# Patient Record
Sex: Female | Born: 1955 | Race: White | Hispanic: No | Marital: Single | Smoking: Former smoker
Health system: Southern US, Community
[De-identification: ages and names within clinical notes are randomized; demographics above are authoritative.]

## PROBLEM LIST (undated history)

## (undated) DIAGNOSIS — D649 Anemia, unspecified: Secondary | ICD-10-CM

## (undated) DIAGNOSIS — J449 Chronic obstructive pulmonary disease, unspecified: Secondary | ICD-10-CM

## (undated) DIAGNOSIS — F333 Major depressive disorder, recurrent, severe with psychotic symptoms: Secondary | ICD-10-CM

## (undated) DIAGNOSIS — M150 Primary generalized (osteo)arthritis: Secondary | ICD-10-CM

## (undated) DIAGNOSIS — Z9981 Dependence on supplemental oxygen: Secondary | ICD-10-CM

## (undated) DIAGNOSIS — E782 Mixed hyperlipidemia: Secondary | ICD-10-CM

## (undated) DIAGNOSIS — E039 Hypothyroidism, unspecified: Secondary | ICD-10-CM

## (undated) DIAGNOSIS — J4489 Other specified chronic obstructive pulmonary disease: Secondary | ICD-10-CM

## (undated) DIAGNOSIS — I10 Essential (primary) hypertension: Secondary | ICD-10-CM

## (undated) DIAGNOSIS — E119 Type 2 diabetes mellitus without complications: Secondary | ICD-10-CM

## (undated) DIAGNOSIS — I509 Heart failure, unspecified: Secondary | ICD-10-CM

## (undated) DIAGNOSIS — I872 Venous insufficiency (chronic) (peripheral): Secondary | ICD-10-CM

## (undated) DIAGNOSIS — G4733 Obstructive sleep apnea (adult) (pediatric): Secondary | ICD-10-CM

## (undated) DIAGNOSIS — I259 Chronic ischemic heart disease, unspecified: Secondary | ICD-10-CM

## (undated) DIAGNOSIS — M109 Gout, unspecified: Secondary | ICD-10-CM

## (undated) HISTORY — DX: Mixed hyperlipidemia: E78.2

## (undated) HISTORY — DX: Gout, unspecified: M10.9

## (undated) HISTORY — DX: Primary generalized (osteo)arthritis: M15.0

## (undated) HISTORY — DX: Chronic obstructive pulmonary disease, unspecified: J44.9

## (undated) HISTORY — DX: Anemia, unspecified: D64.9

## (undated) HISTORY — DX: Hypothyroidism, unspecified: E03.9

## (undated) HISTORY — DX: Dependence on supplemental oxygen: Z99.81

## (undated) HISTORY — DX: Chronic ischemic heart disease, unspecified: I25.9

## (undated) HISTORY — DX: Venous insufficiency (chronic) (peripheral): I87.2

## (undated) HISTORY — DX: Other specified chronic obstructive pulmonary disease: J44.89

## (undated) HISTORY — DX: Major depressive disorder, recurrent, severe with psychotic symptoms: F33.3

## (undated) HISTORY — DX: Obstructive sleep apnea (adult) (pediatric): G47.33

## (undated) HISTORY — PX: TONSILLECTOMY: SUR1361

---

## 2004-10-16 ENCOUNTER — Ambulatory Visit: Payer: Self-pay | Admitting: Oncology

## 2004-12-18 ENCOUNTER — Ambulatory Visit: Payer: Self-pay | Admitting: Internal Medicine

## 2004-12-21 ENCOUNTER — Ambulatory Visit: Payer: Self-pay | Admitting: Internal Medicine

## 2005-01-03 ENCOUNTER — Ambulatory Visit: Payer: Self-pay | Admitting: Oncology

## 2005-03-06 ENCOUNTER — Ambulatory Visit: Payer: Self-pay | Admitting: Oncology

## 2005-04-10 ENCOUNTER — Ambulatory Visit: Payer: Self-pay

## 2005-04-17 ENCOUNTER — Ambulatory Visit: Payer: Self-pay | Admitting: Oncology

## 2005-06-07 ENCOUNTER — Inpatient Hospital Stay: Payer: Self-pay | Admitting: Internal Medicine

## 2005-06-19 ENCOUNTER — Ambulatory Visit: Payer: Self-pay | Admitting: Oncology

## 2005-07-11 LAB — CBC WITH DIFFERENTIAL (CANCER CENTER ONLY)
BASO%: 0.6 % (ref 0.0–2.0)
EOS%: 2.1 % (ref 0.0–7.0)
LYMPH#: 0.8 10*3/uL — ABNORMAL LOW (ref 0.9–3.3)
MCH: 28.3 pg (ref 26.0–34.0)
MCHC: 33 g/dL (ref 32.0–36.0)
MONO%: 5.8 % (ref 0.0–13.0)
NEUT#: 4.3 10*3/uL (ref 1.5–6.5)
RDW: 16.1 % — ABNORMAL HIGH (ref 10.5–14.6)

## 2005-08-16 ENCOUNTER — Ambulatory Visit: Payer: Self-pay | Admitting: Oncology

## 2005-08-30 LAB — CBC WITH DIFFERENTIAL (CANCER CENTER ONLY)
Eosinophils Absolute: 0.2 10*3/uL (ref 0.0–0.5)
HCT: 31.3 % — ABNORMAL LOW (ref 34.8–46.6)
LYMPH%: 13.6 % — ABNORMAL LOW (ref 14.0–48.0)
MCV: 88 fL (ref 81–101)
MONO#: 0.2 10*3/uL (ref 0.1–0.9)
Platelets: 145 10*3/uL (ref 145–400)
RBC: 3.56 10*6/uL — ABNORMAL LOW (ref 3.70–5.32)
WBC: 6.5 10*3/uL (ref 3.9–10.0)

## 2005-09-20 LAB — CBC WITH DIFFERENTIAL (CANCER CENTER ONLY)
BASO#: 0 10*3/uL (ref 0.0–0.2)
BASO%: 0.3 % (ref 0.0–2.0)
Eosinophils Absolute: 0.1 10*3/uL (ref 0.0–0.5)
HCT: 28 % — ABNORMAL LOW (ref 34.8–46.6)
LYMPH%: 14 % (ref 14.0–48.0)
MCV: 85 fL (ref 81–101)
MONO#: 0.2 10*3/uL (ref 0.1–0.9)
NEUT%: 79.5 % (ref 39.6–80.0)
RDW: 17.5 % — ABNORMAL HIGH (ref 10.5–14.6)
WBC: 5 10*3/uL (ref 3.9–10.0)

## 2005-10-10 ENCOUNTER — Ambulatory Visit: Payer: Self-pay | Admitting: Oncology

## 2005-10-11 LAB — CBC WITH DIFFERENTIAL (CANCER CENTER ONLY)
BASO#: 0 10*3/uL (ref 0.0–0.2)
BASO%: 0.4 % (ref 0.0–2.0)
EOS%: 3.2 % (ref 0.0–7.0)
HGB: 9.9 g/dL — ABNORMAL LOW (ref 11.6–15.9)
MCH: 27.1 pg (ref 26.0–34.0)
MCHC: 32.1 g/dL (ref 32.0–36.0)
MONO%: 5.3 % (ref 0.0–13.0)
NEUT#: 4 10*3/uL (ref 1.5–6.5)
NEUT%: 76 % (ref 39.6–80.0)
RDW: 17.2 % — ABNORMAL HIGH (ref 10.5–14.6)

## 2005-11-01 LAB — CBC WITH DIFFERENTIAL (CANCER CENTER ONLY)
EOS%: 2.2 % (ref 0.0–7.0)
MCH: 27.1 pg (ref 26.0–34.0)
MCHC: 31.8 g/dL — ABNORMAL LOW (ref 32.0–36.0)
MONO%: 3.9 % (ref 0.0–13.0)
NEUT#: 4.5 10*3/uL (ref 1.5–6.5)
Platelets: 160 10*3/uL (ref 145–400)
RBC: 3.62 10*6/uL — ABNORMAL LOW (ref 3.70–5.32)

## 2005-11-21 LAB — LACTATE DEHYDROGENASE: LDH: 169 U/L (ref 94–250)

## 2005-11-21 LAB — CBC WITH DIFFERENTIAL (CANCER CENTER ONLY)
Eosinophils Absolute: 0.1 10*3/uL (ref 0.0–0.5)
LYMPH#: 0.6 10*3/uL — ABNORMAL LOW (ref 0.9–3.3)
MONO#: 0.2 10*3/uL (ref 0.1–0.9)
MONO%: 3.3 % (ref 0.0–13.0)
NEUT#: 3.9 10*3/uL (ref 1.5–6.5)
Platelets: 142 10*3/uL — ABNORMAL LOW (ref 145–400)
RBC: 3.55 10*6/uL — ABNORMAL LOW (ref 3.70–5.32)
WBC: 4.8 10*3/uL (ref 3.9–10.0)

## 2005-11-21 LAB — BASIC METABOLIC PANEL
BUN: 15 mg/dL (ref 6–23)
Calcium: 8.6 mg/dL (ref 8.4–10.5)
Creatinine, Ser: 0.66 mg/dL (ref 0.40–1.20)
Potassium: 4 mEq/L (ref 3.5–5.3)

## 2005-11-21 LAB — IRON AND TIBC
%SAT: 11 % — ABNORMAL LOW (ref 20–55)
Iron: 43 ug/dL (ref 42–145)

## 2005-12-10 ENCOUNTER — Ambulatory Visit: Payer: Self-pay | Admitting: Oncology

## 2005-12-17 LAB — CBC WITH DIFFERENTIAL (CANCER CENTER ONLY)
BASO#: 0 10*3/uL (ref 0.0–0.2)
EOS%: 1 % (ref 0.0–7.0)
HCT: 37.2 % (ref 34.8–46.6)
HGB: 12 g/dL (ref 11.6–15.9)
LYMPH#: 1 10*3/uL (ref 0.9–3.3)
LYMPH%: 10.1 % — ABNORMAL LOW (ref 14.0–48.0)
MCH: 27.4 pg (ref 26.0–34.0)
MCHC: 32.3 g/dL (ref 32.0–36.0)
MCV: 85 fL (ref 81–101)
NEUT%: 83.9 % — ABNORMAL HIGH (ref 39.6–80.0)

## 2006-01-03 LAB — CBC WITH DIFFERENTIAL (CANCER CENTER ONLY)
BASO#: 0 10*3/uL (ref 0.0–0.2)
EOS%: 2.5 % (ref 0.0–7.0)
MCH: 28.1 pg (ref 26.0–34.0)
MCHC: 33.3 g/dL (ref 32.0–36.0)
MONO%: 4.9 % (ref 0.0–13.0)
NEUT#: 3.5 10*3/uL (ref 1.5–6.5)
Platelets: 142 10*3/uL — ABNORMAL LOW (ref 145–400)
RDW: 16.9 % — ABNORMAL HIGH (ref 10.5–14.6)

## 2006-01-24 LAB — CBC WITH DIFFERENTIAL (CANCER CENTER ONLY)
BASO#: 0.1 10*3/uL (ref 0.0–0.2)
BASO%: 0.7 % (ref 0.0–2.0)
Eosinophils Absolute: 0.1 10*3/uL (ref 0.0–0.5)
HCT: 37.5 % (ref 34.8–46.6)
HGB: 12.4 g/dL (ref 11.6–15.9)
LYMPH#: 1.4 10*3/uL (ref 0.9–3.3)
LYMPH%: 14.6 % (ref 14.0–48.0)
MCV: 86 fL (ref 81–101)
MONO#: 0.6 10*3/uL (ref 0.1–0.9)
NEUT%: 77 % (ref 39.6–80.0)
WBC: 9.3 10*3/uL (ref 3.9–10.0)

## 2006-02-13 ENCOUNTER — Ambulatory Visit: Payer: Self-pay | Admitting: Oncology

## 2006-02-14 LAB — CBC WITH DIFFERENTIAL (CANCER CENTER ONLY)
BASO#: 0 10*3/uL (ref 0.0–0.2)
EOS%: 1.8 % (ref 0.0–7.0)
HCT: 32.9 % — ABNORMAL LOW (ref 34.8–46.6)
HGB: 11.1 g/dL — ABNORMAL LOW (ref 11.6–15.9)
LYMPH%: 18.4 % (ref 14.0–48.0)
MCH: 29.5 pg (ref 26.0–34.0)
MCHC: 33.8 g/dL (ref 32.0–36.0)
MONO%: 4.1 % (ref 0.0–13.0)
NEUT%: 75.3 % (ref 39.6–80.0)

## 2006-02-15 LAB — IRON AND TIBC
TIBC: 370 ug/dL (ref 250–470)
UIBC: 293 ug/dL

## 2006-03-28 LAB — CBC WITH DIFFERENTIAL (CANCER CENTER ONLY)
BASO%: 0.6 % (ref 0.0–2.0)
EOS%: 1.9 % (ref 0.0–7.0)
HCT: 32.8 % — ABNORMAL LOW (ref 34.8–46.6)
LYMPH%: 14.6 % (ref 14.0–48.0)
MCH: 31.1 pg (ref 26.0–34.0)
MCHC: 34 g/dL (ref 32.0–36.0)
MCV: 91 fL (ref 81–101)
MONO%: 6.5 % (ref 0.0–13.0)
NEUT%: 76.4 % (ref 39.6–80.0)
RDW: 15.8 % — ABNORMAL HIGH (ref 10.5–14.6)

## 2006-04-17 ENCOUNTER — Ambulatory Visit: Payer: Self-pay | Admitting: Oncology

## 2006-04-18 LAB — CBC WITH DIFFERENTIAL (CANCER CENTER ONLY)
BASO#: 0.1 10*3/uL (ref 0.0–0.2)
EOS%: 2.6 % (ref 0.0–7.0)
HGB: 11 g/dL — ABNORMAL LOW (ref 11.6–15.9)
LYMPH#: 1 10*3/uL (ref 0.9–3.3)
MCHC: 33.2 g/dL (ref 32.0–36.0)
MONO%: 5.4 % (ref 0.0–13.0)
NEUT#: 4.3 10*3/uL (ref 1.5–6.5)
Platelets: 202 10*3/uL (ref 145–400)
RBC: 3.57 10*6/uL — ABNORMAL LOW (ref 3.70–5.32)

## 2006-05-30 LAB — CBC WITH DIFFERENTIAL (CANCER CENTER ONLY)
BASO#: 0.1 10*3/uL (ref 0.0–0.2)
Eosinophils Absolute: 0.2 10*3/uL (ref 0.0–0.5)
HGB: 11.4 g/dL — ABNORMAL LOW (ref 11.6–15.9)
LYMPH#: 1.7 10*3/uL (ref 0.9–3.3)
MCH: 30.8 pg (ref 26.0–34.0)
MONO#: 0.4 10*3/uL (ref 0.1–0.9)
MONO%: 4.6 % (ref 0.0–13.0)
NEUT#: 6.6 10*3/uL — ABNORMAL HIGH (ref 1.5–6.5)
Platelets: 205 10*3/uL (ref 145–400)
RBC: 3.69 10*6/uL — ABNORMAL LOW (ref 3.70–5.32)
WBC: 9 10*3/uL (ref 3.9–10.0)

## 2006-06-18 ENCOUNTER — Ambulatory Visit: Payer: Self-pay | Admitting: Oncology

## 2006-07-17 LAB — CBC WITH DIFFERENTIAL (CANCER CENTER ONLY)
BASO#: 0.1 10*3/uL (ref 0.0–0.2)
EOS%: 2.7 % (ref 0.0–7.0)
MCH: 29.7 pg (ref 26.0–34.0)
MCHC: 34.2 g/dL (ref 32.0–36.0)
MONO%: 5.4 % (ref 0.0–13.0)
NEUT#: 6 10*3/uL (ref 1.5–6.5)
Platelets: 208 10*3/uL (ref 145–400)
RDW: 15.1 % — ABNORMAL HIGH (ref 10.5–14.6)

## 2006-08-06 ENCOUNTER — Ambulatory Visit: Payer: Self-pay | Admitting: Oncology

## 2006-08-07 LAB — CBC WITH DIFFERENTIAL (CANCER CENTER ONLY)
EOS%: 2.2 % (ref 0.0–7.0)
Eosinophils Absolute: 0.2 10*3/uL (ref 0.0–0.5)
MCH: 29.3 pg (ref 26.0–34.0)
MONO%: 4.5 % (ref 0.0–13.0)
NEUT#: 5.1 10*3/uL (ref 1.5–6.5)
Platelets: 180 10*3/uL (ref 145–400)
RBC: 3.36 10*6/uL — ABNORMAL LOW (ref 3.70–5.32)

## 2006-08-27 ENCOUNTER — Emergency Department: Payer: Self-pay | Admitting: Internal Medicine

## 2006-09-01 LAB — CBC WITH DIFFERENTIAL (CANCER CENTER ONLY)
Eosinophils Absolute: 0.2 10*3/uL (ref 0.0–0.5)
LYMPH#: 1.3 10*3/uL (ref 0.9–3.3)
MCV: 88 fL (ref 81–101)
MONO#: 0.4 10*3/uL (ref 0.1–0.9)
NEUT#: 5.1 10*3/uL (ref 1.5–6.5)
Platelets: 212 10*3/uL (ref 145–400)
RBC: 3.5 10*6/uL — ABNORMAL LOW (ref 3.70–5.32)
WBC: 7 10*3/uL (ref 3.9–10.0)

## 2006-10-08 ENCOUNTER — Encounter: Payer: Self-pay | Admitting: Internal Medicine

## 2006-10-08 ENCOUNTER — Ambulatory Visit: Payer: Self-pay | Admitting: Oncology

## 2006-10-09 LAB — CBC WITH DIFFERENTIAL (CANCER CENTER ONLY)
BASO#: 0.1 10*3/uL (ref 0.0–0.2)
HCT: 32.9 % — ABNORMAL LOW (ref 34.8–46.6)
HGB: 11 g/dL — ABNORMAL LOW (ref 11.6–15.9)
LYMPH#: 1.1 10*3/uL (ref 0.9–3.3)
MCHC: 33.3 g/dL (ref 32.0–36.0)
MCV: 88 fL (ref 81–101)
MONO#: 0.3 10*3/uL (ref 0.1–0.9)
NEUT%: 76.8 % (ref 39.6–80.0)
WBC: 6.6 10*3/uL (ref 3.9–10.0)

## 2006-10-27 ENCOUNTER — Encounter: Payer: Self-pay | Admitting: Internal Medicine

## 2006-10-30 LAB — CBC WITH DIFFERENTIAL (CANCER CENTER ONLY)
BASO#: 0 10*3/uL (ref 0.0–0.2)
EOS%: 2.3 % (ref 0.0–7.0)
HCT: 32.2 % — ABNORMAL LOW (ref 34.8–46.6)
HGB: 10.8 g/dL — ABNORMAL LOW (ref 11.6–15.9)
LYMPH%: 19.2 % (ref 14.0–48.0)
MCH: 29.4 pg (ref 26.0–34.0)
MCHC: 33.5 g/dL (ref 32.0–36.0)
MONO%: 4.7 % (ref 0.0–13.0)
NEUT%: 73.4 % (ref 39.6–80.0)

## 2006-12-10 ENCOUNTER — Ambulatory Visit: Payer: Self-pay | Admitting: Oncology

## 2007-01-01 LAB — CBC WITH DIFFERENTIAL (CANCER CENTER ONLY)
BASO#: 0.1 10*3/uL (ref 0.0–0.2)
BASO%: 0.8 % (ref 0.0–2.0)
EOS%: 2.3 % (ref 0.0–7.0)
HCT: 26.4 % — ABNORMAL LOW (ref 34.8–46.6)
LYMPH#: 1 10*3/uL (ref 0.9–3.3)
LYMPH%: 17.1 % (ref 14.0–48.0)
MCH: 29.3 pg (ref 26.0–34.0)
MCHC: 33.6 g/dL (ref 32.0–36.0)
MONO%: 3.9 % (ref 0.0–13.0)
NEUT%: 75.9 % (ref 39.6–80.0)
RDW: 15 % — ABNORMAL HIGH (ref 10.5–14.6)

## 2007-01-21 LAB — CBC WITH DIFFERENTIAL (CANCER CENTER ONLY)
BASO%: 0.6 % (ref 0.0–2.0)
EOS%: 2.7 % (ref 0.0–7.0)
HGB: 10 g/dL — ABNORMAL LOW (ref 11.6–15.9)
LYMPH#: 1 10*3/uL (ref 0.9–3.3)
MCH: 28.8 pg (ref 26.0–34.0)
MCHC: 32.7 g/dL (ref 32.0–36.0)
MONO%: 5.6 % (ref 0.0–13.0)
NEUT#: 4.9 10*3/uL (ref 1.5–6.5)
Platelets: 192 10*3/uL (ref 145–400)
RDW: 16 % — ABNORMAL HIGH (ref 10.5–14.6)

## 2007-02-04 ENCOUNTER — Ambulatory Visit: Payer: Self-pay | Admitting: Oncology

## 2007-03-02 LAB — CBC WITH DIFFERENTIAL (CANCER CENTER ONLY)
Eosinophils Absolute: 0.1 10*3/uL (ref 0.0–0.5)
HCT: 32.5 % — ABNORMAL LOW (ref 34.8–46.6)
HGB: 10.9 g/dL — ABNORMAL LOW (ref 11.6–15.9)
LYMPH%: 15.5 % (ref 14.0–48.0)
MCV: 86 fL (ref 81–101)
MONO#: 0.4 10*3/uL (ref 0.1–0.9)
NEUT%: 76.5 % (ref 39.6–80.0)
RBC: 3.76 10*6/uL (ref 3.70–5.32)
WBC: 6.6 10*3/uL (ref 3.9–10.0)

## 2007-03-02 LAB — IRON AND TIBC
%SAT: 21 % (ref 20–55)
TIBC: 315 ug/dL (ref 250–470)

## 2007-03-02 LAB — FERRITIN: Ferritin: 83 ng/mL (ref 10–291)

## 2007-03-18 ENCOUNTER — Ambulatory Visit: Payer: Self-pay | Admitting: Oncology

## 2007-03-23 LAB — CBC WITH DIFFERENTIAL (CANCER CENTER ONLY)
BASO#: 0 10*3/uL (ref 0.0–0.2)
BASO%: 0.5 % (ref 0.0–2.0)
EOS%: 2.2 % (ref 0.0–7.0)
HCT: 31.3 % — ABNORMAL LOW (ref 34.8–46.6)
HGB: 10.4 g/dL — ABNORMAL LOW (ref 11.6–15.9)
LYMPH%: 14.8 % (ref 14.0–48.0)
MCHC: 33 g/dL (ref 32.0–36.0)
MCV: 87 fL (ref 81–101)
MONO#: 0.4 10*3/uL (ref 0.1–0.9)
NEUT%: 77.1 % (ref 39.6–80.0)
RDW: 15.9 % — ABNORMAL HIGH (ref 10.5–14.6)

## 2007-04-20 ENCOUNTER — Ambulatory Visit: Payer: Self-pay | Admitting: Family Medicine

## 2007-04-30 ENCOUNTER — Ambulatory Visit: Payer: Self-pay | Admitting: Oncology

## 2007-05-04 LAB — CBC WITH DIFFERENTIAL (CANCER CENTER ONLY)
BASO%: 1 % (ref 0.0–2.0)
Eosinophils Absolute: 0.2 10*3/uL (ref 0.0–0.5)
HCT: 31.3 % — ABNORMAL LOW (ref 34.8–46.6)
HGB: 10.5 g/dL — ABNORMAL LOW (ref 11.6–15.9)
LYMPH#: 1.1 10*3/uL (ref 0.9–3.3)
MCV: 86 fL (ref 81–101)
MONO#: 0.4 10*3/uL (ref 0.1–0.9)
NEUT%: 77.2 % (ref 39.6–80.0)
RBC: 3.65 10*6/uL — ABNORMAL LOW (ref 3.70–5.32)
RDW: 15.4 % — ABNORMAL HIGH (ref 10.5–14.6)
WBC: 7.8 10*3/uL (ref 3.9–10.0)

## 2007-06-11 ENCOUNTER — Ambulatory Visit: Payer: Self-pay | Admitting: Oncology

## 2007-06-15 LAB — CBC WITH DIFFERENTIAL (CANCER CENTER ONLY)
BASO#: 0 10*3/uL (ref 0.0–0.2)
EOS%: 2 % (ref 0.0–7.0)
HCT: 34.9 % (ref 34.8–46.6)
HGB: 11.5 g/dL — ABNORMAL LOW (ref 11.6–15.9)
LYMPH#: 1.1 10*3/uL (ref 0.9–3.3)
LYMPH%: 15.2 % (ref 14.0–48.0)
MCHC: 32.9 g/dL (ref 32.0–36.0)
MCV: 85 fL (ref 81–101)
MONO#: 0.3 10*3/uL (ref 0.1–0.9)
NEUT%: 78.2 % (ref 39.6–80.0)

## 2007-07-24 ENCOUNTER — Ambulatory Visit: Payer: Self-pay | Admitting: Oncology

## 2007-07-27 LAB — CBC WITH DIFFERENTIAL (CANCER CENTER ONLY)
BASO#: 0 10*3/uL (ref 0.0–0.2)
EOS%: 2.4 % (ref 0.0–7.0)
Eosinophils Absolute: 0.2 10*3/uL (ref 0.0–0.5)
HCT: 32.2 % — ABNORMAL LOW (ref 34.8–46.6)
HGB: 11 g/dL — ABNORMAL LOW (ref 11.6–15.9)
MCH: 29.1 pg (ref 26.0–34.0)
MCHC: 34 g/dL (ref 32.0–36.0)
MCV: 86 fL (ref 81–101)
MONO%: 5.3 % (ref 0.0–13.0)
NEUT%: 77.5 % (ref 39.6–80.0)
RBC: 3.76 10*6/uL (ref 3.70–5.32)

## 2007-08-17 LAB — CBC WITH DIFFERENTIAL (CANCER CENTER ONLY)
BASO#: 0 10*3/uL (ref 0.0–0.2)
Eosinophils Absolute: 0.1 10*3/uL (ref 0.0–0.5)
HCT: 33 % — ABNORMAL LOW (ref 34.8–46.6)
HGB: 11.4 g/dL — ABNORMAL LOW (ref 11.6–15.9)
LYMPH#: 1.1 10*3/uL (ref 0.9–3.3)
MCHC: 34.6 g/dL (ref 32.0–36.0)
MONO#: 0.3 10*3/uL (ref 0.1–0.9)
NEUT#: 4.7 10*3/uL (ref 1.5–6.5)
NEUT%: 76.2 % (ref 39.6–80.0)
RBC: 3.82 10*6/uL (ref 3.70–5.32)
WBC: 6.1 10*3/uL (ref 3.9–10.0)

## 2007-08-17 LAB — IRON AND TIBC: Iron: 67 ug/dL (ref 42–145)

## 2007-08-17 LAB — FERRITIN: Ferritin: 78 ng/mL (ref 10–291)

## 2007-09-03 ENCOUNTER — Ambulatory Visit: Payer: Self-pay | Admitting: Oncology

## 2007-09-07 LAB — CBC WITH DIFFERENTIAL (CANCER CENTER ONLY)
BASO%: 0.4 % (ref 0.0–2.0)
EOS%: 2.3 % (ref 0.0–7.0)
Eosinophils Absolute: 0.1 10*3/uL (ref 0.0–0.5)
LYMPH%: 16.7 % (ref 14.0–48.0)
MCH: 28.8 pg (ref 26.0–34.0)
MCV: 87 fL (ref 81–101)
MONO%: 4.7 % (ref 0.0–13.0)
NEUT#: 4.5 10*3/uL (ref 1.5–6.5)
Platelets: 157 10*3/uL (ref 145–400)
RBC: 3.76 10*6/uL (ref 3.70–5.32)
RDW: 16.2 % — ABNORMAL HIGH (ref 10.5–14.6)
WBC: 6 10*3/uL (ref 3.9–10.0)

## 2007-09-28 LAB — CBC WITH DIFFERENTIAL (CANCER CENTER ONLY)
BASO%: 0.4 % (ref 0.0–2.0)
EOS%: 1.8 % (ref 0.0–7.0)
LYMPH#: 1.1 10*3/uL (ref 0.9–3.3)
LYMPH%: 13.5 % — ABNORMAL LOW (ref 14.0–48.0)
MONO#: 0.4 10*3/uL (ref 0.1–0.9)
Platelets: 204 10*3/uL (ref 145–400)
RDW: 15.1 % — ABNORMAL HIGH (ref 10.5–14.6)
WBC: 8 10*3/uL (ref 3.9–10.0)

## 2007-10-16 ENCOUNTER — Ambulatory Visit: Payer: Self-pay | Admitting: Oncology

## 2007-10-19 LAB — CBC WITH DIFFERENTIAL (CANCER CENTER ONLY)
BASO#: 0 10*3/uL (ref 0.0–0.2)
BASO%: 0.4 % (ref 0.0–2.0)
EOS%: 2.1 % (ref 0.0–7.0)
Eosinophils Absolute: 0.2 10*3/uL (ref 0.0–0.5)
HCT: 33.2 % — ABNORMAL LOW (ref 34.8–46.6)
HGB: 11 g/dL — ABNORMAL LOW (ref 11.6–15.9)
LYMPH#: 1.1 10*3/uL (ref 0.9–3.3)
LYMPH%: 14.7 % (ref 14.0–48.0)
MCH: 28.8 pg (ref 26.0–34.0)
MCHC: 33.2 g/dL (ref 32.0–36.0)
MCV: 87 fL (ref 81–101)
MONO#: 0.3 10*3/uL (ref 0.1–0.9)
MONO%: 4 % (ref 0.0–13.0)
NEUT#: 5.7 10*3/uL (ref 1.5–6.5)
NEUT%: 78.8 % (ref 39.6–80.0)
Platelets: 179 10*3/uL (ref 145–400)
RBC: 3.83 10*6/uL (ref 3.70–5.32)
RDW: 15.7 % — ABNORMAL HIGH (ref 10.5–14.6)
WBC: 7.2 10*3/uL (ref 3.9–10.0)

## 2007-11-09 LAB — CBC WITH DIFFERENTIAL (CANCER CENTER ONLY)
BASO%: 0.5 % (ref 0.0–2.0)
HCT: 35.1 % (ref 34.8–46.6)
LYMPH#: 1 10*3/uL (ref 0.9–3.3)
MONO#: 0.3 10*3/uL (ref 0.1–0.9)
NEUT#: 4.2 10*3/uL (ref 1.5–6.5)
NEUT%: 73.9 % (ref 39.6–80.0)
RDW: 15.7 % — ABNORMAL HIGH (ref 10.5–14.6)
WBC: 5.6 10*3/uL (ref 3.9–10.0)

## 2007-11-30 ENCOUNTER — Ambulatory Visit: Payer: Self-pay | Admitting: Oncology

## 2007-12-01 LAB — CBC WITH DIFFERENTIAL (CANCER CENTER ONLY)
BASO#: 0 10*3/uL (ref 0.0–0.2)
EOS%: 2.6 % (ref 0.0–7.0)
HGB: 12.3 g/dL (ref 11.6–15.9)
MCH: 29.7 pg (ref 26.0–34.0)
MCHC: 34.2 g/dL (ref 32.0–36.0)
MONO%: 4.3 % (ref 0.0–13.0)
NEUT#: 4.1 10*3/uL (ref 1.5–6.5)
Platelets: 171 10*3/uL (ref 145–400)
RDW: 15.8 % — ABNORMAL HIGH (ref 10.5–14.6)

## 2007-12-21 LAB — CBC WITH DIFFERENTIAL (CANCER CENTER ONLY)
BASO#: 0.1 10*3/uL (ref 0.0–0.2)
EOS%: 2 % (ref 0.0–7.0)
Eosinophils Absolute: 0.1 10*3/uL (ref 0.0–0.5)
LYMPH%: 18.5 % (ref 14.0–48.0)
MCH: 29.6 pg (ref 26.0–34.0)
MCHC: 34.1 g/dL (ref 32.0–36.0)
MCV: 87 fL (ref 81–101)
MONO%: 4.3 % (ref 0.0–13.0)
NEUT#: 5.1 10*3/uL (ref 1.5–6.5)
Platelets: 165 10*3/uL (ref 145–400)

## 2008-01-11 LAB — CBC WITH DIFFERENTIAL (CANCER CENTER ONLY)
BASO#: 0 10*3/uL (ref 0.0–0.2)
EOS%: 1.9 % (ref 0.0–7.0)
HCT: 31.7 % — ABNORMAL LOW (ref 34.8–46.6)
HGB: 10.9 g/dL — ABNORMAL LOW (ref 11.6–15.9)
LYMPH%: 14.7 % (ref 14.0–48.0)
MCH: 30.1 pg (ref 26.0–34.0)
MCHC: 34.4 g/dL (ref 32.0–36.0)
MCV: 88 fL (ref 81–101)
MONO%: 4.6 % (ref 0.0–13.0)
NEUT%: 78.3 % (ref 39.6–80.0)

## 2008-01-28 ENCOUNTER — Ambulatory Visit: Payer: Self-pay | Admitting: Oncology

## 2008-02-22 LAB — CBC WITH DIFFERENTIAL (CANCER CENTER ONLY)
BASO#: 0.1 10*3/uL (ref 0.0–0.2)
HCT: 32 % — ABNORMAL LOW (ref 34.8–46.6)
HGB: 10.8 g/dL — ABNORMAL LOW (ref 11.6–15.9)
LYMPH#: 1.2 10*3/uL (ref 0.9–3.3)
LYMPH%: 15.9 % (ref 14.0–48.0)
MCHC: 33.9 g/dL (ref 32.0–36.0)
MCV: 90 fL (ref 81–101)
MONO#: 0.3 10*3/uL (ref 0.1–0.9)
NEUT%: 75.8 % (ref 39.6–80.0)
WBC: 7.6 10*3/uL (ref 3.9–10.0)

## 2008-03-10 ENCOUNTER — Ambulatory Visit: Payer: Self-pay | Admitting: Oncology

## 2008-04-04 LAB — CBC WITH DIFFERENTIAL (CANCER CENTER ONLY)
BASO%: 0.5 % (ref 0.0–2.0)
Eosinophils Absolute: 0.2 10*3/uL (ref 0.0–0.5)
LYMPH#: 1.2 10*3/uL (ref 0.9–3.3)
LYMPH%: 18.2 % (ref 14.0–48.0)
MCV: 90 fL (ref 81–101)
MONO#: 0.3 10*3/uL (ref 0.1–0.9)
NEUT#: 5 10*3/uL (ref 1.5–6.5)
Platelets: 220 10*3/uL (ref 145–400)
RBC: 3.76 10*6/uL (ref 3.70–5.32)
RDW: 14.4 % (ref 10.5–14.6)
WBC: 6.8 10*3/uL (ref 3.9–10.0)

## 2008-04-25 ENCOUNTER — Ambulatory Visit: Payer: Self-pay | Admitting: Oncology

## 2008-04-25 LAB — CBC WITH DIFFERENTIAL (CANCER CENTER ONLY)
BASO%: 0.4 % (ref 0.0–2.0)
EOS%: 3.2 % (ref 0.0–7.0)
Eosinophils Absolute: 0.2 10*3/uL (ref 0.0–0.5)
LYMPH%: 16.7 % (ref 14.0–48.0)
MCH: 29.5 pg (ref 26.0–34.0)
MCHC: 33.1 g/dL (ref 32.0–36.0)
MCV: 89 fL (ref 81–101)
MONO%: 5 % (ref 0.0–13.0)
NEUT#: 5.1 10*3/uL (ref 1.5–6.5)
Platelets: 196 10*3/uL (ref 145–400)
RBC: 3.83 10*6/uL (ref 3.70–5.32)
RDW: 14.3 % (ref 10.5–14.6)

## 2008-05-19 LAB — CBC WITH DIFFERENTIAL (CANCER CENTER ONLY)
BASO%: 0.7 % (ref 0.0–2.0)
Eosinophils Absolute: 0.2 10*3/uL (ref 0.0–0.5)
HCT: 34 % — ABNORMAL LOW (ref 34.8–46.6)
LYMPH#: 1 10*3/uL (ref 0.9–3.3)
MCV: 87 fL (ref 81–101)
MONO#: 0.3 10*3/uL (ref 0.1–0.9)
NEUT%: 79.7 % (ref 39.6–80.0)
RBC: 3.89 10*6/uL (ref 3.70–5.32)
RDW: 14.4 % (ref 10.5–14.6)
WBC: 7.8 10*3/uL (ref 3.9–10.0)

## 2008-06-06 LAB — CBC WITH DIFFERENTIAL (CANCER CENTER ONLY)
BASO#: 0 10e3/uL (ref 0.0–0.2)
BASO%: 0.5 % (ref 0.0–2.0)
EOS%: 2.4 % (ref 0.0–7.0)
Eosinophils Absolute: 0.2 10e3/uL (ref 0.0–0.5)
HCT: 32.9 % — ABNORMAL LOW (ref 34.8–46.6)
HGB: 11 g/dL — ABNORMAL LOW (ref 11.6–15.9)
LYMPH#: 0.9 10e3/uL (ref 0.9–3.3)
LYMPH%: 14.1 % (ref 14.0–48.0)
MCH: 28.9 pg (ref 26.0–34.0)
MCHC: 33.4 g/dL (ref 32.0–36.0)
MCV: 87 fL (ref 81–101)
MONO#: 0.3 10e3/uL (ref 0.1–0.9)
MONO%: 4.3 % (ref 0.0–13.0)
NEUT#: 5 10e3/uL (ref 1.5–6.5)
NEUT%: 78.7 % (ref 39.6–80.0)
Platelets: 146 10e3/uL (ref 145–400)
RBC: 3.8 10e6/uL (ref 3.70–5.32)
RDW: 14.6 % (ref 10.5–14.6)
WBC: 6.4 10e3/uL (ref 3.9–10.0)

## 2008-06-23 ENCOUNTER — Ambulatory Visit: Payer: Self-pay | Admitting: Oncology

## 2008-07-18 LAB — CBC WITH DIFFERENTIAL (CANCER CENTER ONLY)
BASO%: 0.5 % (ref 0.0–2.0)
LYMPH#: 0.9 10*3/uL (ref 0.9–3.3)
LYMPH%: 14.3 % (ref 14.0–48.0)
MCV: 85 fL (ref 81–101)
MONO#: 0.3 10*3/uL (ref 0.1–0.9)
NEUT#: 4.8 10*3/uL (ref 1.5–6.5)
Platelets: 173 10*3/uL (ref 145–400)
RDW: 16.9 % — ABNORMAL HIGH (ref 10.5–14.6)
WBC: 6.1 10*3/uL (ref 3.9–10.0)

## 2008-08-05 ENCOUNTER — Ambulatory Visit: Payer: Self-pay | Admitting: Oncology

## 2008-08-08 LAB — CBC WITH DIFFERENTIAL (CANCER CENTER ONLY)
BASO%: 0.3 % (ref 0.0–2.0)
EOS%: 1.8 % (ref 0.0–7.0)
LYMPH%: 15.3 % (ref 14.0–48.0)
MCH: 30.1 pg (ref 26.0–34.0)
MCHC: 34.6 g/dL (ref 32.0–36.0)
MCV: 87 fL (ref 81–101)
MONO%: 4.9 % (ref 0.0–13.0)
Platelets: 197 10*3/uL (ref 145–400)
RDW: 16.4 % — ABNORMAL HIGH (ref 10.5–14.6)

## 2008-08-08 LAB — IRON AND TIBC
Iron: 48 ug/dL (ref 42–145)
UIBC: 233 ug/dL

## 2008-08-30 ENCOUNTER — Ambulatory Visit: Payer: Self-pay | Admitting: Pain Medicine

## 2008-09-15 ENCOUNTER — Ambulatory Visit: Payer: Self-pay | Admitting: Oncology

## 2008-10-27 ENCOUNTER — Ambulatory Visit: Payer: Self-pay | Admitting: Oncology

## 2008-12-08 ENCOUNTER — Ambulatory Visit: Payer: Self-pay | Admitting: Oncology

## 2008-12-12 LAB — CBC WITH DIFFERENTIAL (CANCER CENTER ONLY)
BASO#: 0 10*3/uL (ref 0.0–0.2)
EOS%: 2.5 % (ref 0.0–7.0)
Eosinophils Absolute: 0.2 10*3/uL (ref 0.0–0.5)
HGB: 10.1 g/dL — ABNORMAL LOW (ref 11.6–15.9)
LYMPH%: 18.5 % (ref 14.0–48.0)
MCH: 31.3 pg (ref 26.0–34.0)
MCHC: 33.4 g/dL (ref 32.0–36.0)
MCV: 94 fL (ref 81–101)
MONO%: 6.6 % (ref 0.0–13.0)
RBC: 3.22 10*6/uL — ABNORMAL LOW (ref 3.70–5.32)

## 2009-01-02 LAB — CBC WITH DIFFERENTIAL (CANCER CENTER ONLY)
BASO#: 0 10*3/uL (ref 0.0–0.2)
Eosinophils Absolute: 0.2 10*3/uL (ref 0.0–0.5)
HCT: 30.9 % — ABNORMAL LOW (ref 34.8–46.6)
HGB: 10.3 g/dL — ABNORMAL LOW (ref 11.6–15.9)
LYMPH#: 1.4 10*3/uL (ref 0.9–3.3)
MCH: 30.8 pg (ref 26.0–34.0)
MCHC: 33.3 g/dL (ref 32.0–36.0)
MONO%: 4.5 % (ref 0.0–13.0)
NEUT#: 6 10*3/uL (ref 1.5–6.5)
NEUT%: 75.2 % (ref 39.6–80.0)
RBC: 3.35 10*6/uL — ABNORMAL LOW (ref 3.70–5.32)

## 2009-01-16 ENCOUNTER — Ambulatory Visit: Payer: Self-pay | Admitting: Oncology

## 2009-02-09 ENCOUNTER — Ambulatory Visit: Payer: Self-pay | Admitting: Oncology

## 2009-03-29 ENCOUNTER — Ambulatory Visit: Payer: Self-pay | Admitting: Oncology

## 2009-04-18 LAB — CBC WITH DIFFERENTIAL (CANCER CENTER ONLY)
BASO#: 0 10*3/uL (ref 0.0–0.2)
BASO%: 0.4 % (ref 0.0–2.0)
Eosinophils Absolute: 0.2 10*3/uL (ref 0.0–0.5)
HCT: 28 % — ABNORMAL LOW (ref 34.8–46.6)
HGB: 9.3 g/dL — ABNORMAL LOW (ref 11.6–15.9)
LYMPH#: 1 10*3/uL (ref 0.9–3.3)
LYMPH%: 13.3 % — ABNORMAL LOW (ref 14.0–48.0)
MCV: 90 fL (ref 81–101)
MONO#: 0.3 10*3/uL (ref 0.1–0.9)
NEUT%: 80.5 % — ABNORMAL HIGH (ref 39.6–80.0)
RBC: 3.11 10*6/uL — ABNORMAL LOW (ref 3.70–5.32)
RDW: 16.7 % — ABNORMAL HIGH (ref 10.5–14.6)
WBC: 7.7 10*3/uL (ref 3.9–10.0)

## 2009-04-18 LAB — IRON AND TIBC
%SAT: 15 % — ABNORMAL LOW (ref 20–55)
Iron: 43 ug/dL (ref 42–145)
UIBC: 253 ug/dL

## 2009-04-18 LAB — CMP (CANCER CENTER ONLY)
ALT(SGPT): 35 U/L (ref 10–47)
CO2: 31 mEq/L (ref 18–33)
Calcium: 8.9 mg/dL (ref 8.0–10.3)
Chloride: 97 mEq/L — ABNORMAL LOW (ref 98–108)
Creat: 0.7 mg/dl (ref 0.6–1.2)
Glucose, Bld: 302 mg/dL — ABNORMAL HIGH (ref 73–118)
Total Protein: 7.6 g/dL (ref 6.4–8.1)

## 2009-05-10 ENCOUNTER — Ambulatory Visit: Payer: Self-pay | Admitting: Oncology

## 2009-05-12 LAB — CBC WITH DIFFERENTIAL (CANCER CENTER ONLY)
BASO%: 0.4 % (ref 0.0–2.0)
Eosinophils Absolute: 0.1 10*3/uL (ref 0.0–0.5)
LYMPH#: 0.8 10*3/uL — ABNORMAL LOW (ref 0.9–3.3)
MCV: 88 fL (ref 81–101)
MONO#: 0.3 10*3/uL (ref 0.1–0.9)
NEUT#: 5.4 10*3/uL (ref 1.5–6.5)
Platelets: 175 10*3/uL (ref 145–400)
RBC: 3.13 10*6/uL — ABNORMAL LOW (ref 3.70–5.32)
RDW: 16.6 % — ABNORMAL HIGH (ref 10.5–14.6)
WBC: 6.6 10*3/uL (ref 3.9–10.0)

## 2009-06-02 LAB — CBC WITH DIFFERENTIAL (CANCER CENTER ONLY)
BASO%: 0.5 % (ref 0.0–2.0)
Eosinophils Absolute: 0.2 10*3/uL (ref 0.0–0.5)
LYMPH%: 10.4 % — ABNORMAL LOW (ref 14.0–48.0)
MCH: 29 pg (ref 26.0–34.0)
MCV: 87 fL (ref 81–101)
MONO%: 3.4 % (ref 0.0–13.0)
NEUT#: 6.8 10*3/uL — ABNORMAL HIGH (ref 1.5–6.5)
Platelets: 175 10*3/uL (ref 145–400)
RBC: 3.33 10*6/uL — ABNORMAL LOW (ref 3.70–5.32)
RDW: 15.9 % — ABNORMAL HIGH (ref 10.5–14.6)
WBC: 8.2 10*3/uL (ref 3.9–10.0)

## 2009-06-15 ENCOUNTER — Ambulatory Visit: Payer: Self-pay | Admitting: Oncology

## 2009-07-10 ENCOUNTER — Ambulatory Visit: Payer: Self-pay | Admitting: Oncology

## 2009-08-04 LAB — CBC WITH DIFFERENTIAL (CANCER CENTER ONLY)
BASO#: 0 10*3/uL (ref 0.0–0.2)
BASO%: 0.4 % (ref 0.0–2.0)
Eosinophils Absolute: 0.1 10*3/uL (ref 0.0–0.5)
MCH: 29.3 pg (ref 26.0–34.0)
MCHC: 33.8 g/dL (ref 32.0–36.0)
MCV: 86 fL (ref 81–101)
NEUT%: 86.9 % — ABNORMAL HIGH (ref 39.6–80.0)
Platelets: 220 10*3/uL (ref 145–400)
RDW: 16.1 % — ABNORMAL HIGH (ref 10.5–14.6)

## 2009-08-04 LAB — IRON AND TIBC
Iron: 79 ug/dL (ref 42–145)
TIBC: 301 ug/dL (ref 250–470)
UIBC: 222 ug/dL

## 2009-09-13 ENCOUNTER — Ambulatory Visit: Payer: Self-pay | Admitting: Oncology

## 2009-09-22 LAB — CBC WITH DIFFERENTIAL (CANCER CENTER ONLY)
BASO#: 0 10*3/uL (ref 0.0–0.2)
Eosinophils Absolute: 0.1 10*3/uL (ref 0.0–0.5)
HGB: 9.7 g/dL — ABNORMAL LOW (ref 11.6–15.9)
LYMPH%: 14 % (ref 14.0–48.0)
MCH: 29.6 pg (ref 26.0–34.0)
MCV: 86 fL (ref 81–101)
MONO%: 5.4 % (ref 0.0–13.0)
NEUT%: 78.5 % (ref 39.6–80.0)
RBC: 3.28 10*6/uL — ABNORMAL LOW (ref 3.70–5.32)

## 2009-10-20 ENCOUNTER — Ambulatory Visit: Payer: Self-pay | Admitting: Oncology

## 2009-12-22 ENCOUNTER — Ambulatory Visit: Payer: Self-pay | Admitting: Oncology

## 2010-01-16 ENCOUNTER — Other Ambulatory Visit: Payer: Self-pay | Admitting: Internal Medicine

## 2010-01-17 ENCOUNTER — Ambulatory Visit: Payer: Self-pay | Admitting: Internal Medicine

## 2010-01-29 ENCOUNTER — Ambulatory Visit: Payer: Self-pay | Admitting: Internal Medicine

## 2010-01-31 ENCOUNTER — Ambulatory Visit: Payer: Self-pay | Admitting: Internal Medicine

## 2010-02-14 ENCOUNTER — Ambulatory Visit: Payer: Self-pay | Admitting: Internal Medicine

## 2010-02-25 ENCOUNTER — Ambulatory Visit: Payer: Self-pay | Admitting: Internal Medicine

## 2010-04-04 ENCOUNTER — Ambulatory Visit: Payer: Self-pay | Admitting: Internal Medicine

## 2010-04-26 ENCOUNTER — Encounter: Payer: Self-pay | Admitting: Cardiothoracic Surgery

## 2010-04-26 ENCOUNTER — Ambulatory Visit: Payer: Self-pay | Admitting: Internal Medicine

## 2010-05-27 ENCOUNTER — Encounter: Payer: Self-pay | Admitting: Cardiothoracic Surgery

## 2010-05-30 ENCOUNTER — Ambulatory Visit: Payer: Self-pay | Admitting: Internal Medicine

## 2010-06-05 ENCOUNTER — Encounter: Payer: Self-pay | Admitting: Cardiothoracic Surgery

## 2010-06-26 ENCOUNTER — Ambulatory Visit: Payer: Self-pay | Admitting: Internal Medicine

## 2010-06-26 ENCOUNTER — Encounter: Payer: Self-pay | Admitting: Cardiothoracic Surgery

## 2010-07-03 ENCOUNTER — Encounter: Payer: Self-pay | Admitting: Cardiothoracic Surgery

## 2010-07-12 ENCOUNTER — Emergency Department: Payer: Self-pay | Admitting: Emergency Medicine

## 2010-09-19 ENCOUNTER — Encounter: Payer: Self-pay | Admitting: Nurse Practitioner

## 2010-09-19 ENCOUNTER — Encounter: Payer: Self-pay | Admitting: Cardiothoracic Surgery

## 2011-03-04 ENCOUNTER — Encounter: Payer: Self-pay | Admitting: Nurse Practitioner

## 2011-03-04 ENCOUNTER — Encounter: Payer: Self-pay | Admitting: Cardiothoracic Surgery

## 2011-03-29 ENCOUNTER — Encounter: Payer: Self-pay | Admitting: Cardiothoracic Surgery

## 2011-03-29 ENCOUNTER — Encounter: Payer: Self-pay | Admitting: Nurse Practitioner

## 2011-04-26 ENCOUNTER — Encounter: Payer: Self-pay | Admitting: Cardiothoracic Surgery

## 2011-04-26 ENCOUNTER — Encounter: Payer: Self-pay | Admitting: Nurse Practitioner

## 2011-05-27 ENCOUNTER — Encounter: Payer: Self-pay | Admitting: Nurse Practitioner

## 2011-05-27 ENCOUNTER — Encounter: Payer: Self-pay | Admitting: Cardiothoracic Surgery

## 2011-06-26 ENCOUNTER — Encounter: Payer: Self-pay | Admitting: Nurse Practitioner

## 2011-06-26 ENCOUNTER — Encounter: Payer: Self-pay | Admitting: Cardiothoracic Surgery

## 2011-07-27 ENCOUNTER — Encounter: Payer: Self-pay | Admitting: Cardiothoracic Surgery

## 2011-07-27 ENCOUNTER — Encounter: Payer: Self-pay | Admitting: Nurse Practitioner

## 2011-08-26 ENCOUNTER — Encounter: Payer: Self-pay | Admitting: Cardiothoracic Surgery

## 2011-08-26 ENCOUNTER — Encounter: Payer: Self-pay | Admitting: Nurse Practitioner

## 2011-09-26 ENCOUNTER — Encounter: Payer: Self-pay | Admitting: Nurse Practitioner

## 2011-09-26 ENCOUNTER — Encounter: Payer: Self-pay | Admitting: Cardiothoracic Surgery

## 2011-10-27 ENCOUNTER — Encounter: Payer: Self-pay | Admitting: Nurse Practitioner

## 2011-10-27 ENCOUNTER — Encounter: Payer: Self-pay | Admitting: Cardiothoracic Surgery

## 2011-11-26 ENCOUNTER — Encounter: Payer: Self-pay | Admitting: Nurse Practitioner

## 2011-11-26 ENCOUNTER — Encounter: Payer: Self-pay | Admitting: Cardiothoracic Surgery

## 2011-12-17 ENCOUNTER — Ambulatory Visit: Payer: Self-pay | Admitting: Nurse Practitioner

## 2011-12-21 LAB — WOUND CULTURE

## 2011-12-27 ENCOUNTER — Encounter: Payer: Self-pay | Admitting: Nurse Practitioner

## 2011-12-27 ENCOUNTER — Encounter: Payer: Self-pay | Admitting: Cardiothoracic Surgery

## 2012-01-26 ENCOUNTER — Encounter: Payer: Self-pay | Admitting: Nurse Practitioner

## 2012-01-26 ENCOUNTER — Encounter: Payer: Self-pay | Admitting: Cardiothoracic Surgery

## 2012-02-03 ENCOUNTER — Ambulatory Visit: Payer: Self-pay | Admitting: Vascular Surgery

## 2012-02-03 LAB — BASIC METABOLIC PANEL
Anion Gap: 7 (ref 7–16)
BUN: 32 mg/dL — ABNORMAL HIGH (ref 7–18)
Creatinine: 1.12 mg/dL (ref 0.60–1.30)
EGFR (African American): >60
Glucose: 369 mg/dL — ABNORMAL HIGH (ref 65–99)
Sodium: 137 mmol/L (ref 136–145)

## 2012-02-26 ENCOUNTER — Encounter: Payer: Self-pay | Admitting: Cardiothoracic Surgery

## 2012-02-26 ENCOUNTER — Encounter: Payer: Self-pay | Admitting: Nurse Practitioner

## 2012-03-28 ENCOUNTER — Encounter: Payer: Self-pay | Admitting: Cardiothoracic Surgery

## 2012-03-28 ENCOUNTER — Encounter: Payer: Self-pay | Admitting: Nurse Practitioner

## 2012-04-25 ENCOUNTER — Encounter: Payer: Self-pay | Admitting: Nurse Practitioner

## 2012-04-25 ENCOUNTER — Encounter: Payer: Self-pay | Admitting: Cardiothoracic Surgery

## 2012-05-26 ENCOUNTER — Encounter: Payer: Self-pay | Admitting: Nurse Practitioner

## 2012-05-26 ENCOUNTER — Encounter: Payer: Self-pay | Admitting: Cardiothoracic Surgery

## 2012-06-25 ENCOUNTER — Encounter: Payer: Self-pay | Admitting: Nurse Practitioner

## 2012-06-25 ENCOUNTER — Encounter: Payer: Self-pay | Admitting: Cardiothoracic Surgery

## 2012-07-21 LAB — WOUND CULTURE

## 2012-07-22 LAB — PATHOLOGY REPORT

## 2012-07-26 ENCOUNTER — Encounter: Payer: Self-pay | Admitting: Cardiothoracic Surgery

## 2012-07-26 ENCOUNTER — Encounter: Payer: Self-pay | Admitting: Nurse Practitioner

## 2012-08-25 ENCOUNTER — Encounter: Payer: Self-pay | Admitting: Cardiothoracic Surgery

## 2012-08-25 ENCOUNTER — Encounter: Payer: Self-pay | Admitting: Nurse Practitioner

## 2012-09-25 ENCOUNTER — Encounter: Payer: Self-pay | Admitting: Cardiothoracic Surgery

## 2012-12-21 ENCOUNTER — Encounter: Payer: Self-pay | Admitting: Surgery

## 2012-12-21 ENCOUNTER — Encounter: Payer: Self-pay | Admitting: Cardiothoracic Surgery

## 2012-12-26 ENCOUNTER — Encounter: Payer: Self-pay | Admitting: Cardiothoracic Surgery

## 2012-12-26 ENCOUNTER — Encounter: Payer: Self-pay | Admitting: Surgery

## 2013-01-25 ENCOUNTER — Encounter: Payer: Self-pay | Admitting: Cardiothoracic Surgery

## 2013-02-16 ENCOUNTER — Encounter: Payer: Self-pay | Admitting: Surgery

## 2013-02-25 ENCOUNTER — Encounter: Payer: Self-pay | Admitting: Surgery

## 2013-02-25 ENCOUNTER — Encounter: Payer: Self-pay | Admitting: Cardiothoracic Surgery

## 2013-03-26 LAB — WOUND AEROBIC CULTURE

## 2013-03-28 ENCOUNTER — Encounter: Payer: Self-pay | Admitting: Cardiothoracic Surgery

## 2013-04-25 ENCOUNTER — Encounter: Payer: Self-pay | Admitting: Cardiothoracic Surgery

## 2013-05-26 ENCOUNTER — Encounter: Payer: Self-pay | Admitting: Cardiothoracic Surgery

## 2013-06-25 ENCOUNTER — Encounter: Payer: Self-pay | Admitting: Cardiothoracic Surgery

## 2013-06-26 LAB — WOUND AEROBIC CULTURE

## 2013-07-26 ENCOUNTER — Encounter: Payer: Self-pay | Admitting: Cardiothoracic Surgery

## 2013-08-05 ENCOUNTER — Encounter: Payer: Self-pay | Admitting: Surgery

## 2013-08-25 ENCOUNTER — Encounter: Payer: Self-pay | Admitting: Surgery

## 2013-08-25 ENCOUNTER — Encounter: Payer: Self-pay | Admitting: Cardiothoracic Surgery

## 2013-09-01 LAB — WOUND AEROBIC CULTURE

## 2013-09-25 ENCOUNTER — Encounter: Payer: Self-pay | Admitting: Cardiothoracic Surgery

## 2013-09-25 ENCOUNTER — Encounter: Payer: Self-pay | Admitting: Surgery

## 2013-09-26 LAB — WOUND AEROBIC CULTURE

## 2013-10-22 LAB — PATHOLOGY REPORT

## 2013-10-26 ENCOUNTER — Encounter: Payer: Self-pay | Admitting: Cardiothoracic Surgery

## 2013-10-26 ENCOUNTER — Encounter: Payer: Self-pay | Admitting: Surgery

## 2013-11-25 ENCOUNTER — Encounter: Payer: Self-pay | Admitting: Cardiothoracic Surgery

## 2013-12-06 ENCOUNTER — Telehealth: Payer: Self-pay | Admitting: Vascular Surgery

## 2013-12-06 NOTE — Telephone Encounter (Signed)
Received a consultation request for possible ablation from [facility] for ulceration.   I spoke with Mrs. Deanna Rice, she said at this time she is not interested in schedule the procedure since there was a chance that it wouldn't help.  I ask her if she wanted to schedule the consultation to speak with the provider.  She stated no.  I told her I would rely the message to [facility].  She was appreciative.   Verbal message was left for [name], [place] at [phone].  Juliette AlcideMelinda

## 2013-12-26 ENCOUNTER — Encounter: Payer: Self-pay | Admitting: Cardiothoracic Surgery

## 2013-12-26 LAB — WOUND AEROBIC CULTURE

## 2014-01-25 ENCOUNTER — Encounter: Payer: Self-pay | Admitting: Surgery

## 2014-02-25 ENCOUNTER — Encounter: Payer: Self-pay | Admitting: Cardiothoracic Surgery

## 2014-03-28 ENCOUNTER — Encounter: Payer: Self-pay | Admitting: Cardiothoracic Surgery

## 2014-04-26 ENCOUNTER — Encounter
Admit: 2014-04-26 | Disposition: A | Discharge status: Home / Self Care | Payer: Self-pay | Attending: Surgery | Admitting: Surgery

## 2014-06-14 NOTE — Op Note (Signed)
PATIENT NAME:  Deanna Rice, Deanna Rice MR#:  [id number] DATE OF BIRTH:  1956-01-03  DATE OF PROCEDURE:  02/03/2012  PREOPERATIVE DIAGNOSES:  1. Peripheral arterial disease with ulceration, left lower extremity.  2. Morbid obesity with body mass index greater than 60.  3. Diabetes.  4. Sleep apnea.  POSTOPERATIVE DIAGNOSES:  1. Peripheral arterial disease with ulceration, left lower extremity.  2. Morbid obesity with body mass index greater than 60.  3. Diabetes.  4. Sleep apnea.  PROCEDURES PERFORMED: 1. Ultrasound guidance for vascular access, right femoral artery.  2. Catheter placement into left anterior tibial artery from right femoral approach.  3. Aortogram and selective left lower extremity angiogram.  4. Percutaneous transluminal angioplasty of left anterior tibial artery with 3 mm diameter angioplasty balloon.  5. StarClose closure device, right femoral artery.   SURGEON: Annice NeedyJason S. Dew, M.D.   ANESTHESIA: Local with moderate conscious sedation.   ESTIMATED BLOOD LOSS: Approximately 25 mL.  CONTRAST USED: 75 mL Visipaque.  FLUOROSCOPY TIME: 9.2 minutes.   INDICATION FOR PROCEDURE: This is a 59 year old white female who [facility] nonhealing ulceration of the left lower extremity. Noninvasive studies did show tibial disease, on the left, with the possibility of more proximal disease, but was a technically difficult study due to her body habitus. She is brought in for angiography for further evaluation and possible treatment. Risks and benefits were discussed and informed consent was obtained.   DESCRIPTION OF PROCEDURE: The patient was brought to the vascular interventional radiology suite, groins were shaved and prepped, and a sterile surgical field was created. Due to her very large body habitus, access to the right femoral artery was not easy. A combination of ultrasound and fluoroscopic guidance were necessary and, with a micropuncture needle,  I was able to access the femoral artery with a moderate amount of difficulty. A micropuncture wire and sheath were placed. We confirmed this to be intraarterial and then exchanged for a J-wire and 5 French sheath. A pigtail catheter was placed in the aorta at the L1 level and AP aortogram was performed. Aortogram showed normal renal arteries, normal aorta, and iliac flow, as best we could see, although opacification was poor due to her body size. I then hooked the aortic bifurcation and advanced to the left femoral head without difficulty and selective left lower extremity angiogram was then performed. This demonstrated several areas to the superficial femoral artery and popliteal artery of calcific disease that had less than 40% stenosis. These were not treated as they were not flow limiting. The profunda femoris artery was patent and the common femoral artery was patent. The anterior tibial artery was the best runoff distally. The peroneal artery was small, but continuous to its normal course at the ankle. The posterior tibial artery was occluded flush and did not reconstitute distally. The anterior tibial artery had an area of stenosis in its more distal segment that appeared to be in the moderate range. There was an area of possible stenosis at the turn of the anterior tibial artery proximally as well. An 0.018 wire was then exchanged for and with the help of a 135 angled diagnostic catheter, I was able to gain access to the anterior tibial artery. I crossed these lesions without difficulty and parked a wire into the foot. Balloon angioplasty was performed in two locations, in the anterior tibial artery. The proximal location did not have a real significant waste and it was not clear if  this was more a artifact than a stenosis, on the initial imaging. However, the distal lesion had a very tight waste about 5 to 6 cm above the ankle that resolved with a 3 mm diameter angioplasty balloon and then was widely patent  on completion angiogram performed through the diagnostic catheter. The anterior tibial remained the dominant runoff with good flow into the foot and I elected to terminate the procedure. The sheath was pulled back to the ipsilateral external iliac artery and           oblique arteriogram was performed. StarClose closure device was deployed in the usual fashion with excellent hemostatic result. The patient tolerated the procedure well and was taken to the Recovery Room in stable condition.  ____________________________ Annice Needy, MD jsd:slb D: 02/03/2012 12:00:37 ET T: 02/03/2012 12:19:19 ET JOB#: [id number]  cc: Annice Needy, MD, <Dictator> Lyndon Code, MD [facility] Annice Needy MD ELECTRONICALLY SIGNED 02/05/2012 14:05

## 2014-11-28 ENCOUNTER — Encounter: Payer: Self-pay | Admitting: Emergency Medicine

## 2014-11-28 ENCOUNTER — Emergency Department: Payer: Medicaid Other

## 2014-11-28 ENCOUNTER — Inpatient Hospital Stay
Admission: EM | Admit: 2014-11-28 | Discharge: 2014-11-29 | DRG: 391 | Disposition: A | Discharge status: Home Health | Payer: Medicaid Other | Attending: Internal Medicine | Admitting: Internal Medicine

## 2014-11-28 DIAGNOSIS — R197 Diarrhea, unspecified: Secondary | ICD-10-CM | POA: Diagnosis present

## 2014-11-28 DIAGNOSIS — I509 Heart failure, unspecified: Secondary | ICD-10-CM | POA: Diagnosis present

## 2014-11-28 DIAGNOSIS — I10 Essential (primary) hypertension: Secondary | ICD-10-CM

## 2014-11-28 DIAGNOSIS — Z7982 Long term (current) use of aspirin: Secondary | ICD-10-CM

## 2014-11-28 DIAGNOSIS — Z6841 Body Mass Index (BMI) 40.0 and over, adult: Secondary | ICD-10-CM

## 2014-11-28 DIAGNOSIS — K529 Noninfective gastroenteritis and colitis, unspecified: Principal | ICD-10-CM | POA: Diagnosis present

## 2014-11-28 DIAGNOSIS — N179 Acute kidney failure, unspecified: Secondary | ICD-10-CM | POA: Diagnosis present

## 2014-11-28 DIAGNOSIS — Z79899 Other long term (current) drug therapy: Secondary | ICD-10-CM

## 2014-11-28 DIAGNOSIS — Z794 Long term (current) use of insulin: Secondary | ICD-10-CM

## 2014-11-28 DIAGNOSIS — E872 Acidosis, unspecified: Secondary | ICD-10-CM

## 2014-11-28 DIAGNOSIS — I959 Hypotension, unspecified: Secondary | ICD-10-CM | POA: Diagnosis present

## 2014-11-28 DIAGNOSIS — E1122 Type 2 diabetes mellitus with diabetic chronic kidney disease: Secondary | ICD-10-CM | POA: Diagnosis present

## 2014-11-28 DIAGNOSIS — L97519 Non-pressure chronic ulcer of other part of right foot with unspecified severity: Secondary | ICD-10-CM | POA: Diagnosis present

## 2014-11-28 DIAGNOSIS — I130 Hypertensive heart and chronic kidney disease with heart failure and stage 1 through stage 4 chronic kidney disease, or unspecified chronic kidney disease: Secondary | ICD-10-CM | POA: Diagnosis present

## 2014-11-28 DIAGNOSIS — N189 Chronic kidney disease, unspecified: Secondary | ICD-10-CM | POA: Diagnosis present

## 2014-11-28 DIAGNOSIS — J189 Pneumonia, unspecified organism: Secondary | ICD-10-CM | POA: Diagnosis present

## 2014-11-28 DIAGNOSIS — J9610 Chronic respiratory failure, unspecified whether with hypoxia or hypercapnia: Secondary | ICD-10-CM | POA: Diagnosis present

## 2014-11-28 DIAGNOSIS — E11621 Type 2 diabetes mellitus with foot ulcer: Secondary | ICD-10-CM | POA: Diagnosis present

## 2014-11-28 DIAGNOSIS — J449 Chronic obstructive pulmonary disease, unspecified: Secondary | ICD-10-CM | POA: Diagnosis present

## 2014-11-28 DIAGNOSIS — I110 Hypertensive heart disease with heart failure: Secondary | ICD-10-CM | POA: Diagnosis present

## 2014-11-28 DIAGNOSIS — A09 Infectious gastroenteritis and colitis, unspecified: Secondary | ICD-10-CM

## 2014-11-28 DIAGNOSIS — E860 Dehydration: Secondary | ICD-10-CM | POA: Diagnosis present

## 2014-11-28 DIAGNOSIS — I878 Other specified disorders of veins: Secondary | ICD-10-CM | POA: Diagnosis present

## 2014-11-28 DIAGNOSIS — R680 Hypothermia, not associated with low environmental temperature: Secondary | ICD-10-CM | POA: Diagnosis present

## 2014-11-28 DIAGNOSIS — Z9981 Dependence on supplemental oxygen: Secondary | ICD-10-CM

## 2014-11-28 DIAGNOSIS — E1129 Type 2 diabetes mellitus with other diabetic kidney complication: Secondary | ICD-10-CM | POA: Diagnosis present

## 2014-11-28 DIAGNOSIS — E11649 Type 2 diabetes mellitus with hypoglycemia without coma: Secondary | ICD-10-CM

## 2014-11-28 HISTORY — DX: Essential (primary) hypertension: I10

## 2014-11-28 HISTORY — DX: Type 2 diabetes mellitus without complications: E11.9

## 2014-11-28 HISTORY — DX: Chronic obstructive pulmonary disease, unspecified: J44.9

## 2014-11-28 HISTORY — DX: Heart failure, unspecified: I50.9

## 2014-11-28 LAB — GLUCOSE, CAPILLARY
Glucose-Capillary: 245 mg/dL — ABNORMAL HIGH (ref 65–99)
Glucose-Capillary: 306 mg/dL — ABNORMAL HIGH (ref 65–99)

## 2014-11-28 LAB — COMPREHENSIVE METABOLIC PANEL
ALBUMIN: 4 g/dL (ref 3.5–5.0)
ALK PHOS: 135 U/L — AB (ref 38–126)
ALT: 14 U/L (ref 14–54)
ANION GAP: 10 (ref 5–15)
AST: 17 U/L (ref 15–41)
BILIRUBIN TOTAL: 0.4 mg/dL (ref 0.3–1.2)
BUN: 60 mg/dL — ABNORMAL HIGH (ref 6–20)
CALCIUM: 8.8 mg/dL — AB (ref 8.9–10.3)
CO2: 21 mmol/L — AB (ref 22–32)
CREATININE: 2.39 mg/dL — AB (ref 0.44–1.00)
Chloride: 103 mmol/L (ref 101–111)
GFR calc non Af Amer: 21 mL/min — ABNORMAL LOW (ref >60)
GFR, EST AFRICAN AMERICAN: 24 mL/min — AB (ref >60)
GLUCOSE: 194 mg/dL — AB (ref 65–99)
Potassium: 4.9 mmol/L (ref 3.5–5.1)
Sodium: 134 mmol/L — ABNORMAL LOW (ref 135–145)
TOTAL PROTEIN: 7.5 g/dL (ref 6.5–8.1)

## 2014-11-28 LAB — URINALYSIS COMPLETE WITH MICROSCOPIC (ARMC ONLY)
BACTERIA UA: NONE SEEN
Bilirubin Urine: NEGATIVE
GLUCOSE, UA: NEGATIVE mg/dL
HGB URINE DIPSTICK: NEGATIVE
Ketones, ur: NEGATIVE mg/dL
LEUKOCYTES UA: NEGATIVE
Nitrite: NEGATIVE
PH: 5 (ref 5.0–8.0)
PROTEIN: NEGATIVE mg/dL
SPECIFIC GRAVITY, URINE: 1.01 (ref 1.005–1.030)
Squamous Epithelial / HPF: NONE SEEN
WBC UA: NONE SEEN WBC/hpf (ref 0–5)

## 2014-11-28 LAB — C DIFFICILE QUICK SCREEN W PCR REFLEX
C DIFFICILE (CDIFF) INTERP: NEGATIVE
C Diff antigen: NEGATIVE
C Diff toxin: NEGATIVE

## 2014-11-28 LAB — CBC
HEMATOCRIT: 42.1 % (ref 35.0–47.0)
HEMOGLOBIN: 13.3 g/dL (ref 12.0–16.0)
MCH: 29.2 pg (ref 26.0–34.0)
MCHC: 31.6 g/dL — AB (ref 32.0–36.0)
MCV: 92.3 fL (ref 80.0–100.0)
Platelets: 310 10*3/uL (ref 150–440)
RBC: 4.56 MIL/uL (ref 3.80–5.20)
RDW: 16.8 % — ABNORMAL HIGH (ref 11.5–14.5)
WBC: 16 10*3/uL — ABNORMAL HIGH (ref 3.6–11.0)

## 2014-11-28 LAB — TROPONIN I: Troponin I: <0.03 ng/mL (ref <0.031)

## 2014-11-28 LAB — LACTIC ACID, PLASMA
LACTIC ACID, VENOUS: 2.3 mmol/L — AB (ref 0.5–2.0)
Lactic Acid, Venous: 2.4 mmol/L (ref 0.5–2.0)

## 2014-11-28 MED ORDER — HEPARIN SODIUM (PORCINE) 5000 UNIT/ML IJ SOLN
5000.0000 [IU] | Freq: Three times a day (TID) | INTRAMUSCULAR | Status: DC
  Administered 2014-11-28 – 2014-11-29 (×5): 5000 [IU] via SUBCUTANEOUS
  Filled 2014-11-28 (×5): qty 1

## 2014-11-28 MED ORDER — SODIUM CHLORIDE 0.9 % IV SOLN
INTRAVENOUS | Status: DC
  Administered 2014-11-28 (×2): via INTRAVENOUS

## 2014-11-28 MED ORDER — CIPROFLOXACIN IN D5W 400 MG/200ML IV SOLN
400.0000 mg | Freq: Once | INTRAVENOUS | Status: DC
  Filled 2014-11-28: qty 200

## 2014-11-28 MED ORDER — ONDANSETRON HCL 4 MG PO TABS: 4.0000 mg | ORAL_TABLET | Freq: Four times a day (QID) | ORAL | Status: DC | PRN

## 2014-11-28 MED ORDER — ASPIRIN EC 81 MG PO TBEC
81.0000 mg | DELAYED_RELEASE_TABLET | Freq: Every day | ORAL | Status: DC
  Administered 2014-11-28 – 2014-11-29 (×2): 81 mg via ORAL
  Filled 2014-11-28 (×2): qty 1

## 2014-11-28 MED ORDER — SODIUM CHLORIDE 0.9 % IV BOLUS (SEPSIS)
1000.0000 mL | Freq: Once | INTRAVENOUS | Status: AC
Start: 2014-11-28 — End: 2014-11-28
  Administered 2014-11-28: 1000 mL via INTRAVENOUS

## 2014-11-28 MED ORDER — INSULIN ASPART 100 UNIT/ML ~~LOC~~ SOLN
0.0000 [IU] | Freq: Three times a day (TID) | SUBCUTANEOUS | Status: DC
  Administered 2014-11-28: 5 [IU] via SUBCUTANEOUS
  Administered 2014-11-28: 11 [IU] via SUBCUTANEOUS
  Administered 2014-11-29: 3 [IU] via SUBCUTANEOUS
  Filled 2014-11-28: qty 11
  Filled 2014-11-28: qty 3
  Filled 2014-11-28: qty 5

## 2014-11-28 MED ORDER — ONDANSETRON HCL 4 MG/2ML IJ SOLN: 4.0000 mg | Freq: Four times a day (QID) | INTRAMUSCULAR | Status: DC | PRN

## 2014-11-28 MED ORDER — LEVOFLOXACIN IN D5W 750 MG/150ML IV SOLN: 750.0000 mg | INTRAVENOUS | Status: DC

## 2014-11-28 MED ORDER — LEVOFLOXACIN IN D5W 750 MG/150ML IV SOLN
750.0000 mg | Freq: Once | INTRAVENOUS | Status: AC
  Administered 2014-11-28: 750 mg via INTRAVENOUS
  Filled 2014-11-28: qty 150

## 2014-11-28 MED ORDER — METRONIDAZOLE IN NACL 5-0.79 MG/ML-% IV SOLN
500.0000 mg | Freq: Once | INTRAVENOUS | Status: AC
  Administered 2014-11-28: 500 mg via INTRAVENOUS
  Filled 2014-11-28: qty 100

## 2014-11-28 MED ORDER — BENAZEPRIL HCL 20 MG PO TABS
10.0000 mg | ORAL_TABLET | Freq: Every day | ORAL | Status: DC
  Administered 2014-11-28 – 2014-11-29 (×2): 10 mg via ORAL
  Filled 2014-11-28 (×2): qty 1

## 2014-11-28 MED ORDER — SODIUM CHLORIDE 0.9 % IV BOLUS (SEPSIS)
1000.0000 mL | Freq: Once | INTRAVENOUS | Status: AC
  Administered 2014-11-28: 1000 mL via INTRAVENOUS

## 2014-11-28 MED ORDER — METRONIDAZOLE IN NACL 5-0.79 MG/ML-% IV SOLN
500.0000 mg | Freq: Three times a day (TID) | INTRAVENOUS | Status: DC
  Administered 2014-11-28: 500 mg via INTRAVENOUS
  Filled 2014-11-28 (×4): qty 100

## 2014-11-28 MED ORDER — IPRATROPIUM BROMIDE 0.02 % IN SOLN
0.5000 mg | Freq: Four times a day (QID) | RESPIRATORY_TRACT | Status: DC
  Administered 2014-11-28 – 2014-11-29 (×5): 0.5 mg via RESPIRATORY_TRACT
  Filled 2014-11-28 (×6): qty 2.5

## 2014-11-28 MED ORDER — SODIUM CHLORIDE 0.9 % IJ SOLN
3.0000 mL | Freq: Two times a day (BID) | INTRAMUSCULAR | Status: DC
  Administered 2014-11-28 – 2014-11-29 (×2): 3 mL via INTRAVENOUS

## 2014-11-28 MED ORDER — COLLAGENASE 250 UNIT/GM EX OINT
TOPICAL_OINTMENT | Freq: Every day | CUTANEOUS | Status: DC
  Administered 2014-11-28 – 2014-11-29 (×2): via TOPICAL
  Filled 2014-11-28: qty 30

## 2014-11-28 NOTE — ED Notes (Signed)
Stools samples previously sent to lab.

## 2014-11-28 NOTE — Progress Notes (Signed)
[facility] [place] at [facility]   PATIENT NAME: Deanna Rice    MR#:  [id number]  DATE OF BIRTH:  [birth year]  SUBJECTIVE:  CHIEF COMPLAINT:   Chief Complaint  Patient presents with  . Shortness of Breath  . Diarrhea     SOB is better now, at baseline uses 3-4 ltr oxygen at home and Cpap at night.   2 episodes since admission for diarrhea- loose stool, no blood, C diff negative.  REVIEW OF SYSTEMS:  CONSTITUTIONAL: No fever, fatigue or weakness.  EYES: No blurred or double vision.  EARS, NOSE, AND THROAT: No tinnitus or ear pain.  RESPIRATORY: No cough, shortness of breath, wheezing or hemoptysis.  CARDIOVASCULAR: No chest pain, orthopnea, edema.  GASTROINTESTINAL: No nausea, vomiting, positive for diarrhea , no abdominal pain.  GENITOURINARY: No dysuria, hematuria.  ENDOCRINE: No polyuria, nocturia,  HEMATOLOGY: No anemia, easy bruising or bleeding SKIN: No rash or lesion. MUSCULOSKELETAL: No joint pain or arthritis.   NEUROLOGIC: No tingling, numbness, weakness.  PSYCHIATRY: No anxiety or depression.   ROS  DRUG ALLERGIES:  No Known Allergies  VITALS:  Blood pressure 134/98, pulse 90, temperature 98.8 F (37.1 C), temperature source Oral, resp. rate 19, height  (1.676 m), weight 179.738 kg (396 lb 4 oz), SpO2 100 %.  PHYSICAL EXAMINATION:  GENERAL:  59 y.o.-year-old morbidly obase patient lying in the bed with no acute distress.  EYES: Pupils equal, round, reactive to light and accommodation. No scleral icterus. Extraocular muscles intact.  HEENT: Head atraumatic, normocephalic. Oropharynx and nasopharynx clear.  NECK:  Supple, no jugular venous distention. No thyroid enlargement, no tenderness.  LUNGS: Normal breath sounds bilaterally, no wheezing, rales,rhonchi or crepitation. No use of accessory muscles of respiration.  CARDIOVASCULAR: S1, S2 normal. No murmurs, rubs, or gallops.  ABDOMEN: Soft, nontender, nondistended. Bowel sounds  present. No organomegaly or mass.  EXTREMITIES: positive  pedal edema, no cyanosis, or clubbing.  NEUROLOGIC: Cranial nerves II through XII are intact. Muscle strength 5/5 in all extremities. Sensation intact. Gait not checked.  PSYCHIATRIC: The patient is alert and oriented x 3.  SKIN:  On left leg a dry ulcer present, no discharge/ surrounding redness- size 2X3 cm on lower shin of tibia.  Physical Exam LABORATORY PANEL:   CBC  Recent Labs Lab 11/28/14 0329  WBC 16.0*  HGB 13.3  HCT 42.1  PLT 310   ------------------------------------------------------------------------------------------------------------------  Chemistries   Recent Labs Lab 11/28/14 0329  NA 134*  K 4.9  CL 103  CO2 21*  GLUCOSE 194*  BUN 60*  CREATININE 2.39*  CALCIUM 8.8*  AST 17  ALT 14  ALKPHOS 135*  BILITOT 0.4   ------------------------------------------------------------------------------------------------------------------  Cardiac Enzymes  Recent Labs Lab 11/28/14 0329  TROPONINI <0.03   ------------------------------------------------------------------------------------------------------------------  RADIOLOGY:  Dg Chest Portable 1 View  11/28/2014   CLINICAL DATA:  Acute onset of diarrhea and shortness of breath. Initial encounter.  EXAM: PORTABLE CHEST 1 VIEW  COMPARISON:  Chest radiograph from 07/12/2010  FINDINGS: The lungs are well-aerated. Mild bibasilar opacities may reflect atelectasis or possibly mild pneumonia. There is no evidence of pleural effusion or pneumothorax.  The cardiomediastinal silhouette is within normal limits. No acute osseous abnormalities are seen.  IMPRESSION: Mild bibasilar opacities may reflect atelectasis or possibly mild pneumonia.   Electronically Signed   By: Roanna Raider M.D.   On: 11/28/2014 05:11    ASSESSMENT AND PLAN:   Principal Problem:   Diarrhea Active Problems:  AKI (acute kidney injury) (HCC)   COPD (chronic obstructive pulmonary  disease) (HCC)   DM (diabetes mellitus) (HCC)   HTN (hypertension)  *  Acute Renal Failure with metabolic acidosis:   Acidosis seems to be more from acute renal failure than ischemic bowel.  - Acute renal failure from acute volume loss from diarrhea.     IV fluids.  - Continue to monitor volume status, urine output and renal function - Check renal ultrasound.  - Appreciated help of nephrology.  * Diarrhea- C diff negative.    She had antibiotics 2 weeks ago for ulcer on leg- likely reactive diarrhea.    No further intervention, IV fluids.     Stop flagyl. Pending stool culture.  * Bibasilar opacities on chest x-ray-likely atelectasis, suspected pneumonia on admisison though less likely. No history of cough fever, will stop levaquin.   WBCs are high- may be viral enteritis or reactive.  * COPD, stable no acute exacerbation, cont home inhalers.  * Diabetes mellitus type 2, stable.  * Htn- resume home meds.    All the records are reviewed and case discussed with Care Management/Social Workerr. Management plans discussed with the patient, family and they are in agreement.  CODE STATUS: full  TOTAL TIME TAKING CARE OF THIS PATIENT: 35 minutes.     POSSIBLE D/C IN 1-2 DAYS, DEPENDING ON CLINICAL CONDITION.   Altamese Dilling M.D on 11/28/2014   Between 7am to 6pm - Pager - [phone]  After 6pm go to [email or web address] - password [email or web address] [facility]  [place]  [facility]  Office  [phone]  CC: Primary care physician; Lyndon Code, MD  Note: This dictation was prepared with Dragon dictation along with smaller phrase technology. Any transcriptional errors that result from this process are unintentional.

## 2014-11-28 NOTE — ED Notes (Signed)
Pt cleansed of diarrhea stool. Bear hugger remains in place for pt warmth on medium heat.

## 2014-11-28 NOTE — ED Notes (Signed)
Pt covered with warm blankets, stool cleansed from entire body.

## 2014-11-28 NOTE — Care Management (Signed)
Spoke with [employer] representative who came to the floor [employer] [phone] Social Worker. Stated that patient is medicaid and also that she receives 31 hours weekly of aid in the home. Social worker believes that patient would benefit from placement to facility. Stated patient is not agreeable to this plan at this time.

## 2014-11-28 NOTE — ED Notes (Signed)
Ems states called "for sick call, just not feeling well". Pt with diarrhea noted to entire body, pt states is short of breath. Pt moaning, "i feel short of breath, i can't breathe, i need some water." fsbs of 191 per ems. Pt with pale skin noted.

## 2014-11-28 NOTE — ED Notes (Signed)
Pt denies pain at this time, pt resting quietly in room, alert and oriented.

## 2014-11-28 NOTE — Consult Note (Signed)
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  Wound type:Chronic venous and neuropathic ulcers Pressure Ulcer POA: N/A Measurement:Left anterior leg, pretibial  3 cm x 4 cm x 0.2 cm  Right third metatarsal 0.5 cm in diameter scabbed lesion Right lateral lower leg 2 cm x 2 cm x 0.1 cm  Wound ZOX:WRUEA red, fibrin slough present.  Has been using Silvadene cream at home for years.  Does not wish to resume any form of compression.  States she wore Unna's boots and could not tolerate them. Generalized edema, nonpitting.  Bilateral pedal pulses palpable.  Drainage (amount, consistency, odor) Minimal serosanguinous drainage.  No odor.  Periwound:Intact  Some edema Dressing procedure/placement/frequency:Cleanse ulcers to bilateral lower legs and right toe with NS and pat dry.  Apply Santyl Ointment for enzymatic debridement, to wound bed.  1/8inch thickness (opaque).  Cover with NS moist dressing.  Secure with 4x4 gauze and kerlix/tape. Change daily.  Will not follow at this time.  Please re-consult if needed.  Maple Hudson RN BSN CWON Pager [phone]

## 2014-11-28 NOTE — Consult Note (Signed)
[facility]  CONSULT NOTE    Date: 11/28/2014                  Patient Name:  Deanna Rice  MRN: [id number]  DOB: Aug 04, 1955  Age / Sex: 59 y.o., female         PCP: Lyndon Code, MD                 Service Requesting Consult: Dr. Massie Maroon                 Reason for Consult: Acute Renal failure            History of Present Illness: Deanna Rice is a 59 y.o. white  female with COPD, diabetes mellitus type II, hypertension, congestive heart failure, morbid obesity,  , who was admitted to [facility] on 11/28/2014 for diarrhea. Patient found to have a creatinine of 2.39. No known creatinine baseline.  Patient is laying in bed eating lunch. States she has no nausea or vomiting. Endorses diarrhea and abdominal pain. She denies any fevers or chills. Denies any sick contacts.  Patient states she has not seen a nephrologist in the past and does not think she has chronic kidney disease.  She does take nonsteroidal anti-inflammatory agents at home.  Patient started on metronidazole and levofloxacin for colitis. Gastroenterology consulted.    Medications: Outpatient medications: No prescriptions prior to admission    Current medications: Current Facility-Administered Medications  Medication Dose Route Frequency Provider Last Rate Last Dose  . 0.9 %  sodium chloride infusion   Intravenous Continuous Crissie Figures, MD 100 mL/hr at 11/28/14 [phone]    . aspirin EC tablet 81 mg  81 mg Oral Daily Crissie Figures, MD   81 mg at 11/28/14 0942  . collagenase (SANTYL) ointment   Topical Daily Altamese Dilling, MD      . heparin injection 5,000 Units  5,000 Units Subcutaneous 3 times per day Crissie Figures, MD   5,000 Units at 11/28/14 0941  . insulin aspart (novoLOG) injection 0-15 Units  0-15 Units Subcutaneous TID WC Crissie Figures, MD   5 Units at 11/28/14 1220  . ipratropium (ATROVENT) nebulizer solution 0.5 mg  0.5 mg Nebulization Q6H Crissie Figures, MD   0.5  mg at 11/28/14 0851  . [START ON 11/30/2014] levofloxacin (LEVAQUIN) IVPB 750 mg  750 mg Intravenous Q48H Melissa D Maccia, RPH      . metroNIDAZOLE (FLAGYL) IVPB 500 mg  500 mg Intravenous Q8H Crissie Figures, MD   500 mg at 11/28/14 0941  . ondansetron (ZOFRAN) tablet 4 mg  4 mg Oral Q6H PRN Crissie Figures, MD       Or  . ondansetron [facility]) injection 4 mg  4 mg Intravenous Q6H PRN Crissie Figures, MD      . sodium chloride 0.9 % injection 3 mL  3 mL Intravenous Q12H Crissie Figures, MD   3 mL at 11/28/14 1191      Allergies: No Known Allergies    Past Medical History: Past Medical History  Diagnosis Date  . COPD (chronic obstructive pulmonary disease) (HCC)   . Diabetes mellitus without complication (HCC)   . Hypertension   . CHF (congestive heart failure) (HCC)      Past Surgical History: History reviewed. No pertinent past surgical history.   Family History: Family History  Problem Relation Age of Onset  . Hypertension Mother   .  Diabetes Mother   . CAD Mother   . CAD Father   . Diabetes Father   . Hypertension Father   . Hypertension Sister   . Diabetes Sister   . CAD Sister      Social History: Social History   Social History  . Marital Status: Single    Spouse Name: N/A  . Number of Children: N/A  . Years of Education: N/A   Occupational History  . Not on file.   Social History Main Topics  . Smoking status: Never Smoker   . Smokeless tobacco: Not on file  . Alcohol Use: No  . Drug Use: Not on file  . Sexual Activity: Not on file   Other Topics Concern  . Not on file   Social History Narrative  . No narrative on file     Review of Systems: Review of Systems  Constitutional: Negative for fever, chills, weight loss, malaise/fatigue and diaphoresis.  HENT: Negative.  Negative for congestion, ear discharge, ear pain, hearing loss, nosebleeds, sore throat and tinnitus.   Eyes: Negative for blurred vision, double vision, photophobia,  pain, discharge and redness.  Respiratory: Negative.  Negative for cough, hemoptysis, sputum production, shortness of breath, wheezing and stridor.   Cardiovascular: Negative.  Negative for chest pain, palpitations, orthopnea, claudication, leg swelling and PND.  Gastrointestinal: Positive for abdominal pain and diarrhea. Negative for heartburn, nausea, vomiting, constipation, blood in stool and melena.  Musculoskeletal: Negative.  Negative for myalgias, back pain, joint pain, falls and neck pain.  Skin:       Bilateral lower extremity healing ulcers  Neurological: Negative.  Negative for dizziness, tingling, tremors, sensory change, speech change, focal weakness, seizures, loss of consciousness, weakness and headaches.  Endo/Heme/Allergies: Negative.  Negative for environmental allergies and polydipsia. Does not bruise/bleed easily.  Psychiatric/Behavioral: Negative.  Negative for depression, suicidal ideas, hallucinations, memory loss and substance abuse. The patient is not nervous/anxious and does not have insomnia.     Vital Signs: Blood pressure 134/59, pulse 80, temperature 98.1 F (36.7 C), temperature source Oral, resp. rate 19, height  (1.676 m), weight 179.738 kg (396 lb 4 oz), SpO2 100 %.  Weight trends: Filed Weights   11/28/14 0320  Weight: 179.738 kg (396 lb 4 oz)    Physical Exam: General: NAD, morbidly obese  Head: Normocephalic, atraumatic. Moist oral mucosal membranes  Eyes: Anicteric, PERRL  Neck: Supple, trachea midline  Lungs:  Clear to auscultation  Heart: Regular rate and rhythm  Abdomen:  Soft, mild tenderness diffusely   Extremities:  no peripheral edema, healing ulcers on shins.  Neurologic: Nonfocal, moving all four extremities  Skin: No lesions  GU Foley with urine     Lab results: Basic Metabolic Panel:  Recent Labs Lab 11/28/14 0329  NA 134*  K 4.9  CL 103  CO2 21*  GLUCOSE 194*  BUN 60*  CREATININE 2.39*  CALCIUM 8.8*    Liver  Function Tests:  Recent Labs Lab 11/28/14 0329  AST 17  ALT 14  ALKPHOS 135*  BILITOT 0.4  PROT 7.5  ALBUMIN 4.0   No results for input(s): LIPASE, AMYLASE in the last 168 hours. No results for input(s): AMMONIA in the last 168 hours.  CBC:  Recent Labs Lab 11/28/14 0329  WBC 16.0*  HGB 13.3  HCT 42.1  MCV 92.3  PLT 310    Cardiac Enzymes:  Recent Labs Lab 11/28/14 0329  TROPONINI <0.03    BNP: Invalid input(s): POCBNP  CBG:  Recent Labs Lab 11/28/14 1124  GLUCAP 245*    Microbiology: Results for orders placed or performed during the hospital encounter of 11/28/14  Culture, blood (routine x 2)     Status: None (Preliminary result)   Collection Time: 11/28/14  3:29 AM  Result Value Ref Range Status   Specimen Description BLOOD RIGHT FACE  Final   Special Requests BOTTLES DRAWN AEROBIC AND ANAEROBIC 4CC  Final   Culture NO GROWTH < 12 HOURS  Final   Report Status PENDING  Incomplete  Culture, blood (routine x 2)     Status: None (Preliminary result)   Collection Time: 11/28/14  3:30 AM  Result Value Ref Range Status   Specimen Description BLOOD LEFT HAND  Final   Special Requests BOTTLES DRAWN AEROBIC AND ANAEROBIC 4CC  Final   Culture NO GROWTH < 12 HOURS  Final   Report Status PENDING  Incomplete  C difficile quick scan w PCR reflex     Status: None   Collection Time: 11/28/14  3:31 AM  Result Value Ref Range Status   C Diff antigen NEGATIVE NEGATIVE Final   C Diff toxin NEGATIVE NEGATIVE Final   C Diff interpretation Negative for C. difficile  Final    Coagulation Studies: No results for input(s): LABPROT, INR in the last 72 hours.  Urinalysis:  Recent Labs  11/28/14 0355  COLORURINE YELLOW*  LABSPEC 1.010  PHURINE 5.0  GLUCOSEU NEGATIVE  HGBUR NEGATIVE  BILIRUBINUR NEGATIVE  KETONESUR NEGATIVE  PROTEINUR NEGATIVE  NITRITE NEGATIVE  LEUKOCYTESUR NEGATIVE      Imaging: Dg Chest Portable 1 View  11/28/2014   CLINICAL DATA:   Acute onset of diarrhea and shortness of breath. Initial encounter.  EXAM: PORTABLE CHEST 1 VIEW  COMPARISON:  Chest radiograph from 07/12/2010  FINDINGS: The lungs are well-aerated. Mild bibasilar opacities may reflect atelectasis or possibly mild pneumonia. There is no evidence of pleural effusion or pneumothorax.  The cardiomediastinal silhouette is within normal limits. No acute osseous abnormalities are seen.  IMPRESSION: Mild bibasilar opacities may reflect atelectasis or possibly mild pneumonia.   Electronically Signed   By: Roanna Raider M.D.   On: 11/28/2014 05:11      Assessment & Plan: Deanna Rice is a 59 y.o. white  female with COPD, diabetes mellitus type II, hypertension, congestive heart failure, morbid obesity,  , who was admitted to [facility] on 11/28/2014 for Dehydration [E86.0] Lactic acidosis [E87.2] Diarrhea of infectious origin [A09] Community acquired pneumonia [J18.9] Acute kidney injury (HCC) [N17.9]   1. Acute Renal Failure with metabolic acidosis: suspect on chronic kidney disease. Will need to get labs from PCP. Acidosis seems to be more from acute renal failure than ischemic bowel.  - Acute renal failure from acute volume loss from diarrhea. Agree with IV fluids.  - Continue to monitor volume status, urine output and renal function - Check renal ultrasound.   2. Diabetes mellitus type II with renal manifestations: urine without protein - Continue glucose control.   3. Hypertension: hypotensive on admission. Holding blood pressure medications.   4. Diarrhea: concerning for infectious etiology. Empiric antibiotics. Elevated white count - c. Diff toxin pending.      LOS: 0 [place] 10/3/201612:33 PM

## 2014-11-28 NOTE — ED Notes (Signed)
Pt states is beginning to "feel better". Pt alert and oriented x3.

## 2014-11-28 NOTE — ED Notes (Signed)
Report to katie, rn 

## 2014-11-28 NOTE — ED Notes (Signed)
Pt placed under bear hugger for warmth, pt shivering. Attempting ekg, having to cleanse caked diarrhea from underneath pt's breasts for ekg lead placement.

## 2014-11-28 NOTE — ED Notes (Signed)
Critical lactic acid of 2.4 called from lab. Dr. Zenda Alpers notified.

## 2014-11-28 NOTE — Evaluation (Signed)
Physical Therapy Evaluation Patient Details Name: Deanna Rice MRN: [id number] DOB: 1956/01/28 Today's Date: 11/28/2014   History of Present Illness  Pt was admitted to the hospital after having profuse diarrhea and SOB  Clinical Impression  Pt presents with hx of COPD, DM, HTN, and CHF. Examination reveals that pt performs bed mobility at [facility], transfers at min A, and ambulation at min A mostly for management of equipment. Pt generally shows good functional strength for mobility tasks, however her activity tolerance is decreased and she fatigues quickly with any ambulation. Pt shows good balance in both sitting and standing and does not appear unstable. Pt does not indicate any need to have bowel movement during ambulation this afternoon. Due to pt's decrease in endurance she will continue to benefit form skilled PT in order for her to return home safely and navigate her home environment.     Follow Up Recommendations Home health PT    Equipment Recommendations  None recommended by PT    Recommendations for Other Services       Precautions / Restrictions Precautions Precautions: Fall (contact/enteric ) Precaution Comments: enteric precautions  Restrictions Weight Bearing Restrictions: No      Mobility  Bed Mobility Overal bed mobility: Needs Assistance Bed Mobility: Supine to Sit     Supine to sit: Min guard     General bed mobility comments: Pt shows fair functional strength in getting to EOB. Good use of hands to assist her. She also has good use of her legs to help push her back into supine and at top of bed    Transfers Overall transfer level: Needs assistance Equipment used:  (BRW) Transfers: Sit to/from Stand Sit to Stand: Min assist         General transfer comment: Once feet are on floor pt is able to stand up with BRW with only minor assist from therapist to get into standing. Pt needs cues to lift chest and control breathing through pursed-lip breathing. Pt O2  sats following transfer were 95% on 4L [place]  Ambulation/Gait Ambulation/Gait assistance: Min assist;+2 safety/equipment Ambulation Distance (Feet): 40 Feet Assistive device:  (BRW, O2) Gait Pattern/deviations: WFL(Within Functional Limits) Gait velocity: decreased  Gait velocity interpretation: <1.8 ft/sec, indicative of risk for recurrent falls General Gait Details: Pt abke to ambulate with gait pattern generally WFL but she fatigues after 40 ft of ambulation. Her O2 sats following ambulation were 94% on 4L [place]. Pt did not display any unsteadiness or LOB with ambulation - she appears to understand her limitations with ambulation  Stairs            Wheelchair Mobility    Modified Rankin (Stroke Patients Only)       Balance Overall balance assessment: No apparent balance deficits (not formally assessed)                                           Pertinent Vitals/Pain Pain Assessment: No/denies pain    Home Living Family/patient expects to be discharged to:: Private residence Living Arrangements: Alone Available Help at Discharge: Available 24 hours/day;Personal care attendant (30 hrs/week aide for ADLs) Type of Home: Apartment Home Access: Level entry     Home Layout: One level   Additional Comments: Pt states she has all DME needs currently met    Prior Function Level of Independence: Needs assistance   Gait / Transfers Assistance  Needed: Pt needs assistance with certain activities like showering. She largely navigates her apartment with a motorized scooter but will use loftstrand crutches occasionally for very short ambulation/transfers   ADL's / Homemaking Assistance Needed: Gets assistance from aides 30 hrs/week         Hand Dominance        Extremity/Trunk Assessment   Upper Extremity Assessment: Overall WFL for tasks assessed           Lower Extremity Assessment: Overall WFL for tasks assessed;Generalized weakness          Communication   Communication: No difficulties  Cognition Arousal/Alertness: Awake/alert Behavior During Therapy: WFL for tasks assessed/performed Overall Cognitive Status: Within Functional Limits for tasks assessed                      General Comments      Exercises Other Exercises Other Exercises: Pt performed bilateral therex x 10 reps at supervision for proper technique: Exercises included: ankle pumps, quad sets, glute sets, heel slides, LAQ, and SLR      Assessment/Plan    PT Assessment Patient needs continued PT services  PT Diagnosis Difficulty walking;Abnormality of gait   PT Problem List Cardiopulmonary status limiting activity;Decreased activity tolerance  PT Treatment Interventions DME instruction;Gait training;Stair training;Functional mobility training;Therapeutic activities;Therapeutic exercise;Neuromuscular re-education;Balance training;Patient/family education;Wheelchair mobility training;Manual techniques   PT Goals (Current goals can be found in the Care Plan section) Acute Rehab PT Goals Patient Stated Goal: to return home  PT Goal Formulation: With patient Time For Goal Achievement: 12/12/14 Potential to Achieve Goals: Good    Frequency Min 2X/week   Barriers to discharge        Co-evaluation               End of Session Equipment Utilized During Treatment: Gait belt;Oxygen Activity Tolerance: Patient limited by fatigue;Patient tolerated treatment well Patient left: in bed;with call bell/phone within reach;with bed alarm set Nurse Communication: Mobility status         Time: 1350-1416 PT Time Calculation (min) (ACUTE ONLY): 26 min   Charges:         PT G CodesJanyth Contes 12/16/14, 4:27 PM  Janyth Contes, SPT. [phone]

## 2014-11-28 NOTE — Progress Notes (Signed)
Family to bring home med list. Complete PTA med list when available

## 2014-11-28 NOTE — ED Provider Notes (Signed)
[facility] Emergency Department Provider Note  ____________________________________________  Time seen: Approximately 314 AM  I have reviewed the triage vital signs and the nursing notes.   HISTORY  Chief Complaint Shortness of Breath and Diarrhea    HPI Deanna Rice is a 59 y.o. female who comes into the ED today not feeling well. Per EMS the patient called out for problems with her diabetes. The patient's FSBS though per EMS was 192. EMS reports that the noticed the patient covered in feces when they arrived. The patient reports that the has been having diarrhea today and has been unable to control it. The patient reports that she can no longer control her bowels. The patient did have some SOB and chest pain but does not have any of those symptoms currently. The patient reports that she simply did not feel well. EMS reports that she was able to stand on her own. The patient denies any dizziness with her symptoms just that she felt bad for the last couple of days. She denies any headache or blurred vision. The patient has not been on any antibiotics recently.    Past Medical History  Diagnosis Date  . COPD (chronic obstructive pulmonary disease) (HCC)   . Diabetes mellitus without complication (HCC)   . Hypertension   . CHF (congestive heart failure) (HCC)     There are no active problems to display for this patient.   Past surgical history. Knee surgery  No current outpatient prescriptions on file.  Allergies Review of patient's allergies indicates no known allergies.  History reviewed. No pertinent family history.  Social History Social History  Substance Use Topics  . Smoking status: Never Smoker   . Smokeless tobacco: None  . Alcohol Use: No    Review of Systems Constitutional: No fever/chills Eyes: No visual changes. ENT: No sore throat. Cardiovascular: chest pain. Respiratory:  shortness of breath. Gastrointestinal: Diarrhea with  no abdominal pain.  No nausea, no vomiting.  No constipation. Genitourinary: Negative for dysuria. Musculoskeletal: Negative for back pain. Skin: Negative for rash. Neurological: Negative for headaches, focal weakness or numbness.  10-point ROS otherwise negative.  ____________________________________________   PHYSICAL EXAM:  VITAL SIGNS: ED Triage Vitals  Enc Vitals Group     BP 11/28/14 0324 87/68 mmHg     Pulse Rate 11/28/14 0320 71     Resp 11/28/14 0320 14     Temp 11/28/14 0356 96.4 F (35.8 C)     Temp Source 11/28/14 0356 Rectal     SpO2 11/28/14 0320 95 %     Weight 11/28/14 0320 396 lb 4 oz (179.738 kg)     Height 11/28/14 0320  (1.676 m)     Head Cir --      Peak Flow --      Pain Score --      Pain Loc --      Pain Edu? --      Excl. in GC? --     Constitutional: Alert and oriented. Well appearing and in no acute distress. Eyes: Conjunctivae are normal. PERRL. EOMI. Head: Atraumatic. Nose: No congestion/rhinnorhea. Mouth/Throat: Mucous membranes are moist.  Oropharynx non-erythematous. Cardiovascular: Normal rate, regular rhythm. Grossly normal heart sounds.  Good peripheral circulation. Respiratory: Normal respiratory effort.  No retractions. Lungs CTAB. Gastrointestinal: Soft and nontender, obese. No distention. Quiet bowel sounds Musculoskeletal: No lower extremity tenderness nor edema.   Neurologic:  Normal speech and language. Skin:  Skin is warm, dry, ulcer to  left shin and posterior leg.  Psychiatric: Mood and affect are normal.  ____________________________________________   LABS (all labs ordered are listed, but only abnormal results are displayed)  Labs Reviewed  CBC - Abnormal; Notable for the following:    WBC 16.0 (*)    MCHC 31.6 (*)    RDW 16.8 (*)    All other components within normal limits  COMPREHENSIVE METABOLIC PANEL - Abnormal; Notable for the following:    Sodium 134 (*)    CO2 21 (*)    Glucose, Bld 194 (*)    BUN  60 (*)    Creatinine, Ser 2.39 (*)    Calcium 8.8 (*)    Alkaline Phosphatase 135 (*)    GFR calc non Af Amer 21 (*)    GFR calc Af Amer 24 (*)    All other components within normal limits  LACTIC ACID, PLASMA - Abnormal; Notable for the following:    Lactic Acid, Venous 2.4 (*)    All other components within normal limits  URINALYSIS COMPLETEWITH MICROSCOPIC ([facility] ONLY) - Abnormal; Notable for the following:    Color, Urine YELLOW (*)    APPearance CLEAR (*)    All other components within normal limits  C DIFFICILE QUICK SCREEN W PCR REFLEX  CULTURE, BLOOD (ROUTINE X 2)  CULTURE, BLOOD (ROUTINE X 2)  STOOL CULTURE  TROPONIN I  LACTIC ACID, PLASMA  URINALYSIS COMPLETEWITH MICROSCOPIC ([facility] ONLY)  GI PATHOGEN PANEL BY PCR, STOOL   ____________________________________________  EKG  ED ECG REPORT I, Rebecka Apley, the attending physician, personally viewed and interpreted this ECG.   Date: 11/28/2014  EKG Time: 437  Rate: 77  Rhythm: normal sinus rhythm  Axis: normal  Intervals:none  ST&T Change: none  ____________________________________________  RADIOLOGY  CXR: mild basilar opacities may reflect atelectasis, or possibly mild pneumonia ____________________________________________   PROCEDURES  Procedure(s) performed: None  Critical Care performed: Yes, see critical care note(s)  CRITICAL CARE Performed by: Lucrezia Europe P   Total critical care time: 30 min  Critical care time was exclusive of separately billable procedures and treating other patients.  Critical care was necessary to treat or prevent imminent or life-threatening deterioration.  Critical care was time spent personally by me on the following activities: development of treatment plan with patient and/or surrogate as well as nursing, discussions with consultants, evaluation of patient's response to treatment, examination of patient, obtaining history from patient or surrogate,  ordering and performing treatments and interventions, ordering and review of laboratory studies, ordering and review of radiographic studies, pulse oximetry and re-evaluation of patient's condition.  ____________________________________________   INITIAL IMPRESSION / ASSESSMENT AND PLAN / ED COURSE  Pertinent labs & imaging results that were available during my care of the patient were reviewed by me and considered in my medical decision making (see chart for details).  This is a 59 year old female who comes in today with diarrhea and not feeling well. When the patient initially arrived her blood pressure was in the 80s systolic. The patient was also covered in feces. The patient's nurse and multiple other nurses helped clean her up and we did start a line with some fluids. We also placed a Foley catheter given the amount of fecal matter in the patient's genital area. The patient was found to level to count was 16 and her blood pressure did improve with the initial liter of normal saline. Since the patient does not have any abdominal pain and did not do  a CT scan of her abdomen but I will give the patient some level floxacillin given her chest x-ray showing pneumonia as well as her diarrhea and some metronidazole. The patient had a C. difficile ordered but was negative. We'll also do some stool cultures by PCR as well as evaluate for other bacterial causes of diarrhea. The patient has some acute kidney injury so she'll be admitted to the hospitalist service for continued hydration and treatment of her diarrhea. ____________________________________________   FINAL CLINICAL IMPRESSION(S) / ED DIAGNOSES  Final diagnoses:  Diarrhea of infectious origin  Community acquired pneumonia  Acute kidney injury (HCC)  Dehydration  Lactic acidosis      Rebecka Apley, MD 11/28/14 [id number]

## 2014-11-28 NOTE — ED Notes (Signed)
Pt continues to deny pain, skin color remains pale but is improved. Dr. Zenda Alpers in to examine pt for 2nd time.

## 2014-11-28 NOTE — Consult Note (Signed)
ANTIBIOTIC CONSULT NOTE - INITIAL  Pharmacy Consult for levofloxacin Indication: diarrhea/possible CAP  No Known Allergies  Patient Measurements: Height:  (167.6 cm) Weight: (!) 396 lb 4 oz (179.738 kg) IBW/kg (Calculated) : 59.3 Adjusted Body Weight:   Vital Signs: Temp: 98.1 F (36.7 C) (10/03 0811) Temp Source: Oral (10/03 0811) BP: 134/59 mmHg (10/03 0811) Pulse Rate: 80 (10/03 0811) Intake/Output from previous day:   Intake/Output from this shift:    Labs:  Recent Labs  11/28/14 0329  WBC 16.0*  HGB 13.3  PLT 310  CREATININE 2.39*   Estimated Creatinine Clearance: 42.5 mL/min (by C-G formula based on Cr of 2.39). No results for input(s): VANCOTROUGH, VANCOPEAK, VANCORANDOM, GENTTROUGH, GENTPEAK, GENTRANDOM, TOBRATROUGH, TOBRAPEAK, TOBRARND, AMIKACINPEAK, AMIKACINTROU, AMIKACIN in the last 72 hours.   Microbiology: Recent Results (from the past 720 hour(s))  C difficile quick scan w PCR reflex     Status: None   Collection Time: 11/28/14  3:31 AM  Result Value Ref Range Status   C Diff antigen NEGATIVE NEGATIVE Final   C Diff toxin NEGATIVE NEGATIVE Final   C Diff interpretation Negative for C. difficile  Final    Medical History: Past Medical History  Diagnosis Date  . COPD (chronic obstructive pulmonary disease) (HCC)   . Diabetes mellitus without complication (HCC)   . Hypertension   . CHF (congestive heart failure) (HCC)     Medications:  Scheduled:  . aspirin EC  81 mg Oral Daily  . heparin  5,000 Units Subcutaneous 3 times per day  . insulin aspart  0-15 Units Subcutaneous TID WC  . ipratropium  0.5 mg Nebulization Q6H  . levofloxacin (LEVAQUIN) IV  750 mg Intravenous Once  . metronidazole  500 mg Intravenous Q8H  . sodium chloride  1,000 mL Intravenous Once  . sodium chloride  3 mL Intravenous Q12H   Assessment: Pt is a 59 year old female admitted for diarrhea. Pt is cdiff neg, chest x ray shows possible pneumonia. Pt has received 1  dose of levaquin  IV. Pharmacy consulted to dose levofloxacin.   Goal of Therapy:  resolution of infection  Plan:  due to pt renal function, will transition patient to levofloxacin  q 48 hours. pharmacy to continue to monitor  Lauria Depoy D Dalten Ambrosino 11/28/2014,8:24 AM

## 2014-11-28 NOTE — ED Notes (Signed)
Pt reports improved warmth. Pt no longer shivering. Pt continues to states "i feel better". Skin color improving.

## 2014-11-28 NOTE — H&P (Signed)
[facility] [place] at [facility]   PATIENT NAME: Deanna Rice    MR#:  [id number]  DATE OF BIRTH:  May 25, 1955  DATE OF ADMISSION:  11/28/2014  PRIMARY CARE PHYSICIAN: Lyndon Code, MD   REQUESTING/REFERRING PHYSICIAN: Dr. Zenda Alpers  CHIEF COMPLAINT:   Chief Complaint  Patient presents with  . Shortness of Breath  . Diarrhea    HISTORY OF PRESENT ILLNESS:  Deanna Rice  is a 59 y.o. female with a known history of COPD, hypertension, diabetes mellitus type 2, congestive heart failure presents to the emergency room with the complaints of acute onset of profuse diarrhea which started last night. Denies any nausea, vomiting, abdominal pain, fever, cough, chest pain, dizziness, dysuria. No blood in stools. She also had mild shortness of breath on arrival which she thinks that is because of her position in bed(lying down). She mentioned that she was doing-absolutely fine before the onset of diarrhea last night. History of antibiotic use about 2 weeks ago for chronic leg ulcers. She was hypotensive and also hypothermic on arrival, received IV normal saline bolus and was placed on heating blanket. Workup revealed elevated lipase of 16.0, BUN and creatinine 60/2.39, alkaline phosphatase 135, troponin less than 0.03, elevated lactic acid of 2.4. Chest x-ray bibasilar opacities may reflect atelectasis versus mild pneumonia. C. difficile toxin and PCR negative. EKG normal sinus rhythm with ventricular rate of 77 bpm, no acute ischemic changes. Patient was placed on contact isolation with enteric precautions and after obtaining blood and stool cultures was started on IV antibiotics-levofloxacin and Flagyl. Hospitalist service was consulted for further management. At the current time patient is comfortably resting in the bed and states she is feeling slightly better.  PAST MEDICAL HISTORY:   Past Medical History  Diagnosis Date  . COPD (chronic obstructive pulmonary disease) (HCC)    . Diabetes mellitus without complication (HCC)   . Hypertension   . CHF (congestive heart failure) (HCC)     PAST SURGICAL HISTORY:  History reviewed. No pertinent past surgical history.  SOCIAL HISTORY:   Social History  Substance Use Topics  . Smoking status: Never Smoker   . Smokeless tobacco: Not on file  . Alcohol Use: No    FAMILY HISTORY:   Family History  Problem Relation Age of Onset  . Hypertension Mother   . Diabetes Mother   . CAD Mother   . CAD Father   . Diabetes Father   . Hypertension Father   . Hypertension Sister   . Diabetes Sister   . CAD Sister     DRUG ALLERGIES:  No Known Allergies  REVIEW OF SYSTEMS:   Review of Systems  Constitutional: Positive for malaise/fatigue. Negative for fever and chills.  HENT: Negative for ear pain, hearing loss, nosebleeds, sore throat and tinnitus.   Eyes: Negative for blurred vision, double vision, pain, discharge and redness.  Respiratory: Positive for shortness of breath. Negative for cough, hemoptysis, sputum production and wheezing.   Cardiovascular: Negative for chest pain, palpitations, orthopnea and leg swelling.  Gastrointestinal: Positive for diarrhea. Negative for nausea, vomiting, abdominal pain, constipation, blood in stool and melena.  Genitourinary: Negative for dysuria, urgency, frequency and hematuria.  Musculoskeletal: Negative for back pain, joint pain and neck pain.  Skin: Negative for itching and rash.  Neurological: Negative for dizziness, tingling, sensory change, focal weakness and seizures.  Endo/Heme/Allergies: Does not bruise/bleed easily.  Psychiatric/Behavioral: Negative for depression. The patient is not nervous/anxious.     MEDICATIONS  AT HOME:   Prior to Admission medications   Not on File      VITAL SIGNS:  Blood pressure 104/53, pulse 77, temperature 97.6 F (36.4 C), temperature source Rectal, resp. rate 17, height  (1.676 m), weight 179.738 kg (396 lb 4 oz),  SpO2 100 %.  PHYSICAL EXAMINATION:  Physical Exam  Constitutional: She is oriented to person, place, and time. She appears well-developed and well-nourished. No distress.  HENT:  Head: Normocephalic and atraumatic.  Right Ear: External ear normal.  Left Ear: External ear normal.  Nose: Nose normal.  Mouth/Throat: Oropharynx is clear and moist. No oropharyngeal exudate.  Eyes: EOM are normal. Pupils are equal, round, and reactive to light. No scleral icterus.  Neck: Normal range of motion. Neck supple. No JVD present. No thyromegaly present.  Cardiovascular: Normal rate, regular rhythm, normal heart sounds and intact distal pulses.  Exam reveals no friction rub.   No murmur heard. Respiratory: Effort normal and breath sounds normal. No respiratory distress. She has no wheezes. She has no rales. She exhibits no tenderness.  GI: Soft. Bowel sounds are normal. She exhibits no distension and no mass. There is no tenderness. There is no rebound and no guarding.  Musculoskeletal: Normal range of motion. She exhibits no edema.  Lymphadenopathy:    She has no cervical adenopathy.  Neurological: She is alert and oriented to person, place, and time. She has normal reflexes. She displays normal reflexes. No cranial nerve deficit. She exhibits normal muscle tone.  Skin: Skin is warm. No rash noted. No erythema.  Superficial ulcers over both lower extremities +  Psychiatric: She has a normal mood and affect. Her behavior is normal. Thought content normal.   LABORATORY PANEL:   CBC  Recent Labs Lab 11/28/14 0329  WBC 16.0*  HGB 13.3  HCT 42.1  PLT 310   ------------------------------------------------------------------------------------------------------------------  Chemistries   Recent Labs Lab 11/28/14 0329  NA 134*  K 4.9  CL 103  CO2 21*  GLUCOSE 194*  BUN 60*  CREATININE 2.39*  CALCIUM 8.8*  AST 17  ALT 14  ALKPHOS 135*  BILITOT 0.4    ------------------------------------------------------------------------------------------------------------------  Cardiac Enzymes  Recent Labs Lab 11/28/14 0329  TROPONINI <0.03   ------------------------------------------------------------------------------------------------------------------  RADIOLOGY:  Dg Chest Portable 1 View  11/28/2014   CLINICAL DATA:  Acute onset of diarrhea and shortness of breath. Initial encounter.  EXAM: PORTABLE CHEST 1 VIEW  COMPARISON:  Chest radiograph from 07/12/2010  FINDINGS: The lungs are well-aerated. Mild bibasilar opacities may reflect atelectasis or possibly mild pneumonia. There is no evidence of pleural effusion or pneumothorax.  The cardiomediastinal silhouette is within normal limits. No acute osseous abnormalities are seen.  IMPRESSION: Mild bibasilar opacities may reflect atelectasis or possibly mild pneumonia.   Electronically Signed   By: Roanna Raider M.D.   On: 11/28/2014 05:11    EKG:   Orders placed or performed during the hospital encounter of 11/28/14  . ED EKG  . ED EKG  . EKG 12-Lead  . EKG 12-Lead  Normal sinus rhythm with ventricular rate of 77 bpm, no acute ischemic changes.  IMPRESSION AND PLAN:   1. Acute onset of profuse diarrhea. No abdominal pain. No blood in stools. Elevated WBC count. Likely infectious etiology. 2. Acute kidney injury possibly secondary to dehydration from diarrhea. 3. Bibasilar opacities on chest x-ray-likely atelectasis, possible pneumonia though less likely. No history of cough fever. 4. COPD, stable no acute exacerbation. 5. Diabetes mellitus type 2, stable.  6. Hypertension. BP soft.  Plan: Admit, contact isolation with enteric precautions, IV fluids, continue IV antiemetics IV levofloxacin and Flagyl, follow-up CBC, BMP, blood in stool cultures. Nephrology consultation for advice regarding acute kidney injury. Obtain list of home medications and start accordingly. Sliding scale  insulin, follow blood sugars closely.  DVT prophylaxis: Subcutaneous heparin    All the records are reviewed and case discussed with ED provider. Management plans discussed with the patient and  in agreement.  CODE STATUS: Full code  TOTAL TIME TAKING CARE OF THIS PATIENT: 50 minutes.    Jonnie Kind N M.D on 11/28/2014 at 7:03 AM  Between 7am to 6pm - Pager - [phone]  After 6pm go to [email or web address] - password [email or web address] [facility]  [facility]  Office  [phone]  CC: Primary care physician; Lyndon Code, MD

## 2014-11-29 ENCOUNTER — Inpatient Hospital Stay: Payer: Medicaid Other

## 2014-11-29 LAB — GLUCOSE, CAPILLARY
Glucose-Capillary: 145 mg/dL — ABNORMAL HIGH (ref 65–99)
Glucose-Capillary: 109 mg/dL — ABNORMAL HIGH (ref 65–99)
Glucose-Capillary: 156 mg/dL — ABNORMAL HIGH (ref 65–99)

## 2014-11-29 LAB — CBC
HCT: 27.5 % — ABNORMAL LOW (ref 35.0–47.0)
HEMATOCRIT: 26.1 % — AB (ref 35.0–47.0)
HEMOGLOBIN: 8.6 g/dL — AB (ref 12.0–16.0)
HEMOGLOBIN: 9 g/dL — AB (ref 12.0–16.0)
MCH: 29.7 pg (ref 26.0–34.0)
MCH: 29.8 pg (ref 26.0–34.0)
MCHC: 33 g/dL (ref 32.0–36.0)
MCHC: 32.7 g/dL (ref 32.0–36.0)
MCV: 90.1 fL (ref 80.0–100.0)
MCV: 91.1 fL (ref 80.0–100.0)
Platelets: 138 10*3/uL — ABNORMAL LOW (ref 150–440)
Platelets: 140 10*3/uL — ABNORMAL LOW (ref 150–440)
RBC: 2.9 MIL/uL — ABNORMAL LOW (ref 3.80–5.20)
RBC: 3.02 MIL/uL — AB (ref 3.80–5.20)
RDW: 16.6 % — ABNORMAL HIGH (ref 11.5–14.5)
RDW: 16.8 % — ABNORMAL HIGH (ref 11.5–14.5)
WBC: 8.6 10*3/uL (ref 3.6–11.0)
WBC: 6.4 10*3/uL (ref 3.6–11.0)

## 2014-11-29 LAB — BASIC METABOLIC PANEL
ANION GAP: 8 (ref 5–15)
BUN: 60 mg/dL — ABNORMAL HIGH (ref 6–20)
CO2: 24 mmol/L (ref 22–32)
Calcium: 8.6 mg/dL — ABNORMAL LOW (ref 8.9–10.3)
Chloride: 110 mmol/L (ref 101–111)
Creatinine, Ser: 1.75 mg/dL — ABNORMAL HIGH (ref 0.44–1.00)
GFR calc Af Amer: 35 mL/min — ABNORMAL LOW (ref >60)
GFR, EST NON AFRICAN AMERICAN: 30 mL/min — AB (ref >60)
GLUCOSE: 94 mg/dL (ref 65–99)
POTASSIUM: 4.3 mmol/L (ref 3.5–5.1)
Sodium: 142 mmol/L (ref 135–145)

## 2014-11-29 NOTE — Care Management (Signed)
Patient up for discharge this afternoon. Contacted Robert [facility]ens CAP at [employer] [phone].  Patient will need to be transported by [employer] bus. Patient does not have her bariatric Wheel chair or home O2 portable tank.  EMS transport would be possible at patient expense, patient low income and this would present financial hardship. CAP social worker Molly Maduro stated that he was arranging [facility] and they had agreed to pick patient up. Robert agreed to go to patient home and pick up  Her portable O2 tank if [facility] could loan a bariatric WC.  Social worker York Spaniel helping arrange transportation contacted Nursing supervisor Annice Pih who agreed to lone wheelchair to DSS upon guarantee of Robert Linens that it would be returned promptly after patient transported. Feliberto Gottron contacted at [facility] to notify of patient impending discharge.

## 2014-11-29 NOTE — Care Management (Signed)
Spoke with Judeth Cornfield at PT who evaluated patient today. Recommends bariatric walker.  Contacted MD for DME order. Resume home health with [facility]. RN Aid.

## 2014-11-29 NOTE — Discharge Summary (Signed)
[facility] - [place] at [facility]   PATIENT NAME: Deanna Rice    MR#:  [id number]  DATE OF BIRTH:  1955/08/18  DATE OF ADMISSION:  11/28/2014 ADMITTING PHYSICIAN: Altamese Dilling, MD  DATE OF DISCHARGE: 11/29/2014  PRIMARY CARE PHYSICIAN: Lyndon Code, MD    ADMISSION DIAGNOSIS:  Dehydration [E86.0] Lactic acidosis [E87.2] Diarrhea of infectious origin [A09] Community acquired pneumonia [J18.9] Acute kidney injury (HCC) [N17.9]  DISCHARGE DIAGNOSIS:  Principal Problem:   Diarrhea Active Problems:   AKI (acute kidney injury) (HCC)   COPD (chronic obstructive pulmonary disease) (HCC)   DM (diabetes mellitus) (HCC)   HTN (hypertension)   SECONDARY DIAGNOSIS:   Past Medical History  Diagnosis Date  . COPD (chronic obstructive pulmonary disease) (HCC)   . Diabetes mellitus without complication (HCC)   . Hypertension   . CHF (congestive heart failure) [facility])     HOSPITAL COURSE:   * Acute Renal Failure with metabolic acidosis:  Acidosis seems to be more from acute renal failure than ischemic bowel.  - Acute renal failure from acute volume loss from diarrhea.   IV fluids.  - Continue to monitor volume status, urine output and renal function - Check renal ultrasound.  - Appreciated help of nephrology.  * Diarrhea- C diff negative.  She had antibiotics 2 weeks ago for ulcer on leg- likely reactive diarrhea.  No further intervention, IV fluids.  Stop flagyl. Pending stool culture.  * Bibasilar opacities on chest x-ray-likely atelectasis, suspected pneumonia on admisison though less likely. No history of cough fever, will stop levaquin.  WBCs are high- may be viral enteritis or reactive.  * COPD, with Chronic oxygen use-stable no acute exacerbation, cont home inhalers.     Respi rate was high momentorily initially- may be explained by anxiety.  * Diabetes mellitus type 2, stable.  * Htn- resume home  meds.  DISCHARGE CONDITIONS:   Stable.  CONSULTS OBTAINED:  Treatment Team:  Lamont Dowdy, MD  DRUG ALLERGIES:  No Known Allergies  DISCHARGE MEDICATIONS:   Current Discharge Medication List    CONTINUE these medications which have NOT CHANGED   Details  allopurinol (ZYLOPRIM) 100 MG tablet Take 100 mg by mouth daily.    amitriptyline (ELAVIL) 50 MG tablet Take 50 mg by mouth at bedtime.    benazepril (LOTENSIN) 10 MG tablet Take 10 mg by mouth daily.    diazepam (VALIUM) 5 MG tablet Take 5 mg by mouth 2 (two) times daily as needed for anxiety.    Fluticasone-Salmeterol (ADVAIR) 250-50 MCG/DOSE AEPB Inhale 1 puff into the lungs 2 (two) times daily.    glyBURIDE-metformin (GLUCOVANCE) 5-500 MG tablet Take 2 tablets by mouth 2 (two) times daily with a meal.    HYDROcodone-acetaminophen (NORCO/VICODIN) 5-325 MG tablet Take 1 tablet by mouth 2 (two) times daily as needed for moderate pain.    indomethacin (INDOCIN) 50 MG capsule Take 50 mg by mouth 2 (two) times daily as needed.    insulin NPH-regular Human (NOVOLIN 70/30) (70-30) 100 UNIT/ML injection Inject 45 Units into the skin 2 (two) times daily with a meal.    Ipratropium-Albuterol (COMBIVENT RESPIMAT) 20-100 MCG/ACT AERS respimat Inhale 1 puff into the lungs every 6 (six) hours.    isosorbide mononitrate (IMDUR) 60 MG 24 hr tablet Take 60 mg by mouth 2 (two) times daily.    Liraglutide (VICTOZA [place]) Inject 1.8 mg into the skin every morning.    metoprolol succinate (TOPROL-XL) 50 MG 24 hr tablet  Take 50 mg by mouth daily. Take with or immediately following a meal.    nystatin (MYCOSTATIN/NYSTOP) 100000 UNIT/GM POWD Apply topically 6 (six) times daily.    omeprazole (PRILOSEC) 20 MG capsule Take 20 mg by mouth 2 (two) times daily before a meal.    potassium chloride (K-DUR,KLOR-CON) 10 MEQ tablet Take 10 mEq by mouth 2 (two) times a week.    simvastatin (ZOCOR) 80 MG tablet Take 80 mg by mouth daily at 6 PM.     traMADol (ULTRAM) 50 MG tablet Take 50 mg by mouth 3 (three) times daily as needed.      STOP taking these medications     furosemide (LASIX) 40 MG tablet          DISCHARGE INSTRUCTIONS:    Follow with PMD in 1 week to check renal function.  If you experience worsening of your admission symptoms, develop shortness of breath, life threatening emergency, suicidal or homicidal thoughts you must seek medical attention immediately by calling 911 or calling your MD immediately  if symptoms less severe.  You Must read complete instructions/literature along with all the possible adverse reactions/side effects for all the Medicines you take and that have been prescribed to you. Take any new Medicines after you have completely understood and accept all the possible adverse reactions/side effects.   Please note  You were cared for by a hospitalist during your hospital stay. If you have any questions about your discharge medications or the care you received while you were in the hospital after you are discharged, you can call the unit and asked to speak with the hospitalist on call if the hospitalist that took care of you is not available. Once you are discharged, your primary care physician will handle any further medical issues. Please note that NO REFILLS for any discharge medications will be authorized once you are discharged, as it is imperative that you return to your primary care physician (or establish a relationship with a primary care physician if you do not have one) for your aftercare needs so that they can reassess your need for medications and monitor your lab values.    Today   CHIEF COMPLAINT:   Chief Complaint  Patient presents with  . Shortness of Breath  . Diarrhea    HISTORY OF PRESENT ILLNESS:  Deanna Rice  is a 59 y.o. female with a known history of COPD, hypertension, diabetes mellitus type 2, congestive heart failure presents to the emergency room with the  complaints of acute onset of profuse diarrhea which started last night. Denies any nausea, vomiting, abdominal pain, fever, cough, chest pain, dizziness, dysuria. No blood in stools. She also had mild shortness of breath on arrival which she thinks that is because of her position in bed(lying down). She mentioned that she was doing-absolutely fine before the onset of diarrhea last night. History of antibiotic use about 2 weeks ago for chronic leg ulcers. She was hypotensive and also hypothermic on arrival, received IV normal saline bolus and was placed on heating blanket. Workup revealed elevated lipase of 16.0, BUN and creatinine 60/2.39, alkaline phosphatase 135, troponin less than 0.03, elevated lactic acid of 2.4. Chest x-ray bibasilar opacities may reflect atelectasis versus mild pneumonia. C. difficile toxin and PCR negative. EKG normal sinus rhythm with ventricular rate of 77 bpm, no acute ischemic changes. Patient was placed on contact isolation with enteric precautions and after obtaining blood and stool cultures was started on IV antibiotics-levofloxacin and Flagyl. Hospitalist service  was consulted for further management. At the current time patient is comfortably resting in the bed and states she is feeling slightly better.   VITAL SIGNS:  Blood pressure 148/59, pulse 91, temperature 98 F (36.7 C), temperature source Oral, resp. rate 17, height  (1.676 m), weight 171.913 kg (379 lb), SpO2 100 %.  I/O:   Intake/Output Summary (Last 24 hours) at 11/29/14 1425 Last data filed at 11/29/14 1200  Gross per 24 hour  Intake    799 ml  Output   7050 ml  Net  -6251 ml    PHYSICAL EXAMINATION:   GENERAL: 59 y.o.-year-old morbidly obase patient lying in the bed with no acute distress.  EYES: Pupils equal, round, reactive to light and accommodation. No scleral icterus. Extraocular muscles intact.  HEENT: Head atraumatic, normocephalic. Oropharynx and nasopharynx clear.  NECK: Supple,  no jugular venous distention. No thyroid enlargement, no tenderness.  LUNGS: Normal breath sounds bilaterally, no wheezing, rales,rhonchi or crepitation. No use of accessory muscles of respiration.  CARDIOVASCULAR: S1, S2 normal. No murmurs, rubs, or gallops.  ABDOMEN: Soft, nontender, nondistended. Bowel sounds present. No organomegaly or mass.  EXTREMITIES: positive pedal edema, no cyanosis, or clubbing.  NEUROLOGIC: Cranial nerves II through XII are intact. Muscle strength 5/5 in all extremities. Sensation intact. Gait not checked.  PSYCHIATRIC: The patient is alert and oriented x 3.  SKIN: On left leg a dry ulcer present, no discharge/ surrounding redness- size 2X3 cm on lower shin of tibia.  DATA REVIEW:   CBC  Recent Labs Lab 11/29/14 0430  WBC 8.6  HGB 8.6*  HCT 26.1*  PLT 138*    Chemistries   Recent Labs Lab 11/28/14 0329 11/29/14 0430  NA 134* 142  K 4.9 4.3  CL 103 110  CO2 21* 24  GLUCOSE 194* 94  BUN 60* 60*  CREATININE 2.39* 1.75*  CALCIUM 8.8* 8.6*  AST 17  --   ALT 14  --   ALKPHOS 135*  --   BILITOT 0.4  --     Cardiac Enzymes  Recent Labs Lab 11/28/14 0329  TROPONINI <0.03    Microbiology Results  Results for orders placed or performed during the hospital encounter of 11/28/14  Culture, blood (routine x 2)     Status: None (Preliminary result)   Collection Time: 11/28/14  3:29 AM  Result Value Ref Range Status   Specimen Description BLOOD RIGHT FACE  Final   Special Requests BOTTLES DRAWN AEROBIC AND ANAEROBIC 4CC  Final   Culture NO GROWTH < 12 HOURS  Final   Report Status PENDING  Incomplete  Culture, blood (routine x 2)     Status: None (Preliminary result)   Collection Time: 11/28/14  3:30 AM  Result Value Ref Range Status   Specimen Description BLOOD LEFT HAND  Final   Special Requests BOTTLES DRAWN AEROBIC AND ANAEROBIC 4CC  Final   Culture NO GROWTH < 12 HOURS  Final   Report Status PENDING  Incomplete  C difficile  quick scan w PCR reflex     Status: None   Collection Time: 11/28/14  3:31 AM  Result Value Ref Range Status   C Diff antigen NEGATIVE NEGATIVE Final   C Diff toxin NEGATIVE NEGATIVE Final   C Diff interpretation Negative for C. difficile  Final  Stool culture     Status: None (Preliminary result)   Collection Time: 11/28/14  3:31 AM  Result Value Ref Range Status   Specimen Description STOOL  Final   Special Requests Normal  Final   Culture   Final    No Pathogenic E. coli detected HOLDING FOR POSSIBLE PATHOGEN NO CAMPYLOBACTER DETECTED    Report Status PENDING  Incomplete    RADIOLOGY:  US Renal  11/29/2014   CLINICAL DATA:  Acute renal failure  EXAM: RENAL / URINARY TRACT ULTRASOUND COMPLETE  COMPARISON:  None.  FINDINGS: Right Kidney:  Length: 12.6 cm. Echogenicity within normal limits. No mass or hydronephrosis visualized.  Left Kidney:  Length: 12.9 cm. Echogenicity within normal limits. No mass or hydronephrosis visualized.  Bladder:  Decompressed by Foley catheter  IMPRESSION: No acute abnormality noted.   Electronically Signed   By: Alcide Clever M.D.   On: 11/29/2014 10:06   Dg Chest Portable 1 View  11/28/2014   CLINICAL DATA:  Acute onset of diarrhea and shortness of breath. Initial encounter.  EXAM: PORTABLE CHEST 1 VIEW  COMPARISON:  Chest radiograph from 07/12/2010  FINDINGS: The lungs are well-aerated. Mild bibasilar opacities may reflect atelectasis or possibly mild pneumonia. There is no evidence of pleural effusion or pneumothorax.  The cardiomediastinal silhouette is within normal limits. No acute osseous abnormalities are seen.  IMPRESSION: Mild bibasilar opacities may reflect atelectasis or possibly mild pneumonia.   Electronically Signed   By: Roanna Raider M.D.   On: 11/28/2014 05:11     Management plans discussed with the patient, family and they are in agreement.  CODE STATUS:     Code Status Orders        Start     Ordered   11/28/14 0822  Full code    Continuous     11/28/14 0821    Advance Directive Documentation        Most Recent Value   Type of Advance Directive  -- [facility] DNR form present]   Pre-existing out of facility DNR order (yellow form or pink MOST form)     "MOST" Form in Place?        TOTAL TIME TAKING CARE OF THIS PATIENT: 35 minutes.    Altamese Dilling M.D on 11/29/2014 at 2:25 PM  Between 7am to 6pm - Pager - [phone]  After 6pm go to [email or web address] - password [email or web address] [facility]  [place] [facility]  Office  [phone]  CC: Primary care physician; Lyndon Code, MD   Note: This dictation was prepared with Dragon dictation along with smaller phrase technology. Any transcriptional errors that result from this process are unintentional.

## 2014-11-29 NOTE — Care Management (Signed)
Spoke with patient for discharge planning. Patient is from home and has power chair and motorized WC for mobility. She receives services from [facility] and has home O2. Patient stated that she does her own [facility] care [facility]. Patient recalled speaking with her CAP rep yesterday Robert Linens who was here for a visit.  Anticipate discharge to home with home health services.

## 2014-11-29 NOTE — Progress Notes (Signed)
[facility]  ROUNDING NOTE   Subjective:   Eating lunch.  Creatinine 1.75 (2.39)  UOP 2850  Objective:  Vital signs in last 24 hours:  Temp:  [98 F (36.7 C)-98.8 F (37.1 C)] 98 F (36.7 C) (10/04 1157) Pulse Rate:  [79-91] 91 (10/04 1157) Resp:  [17-19] 17 (10/04 1157) BP: (124-148)/(59-98) 148/59 mmHg (10/04 1157) SpO2:  [98 %-100 %] 100 % (10/04 1157) Weight:  [171.913 kg (379 lb)] 171.913 kg (379 lb) (10/04 0500)  Weight change: -7.825 kg (-17 lb 4 oz) Filed Weights   11/28/14 0320 11/29/14 0500  Weight: 179.738 kg (396 lb 4 oz) 171.913 kg (379 lb)    Intake/Output: I/O last 3 completed shifts: In: 1208 [P.O.:240; I.V.:918; IV Piggyback:50] Out: 5270 [Urine:5270]   Intake/Output this shift:  Total I/O In: 340 [P.O.:340] Out: 2850 [Urine:2850]  Physical Exam: General: NAD  Head: Normocephalic, atraumatic. Moist oral mucosal membranes  Eyes: Anicteric, PERRL  Neck: Supple, trachea midline  Lungs:  Clear to auscultation  Heart: Regular rate and rhythm  Abdomen:  Soft, nontender,   Extremities: no peripheral edema.  Neurologic: Nonfocal, moving all four extremities  Skin: No lesions  GU foley    Basic Metabolic Panel:  Recent Labs Lab 11/28/14 0329 11/29/14 0430  NA 134* 142  K 4.9 4.3  CL 103 110  CO2 21* 24  GLUCOSE 194* 94  BUN 60* 60*  CREATININE 2.39* 1.75*  CALCIUM 8.8* 8.6*    Liver Function Tests:  Recent Labs Lab 11/28/14 0329  AST 17  ALT 14  ALKPHOS 135*  BILITOT 0.4  PROT 7.5  ALBUMIN 4.0   No results for input(s): LIPASE, AMYLASE in the last 168 hours. No results for input(s): AMMONIA in the last 168 hours.  CBC:  Recent Labs Lab 11/28/14 0329 11/29/14 0430  WBC 16.0* 8.6  HGB 13.3 8.6*  HCT 42.1 26.1*  MCV 92.3 90.1  PLT 310 138*    Cardiac Enzymes:  Recent Labs Lab 11/28/14 0329  TROPONINI <0.03    BNP: Invalid input(s): POCBNP  CBG:  Recent Labs Lab 11/28/14 1124  11/28/14 1603 11/28/14 2146 11/29/14 0726 11/29/14 1119  GLUCAP 245* 306* 145* 109* 156*    Microbiology: Results for orders placed or performed during the hospital encounter of 11/28/14  Culture, blood (routine x 2)     Status: None (Preliminary result)   Collection Time: 11/28/14  3:29 AM  Result Value Ref Range Status   Specimen Description BLOOD RIGHT FACE  Final   Special Requests BOTTLES DRAWN AEROBIC AND ANAEROBIC 4CC  Final   Culture NO GROWTH < 12 HOURS  Final   Report Status PENDING  Incomplete  Culture, blood (routine x 2)     Status: None (Preliminary result)   Collection Time: 11/28/14  3:30 AM  Result Value Ref Range Status   Specimen Description BLOOD LEFT HAND  Final   Special Requests BOTTLES DRAWN AEROBIC AND ANAEROBIC 4CC  Final   Culture NO GROWTH < 12 HOURS  Final   Report Status PENDING  Incomplete  C difficile quick scan w PCR reflex     Status: None   Collection Time: 11/28/14  3:31 AM  Result Value Ref Range Status   C Diff antigen NEGATIVE NEGATIVE Final   C Diff toxin NEGATIVE NEGATIVE Final   C Diff interpretation Negative for C. difficile  Final  Stool culture     Status: None (Preliminary result)   Collection Time: 11/28/14  3:31  AM  Result Value Ref Range Status   Specimen Description STOOL  Final   Special Requests Normal  Final   Culture   Final    No Pathogenic E. coli detected HOLDING FOR POSSIBLE PATHOGEN NO CAMPYLOBACTER DETECTED    Report Status PENDING  Incomplete    Coagulation Studies: No results for input(s): LABPROT, INR in the last 72 hours.  Urinalysis:  Recent Labs  11/28/14 0355  COLORURINE YELLOW*  LABSPEC 1.010  PHURINE 5.0  GLUCOSEU NEGATIVE  HGBUR NEGATIVE  BILIRUBINUR NEGATIVE  KETONESUR NEGATIVE  PROTEINUR NEGATIVE  NITRITE NEGATIVE  LEUKOCYTESUR NEGATIVE      Imaging: US Renal  11/29/2014   CLINICAL DATA:  Acute renal failure  EXAM: RENAL / URINARY TRACT ULTRASOUND COMPLETE  COMPARISON:  None.   FINDINGS: Right Kidney:  Length: 12.6 cm. Echogenicity within normal limits. No mass or hydronephrosis visualized.  Left Kidney:  Length: 12.9 cm. Echogenicity within normal limits. No mass or hydronephrosis visualized.  Bladder:  Decompressed by Foley catheter  IMPRESSION: No acute abnormality noted.   Electronically Signed   By: Alcide Clever M.D.   On: 11/29/2014 10:06   Dg Chest Portable 1 View  11/28/2014   CLINICAL DATA:  Acute onset of diarrhea and shortness of breath. Initial encounter.  EXAM: PORTABLE CHEST 1 VIEW  COMPARISON:  Chest radiograph from 07/12/2010  FINDINGS: The lungs are well-aerated. Mild bibasilar opacities may reflect atelectasis or possibly mild pneumonia. There is no evidence of pleural effusion or pneumothorax.  The cardiomediastinal silhouette is within normal limits. No acute osseous abnormalities are seen.  IMPRESSION: Mild bibasilar opacities may reflect atelectasis or possibly mild pneumonia.   Electronically Signed   By: Roanna Raider M.D.   On: 11/28/2014 05:11     Medications:     . aspirin EC  81 mg Oral Daily  . benazepril  10 mg Oral Daily  . collagenase   Topical Daily  . heparin  5,000 Units Subcutaneous 3 times per day  . insulin aspart  0-15 Units Subcutaneous TID WC  . ipratropium  0.5 mg Nebulization Q6H  . sodium chloride  3 mL Intravenous Q12H   ondansetron **OR** ondansetron (ZOFRAN) IV  Assessment/ Plan:  Deanna Rice is a 59 y.o. white female with COPD, diabetes mellitus type II, hypertension, congestive heart failure, morbid obesity, , who was admitted to [facility] on 11/28/2014 for Dehydration [E86.0] Lactic acidosis [E87.2] Diarrhea of infectious origin [A09] Community acquired pneumonia [J18.9] Acute kidney injury (HCC) [N17.9]   1. Acute Renal Failure with metabolic acidosis: suspected on chronic kidney disease. Will need to get labs from PCP. Acidosis seems to be more from acute renal failure than ischemic bowel.  - Acute renal  failure from acute volume loss from diarrhea. Agree with IV fluids. Creatinine improving.  - Continue to monitor volume status, urine output and renal function - Ultrasound reviewed.   2. Diabetes mellitus type II with renal manifestations: urine without protein - Continue glucose control.   3. Hypertension: hypotensive on admission. Back on benazepril.   4. Diarrhea: resolving, supportive care.    LOS: 1 Deanna Rice 10/4/201612:18 PM

## 2014-11-29 NOTE — Progress Notes (Signed)
Physical Therapy Treatment Patient Details Name: Deanna Rice MRN: [id number] DOB: 1955-11-15 Today's Date: 11/29/2014    History of Present Illness Pt was admitted to the hospital after having profuse diarrhea and SOB    PT Comments    Pt is improving this date and is making progress towards goals. Pt with increased ambulation this date using BRW and 4L of O2. Limited there-ex performed secondary to pain complaints on L knee. Pt also ambulated to bathroom, only requiring supervision for hygiene and transfers. Pt is nearing baseline functioning.   Follow Up Recommendations  Home health PT     Equipment Recommendations   (needs BRW)    Recommendations for Other Services       Precautions / Restrictions Precautions Precautions: Fall Precaution Comments: none Restrictions Weight Bearing Restrictions: No    Mobility  Bed Mobility Overal bed mobility: Modified Independent Bed Mobility: Supine to Sit           General bed mobility comments: supine<>sit with safe technique and MI. Once seated at EOB, pt demonstrates good balance.  Transfers Overall transfer level: Needs assistance Equipment used:  (BRW) Transfers: Sit to/from Stand Sit to Stand: Min guard         General transfer comment: sit<>Stand with BRW and cga. Safe technique performed. No LOB once standing.  Ambulation/Gait Ambulation/Gait assistance: Supervision   Assistive device:  (BRW) Gait Pattern/deviations: Step-to pattern Gait velocity: decreased  Gait velocity interpretation: Below normal speed for age/gender General Gait Details: Pt ambulated 2 x 40' and additional 10' using BRW in room. Pt performed all mobility while on 4L of O2 with no SOB symptoms noted. Pt demonstrates step-to gait pattern. Pt with no SOB symptoms during ambulation. 1 seated rest break noted between ambulation bouts   Stairs            Wheelchair Mobility    Modified Rankin (Stroke Patients Only)       Balance                                     Cognition Arousal/Alertness: Awake/alert Behavior During Therapy: WFL for tasks assessed/performed Overall Cognitive Status: Within Functional Limits for tasks assessed                      Exercises Other Exercises Other Exercises: Pt performed seated/standing ther-ex x 10 reps including standing mini squats and attempted hip flexion and heel raises, however complained of increased L side knee pain. Seated ther-ex performed including scap squeezes, and resisted elbow flexion/extension. Pt requires cga for all ther-ex as well as cues for correct technique.    General Comments        Pertinent Vitals/Pain Pain Assessment: Faces Pain Score: 5  Pain Location: L knee/shoulder Pain Descriptors / Indicators: Aching;Cramping Pain Intervention(s): Limited activity within patient's tolerance    Home Living                      Prior Function            PT Goals (current goals can now be found in the care plan section) Acute Rehab PT Goals Patient Stated Goal: to return home  PT Goal Formulation: With patient Time For Goal Achievement: 12/12/14 Potential to Achieve Goals: Good Progress towards PT goals: Progressing toward goals    Frequency  Min 2X/week    PT Plan Current plan  remains appropriate    Co-evaluation             End of Session Equipment Utilized During Treatment: Gait belt;Oxygen Activity Tolerance: Patient tolerated treatment well Patient left: in bed;with bed alarm set     Time: 1308-6578 PT Time Calculation (min) (ACUTE ONLY): 26 min  Charges:  $Gait Training: 8-22 mins $Therapeutic Exercise: 8-22 mins                    G Codes:      Anastacio Bua 12-10-14, 3:01 PM  Elizabeth Palau, PT, DPT [phone]

## 2014-11-29 NOTE — Clinical Documentation Improvement (Signed)
Internal Medicine  Based on the clinical indicators below, please provide a diagnosis including the acuity associated with the treatment being provided. Document findings in next progress note and include in discharge summary if applicable.   Other  Clinically Undetermined  Supporting Information:  O2 dependent 4 Liters with history of COPD  While in-house, patient mildly tachycardic > 90 at times and mildly tachypneic with respiratory rates ranging from 14 to 25  Please exercise your independent, professional judgment when responding. A specific answer is not anticipated or expected.  Thank You, Shellee Milo BSN, RN Health Information Management [place] [phone]

## 2014-11-29 NOTE — Care Management (Signed)
Pt evaluated patient and recommended Home with Home Health PT. Patient is Medicaid and PT not covered. Patient will need wound care RN at time of discharge. Will discuss with patient case worker at DSS. Patient receives services from [facility].  Continue to follow

## 2014-12-01 LAB — STOOL CULTURE
Culture: NOT DETECTED
SPECIAL REQUESTS: NORMAL

## 2014-12-03 LAB — CULTURE, BLOOD (ROUTINE X 2)
CULTURE: NO GROWTH
CULTURE: NO GROWTH

## 2014-12-22 ENCOUNTER — Inpatient Hospital Stay: Payer: Medicaid Other | Attending: Oncology | Admitting: Oncology

## 2014-12-22 ENCOUNTER — Inpatient Hospital Stay: Payer: Medicaid Other

## 2014-12-22 ENCOUNTER — Encounter: Payer: Self-pay | Admitting: Oncology

## 2014-12-22 VITALS — BP 149/72 | HR 69 | Temp 97.4°F | Resp 20

## 2014-12-22 DIAGNOSIS — I872 Venous insufficiency (chronic) (peripheral): Secondary | ICD-10-CM | POA: Insufficient documentation

## 2014-12-22 DIAGNOSIS — D649 Anemia, unspecified: Secondary | ICD-10-CM

## 2014-12-22 DIAGNOSIS — N189 Chronic kidney disease, unspecified: Secondary | ICD-10-CM | POA: Diagnosis not present

## 2014-12-22 DIAGNOSIS — I129 Hypertensive chronic kidney disease with stage 1 through stage 4 chronic kidney disease, or unspecified chronic kidney disease: Secondary | ICD-10-CM

## 2014-12-22 DIAGNOSIS — E119 Type 2 diabetes mellitus without complications: Secondary | ICD-10-CM

## 2014-12-22 DIAGNOSIS — E039 Hypothyroidism, unspecified: Secondary | ICD-10-CM | POA: Diagnosis not present

## 2014-12-22 DIAGNOSIS — Z79899 Other long term (current) drug therapy: Secondary | ICD-10-CM | POA: Diagnosis not present

## 2014-12-22 DIAGNOSIS — M109 Gout, unspecified: Secondary | ICD-10-CM | POA: Diagnosis not present

## 2014-12-22 DIAGNOSIS — G4733 Obstructive sleep apnea (adult) (pediatric): Secondary | ICD-10-CM | POA: Insufficient documentation

## 2014-12-22 DIAGNOSIS — I259 Chronic ischemic heart disease, unspecified: Secondary | ICD-10-CM | POA: Insufficient documentation

## 2014-12-22 DIAGNOSIS — E782 Mixed hyperlipidemia: Secondary | ICD-10-CM | POA: Insufficient documentation

## 2014-12-22 DIAGNOSIS — Z7982 Long term (current) use of aspirin: Secondary | ICD-10-CM | POA: Diagnosis not present

## 2014-12-22 DIAGNOSIS — Z794 Long term (current) use of insulin: Secondary | ICD-10-CM | POA: Insufficient documentation

## 2014-12-22 DIAGNOSIS — Z9981 Dependence on supplemental oxygen: Secondary | ICD-10-CM | POA: Insufficient documentation

## 2014-12-22 DIAGNOSIS — J449 Chronic obstructive pulmonary disease, unspecified: Secondary | ICD-10-CM

## 2014-12-22 DIAGNOSIS — D696 Thrombocytopenia, unspecified: Secondary | ICD-10-CM | POA: Diagnosis not present

## 2014-12-22 LAB — DAT, POLYSPECIFIC AHG (ARMC ONLY): Polyspecific AHG test: NEGATIVE

## 2014-12-22 LAB — CBC
HEMATOCRIT: 26.6 % — AB (ref 35.0–47.0)
HEMOGLOBIN: 8.7 g/dL — AB (ref 12.0–16.0)
MCH: 29.5 pg (ref 26.0–34.0)
MCHC: 32.5 g/dL (ref 32.0–36.0)
MCV: 90.8 fL (ref 80.0–100.0)
Platelets: 63 10*3/uL — ABNORMAL LOW (ref 150–440)
RBC: 2.93 MIL/uL — AB (ref 3.80–5.20)
RDW: 16.4 % — ABNORMAL HIGH (ref 11.5–14.5)
WBC: 6.2 10*3/uL (ref 3.6–11.0)

## 2014-12-22 LAB — IRON AND TIBC
Iron: 36 ug/dL (ref 28–170)
SATURATION RATIOS: 13 % (ref 10.4–31.8)
TIBC: 276 ug/dL (ref 250–450)
UIBC: 240 ug/dL

## 2014-12-22 LAB — LACTATE DEHYDROGENASE: LDH: 103 U/L (ref 98–192)

## 2014-12-22 LAB — VITAMIN B12: VITAMIN B 12: 194 pg/mL (ref 180–914)

## 2014-12-22 LAB — FERRITIN: Ferritin: 100 ng/mL (ref 11–307)

## 2014-12-22 LAB — FOLATE: FOLATE: 34 ng/mL (ref >5.9)

## 2014-12-23 LAB — HAPTOGLOBIN: Haptoglobin: 184 mg/dL (ref 34–200)

## 2014-12-23 LAB — ANA W/REFLEX: Anti Nuclear Antibody(ANA): NEGATIVE

## 2014-12-23 LAB — ERYTHROPOIETIN: Erythropoietin: 50.9 m[IU]/mL — ABNORMAL HIGH (ref 2.6–18.5)

## 2015-01-01 NOTE — Progress Notes (Signed)
[place]  Telephone:([phone] Fax:(336) [phone]  ID: NIAJA STICKLEY OB: December 14, 1955  MR#: [id number]  KPQ#:[id number]  Patient Care Team: Lavera Guise, MD as PCP - General (Internal Medicine)  CHIEF COMPLAINT:  Chief Complaint  Patient presents with  . New Evaluation    anemia    INTERVAL HISTORY: Patient is a 59 year old female was found to have a declining hemoglobin and platelet count on routine blood work. She was recently admitted to the hospital with diarrhea and dehydration, but this has resolved and she now feels back to her baseline. She has no neurologic complaints. She denies any fevers. She denies any chest pain or shortness of breath. She denies any nausea or vomiting. She has no melena or hematochezia. She has no urinary complaints. Patient feels back to her baseline and offers no specific complaints today.  REVIEW OF SYSTEMS:   Review of Systems  Constitutional: Negative.  Negative for fever and malaise/fatigue.  Cardiovascular: Negative.   Gastrointestinal: Negative.   Musculoskeletal: Negative.   Neurological: Negative.  Negative for weakness.    As per HPI. Otherwise, a complete review of systems is negatve.  PAST MEDICAL HISTORY: Past Medical History  Diagnosis Date  . COPD (chronic obstructive pulmonary disease) ([place])   . Diabetes mellitus without complication ([place])   . Hypertension   . CHF (congestive heart failure) ([place])   . Severe recurrent major depression with psychotic features ([place])   . Obstructive sleep apnea syndrome   . Primary generalized hypertrophic osteoarthrosis   . Dependence on supplemental oxygen   . Peripheral venous insufficiency   . Chronic ischemic heart disease, unspecified   . Anemia, unspecified   . Gout, unspecified   . Primary hypothyroidism   . Mixed hyperlipidemia   . Obstructive chronic bronchitis without exacerbation ([place])     PAST SURGICAL HISTORY: Past Surgical History  Procedure Laterality  Date  . Tonsillectomy      FAMILY HISTORY Family History  Problem Relation Age of Onset  . Hypertension Mother   . Diabetes Mother   . CAD Mother   . CAD Father   . Diabetes Father   . Hypertension Father   . Hypertension Sister   . Diabetes Sister   . CAD Sister        ADVANCED DIRECTIVES:    HEALTH MAINTENANCE: Social History  Substance Use Topics  . Smoking status: Former Research scientist (life sciences)  . Smokeless tobacco: Not on file  . Alcohol Use: No     Colonoscopy:  PAP:  Bone density:  Lipid panel:  No Known Allergies  Current Outpatient Prescriptions  Medication Sig Dispense Refill  . allopurinol (ZYLOPRIM) 100 MG tablet Take 100 mg by mouth daily.    Marland Kitchen amitriptyline (ELAVIL) 50 MG tablet Take 50 mg by mouth at bedtime.    Marland Kitchen aspirin EC 325 MG tablet Take 325 mg by mouth daily.    . benazepril (LOTENSIN) 10 MG tablet Take 10 mg by mouth daily.    . calcium-vitamin D (OSCAL WITH D) 250-125 MG-UNIT tablet Take 2 tablets by mouth daily.    . clopidogrel (PLAVIX) 75 MG tablet Take 75 mg by mouth daily.    . diazepam (VALIUM) 5 MG tablet Take 5 mg by mouth 2 (two) times daily as needed for anxiety.    . docusate sodium (COLACE) 100 MG capsule Take 50 mg by mouth 2 (two) times daily.    . DULoxetine (CYMBALTA) 60 MG capsule Take 60 mg by mouth 2 (  two) times daily.    . ferrous sulfate 325 (65 FE) MG tablet Take 325 mg by mouth daily with breakfast.    . Fluticasone-Salmeterol (ADVAIR) 250-50 MCG/DOSE AEPB Inhale 1 puff into the lungs 2 (two) times daily.    . folic acid (FOLVITE) 034 MCG tablet Take 400 mcg by mouth daily.    Marland Kitchen HYDROcodone-acetaminophen (NORCO/VICODIN) 5-325 MG tablet Take 1 tablet by mouth 2 (two) times daily as needed for moderate pain.    . indomethacin (INDOCIN) 50 MG capsule Take 50 mg by mouth 2 (two) times daily as needed.    . Insulin Aspart (NOVOLOG FLEXPEN [place]) Inject into the skin. As directed on sliding scale    . insulin NPH-regular Human (NOVOLIN 70/30)  (70-30) 100 UNIT/ML injection Inject 45 Units into the skin 2 (two) times daily with a meal.    . Ipratropium-Albuterol (COMBIVENT RESPIMAT) 20-100 MCG/ACT AERS respimat Inhale 1 puff into the lungs every 6 (six) hours.    . isosorbide mononitrate (IMDUR) 60 MG 24 hr tablet Take 60 mg by mouth 2 (two) times daily.    . Liraglutide (VICTOZA [place]) Inject 1.8 mg into the skin every morning.    . loratadine (CLARITIN) 10 MG tablet Take 10 mg by mouth daily.    . metoprolol succinate (TOPROL-XL) 50 MG 24 hr tablet Take 50 mg by mouth daily. Take with or immediately following a meal.    . nitroGLYCERIN (NITROLINGUAL) 0.4 MG/SPRAY spray Place 1 spray under the tongue every 5 (five) minutes x 3 doses as needed for chest pain.    Marland Kitchen nystatin (MYCOSTATIN/NYSTOP) 100000 UNIT/GM POWD Apply topically 6 (six) times daily.    Marland Kitchen omeprazole (PRILOSEC) 20 MG capsule Take 20 mg by mouth 2 (two) times daily before a meal.    . potassium chloride (K-DUR,KLOR-CON) 10 MEQ tablet Take 10 mEq by mouth 2 (two) times a week.    . simvastatin (ZOCOR) 80 MG tablet Take 80 mg by mouth daily at 6 PM.    . traMADol (ULTRAM) 50 MG tablet Take 50 mg by mouth 3 (three) times daily as needed.     No current facility-administered medications for this visit.    OBJECTIVE: Filed Vitals:   12/22/14 0950  BP: 149/72  Pulse: 69  Temp: 97.4 F (36.3 C)  Resp: 20     There is no weight on file to calculate BMI.    ECOG FS:0 - Asymptomatic  General: Well-developed, well-nourished, no acute distress. Eyes: Pink conjunctiva, anicteric sclera. HEENT: Normocephalic, moist mucous membranes, clear oropharnyx. Lungs: Clear to auscultation bilaterally. Heart: Regular rate and rhythm. No rubs, murmurs, or gallops. Abdomen: Soft, nontender, nondistended. No organomegaly noted, normoactive bowel sounds. Musculoskeletal: No edema, cyanosis, or clubbing. Neuro: Alert, answering all questions appropriately. Cranial nerves grossly intact. Skin:  No rashes or petechiae noted. Psych: Normal affect. Lymphatics: No cervical, calvicular, axillary or inguinal LAD.   LAB RESULTS:  Lab Results  Component Value Date   NA 142 11/29/2014   K 4.3 11/29/2014   CL 110 11/29/2014   CO2 24 11/29/2014   GLUCOSE 94 11/29/2014   BUN 60* 11/29/2014   CREATININE 1.75* 11/29/2014   CALCIUM 8.6* 11/29/2014   PROT 7.5 11/28/2014   ALBUMIN 4.0 11/28/2014   AST 17 11/28/2014   ALT 14 11/28/2014   ALKPHOS 135* 11/28/2014   BILITOT 0.4 11/28/2014   GFRNONAA 30* 11/29/2014   GFRAA 35* 11/29/2014    Lab Results  Component Value Date   WBC  6.2 12/22/2014   NEUTROABS 6.3 09/22/2009   HGB 8.7* 12/22/2014   HCT 26.6* 12/22/2014   MCV 90.8 12/22/2014   PLT 63* 12/22/2014     STUDIES: No results found.  ASSESSMENT: Anemia and thrombocytopenia:  PLAN:    1. Anemia: Patient's hemoglobin is decreased, but essentially stable. All of her labwork including iron stores, B 12, folate, hemolysis labs are within normal limits. Her erythropoietin level is appropriately elevated. We will do additional lab work at next visit and consider bone marrow biopsy in the future to assess for underlying MDS. Patient will return to clinic in approximately one month for further evaluation. 2. Thrombocytopenia: Patient's platelet count is declining. All of her labwork is above including platelet antibodies are negative. Will consider bone marrow biopsy in the future if no improvement. 3. Chronic renal insufficiency: Mild, although patient has an elevated erythropoietin level she may benefit from Procrit in the future.  Patient expressed understanding and was in agreement with this plan. She also understands that She can call clinic at any time with any questions, concerns, or complaints.   Lloyd Huger, MD   01/01/2015 7:38 AM

## 2015-02-21 ENCOUNTER — Inpatient Hospital Stay: Payer: Medicaid Other | Admitting: Oncology

## 2015-02-21 ENCOUNTER — Inpatient Hospital Stay: Payer: Medicaid Other

## 2015-03-02 ENCOUNTER — Inpatient Hospital Stay: Payer: Medicaid Other

## 2015-03-02 ENCOUNTER — Inpatient Hospital Stay: Payer: Medicaid Other | Attending: Oncology | Admitting: Oncology

## 2015-03-02 VITALS — BP 137/72 | HR 76 | Temp 98.1°F | Resp 18

## 2015-03-02 DIAGNOSIS — D649 Anemia, unspecified: Secondary | ICD-10-CM | POA: Diagnosis not present

## 2015-03-02 DIAGNOSIS — E119 Type 2 diabetes mellitus without complications: Secondary | ICD-10-CM | POA: Diagnosis not present

## 2015-03-02 DIAGNOSIS — M109 Gout, unspecified: Secondary | ICD-10-CM | POA: Diagnosis not present

## 2015-03-02 DIAGNOSIS — D61818 Other pancytopenia: Secondary | ICD-10-CM

## 2015-03-02 DIAGNOSIS — Z79899 Other long term (current) drug therapy: Secondary | ICD-10-CM | POA: Diagnosis not present

## 2015-03-02 DIAGNOSIS — I872 Venous insufficiency (chronic) (peripheral): Secondary | ICD-10-CM | POA: Diagnosis not present

## 2015-03-02 DIAGNOSIS — Z9981 Dependence on supplemental oxygen: Secondary | ICD-10-CM | POA: Diagnosis not present

## 2015-03-02 DIAGNOSIS — E039 Hypothyroidism, unspecified: Secondary | ICD-10-CM | POA: Insufficient documentation

## 2015-03-02 DIAGNOSIS — E669 Obesity, unspecified: Secondary | ICD-10-CM | POA: Insufficient documentation

## 2015-03-02 DIAGNOSIS — D696 Thrombocytopenia, unspecified: Secondary | ICD-10-CM | POA: Diagnosis not present

## 2015-03-02 DIAGNOSIS — Z87891 Personal history of nicotine dependence: Secondary | ICD-10-CM | POA: Diagnosis not present

## 2015-03-02 DIAGNOSIS — J449 Chronic obstructive pulmonary disease, unspecified: Secondary | ICD-10-CM | POA: Diagnosis not present

## 2015-03-02 DIAGNOSIS — I259 Chronic ischemic heart disease, unspecified: Secondary | ICD-10-CM | POA: Insufficient documentation

## 2015-03-02 DIAGNOSIS — Z794 Long term (current) use of insulin: Secondary | ICD-10-CM | POA: Insufficient documentation

## 2015-03-02 DIAGNOSIS — N189 Chronic kidney disease, unspecified: Secondary | ICD-10-CM | POA: Insufficient documentation

## 2015-03-02 DIAGNOSIS — E782 Mixed hyperlipidemia: Secondary | ICD-10-CM | POA: Insufficient documentation

## 2015-03-02 DIAGNOSIS — I509 Heart failure, unspecified: Secondary | ICD-10-CM | POA: Diagnosis not present

## 2015-03-02 DIAGNOSIS — I129 Hypertensive chronic kidney disease with stage 1 through stage 4 chronic kidney disease, or unspecified chronic kidney disease: Secondary | ICD-10-CM | POA: Diagnosis not present

## 2015-03-02 DIAGNOSIS — G4733 Obstructive sleep apnea (adult) (pediatric): Secondary | ICD-10-CM | POA: Insufficient documentation

## 2015-03-02 DIAGNOSIS — Z7982 Long term (current) use of aspirin: Secondary | ICD-10-CM | POA: Insufficient documentation

## 2015-03-02 LAB — CBC WITH DIFFERENTIAL/PLATELET
BASOS PCT: 1 %
Basophils Absolute: 0 10*3/uL (ref 0–0.1)
Eosinophils Absolute: 0.1 10*3/uL (ref 0–0.7)
Eosinophils Relative: 1 %
HEMATOCRIT: 28.7 % — AB (ref 35.0–47.0)
HEMOGLOBIN: 9.3 g/dL — AB (ref 12.0–16.0)
LYMPHS ABS: 1.2 10*3/uL (ref 1.0–3.6)
Lymphocytes Relative: 16 %
MCH: 27.3 pg (ref 26.0–34.0)
MCHC: 32.4 g/dL (ref 32.0–36.0)
MCV: 84.2 fL (ref 80.0–100.0)
MONOS PCT: 4 %
Monocytes Absolute: 0.3 10*3/uL (ref 0.2–0.9)
NEUTROS ABS: 5.9 10*3/uL (ref 1.4–6.5)
NEUTROS PCT: 78 %
Platelets: 95 10*3/uL — ABNORMAL LOW (ref 150–440)
RBC: 3.41 MIL/uL — AB (ref 3.80–5.20)
RDW: 14.8 % — ABNORMAL HIGH (ref 11.5–14.5)
WBC: 7.5 10*3/uL (ref 3.6–11.0)

## 2015-03-03 LAB — PROTEIN ELECTROPHORESIS, SERUM
A/G Ratio: 1 (ref 0.7–1.7)
Albumin ELP: 3.2 g/dL (ref 2.9–4.4)
Alpha-1-Globulin: 0.2 g/dL (ref 0.0–0.4)
Alpha-2-Globulin: 0.8 g/dL (ref 0.4–1.0)
Beta Globulin: 1 g/dL (ref 0.7–1.3)
GAMMA GLOBULIN: 1.1 g/dL (ref 0.4–1.8)
GLOBULIN, TOTAL: 3.1 g/dL (ref 2.2–3.9)
TOTAL PROTEIN ELP: 6.3 g/dL (ref 6.0–8.5)

## 2015-03-03 NOTE — Progress Notes (Signed)
[place]  Telephone:(336) [phone] Fax:(336) [phone]  ID: LADA FULBRIGHT OB: 1955/08/21  MR#: [id number]  VFI#:[id number]  Patient Care Team: Lavera Guise, MD as PCP - General (Internal Medicine)  CHIEF COMPLAINT:  Chief Complaint  Patient presents with  . Anemia    INTERVAL HISTORY: Patient returns to clinic today for repeat laboratory work and further evaluation. She currently feels well and is at her baseline. She has no neurologic complaints. She denies any fevers. She denies any chest pain or shortness of breath. She denies any nausea or vomiting. She has no melena or hematochezia. She has no urinary complaints. Patient offers no specific complaints today.  REVIEW OF SYSTEMS:   Review of Systems  Constitutional: Negative.  Negative for fever and malaise/fatigue.  Respiratory: Negative.   Cardiovascular: Negative.   Gastrointestinal: Negative.  Negative for blood in stool and melena.  Musculoskeletal: Negative.   Neurological: Negative.  Negative for weakness.    As per HPI. Otherwise, a complete review of systems is negatve.  PAST MEDICAL HISTORY: Past Medical History  Diagnosis Date  . COPD (chronic obstructive pulmonary disease) ([place])   . Diabetes mellitus without complication ([place])   . Hypertension   . CHF (congestive heart failure) ([place])   . Severe recurrent major depression with psychotic features ([place])   . Obstructive sleep apnea syndrome   . Primary generalized hypertrophic osteoarthrosis   . Dependence on supplemental oxygen   . Peripheral venous insufficiency   . Chronic ischemic heart disease, unspecified   . Anemia, unspecified   . Gout, unspecified   . Primary hypothyroidism   . Mixed hyperlipidemia   . Obstructive chronic bronchitis without exacerbation ([place])     PAST SURGICAL HISTORY: Past Surgical History  Procedure Laterality Date  . Tonsillectomy      FAMILY HISTORY Family History  Problem Relation Age of Onset  .  Hypertension Mother   . Diabetes Mother   . CAD Mother   . CAD Father   . Diabetes Father   . Hypertension Father   . Hypertension Sister   . Diabetes Sister   . CAD Sister        ADVANCED DIRECTIVES:    HEALTH MAINTENANCE: Social History  Substance Use Topics  . Smoking status: Former Research scientist (life sciences)  . Smokeless tobacco: Not on file  . Alcohol Use: No     Colonoscopy:  PAP:  Bone density:  Lipid panel:  No Known Allergies  Current Outpatient Prescriptions  Medication Sig Dispense Refill  . allopurinol (ZYLOPRIM) 100 MG tablet Take 100 mg by mouth daily.    Marland Kitchen amitriptyline (ELAVIL) 50 MG tablet Take 50 mg by mouth at bedtime.    Marland Kitchen aspirin EC 325 MG tablet Take 325 mg by mouth daily.    . benazepril (LOTENSIN) 10 MG tablet Take 10 mg by mouth daily.    . calcium-vitamin D (OSCAL WITH D) 250-125 MG-UNIT tablet Take 2 tablets by mouth daily.    . clopidogrel (PLAVIX) 75 MG tablet Take 75 mg by mouth daily.    . diazepam (VALIUM) 5 MG tablet Take 5 mg by mouth 2 (two) times daily as needed for anxiety.    . docusate sodium (COLACE) 100 MG capsule Take 50 mg by mouth 2 (two) times daily.    . DULoxetine (CYMBALTA) 60 MG capsule Take 60 mg by mouth 2 (two) times daily.    . ferrous sulfate 325 (65 FE) MG tablet Take 325 mg by mouth daily with  breakfast.    . Fluticasone-Salmeterol (ADVAIR) 250-50 MCG/DOSE AEPB Inhale 1 puff into the lungs 2 (two) times daily.    . folic acid (FOLVITE) 628 MCG tablet Take 400 mcg by mouth daily.    Marland Kitchen HYDROcodone-acetaminophen (NORCO/VICODIN) 5-325 MG tablet Take 1 tablet by mouth 2 (two) times daily as needed for moderate pain.    . indomethacin (INDOCIN) 50 MG capsule Take 50 mg by mouth 2 (two) times daily as needed.    . Insulin Aspart (NOVOLOG FLEXPEN [place]) Inject into the skin. As directed on sliding scale    . insulin NPH-regular Human (NOVOLIN 70/30) (70-30) 100 UNIT/ML injection Inject 45 Units into the skin 2 (two) times daily with a meal.      . Ipratropium-Albuterol (COMBIVENT RESPIMAT) 20-100 MCG/ACT AERS respimat Inhale 1 puff into the lungs every 6 (six) hours.    . isosorbide mononitrate (IMDUR) 60 MG 24 hr tablet Take 60 mg by mouth 2 (two) times daily.    . Liraglutide (VICTOZA [place]) Inject 1.8 mg into the skin every morning.    . loratadine (CLARITIN) 10 MG tablet Take 10 mg by mouth daily.    . metoprolol succinate (TOPROL-XL) 50 MG 24 hr tablet Take 50 mg by mouth daily. Take with or immediately following a meal.    . nitroGLYCERIN (NITROLINGUAL) 0.4 MG/SPRAY spray Place 1 spray under the tongue every 5 (five) minutes x 3 doses as needed for chest pain.    Marland Kitchen nystatin (MYCOSTATIN/NYSTOP) 100000 UNIT/GM POWD Apply topically 6 (six) times daily.    Marland Kitchen omeprazole (PRILOSEC) 20 MG capsule Take 20 mg by mouth 2 (two) times daily before a meal.    . potassium chloride (K-DUR,KLOR-CON) 10 MEQ tablet Take 10 mEq by mouth 2 (two) times a week.    . simvastatin (ZOCOR) 80 MG tablet Take 80 mg by mouth daily at 6 PM.    . traMADol (ULTRAM) 50 MG tablet Take 50 mg by mouth 3 (three) times daily as needed.     No current facility-administered medications for this visit.    OBJECTIVE: Filed Vitals:   03/02/15 1351  BP: 137/72  Pulse: 76  Temp: 98.1 F (36.7 C)  Resp: 18     There is no weight on file to calculate BMI.    ECOG FS:0 - Asymptomatic  General:  Obese, sitting in a wheelchair. no acute distress. Eyes: Pink conjunctiva, anicteric sclera. Lungs: Clear to auscultation bilaterally. Heart: Regular rate and rhythm. No rubs, murmurs, or gallops. Abdomen: Soft, nontender, nondistended. No organomegaly noted, normoactive bowel sounds. Musculoskeletal: No edema, cyanosis, or clubbing. Neuro: Alert, answering all questions appropriately. Cranial nerves grossly intact. Skin: No rashes or petechiae noted. Psych: Normal affect.   LAB RESULTS:  Lab Results  Component Value Date   NA 142 11/29/2014   K 4.3 11/29/2014   CL  110 11/29/2014   CO2 24 11/29/2014   GLUCOSE 94 11/29/2014   BUN 60* 11/29/2014   CREATININE 1.75* 11/29/2014   CALCIUM 8.6* 11/29/2014   PROT 7.5 11/28/2014   ALBUMIN 4.0 11/28/2014   AST 17 11/28/2014   ALT 14 11/28/2014   ALKPHOS 135* 11/28/2014   BILITOT 0.4 11/28/2014   GFRNONAA 30* 11/29/2014   GFRAA 35* 11/29/2014    Lab Results  Component Value Date   WBC 7.5 03/02/2015   NEUTROABS 5.9 03/02/2015   HGB 9.3* 03/02/2015   HCT 28.7* 03/02/2015   MCV 84.2 03/02/2015   PLT 95* 03/02/2015  STUDIES: No results found.  ASSESSMENT: Anemia and thrombocytopenia:  PLAN:    1. Anemia: Patient's hemoglobin is decreased, but slowly improving from 8.2 to 9.3. Previously, all of her labwork including iron stores, B12, folate, hemolysis labs, and ANA were within normal limits. Her erythropoietin level is appropriately elevated.  No intervention is needed at this time. Return to clinic in 3 months with repeat laboratory work and further evaluation. If there is not continued improvement of her blood counts, can consider a bone marrow biopsy in the future. 2. Thrombocytopenia: Patient's platelet count is also improving. All of her labwork as above including platelet antibodies are negative. 3. Chronic renal insufficiency: Mild, although patient has an elevated erythropoietin level she may benefit from Procrit in the future.  Patient expressed understanding and was in agreement with this plan. She also understands that She can call clinic at any time with any questions, concerns, or complaints.   Lloyd Huger, MD   03/03/2015 1:19 PM

## 2015-03-09 LAB — ANA W/REFLEX: ANA: NEGATIVE

## 2015-03-09 LAB — COMP PANEL: LEUKEMIA/LYMPHOMA

## 2015-03-09 LAB — IGG, IGA, IGM
IGM, SERUM: 186 mg/dL (ref 26–217)
IgA: 268 mg/dL (ref 87–352)
IgG (Immunoglobin G), Serum: 1030 mg/dL (ref 700–1600)

## 2015-05-12 ENCOUNTER — Encounter: Payer: Self-pay | Admitting: Emergency Medicine

## 2015-05-12 ENCOUNTER — Emergency Department
Admission: EM | Admit: 2015-05-12 | Discharge: 2015-05-12 | Disposition: A | Discharge status: Home / Self Care | Payer: Medicaid Other | Attending: Emergency Medicine | Admitting: Emergency Medicine

## 2015-05-12 DIAGNOSIS — Z79899 Other long term (current) drug therapy: Secondary | ICD-10-CM | POA: Diagnosis not present

## 2015-05-12 DIAGNOSIS — L0390 Cellulitis, unspecified: Secondary | ICD-10-CM | POA: Diagnosis not present

## 2015-05-12 DIAGNOSIS — E039 Hypothyroidism, unspecified: Secondary | ICD-10-CM | POA: Diagnosis not present

## 2015-05-12 DIAGNOSIS — E782 Mixed hyperlipidemia: Secondary | ICD-10-CM | POA: Insufficient documentation

## 2015-05-12 DIAGNOSIS — Z9981 Dependence on supplemental oxygen: Secondary | ICD-10-CM | POA: Diagnosis not present

## 2015-05-12 DIAGNOSIS — D649 Anemia, unspecified: Secondary | ICD-10-CM | POA: Insufficient documentation

## 2015-05-12 DIAGNOSIS — I110 Hypertensive heart disease with heart failure: Secondary | ICD-10-CM | POA: Diagnosis not present

## 2015-05-12 DIAGNOSIS — Z87891 Personal history of nicotine dependence: Secondary | ICD-10-CM | POA: Diagnosis not present

## 2015-05-12 DIAGNOSIS — M150 Primary generalized (osteo)arthritis: Secondary | ICD-10-CM | POA: Insufficient documentation

## 2015-05-12 DIAGNOSIS — J449 Chronic obstructive pulmonary disease, unspecified: Secondary | ICD-10-CM | POA: Insufficient documentation

## 2015-05-12 DIAGNOSIS — F333 Major depressive disorder, recurrent, severe with psychotic symptoms: Secondary | ICD-10-CM | POA: Insufficient documentation

## 2015-05-12 DIAGNOSIS — I5040 Unspecified combined systolic (congestive) and diastolic (congestive) heart failure: Secondary | ICD-10-CM | POA: Diagnosis not present

## 2015-05-12 DIAGNOSIS — E119 Type 2 diabetes mellitus without complications: Secondary | ICD-10-CM | POA: Insufficient documentation

## 2015-05-12 DIAGNOSIS — R1030 Lower abdominal pain, unspecified: Secondary | ICD-10-CM | POA: Diagnosis present

## 2015-05-12 DIAGNOSIS — G4733 Obstructive sleep apnea (adult) (pediatric): Secondary | ICD-10-CM | POA: Diagnosis not present

## 2015-05-12 DIAGNOSIS — Z7982 Long term (current) use of aspirin: Secondary | ICD-10-CM | POA: Diagnosis not present

## 2015-05-12 DIAGNOSIS — I259 Chronic ischemic heart disease, unspecified: Secondary | ICD-10-CM | POA: Diagnosis not present

## 2015-05-12 DIAGNOSIS — N179 Acute kidney failure, unspecified: Secondary | ICD-10-CM | POA: Diagnosis not present

## 2015-05-12 DIAGNOSIS — Z794 Long term (current) use of insulin: Secondary | ICD-10-CM | POA: Diagnosis not present

## 2015-05-12 LAB — CBC WITH DIFFERENTIAL/PLATELET
Basophils Absolute: 0 10*3/uL (ref 0–0.1)
Basophils Relative: 1 %
Eosinophils Absolute: 0.1 10*3/uL (ref 0–0.7)
Eosinophils Relative: 1 %
HCT: 23.9 % — ABNORMAL LOW (ref 35.0–47.0)
Hemoglobin: 7.8 g/dL — ABNORMAL LOW (ref 12.0–16.0)
Lymphocytes Relative: 10 %
Lymphs Abs: 0.5 10*3/uL — ABNORMAL LOW (ref 1.0–3.6)
MCH: 27.3 pg (ref 26.0–34.0)
MCHC: 32.9 g/dL (ref 32.0–36.0)
MCV: 83 fL (ref 80.0–100.0)
Monocytes Absolute: 0.3 10*3/uL (ref 0.2–0.9)
Monocytes Relative: 6 %
Neutro Abs: 4 10*3/uL (ref 1.4–6.5)
Neutrophils Relative %: 82 %
Platelets: 136 10*3/uL — ABNORMAL LOW (ref 150–440)
RBC: 2.87 MIL/uL — ABNORMAL LOW (ref 3.80–5.20)
RDW: 18.6 % — ABNORMAL HIGH (ref 11.5–14.5)
WBC: 4.9 10*3/uL (ref 3.6–11.0)

## 2015-05-12 LAB — BASIC METABOLIC PANEL
ANION GAP: 5 (ref 5–15)
BUN: 18 mg/dL (ref 6–20)
CALCIUM: 8.1 mg/dL — AB (ref 8.9–10.3)
CHLORIDE: 102 mmol/L (ref 101–111)
CO2: 28 mmol/L (ref 22–32)
Creatinine, Ser: 1.02 mg/dL — ABNORMAL HIGH (ref 0.44–1.00)
GFR calc Af Amer: >60 mL/min (ref >60)
GFR calc non Af Amer: 59 mL/min — ABNORMAL LOW (ref >60)
GLUCOSE: 186 mg/dL — AB (ref 65–99)
POTASSIUM: 4.9 mmol/L (ref 3.5–5.1)
Sodium: 135 mmol/L (ref 135–145)

## 2015-05-12 MED ORDER — CLINDAMYCIN HCL 300 MG PO CAPS: 300.0000 mg | ORAL_CAPSULE | Freq: Four times a day (QID) | ORAL | Status: DC

## 2015-05-12 MED ORDER — CEPHALEXIN 500 MG PO CAPS: 500.0000 mg | ORAL_CAPSULE | Freq: Four times a day (QID) | ORAL | Status: DC

## 2015-05-12 MED ORDER — SODIUM CHLORIDE 0.9 % IV BOLUS (SEPSIS)
1000.0000 mL | Freq: Once | INTRAVENOUS | Status: AC
  Administered 2015-05-12: 1000 mL via INTRAVENOUS

## 2015-05-12 MED ORDER — CLINDAMYCIN PHOSPHATE 600 MG/50ML IV SOLN
600.0000 mg | Freq: Once | INTRAVENOUS | Status: AC
  Administered 2015-05-12: 600 mg via INTRAVENOUS
  Filled 2015-05-12: qty 50

## 2015-05-12 NOTE — ED Notes (Signed)
MD at bedside. 

## 2015-05-12 NOTE — ED Notes (Signed)
Brought in via ems from home with possible abscess area to "private area"  Felt pop  lastt pm  And noticed some bleeding on sheet

## 2015-05-12 NOTE — Discharge Instructions (Signed)
Please seek medical attention for any high fevers, chest pain, shortness of breath, change in behavior, persistent vomiting, bloody stool or any other new or concerning symptoms. ° ° °Cellulitis °Cellulitis is an infection of the skin and the tissue under the skin. The infected area is usually red and tender. This happens most often in the arms and lower legs. °HOME CARE  °· Take your antibiotic medicine as told. Finish the medicine even if you start to feel better. °· Keep the infected arm or leg raised (elevated). °· Put a warm cloth on the area up to 4 times per day. °· Only take medicines as told by your doctor. °· Keep all doctor visits as told. °GET HELP IF: °· You see red streaks on the skin coming from the infected area. °· Your red area gets bigger or turns a dark color. °· Your bone or joint under the infected area is painful after the skin heals. °· Your infection comes back in the same area or different area. °· You have a puffy (swollen) bump in the infected area. °· You have new symptoms. °· You have a fever. °GET HELP RIGHT AWAY IF:  °· You feel very sleepy. °· You throw up (vomit) or have watery poop (diarrhea). °· You feel sick and have muscle aches and pains. °  °This information is not intended to replace advice given to you by your health care provider. Make sure you discuss any questions you have with your health care provider. °  °Document Released: 07/31/2007 Document Revised: 11/02/2014 Document Reviewed: 04/29/2011 °Elsevier Interactive Patient Education ©2016 [employer] ° °

## 2015-05-12 NOTE — ED Provider Notes (Signed)
[facility] Emergency Department Provider Note    ____________________________________________  Time seen: ~1135  I have reviewed the triage vital signs and the nursing notes.   HISTORY  Chief Complaint Abdominal Pain   History limited by: Not Limited   HPI Deanna Rice is a 60 y.o. female who presents to the emergency department today because of concern for lower abdominal/suprapubic pain and drainage. The patient states that she started having pain in that area three days ago. It has been constant. It has been getting progressively worse. It is now severe. The patient states that she did notice some discharge and bleeding last night, but none today. She states that she has not had any fever, nausea or vomiting.    Past Medical History  Diagnosis Date  . COPD (chronic obstructive pulmonary disease) (HCC)   . Diabetes mellitus without complication (HCC)   . Hypertension   . CHF (congestive heart failure) (HCC)   . Severe recurrent major depression with psychotic features (HCC)   . Obstructive sleep apnea syndrome   . Primary generalized hypertrophic osteoarthrosis   . Dependence on supplemental oxygen   . Peripheral venous insufficiency   . Chronic ischemic heart disease, unspecified   . Anemia, unspecified   . Gout, unspecified   . Primary hypothyroidism   . Mixed hyperlipidemia   . 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 Diarrhea 11/28/2014  . AKI (acute kidney injury) (HCC) 11/28/2014  . COPD (chronic obstructive pulmonary disease) (HCC) 11/28/2014  . DM (diabetes mellitus) (HCC) 11/28/2014  . HTN (hypertension) 11/28/2014    Past Surgical History  Procedure Laterality Date  . Tonsillectomy      Current Outpatient Rx  Name  Route  Sig  Dispense  Refill  . allopurinol (ZYLOPRIM) 100 MG tablet   Oral   Take 100 mg by mouth daily.         Marland Kitchen amitriptyline  (ELAVIL) 50 MG tablet   Oral   Take 50 mg by mouth at bedtime.         Marland Kitchen aspirin EC 325 MG tablet   Oral   Take 325 mg by mouth daily.         . benazepril (LOTENSIN) 10 MG tablet   Oral   Take 10 mg by mouth daily.         . calcium-vitamin D (OSCAL WITH D) 250-125 MG-UNIT tablet   Oral   Take 2 tablets by mouth daily.         . clopidogrel (PLAVIX) 75 MG tablet   Oral   Take 75 mg by mouth daily.         . diazepam (VALIUM) 5 MG tablet   Oral   Take 5 mg by mouth 2 (two) times daily as needed for anxiety.         . docusate sodium (COLACE) 100 MG capsule   Oral   Take 50 mg by mouth 2 (two) times daily.         . DULoxetine (CYMBALTA) 60 MG capsule   Oral   Take 60 mg by mouth 2 (two) times daily.         . ferrous sulfate 325 (65 FE) MG tablet   Oral   Take 325 mg by mouth daily with breakfast.         . Fluticasone-Salmeterol (ADVAIR) 250-50 MCG/DOSE AEPB   Inhalation  Inhale 1 puff into the lungs 2 (two) times daily.         . folic acid (FOLVITE) 800 MCG tablet   Oral   Take 400 mcg by mouth daily.         Marland Kitchen. HYDROcodone-acetaminophen (NORCO/VICODIN) 5-325 MG tablet   Oral   Take 1 tablet by mouth 2 (two) times daily as needed for moderate pain.         . indomethacin (INDOCIN) 50 MG capsule   Oral   Take 50 mg by mouth 2 (two) times daily as needed.         . Insulin Aspart (NOVOLOG FLEXPEN [place])   Subcutaneous   Inject into the skin. As directed on sliding scale         . insulin NPH-regular Human (NOVOLIN 70/30) (70-30) 100 UNIT/ML injection   Subcutaneous   Inject 45 Units into the skin 2 (two) times daily with a meal.         . Ipratropium-Albuterol (COMBIVENT RESPIMAT) 20-100 MCG/ACT AERS respimat   Inhalation   Inhale 1 puff into the lungs every 6 (six) hours.         . isosorbide mononitrate (IMDUR) 60 MG 24 hr tablet   Oral   Take 60 mg by mouth 2 (two) times daily.         . Liraglutide (VICTOZA [place])    Subcutaneous   Inject 1.8 mg into the skin every morning.         . loratadine (CLARITIN) 10 MG tablet   Oral   Take 10 mg by mouth daily.         . metoprolol succinate (TOPROL-XL) 50 MG 24 hr tablet   Oral   Take 50 mg by mouth daily. Take with or immediately following a meal.         . nitroGLYCERIN (NITROLINGUAL) 0.4 MG/SPRAY spray   Sublingual   Place 1 spray under the tongue every 5 (five) minutes x 3 doses as needed for chest pain.         Marland Kitchen. nystatin (MYCOSTATIN/NYSTOP) 100000 UNIT/GM POWD   Topical   Apply topically 6 (six) times daily.         Marland Kitchen. omeprazole (PRILOSEC) 20 MG capsule   Oral   Take 20 mg by mouth 2 (two) times daily before a meal.         . potassium chloride (K-DUR,KLOR-CON) 10 MEQ tablet   Oral   Take 10 mEq by mouth 2 (two) times a week.         . simvastatin (ZOCOR) 80 MG tablet   Oral   Take 80 mg by mouth daily at 6 PM.         . traMADol (ULTRAM) 50 MG tablet   Oral   Take 50 mg by mouth 3 (three) times daily as needed.           Allergies Review of patient's allergies indicates no known allergies.  Family History  Problem Relation Age of Onset  . Hypertension Mother   . Diabetes Mother   . CAD Mother   . CAD Father   . Diabetes Father   . Hypertension Father   . Hypertension Sister   . Diabetes Sister   . CAD Sister     Social History Social History  Substance Use Topics  . Smoking status: Former Games developermoker  . Smokeless tobacco: None  . Alcohol Use: No    Review of Systems  Constitutional:  Negative for fever. Cardiovascular: Negative for chest pain. Respiratory: Negative for shortness of breath. Gastrointestinal: Positive for suprapubic pain. Neurological: Negative for headaches, focal weakness or numbness.  10-point ROS otherwise negative.  ____________________________________________   PHYSICAL EXAM:  VITAL SIGNS: ED Triage Vitals  Enc Vitals Group     BP 05/12/15 1110 128/103 mmHg     Pulse  Rate 05/12/15 1110 82     Resp 05/12/15 1110 22     Temp 05/12/15 1110 98.1 F (36.7 C)     Temp Source 05/12/15 1110 Oral     SpO2 05/12/15 1110 94 %     Weight 05/12/15 1110 357 lb (161.934 kg)     Height 05/12/15 1110  (1.702 m)     Head Cir --      Peak Flow --      Pain Score 05/12/15 1119 10   Constitutional: Alert and oriented. Morbidly obese.  Eyes: Conjunctivae are normal. PERRL. Normal extraocular movements. ENT   Head: Normocephalic and atraumatic.   Nose: No congestion/rhinnorhea.   Mouth/Throat: Mucous membranes are moist.   Neck: No stridor. Hematological/Lymphatic/Immunilogical: No cervical lymphadenopathy. Cardiovascular: Normal rate, regular rhythm.  No murmurs, rubs, or gallops. Respiratory: Normal respiratory effort without tachypnea nor retractions. Breath sounds are clear and equal bilaterally. No wheezes/rales/rhonchi. Gastrointestinal: Soft and nontender. No distention. There is no CVA tenderness. Genitourinary: No obvious vulvar abscess or tenderness. Some tenderness in the left suprapubic region without obvious fluctuance, erythema.  Musculoskeletal: Normal range of motion in all extremities. No joint effusions.  No lower extremity tenderness nor edema. Neurologic:  Normal speech and language. No gross focal neurologic deficits are appreciated.  Skin:  Skin is warm, dry. Some erythema to suprapubic region but no fluctuance. Minimally tender to palpation.  Psychiatric: Mood and affect are normal. Speech and behavior are normal. Patient exhibits appropriate insight and judgment.  ____________________________________________    LABS (pertinent positives/negatives)  Labs Reviewed  CBC WITH DIFFERENTIAL/PLATELET - Abnormal; Notable for the following:    RBC 2.87 (*)    Hemoglobin 7.8 (*)    HCT 23.9 (*)    RDW 18.6 (*)    Platelets 136 (*)    Lymphs Abs 0.5 (*)    All other components within normal limits  BASIC METABOLIC PANEL -  Abnormal; Notable for the following:    Glucose, Bld 186 (*)    Creatinine, Ser 1.02 (*)    Calcium 8.1 (*)    GFR calc non Af Amer 59 (*)    All other components within normal limits     ____________________________________________   EKG  None  ____________________________________________    RADIOLOGY  None   ____________________________________________   PROCEDURES  Procedure(s) performed: None  Critical Care performed: No  ____________________________________________   INITIAL IMPRESSION / ASSESSMENT AND PLAN / ED COURSE  Pertinent labs & imaging results that were available during my care of the patient were reviewed by me and considered in my medical decision making (see chart for details).  Patient presented to the emergency department today because of concerns for suprapubic pain. On exam some erythema in that area. I did perform a bedside ultrasound to investigate for possible abscess although clinically there was no fluctuance. Bedside ultrasound does show some cobblestoning of the subcutaneous tissue but no obvious pus collection. At this point will give dose of IV antibody  Here and discharge home with oral antibiotics for cellulitis. In terms of the patient's anemia she has seen Dr. Orlie Dakin in the  past. No signs of acute bleeding. Guaiac was negative. Vital signs not consistent with acute blood loss. I discussed with Dr. Orlie Dakin on the phone who will help arrange follow-up next week.  ____________________________________________   FINAL CLINICAL IMPRESSION(S) / ED DIAGNOSES  Final diagnoses:  Cellulitis, unspecified cellulitis site, unspecified extremity site, unspecified laterality  Anemia, unspecified anemia type     Phineas Semen, MD 05/12/15 1428

## 2015-06-01 ENCOUNTER — Inpatient Hospital Stay: Payer: Medicaid Other | Admitting: Oncology

## 2015-06-01 ENCOUNTER — Inpatient Hospital Stay: Payer: Medicaid Other | Attending: Oncology

## 2015-06-15 ENCOUNTER — Ambulatory Visit: Payer: Medicaid Other | Admitting: Oncology

## 2015-06-15 ENCOUNTER — Other Ambulatory Visit: Payer: Medicaid Other

## 2015-06-22 ENCOUNTER — Inpatient Hospital Stay: Payer: Medicaid Other | Admitting: Oncology

## 2015-06-22 ENCOUNTER — Inpatient Hospital Stay: Payer: Medicaid Other

## 2015-06-28 ENCOUNTER — Inpatient Hospital Stay (HOSPITAL_BASED_OUTPATIENT_CLINIC_OR_DEPARTMENT_OTHER): Payer: Medicaid Other | Admitting: Oncology

## 2015-06-28 ENCOUNTER — Inpatient Hospital Stay: Payer: Medicaid Other | Attending: Oncology

## 2015-06-28 VITALS — BP 129/59 | HR 74 | Temp 97.2°F | Resp 18

## 2015-06-28 DIAGNOSIS — Z79899 Other long term (current) drug therapy: Secondary | ICD-10-CM | POA: Diagnosis not present

## 2015-06-28 DIAGNOSIS — J449 Chronic obstructive pulmonary disease, unspecified: Secondary | ICD-10-CM | POA: Diagnosis not present

## 2015-06-28 DIAGNOSIS — Z9981 Dependence on supplemental oxygen: Secondary | ICD-10-CM | POA: Diagnosis not present

## 2015-06-28 DIAGNOSIS — Z7982 Long term (current) use of aspirin: Secondary | ICD-10-CM | POA: Diagnosis not present

## 2015-06-28 DIAGNOSIS — M109 Gout, unspecified: Secondary | ICD-10-CM | POA: Insufficient documentation

## 2015-06-28 DIAGNOSIS — N189 Chronic kidney disease, unspecified: Secondary | ICD-10-CM

## 2015-06-28 DIAGNOSIS — E039 Hypothyroidism, unspecified: Secondary | ICD-10-CM

## 2015-06-28 DIAGNOSIS — E782 Mixed hyperlipidemia: Secondary | ICD-10-CM

## 2015-06-28 DIAGNOSIS — I129 Hypertensive chronic kidney disease with stage 1 through stage 4 chronic kidney disease, or unspecified chronic kidney disease: Secondary | ICD-10-CM

## 2015-06-28 DIAGNOSIS — Z794 Long term (current) use of insulin: Secondary | ICD-10-CM | POA: Insufficient documentation

## 2015-06-28 DIAGNOSIS — I872 Venous insufficiency (chronic) (peripheral): Secondary | ICD-10-CM | POA: Diagnosis not present

## 2015-06-28 DIAGNOSIS — D61818 Other pancytopenia: Secondary | ICD-10-CM

## 2015-06-28 DIAGNOSIS — E1122 Type 2 diabetes mellitus with diabetic chronic kidney disease: Secondary | ICD-10-CM | POA: Insufficient documentation

## 2015-06-28 DIAGNOSIS — I509 Heart failure, unspecified: Secondary | ICD-10-CM | POA: Diagnosis not present

## 2015-06-28 DIAGNOSIS — I259 Chronic ischemic heart disease, unspecified: Secondary | ICD-10-CM | POA: Diagnosis not present

## 2015-06-28 DIAGNOSIS — G4733 Obstructive sleep apnea (adult) (pediatric): Secondary | ICD-10-CM | POA: Diagnosis not present

## 2015-06-28 DIAGNOSIS — Z87891 Personal history of nicotine dependence: Secondary | ICD-10-CM | POA: Diagnosis not present

## 2015-06-28 LAB — CBC WITH DIFFERENTIAL/PLATELET
BASOS ABS: 0 10*3/uL (ref 0–0.1)
Basophils Relative: 0 %
EOS PCT: 1 %
Eosinophils Absolute: 0 10*3/uL (ref 0–0.7)
HEMATOCRIT: 29.2 % — AB (ref 35.0–47.0)
Hemoglobin: 9.8 g/dL — ABNORMAL LOW (ref 12.0–16.0)
LYMPHS PCT: 12 %
Lymphs Abs: 0.8 10*3/uL — ABNORMAL LOW (ref 1.0–3.6)
MCH: 27.5 pg (ref 26.0–34.0)
MCHC: 33.7 g/dL (ref 32.0–36.0)
MCV: 81.5 fL (ref 80.0–100.0)
MONO ABS: 0.3 10*3/uL (ref 0.2–0.9)
MONOS PCT: 4 %
Neutro Abs: 5.6 10*3/uL (ref 1.4–6.5)
Neutrophils Relative %: 83 %
PLATELETS: 150 10*3/uL (ref 150–440)
RBC: 3.58 MIL/uL — ABNORMAL LOW (ref 3.80–5.20)
RDW: 17 % — AB (ref 11.5–14.5)
WBC: 6.8 10*3/uL (ref 3.6–11.0)

## 2015-07-02 NOTE — Progress Notes (Signed)
[place]  Telephone:([phone]) [phone] Fax:(336) [phone]  ID: Deanna Rice OB: [birth year]  MR#: [id number]  XLK#:[id number]  Patient Care Team: Lavera Guise, MD as PCP - General (Internal Medicine)  CHIEF COMPLAINT:  Chief Complaint  Patient presents with  . Anemia  . Pancytopenia    INTERVAL HISTORY: Patient returns to clinic today for repeat laboratory work and further evaluation. She currently feels well and is at her baseline. She has no neurologic complaints. She denies any fevers. She denies any chest pain or shortness of breath. She denies any nausea or vomiting. She has no melena or hematochezia. She has no urinary complaints. Patient offers no specific complaints today.  REVIEW OF SYSTEMS:   Review of Systems  Constitutional: Negative.  Negative for fever and malaise/fatigue.  Respiratory: Negative.   Cardiovascular: Negative.   Gastrointestinal: Negative.  Negative for blood in stool and melena.  Musculoskeletal: Negative.   Neurological: Negative.  Negative for weakness.    As per HPI. Otherwise, a complete review of systems is negatve.  PAST MEDICAL HISTORY: Past Medical History  Diagnosis Date  . COPD (chronic obstructive pulmonary disease) ([place])   . Diabetes mellitus without complication ([place])   . Hypertension   . CHF (congestive heart failure) ([facility])   . Severe recurrent major depression with psychotic features ([place])   . Obstructive sleep apnea syndrome   . Primary generalized hypertrophic osteoarthrosis   . Dependence on supplemental oxygen   . Peripheral venous insufficiency   . Chronic ischemic heart disease, unspecified   . Anemia, unspecified   . Gout, unspecified   . Primary hypothyroidism   . Mixed hyperlipidemia   . Obstructive chronic bronchitis without exacerbation ([place])     PAST SURGICAL HISTORY: Past Surgical History  Procedure Laterality Date  . Tonsillectomy      FAMILY HISTORY Family History  Problem Relation Age  of Onset  . Hypertension Mother   . Diabetes Mother   . CAD Mother   . CAD Father   . Diabetes Father   . Hypertension Father   . Hypertension Sister   . Diabetes Sister   . CAD Sister        ADVANCED DIRECTIVES:    HEALTH MAINTENANCE: Social History  Substance Use Topics  . Smoking status: Former Research scientist (life sciences)  . Smokeless tobacco: Not on file  . Alcohol Use: No     Colonoscopy:  PAP:  Bone density:  Lipid panel:  No Known Allergies  Current Outpatient Prescriptions  Medication Sig Dispense Refill  . allopurinol (ZYLOPRIM) 100 MG tablet Take 100 mg by mouth daily.    Marland Kitchen amitriptyline (ELAVIL) 50 MG tablet Take 25 mg by mouth at bedtime.     Marland Kitchen aspirin EC 325 MG tablet Take 325 mg by mouth daily.    . calcium-vitamin D (OSCAL WITH D) 250-125 MG-UNIT tablet Take 2 tablets by mouth daily.    . diazepam (VALIUM) 5 MG tablet Take 5 mg by mouth 2 (two) times daily as needed for anxiety.    . DULoxetine (CYMBALTA) 60 MG capsule Take 60 mg by mouth 2 (two) times daily.    . Fluticasone-Salmeterol (ADVAIR) 250-50 MCG/DOSE AEPB Inhale 1 puff into the lungs 2 (two) times daily.    . indomethacin (INDOCIN) 50 MG capsule Take 50 mg by mouth 2 (two) times daily as needed. Reported on 06/28/2015    . Ipratropium-Albuterol (COMBIVENT RESPIMAT) 20-100 MCG/ACT AERS respimat Inhale 1 puff into the lungs every 6 (six) hours.    Marland Kitchen  isosorbide mononitrate (IMDUR) 60 MG 24 hr tablet Take 60 mg by mouth 2 (two) times daily.    . Liraglutide (VICTOZA [place]) Inject 1.8 mg into the skin every morning.    . loratadine (CLARITIN) 10 MG tablet Take 10 mg by mouth daily.    . metoprolol succinate (TOPROL-XL) 50 MG 24 hr tablet Take 50 mg by mouth daily. Take with or immediately following a meal.    . nitroGLYCERIN (NITROLINGUAL) 0.4 MG/SPRAY spray Place 1 spray under the tongue every 5 (five) minutes x 3 doses as needed for chest pain.    Marland Kitchen nystatin (MYCOSTATIN/NYSTOP) 100000 UNIT/GM POWD Apply topically 6 (six)  times daily.    . simvastatin (ZOCOR) 80 MG tablet Take 80 mg by mouth daily at 6 PM.    . traMADol (ULTRAM) 50 MG tablet Take 50 mg by mouth 3 (three) times daily as needed.    . benazepril (LOTENSIN) 10 MG tablet Take 10 mg by mouth daily. Reported on 06/28/2015    . clindamycin (CLEOCIN) 300 MG capsule Take 1 capsule (300 mg total) by mouth 4 (four) times daily. (Patient not taking: Reported on 06/28/2015) 40 capsule 0  . clopidogrel (PLAVIX) 75 MG tablet Take 75 mg by mouth daily. Reported on 06/28/2015    . docusate sodium (COLACE) 100 MG capsule Take 50 mg by mouth 2 (two) times daily. Reported on 06/28/2015    . ferrous sulfate 325 (65 FE) MG tablet Take 325 mg by mouth daily with breakfast. Reported on 06/26/8839    . folic acid (FOLVITE) 660 MCG tablet Take 400 mcg by mouth daily. Reported on 06/28/2015    . HYDROcodone-acetaminophen (NORCO/VICODIN) 5-325 MG tablet Take 1 tablet by mouth 2 (two) times daily as needed for moderate pain. Reported on 06/28/2015    . Insulin Aspart (NOVOLOG FLEXPEN [place]) Inject into the skin. Reported on 06/28/2015    . insulin NPH-regular Human (NOVOLIN 70/30) (70-30) 100 UNIT/ML injection Inject 45 Units into the skin 2 (two) times daily with a meal. Reported on 06/28/2015    . omeprazole (PRILOSEC) 20 MG capsule Take 20 mg by mouth 2 (two) times daily before a meal. Reported on 06/28/2015    . potassium chloride (K-DUR,KLOR-CON) 10 MEQ tablet Take 10 mEq by mouth 2 (two) times a week. Reported on 06/28/2015     No current facility-administered medications for this visit.    OBJECTIVE: Filed Vitals:   06/28/15 1205  BP: 129/59  Pulse: 74  Temp: 97.2 F (36.2 C)  Resp: 18     There is no weight on file to calculate BMI.    ECOG FS:0 - Asymptomatic  General:  Obese, sitting in a wheelchair. no acute distress. Eyes: Pink conjunctiva, anicteric sclera. Lungs: Clear to auscultation bilaterally. Heart: Regular rate and rhythm. No rubs, murmurs, or gallops. Abdomen: Soft,  nontender, nondistended. No organomegaly noted, normoactive bowel sounds. Musculoskeletal: No edema, cyanosis, or clubbing. Neuro: Alert, answering all questions appropriately. Cranial nerves grossly intact. Skin: No rashes or petechiae noted. Psych: Normal affect.   LAB RESULTS:  Lab Results  Component Value Date   NA 135 05/12/2015   K 4.9 05/12/2015   CL 102 05/12/2015   CO2 28 05/12/2015   GLUCOSE 186* 05/12/2015   BUN 18 05/12/2015   CREATININE 1.02* 05/12/2015   CALCIUM 8.1* 05/12/2015   PROT 7.5 11/28/2014   ALBUMIN 4.0 11/28/2014   AST 17 11/28/2014   ALT 14 11/28/2014   ALKPHOS 135* 11/28/2014   BILITOT  0.4 11/28/2014   GFRNONAA 59* 05/12/2015   GFRAA >60 05/12/2015    Lab Results  Component Value Date   WBC 6.8 06/28/2015   NEUTROABS 5.6 06/28/2015   HGB 9.8* 06/28/2015   HCT 29.2* 06/28/2015   MCV 81.5 06/28/2015   PLT 150 06/28/2015   Lab Results  Component Value Date   IRON 36 12/22/2014   TIBC 276 12/22/2014   IRONPCTSAT 13 12/22/2014    Lab Results  Component Value Date   FERRITIN 100 12/22/2014     STUDIES: No results found.  ASSESSMENT: Anemia and thrombocytopenia:  PLAN:    1. Anemia: Patient's hemoglobin is decreased, but stable at 9.2. Previously, all of her labwork including iron stores, B12, folate, hemolysis labs, and ANA were within normal limits. Her erythropoietin level is appropriately elevated.  No intervention is needed at this time. Return to clinic in 3 months with repeat laboratory work and further evaluation. If there is not continued improvement of her blood counts, can consider a bone marrow biopsy in the future. 2. Thrombocytopenia: Patient's platelet count is now within normal limits. All of her labwork as above including platelet antibodies are negative. 3. Chronic renal insufficiency: Mild, although patient has an elevated erythropoietin level she may benefit from Procrit in the future.  Patient expressed  understanding and was in agreement with this plan. She also understands that She can call clinic at any time with any questions, concerns, or complaints.   Lloyd Huger, MD   07/02/2015 10:33 AM

## 2015-08-01 ENCOUNTER — Ambulatory Visit: Payer: Self-pay | Admitting: Gastroenterology

## 2015-09-28 ENCOUNTER — Other Ambulatory Visit: Payer: Self-pay

## 2015-09-28 ENCOUNTER — Inpatient Hospital Stay: Payer: Medicaid Other | Attending: Oncology

## 2015-09-28 ENCOUNTER — Inpatient Hospital Stay (HOSPITAL_BASED_OUTPATIENT_CLINIC_OR_DEPARTMENT_OTHER): Payer: Medicaid Other | Admitting: Oncology

## 2015-09-28 DIAGNOSIS — E782 Mixed hyperlipidemia: Secondary | ICD-10-CM

## 2015-09-28 DIAGNOSIS — I739 Peripheral vascular disease, unspecified: Secondary | ICD-10-CM | POA: Insufficient documentation

## 2015-09-28 DIAGNOSIS — Z7982 Long term (current) use of aspirin: Secondary | ICD-10-CM | POA: Diagnosis not present

## 2015-09-28 DIAGNOSIS — D631 Anemia in chronic kidney disease: Secondary | ICD-10-CM

## 2015-09-28 DIAGNOSIS — M1990 Unspecified osteoarthritis, unspecified site: Secondary | ICD-10-CM | POA: Diagnosis not present

## 2015-09-28 DIAGNOSIS — R531 Weakness: Secondary | ICD-10-CM

## 2015-09-28 DIAGNOSIS — J449 Chronic obstructive pulmonary disease, unspecified: Secondary | ICD-10-CM

## 2015-09-28 DIAGNOSIS — E039 Hypothyroidism, unspecified: Secondary | ICD-10-CM | POA: Diagnosis not present

## 2015-09-28 DIAGNOSIS — R5383 Other fatigue: Secondary | ICD-10-CM | POA: Insufficient documentation

## 2015-09-28 DIAGNOSIS — D696 Thrombocytopenia, unspecified: Secondary | ICD-10-CM

## 2015-09-28 DIAGNOSIS — I130 Hypertensive heart and chronic kidney disease with heart failure and stage 1 through stage 4 chronic kidney disease, or unspecified chronic kidney disease: Secondary | ICD-10-CM

## 2015-09-28 DIAGNOSIS — G4733 Obstructive sleep apnea (adult) (pediatric): Secondary | ICD-10-CM | POA: Diagnosis not present

## 2015-09-28 DIAGNOSIS — I10 Essential (primary) hypertension: Secondary | ICD-10-CM

## 2015-09-28 DIAGNOSIS — Z79899 Other long term (current) drug therapy: Secondary | ICD-10-CM | POA: Insufficient documentation

## 2015-09-28 DIAGNOSIS — N189 Chronic kidney disease, unspecified: Secondary | ICD-10-CM | POA: Insufficient documentation

## 2015-09-28 DIAGNOSIS — Z87891 Personal history of nicotine dependence: Secondary | ICD-10-CM | POA: Insufficient documentation

## 2015-09-28 DIAGNOSIS — D61818 Other pancytopenia: Secondary | ICD-10-CM

## 2015-09-28 DIAGNOSIS — R5381 Other malaise: Secondary | ICD-10-CM

## 2015-09-28 DIAGNOSIS — E1122 Type 2 diabetes mellitus with diabetic chronic kidney disease: Secondary | ICD-10-CM

## 2015-09-28 LAB — CBC WITH DIFFERENTIAL/PLATELET
BASOS ABS: 0 10*3/uL (ref 0–0.1)
Eosinophils Absolute: 0.1 10*3/uL (ref 0–0.7)
Eosinophils Relative: 1 %
HEMATOCRIT: 28.1 % — AB (ref 35.0–47.0)
HEMOGLOBIN: 9.8 g/dL — AB (ref 12.0–16.0)
Lymphocytes Relative: 23 %
Lymphs Abs: 1.3 10*3/uL (ref 1.0–3.6)
MCH: 29.6 pg (ref 26.0–34.0)
MCHC: 34.9 g/dL (ref 32.0–36.0)
MCV: 84.9 fL (ref 80.0–100.0)
Monocytes Absolute: 0.3 10*3/uL (ref 0.2–0.9)
Monocytes Relative: 5 %
NEUTROS ABS: 4 10*3/uL (ref 1.4–6.5)
Platelets: 71 10*3/uL — ABNORMAL LOW (ref 150–440)
RBC: 3.31 MIL/uL — AB (ref 3.80–5.20)
RDW: 15.8 % — AB (ref 11.5–14.5)
WBC: 5.7 10*3/uL (ref 3.6–11.0)

## 2015-09-28 NOTE — Progress Notes (Signed)
Deanna Rice  [phone] [phone]  ID: Deanna Rice OB: [birth year]  MR#: [id number]  BMW#:[id number]  Patient Care Team: Lavera Guise, MD as PCP - General (Internal Medicine)  CHIEF COMPLAINT: Anemia in chronic kidney disease and thrombocytopenia  INTERVAL HISTORY: Patient returns to clinic today for repeat laboratory work and further evaluation. She currently feels well and is at her baseline. She has chronic weakness and shortness of breath. She has no neurologic complaints. She denies any fevers. She denies any chest pain. She denies any nausea or vomiting. She has no melena or hematochezia. She has no urinary complaints. Patient offers no further specific complaints today.  REVIEW OF SYSTEMS:   Review of Systems  Constitutional: Positive for malaise/fatigue. Negative for fever and weight loss.  Respiratory: Positive for shortness of breath. Negative for cough.   Cardiovascular: Negative.  Negative for chest pain.  Gastrointestinal: Negative.  Negative for abdominal pain, blood in stool and melena.  Genitourinary: Negative.   Musculoskeletal: Negative.   Neurological: Positive for weakness.  Psychiatric/Behavioral: Negative.     As per HPI. Otherwise, a complete review of systems is negatve.  PAST MEDICAL HISTORY: Past Medical History:  Diagnosis Date  . Anemia, unspecified   . CHF (congestive heart failure) ([place])   . Chronic ischemic heart disease, unspecified   . COPD (chronic obstructive pulmonary disease) ([place])   . Dependence on supplemental oxygen   . Diabetes mellitus without complication ([place])   . Gout, unspecified   . Hypertension   . Mixed hyperlipidemia   . Obstructive chronic bronchitis without exacerbation ([place])   . Obstructive sleep apnea syndrome   . Peripheral venous insufficiency   . Primary generalized hypertrophic osteoarthrosis   . Primary hypothyroidism   . Severe recurrent major depression with psychotic  features ([place])     PAST SURGICAL HISTORY: Past Surgical History:  Procedure Laterality Date  . TONSILLECTOMY      FAMILY HISTORY Family History  Problem Relation Age of Onset  . Hypertension Mother   . Diabetes Mother   . CAD Mother   . CAD Father   . Diabetes Father   . Hypertension Father   . Hypertension Sister   . Diabetes Sister   . CAD Sister        ADVANCED DIRECTIVES:    HEALTH MAINTENANCE: Social History  Substance Use Topics  . Smoking status: Former Research scientist (life sciences)  . Smokeless tobacco: Not on file  . Alcohol use No     Colonoscopy:  PAP:  Bone density:  Lipid panel:  No Known Allergies  Current Outpatient Prescriptions  Medication Sig Dispense Refill  . allopurinol (ZYLOPRIM) 100 MG tablet Take 100 mg by mouth daily.    Marland Kitchen amitriptyline (ELAVIL) 50 MG tablet Take 25 mg by mouth at bedtime.     Marland Kitchen aspirin EC 325 MG tablet Take 325 mg by mouth daily.    . benazepril (LOTENSIN) 10 MG tablet Take 10 mg by mouth daily. Reported on 06/28/2015    . calcium-vitamin D (OSCAL WITH D) 250-125 MG-UNIT tablet Take 2 tablets by mouth daily.    . clopidogrel (PLAVIX) 75 MG tablet Take 75 mg by mouth daily. Reported on 06/28/2015    . docusate sodium (COLACE) 100 MG capsule Take 50 mg by mouth 2 (two) times daily. Reported on 06/28/2015    . DULoxetine (CYMBALTA) 60 MG capsule Take 60 mg by mouth 2 (two) times daily.    . ferrous sulfate 325 (65 FE)  MG tablet Take 325 mg by mouth daily with breakfast. Reported on 06/28/2015    . Fluticasone-Salmeterol (ADVAIR) 250-50 MCG/DOSE AEPB Inhale 1 puff into the lungs 2 (two) times daily.    . folic acid (FOLVITE) 818 MCG tablet Take 400 mcg by mouth daily. Reported on 06/28/2015    . Insulin Aspart (NOVOLOG FLEXPEN [place]) Inject into the skin. Reported on 06/28/2015    . Ipratropium-Albuterol (COMBIVENT RESPIMAT) 20-100 MCG/ACT AERS respimat Inhale 1 puff into the lungs every 6 (six) hours.    . isosorbide mononitrate (IMDUR) 60 MG 24 hr tablet  Take 60 mg by mouth 2 (two) times daily.    . Liraglutide (VICTOZA [place]) Inject 1.8 mg into the skin every morning.    . loratadine (CLARITIN) 10 MG tablet Take 10 mg by mouth daily.    . metoprolol succinate (TOPROL-XL) 50 MG 24 hr tablet Take 50 mg by mouth daily. Take with or immediately following a meal.    . nitroGLYCERIN (NITROLINGUAL) 0.4 MG/SPRAY spray Place 1 spray under the tongue every 5 (five) minutes x 3 doses as needed for chest pain.    Marland Kitchen nystatin (MYCOSTATIN/NYSTOP) 100000 UNIT/GM POWD Apply topically 6 (six) times daily.    Marland Kitchen omeprazole (PRILOSEC) 20 MG capsule Take 20 mg by mouth 2 (two) times daily before a meal. Reported on 06/28/2015    . potassium chloride (K-DUR,KLOR-CON) 10 MEQ tablet Take 10 mEq by mouth 2 (two) times a week. Reported on 06/28/2015    . simvastatin (ZOCOR) 80 MG tablet Take 80 mg by mouth daily at 6 PM.    . traMADol (ULTRAM) 50 MG tablet Take 50 mg by mouth 3 (three) times daily as needed.     No current facility-administered medications for this visit.     OBJECTIVE: Vitals:   09/28/15 1521  BP: 134/72  Pulse: 82  Resp: (!) 21  Temp: 97.2 F (36.2 C)     Body mass index is 57.18 kg/m.    ECOG FS:0 - Asymptomatic  General:  Obese, sitting in a wheelchair. no acute distress. Eyes: Pink conjunctiva, anicteric sclera. Lungs: Clear to auscultation bilaterally. Heart: Regular rate and rhythm. No rubs, murmurs, or gallops. Abdomen: Soft, nontender, nondistended. No organomegaly noted, normoactive bowel sounds. Musculoskeletal: No edema, cyanosis, or clubbing. Neuro: Alert, answering all questions appropriately. Cranial nerves grossly intact. Skin: No rashes or petechiae noted. Psych: Normal affect.   LAB RESULTS:  Lab Results  Component Value Date   NA 135 05/12/2015   K 4.9 05/12/2015   CL 102 05/12/2015   CO2 28 05/12/2015   GLUCOSE 186 (H) 05/12/2015   BUN 18 05/12/2015   CREATININE 1.02 (H) 05/12/2015   CALCIUM 8.1 (L) 05/12/2015    PROT 7.5 11/28/2014   ALBUMIN 4.0 11/28/2014   AST 17 11/28/2014   ALT 14 11/28/2014   ALKPHOS 135 (H) 11/28/2014   BILITOT 0.4 11/28/2014   GFRNONAA 59 (L) 05/12/2015   GFRAA >60 05/12/2015    Lab Results  Component Value Date   WBC 5.7 09/28/2015   NEUTROABS 4.0 09/28/2015   HGB 9.8 (L) 09/28/2015   HCT 28.1 (L) 09/28/2015   MCV 84.9 09/28/2015   PLT 71 (L) 09/28/2015   Lab Results  Component Value Date   IRON 36 12/22/2014   TIBC 276 12/22/2014   IRONPCTSAT 13 12/22/2014    Lab Results  Component Value Date   FERRITIN 100 12/22/2014     STUDIES: No results found.  ASSESSMENT: Anemia in  chronic kidney disease and thrombocytopenia:  PLAN:    1. Anemia in chronic kidney disease: Patient's hemoglobin is decreased, but stable at 9.8. Previously, all of her labwork including iron stores, B12, folate, hemolysis labs, and ANA were within normal limits. Her erythropoietin level is appropriately elevated.  No intervention is needed at this time. Return to clinic in 6 months for laboratory work only and then in 3 months for further evaluation.  2. Thrombocytopenia: Patient's platelet count has significantly declined is now 71. Previously, all of her laboratory work was either negative or within normal limits. Follow-up as above. Consider bone marrow biopsy in the future if no improvement in her blood counts. 3. Chronic renal insufficiency: Mild, although patient has an elevated erythropoietin level she may benefit from Procrit in the future.  Patient expressed understanding and was in agreement with this plan. She also understands that She can call clinic at any time with any questions, concerns, or complaints.   Lloyd Huger, MD   09/29/2015 12:47 AM

## 2015-09-28 NOTE — Progress Notes (Signed)
Pt reports always having fatigue.  SOB on exertion on 4L O2 via [place].

## 2015-11-09 ENCOUNTER — Inpatient Hospital Stay: Payer: Medicaid Other

## 2015-11-29 ENCOUNTER — Inpatient Hospital Stay: Payer: Medicaid Other | Attending: Oncology

## 2015-11-29 ENCOUNTER — Other Ambulatory Visit: Payer: Self-pay

## 2015-11-29 DIAGNOSIS — D696 Thrombocytopenia, unspecified: Secondary | ICD-10-CM | POA: Insufficient documentation

## 2015-11-29 DIAGNOSIS — N189 Chronic kidney disease, unspecified: Secondary | ICD-10-CM | POA: Diagnosis not present

## 2015-11-29 DIAGNOSIS — I130 Hypertensive heart and chronic kidney disease with heart failure and stage 1 through stage 4 chronic kidney disease, or unspecified chronic kidney disease: Secondary | ICD-10-CM | POA: Diagnosis present

## 2015-11-29 DIAGNOSIS — E1122 Type 2 diabetes mellitus with diabetic chronic kidney disease: Secondary | ICD-10-CM | POA: Insufficient documentation

## 2015-11-29 LAB — CBC WITH DIFFERENTIAL/PLATELET
BASOS ABS: 0 10*3/uL (ref 0–0.1)
Basophils Relative: 0 %
EOS ABS: 0 10*3/uL (ref 0–0.7)
EOS PCT: 1 %
HCT: 30.8 % — ABNORMAL LOW (ref 35.0–47.0)
Hemoglobin: 10.7 g/dL — ABNORMAL LOW (ref 12.0–16.0)
LYMPHS PCT: 18 %
Lymphs Abs: 1.3 10*3/uL (ref 1.0–3.6)
MCH: 29.5 pg (ref 26.0–34.0)
MCHC: 34.7 g/dL (ref 32.0–36.0)
MCV: 85.1 fL (ref 80.0–100.0)
MONO ABS: 0.4 10*3/uL (ref 0.2–0.9)
Monocytes Relative: 6 %
Neutro Abs: 5.3 10*3/uL (ref 1.4–6.5)
Neutrophils Relative %: 75 %
PLATELETS: 82 10*3/uL — AB (ref 150–440)
RBC: 3.62 MIL/uL — ABNORMAL LOW (ref 3.80–5.20)
RDW: 15.1 % — AB (ref 11.5–14.5)
WBC: 7.1 10*3/uL (ref 3.6–11.0)

## 2015-12-28 ENCOUNTER — Encounter: Payer: Self-pay | Admitting: Emergency Medicine

## 2015-12-28 ENCOUNTER — Emergency Department
Admission: EM | Admit: 2015-12-28 | Discharge: 2015-12-29 | Discharge status: Against Medical Advice | Payer: Medicaid Other | Attending: Emergency Medicine | Admitting: Emergency Medicine

## 2015-12-28 ENCOUNTER — Emergency Department: Payer: Medicaid Other

## 2015-12-28 DIAGNOSIS — I110 Hypertensive heart disease with heart failure: Secondary | ICD-10-CM | POA: Diagnosis not present

## 2015-12-28 DIAGNOSIS — L03119 Cellulitis of unspecified part of limb: Secondary | ICD-10-CM

## 2015-12-28 DIAGNOSIS — J449 Chronic obstructive pulmonary disease, unspecified: Secondary | ICD-10-CM | POA: Diagnosis not present

## 2015-12-28 DIAGNOSIS — N3000 Acute cystitis without hematuria: Secondary | ICD-10-CM | POA: Diagnosis not present

## 2015-12-28 DIAGNOSIS — Z794 Long term (current) use of insulin: Secondary | ICD-10-CM | POA: Insufficient documentation

## 2015-12-28 DIAGNOSIS — L98491 Non-pressure chronic ulcer of skin of other sites limited to breakdown of skin: Secondary | ICD-10-CM | POA: Diagnosis not present

## 2015-12-28 DIAGNOSIS — L03115 Cellulitis of right lower limb: Secondary | ICD-10-CM | POA: Diagnosis not present

## 2015-12-28 DIAGNOSIS — Z7982 Long term (current) use of aspirin: Secondary | ICD-10-CM | POA: Diagnosis not present

## 2015-12-28 DIAGNOSIS — L03116 Cellulitis of left lower limb: Secondary | ICD-10-CM | POA: Diagnosis not present

## 2015-12-28 DIAGNOSIS — I509 Heart failure, unspecified: Secondary | ICD-10-CM | POA: Diagnosis not present

## 2015-12-28 DIAGNOSIS — Z79899 Other long term (current) drug therapy: Secondary | ICD-10-CM | POA: Insufficient documentation

## 2015-12-28 DIAGNOSIS — Z87891 Personal history of nicotine dependence: Secondary | ICD-10-CM | POA: Insufficient documentation

## 2015-12-28 DIAGNOSIS — R410 Disorientation, unspecified: Secondary | ICD-10-CM | POA: Insufficient documentation

## 2015-12-28 DIAGNOSIS — T148XXA Other injury of unspecified body region, initial encounter: Secondary | ICD-10-CM

## 2015-12-28 DIAGNOSIS — E119 Type 2 diabetes mellitus without complications: Secondary | ICD-10-CM | POA: Diagnosis not present

## 2015-12-28 DIAGNOSIS — R4182 Altered mental status, unspecified: Secondary | ICD-10-CM | POA: Diagnosis present

## 2015-12-28 LAB — URINALYSIS COMPLETE WITH MICROSCOPIC (ARMC ONLY)
Bilirubin Urine: NEGATIVE
Glucose, UA: NEGATIVE mg/dL
KETONES UR: NEGATIVE mg/dL
Nitrite: POSITIVE — AB
PH: 6 (ref 5.0–8.0)
PROTEIN: 100 mg/dL — AB
SPECIFIC GRAVITY, URINE: 1.003 — AB (ref 1.005–1.030)

## 2015-12-28 LAB — BLOOD GAS, VENOUS
Acid-Base Excess: 2.3 mmol/L — ABNORMAL HIGH (ref 0.0–2.0)
Bicarbonate: 27.3 mmol/L (ref 20.0–28.0)
O2 Saturation: 94.8 %
PATIENT TEMPERATURE: 37
PH VEN: 7.41 (ref 7.250–7.430)
pCO2, Ven: 43 mmHg — ABNORMAL LOW (ref 44.0–60.0)
pO2, Ven: 74 mmHg — ABNORMAL HIGH (ref 32.0–45.0)

## 2015-12-28 LAB — CBC
HCT: 30.2 % — ABNORMAL LOW (ref 35.0–47.0)
Hemoglobin: 10.5 g/dL — ABNORMAL LOW (ref 12.0–16.0)
MCH: 30.2 pg (ref 26.0–34.0)
MCHC: 34.7 g/dL (ref 32.0–36.0)
MCV: 87.1 fL (ref 80.0–100.0)
Platelets: 152 10*3/uL (ref 150–440)
RBC: 3.47 MIL/uL — ABNORMAL LOW (ref 3.80–5.20)
RDW: 15.1 % — ABNORMAL HIGH (ref 11.5–14.5)
WBC: 9.1 10*3/uL (ref 3.6–11.0)

## 2015-12-28 LAB — COMPREHENSIVE METABOLIC PANEL
ALT: 15 U/L (ref 14–54)
AST: 21 U/L (ref 15–41)
Albumin: 4 g/dL (ref 3.5–5.0)
Alkaline Phosphatase: 45 U/L (ref 38–126)
Anion gap: 9 (ref 5–15)
BUN: 32 mg/dL — ABNORMAL HIGH (ref 6–20)
CO2: 28 mmol/L (ref 22–32)
Calcium: 8.6 mg/dL — ABNORMAL LOW (ref 8.9–10.3)
Chloride: 96 mmol/L — ABNORMAL LOW (ref 101–111)
Creatinine, Ser: 1.21 mg/dL — ABNORMAL HIGH (ref 0.44–1.00)
GFR calc Af Amer: 55 mL/min — ABNORMAL LOW (ref >60)
GFR calc non Af Amer: 47 mL/min — ABNORMAL LOW (ref >60)
Glucose, Bld: 85 mg/dL (ref 65–99)
Potassium: 3.9 mmol/L (ref 3.5–5.1)
Sodium: 133 mmol/L — ABNORMAL LOW (ref 135–145)
Total Bilirubin: 0.6 mg/dL (ref 0.3–1.2)
Total Protein: 7.2 g/dL (ref 6.5–8.1)

## 2015-12-28 LAB — GLUCOSE, CAPILLARY: Glucose-Capillary: 91 mg/dL (ref 65–99)

## 2015-12-28 LAB — C-REACTIVE PROTEIN: CRP: 0.9 mg/dL (ref <1.0)

## 2015-12-28 LAB — SEDIMENTATION RATE: Sed Rate: 66 mm/hr — ABNORMAL HIGH (ref 0–30)

## 2015-12-28 MED ORDER — VANCOMYCIN HCL 10 G IV SOLR
2000.0000 mg | Freq: Once | INTRAVENOUS | Status: DC
  Filled 2015-12-28: qty 2000

## 2015-12-28 MED ORDER — CEPHALEXIN 500 MG PO CAPS: 500.0000 mg | ORAL_CAPSULE | Freq: Four times a day (QID) | ORAL | 0 refills | Status: AC

## 2015-12-28 MED ORDER — VANCOMYCIN HCL 10 G IV SOLR
2000.0000 mg | Freq: Once | INTRAVENOUS | Status: AC
  Administered 2015-12-28: 2000 mg via INTRAVENOUS
  Filled 2015-12-28: qty 2000

## 2015-12-28 MED ORDER — VANCOMYCIN HCL IN DEXTROSE 1-5 GM/200ML-% IV SOLN
INTRAVENOUS | Status: AC
  Filled 2015-12-28: qty 400

## 2015-12-28 MED ORDER — SODIUM CHLORIDE 0.9 % IV BOLUS (SEPSIS)
500.0000 mL | Freq: Once | INTRAVENOUS | Status: AC
  Administered 2015-12-28: 500 mL via INTRAVENOUS

## 2015-12-28 NOTE — Discharge Instructions (Signed)
Today you have 3 infections: a urinary tract infection, nonhealing wound infection, and an infection of the skin around your legs. You have refused to stay for IV antibiotics, which is the safest and best treatment for your condition. I will discharge you home with oral antibiotics which should treat both of your infections. Please make an appointment with your primary care doctor tomorrow for wound reevaluation.  When you get home, please make sure that the tubing tear oxygen is intact, plugged in, and turned onto the right number of liters per hour.  Return to the emergency department if you develop severe pain, fever, changes in mental status, nausea or vomiting, worsening of your rash or swelling in her legs, or any other symptoms concerning to you.

## 2015-12-28 NOTE — ED Provider Notes (Signed)
[facility] Emergency Department Provider Note  ____________________________________________  Time seen: Approximately 7:57 PM  I have reviewed the triage vital signs and the nursing notes.   HISTORY  Chief Complaint Altered Mental Status    HPI Deanna Rice is a 60 y.o. female with CHF, COPD on 2 L nasal cannula, DM, HTN, HL, and morbid obesity presenting for altered mental status. The patient is followed by social worker whom she has known for at least 2 years, and today she did not recognize the Child psychotherapistsocial worker. The patient states this is happened to her in the past when her oxygen tubing has been broken. At this time, she states "I know it was my oxygen because I'm feeling completely better." She does report recent urinary burning denies any hematuria, frequency, fever, chills, nausea or vomiting. She has not had any recent changes in her medications which she has not been having any cough or cold symptoms. She has had no headache, numbness tingling or weakness, changes in her vision or speech. No chest pain, shortness of breath, palpitations. She does report that she has multiple nonhealing ulcers on the tibial aspect of bilateral lower extremities that have been increasingly draining compared to baseline.   Past Medical History:  Diagnosis Date  . Anemia, unspecified   . CHF (congestive heart failure) ([facility])   . Chronic ischemic heart disease, unspecified   . COPD (chronic obstructive pulmonary disease) ([facility])   . Dependence on supplemental oxygen   . Diabetes mellitus without complication ([facility])   . Gout, unspecified   . Hypertension   . Mixed hyperlipidemia   . 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 Obstructive sleep apnea syndrome   . Peripheral venous insufficiency   . Primary generalized hypertrophic osteoarthrosis   . Primary hypothyroidism   . Severe recurrent major depression with psychotic features [facility](HCC)     Patient  Active Problem List   Diagnosis Date Noted  . Thrombocytopenia ([facility]) 09/28/2015  . Anemia in chronic kidney disease 09/28/2015  . Diarrhea 11/28/2014  . AKI (acute kidney injury) ([facility]) 11/28/2014  . COPD (chronic obstructive pulmonary disease) ([facility]) 11/28/2014  . DM (diabetes mellitus) ([facility]) 11/28/2014  . HTN (hypertension) 11/28/2014    Past Surgical History:  Procedure Laterality Date  . TONSILLECTOMY      Current Outpatient Rx  . Order #: [id number] Class: Historical Med  . Order #: [id number] Class: Historical Med  . Order #: [id number] Class: Historical Med  . Order #: [id number] Class: Historical Med  . Order #: [id number] Class: Historical Med  . Order #: [id number] Class: Print  . Order #: [id number] Class: Historical Med  . Order #: [id number] Class: Historical Med  . Order #: [id number] Class: Historical Med  . Order #: [id number] Class: Historical Med  . Order #: [id number] Class: Historical Med  . Order #: [id number] Class: Historical Med  . Order #: [id number] Class: Historical Med  . Order #: [id number] Class: Historical Med  . Order #: [id number] Class: Historical Med  . Order #: [id number] Class: Historical Med  . Order #: [id number] Class: Historical Med  . Order #: [id number] Class: Historical Med  . Order #: [id number] Class: Historical Med  . Order #: [id number] Class: Historical Med  . Order #: [id number] Class: Historical Med  . Order #: [id number] Class: Historical Med  . Order #: [id number] Class: Historical Med  . Order #: [id number] Class: Historical Med    Allergies Review of patient's allergies indicates no known allergies.  Family History  Problem Relation Age of Onset  . Hypertension Mother   . Diabetes Mother   .  CAD Mother   . CAD Father   . Diabetes Father   . Hypertension Father   . Hypertension Sister   . Diabetes Sister   . CAD Sister     Social History Social History  Substance Use Topics  . Smoking status: Former Games developer  . Smokeless tobacco: Never Used  . Alcohol  use No    Review of Systems Constitutional: No fever/chills.Positive altered mental status. Negative lightheadedness or syncope. Negative general malaise. Eyes: No visual changes. No eye discharge. No blurred or double vision. ENT: No sore throat. No congestion or rhinorrhea. Cardiovascular: Denies chest pain. Denies palpitations. Respiratory: Denies shortness of breath.  No cough. Gastrointestinal: No abdominal pain.  No nausea, no vomiting.  No diarrhea.  No constipation. Genitourinary: Positive for dysuria. Negative for frequency or hematuria. Musculoskeletal: Negative for back pain. Skin: Positive for bilateral lower extremity nonhealing wounds with increasing purulent discharge and surrounding erythema greater than baseline. Neurological: Negative for headaches. No focal numbness, tingling or weakness. Positive altered mental status.  10-point ROS otherwise negative.  ____________________________________________   PHYSICAL EXAM:  VITAL SIGNS: ED Triage Vitals  Enc Vitals Group     BP 12/28/15 1801 134/65     Pulse Rate 12/28/15 1801 79     Resp 12/28/15 1801 18     Temp 12/28/15 1801 98.1 F (36.7 C)     Temp Source 12/28/15 1759 Oral     SpO2 12/28/15 1801 100 %     Weight 12/28/15 1800 (!) 373 lb (169.2 kg)     Height --      Head Circumference --      Peak Flow --      Pain Score 12/28/15 1800 3     Pain Loc --      Pain Edu? --      Excl. in GC? --     Constitutional: Alert and oriented. Well appearing and in no acute distress. Answers questions appropriately.GCS is 15. Eyes: Conjunctivae are normal.  EOMI. No scleral icterus. No eye discharge. Head: Atraumatic. Nose: No congestion/rhinnorhea. Mouth/Throat: Mucous membranes are moist.  Neck: No stridor.  Supple.  No meningismus. Cardiovascular: Normal rate, regular rhythm. No murmurs, rubs or gallops.  Respiratory: Normal respiratory effort.  No accessory muscle use or retractions. Lungs CTAB.  No wheezes,  rales or ronchi. Gastrointestinal: Morbidly obese. Soft, nontender and nondistended.  No guarding or rebound.  No peritoneal signs. Musculoskeletal: Bilateral lower extremity edema that is symmetric without calf pain or palpable cords. The patient has 2 nonhealing ulcers, approximately 3 x 3 cm, and 2 x 2 cm, on the anterior left tibia with the superior larger one demonstrating purulent discharge. She also has several scabbed and open ulcers on the right anterior tibia that are smaller in size. Both legs have surrounding erythema and swelling with tenderness to palpation. Normal DP and PT pulses bilaterally. No palpable crepitus. Neurologic:  A&Ox3.  Speech is clear.  Face and smile are symmetric.  EOMI.  Moves all extremities well. Skin:  Abnormal see above for description. Psychiatric: Mood and affect are normal. Speech and behavior are normal.  Normal judgement.  ____________________________________________   LABS (all labs ordered are listed, but only abnormal results are displayed)  Labs Reviewed  COMPREHENSIVE METABOLIC PANEL - Abnormal; Notable for the following:       Result Value   Sodium 133 (*)    Chloride 96 (*)    BUN 32 (*)    Creatinine, Ser 1.21 (*)  Calcium 8.6 (*)    GFR calc non Af Amer 47 (*)    GFR calc Af Amer 55 (*)    All other components within normal limits  CBC - Abnormal; Notable for the following:    RBC 3.47 (*)    Hemoglobin 10.5 (*)    HCT 30.2 (*)    RDW 15.1 (*)    All other components within normal limits  URINALYSIS COMPLETEWITH MICROSCOPIC ([facility] ONLY) - Abnormal; Notable for the following:    Color, Urine STRAW (*)    APPearance CLEAR (*)    Specific Gravity, Urine 1.003 (*)    Hgb urine dipstick 1+ (*)    Protein, ur 100 (*)    Nitrite POSITIVE (*)    Leukocytes, UA TRACE (*)    Bacteria, UA MANY (*)    Squamous Epithelial / LPF 0-5 (*)    All other components within normal limits  BLOOD GAS, VENOUS - Abnormal; Notable for the  following:    pCO2, Ven 43 (*)    pO2, Ven 74.0 (*)    Acid-Base Excess 2.3 (*)    All other components within normal limits  SEDIMENTATION RATE - Abnormal; Notable for the following:    Sed Rate 66 (*)    All other components within normal limits  GLUCOSE, CAPILLARY  C-REACTIVE PROTEIN  CBG MONITORING, ED   ____________________________________________  EKG  ED ECG REPORT I, Rockne MenghiniNorman, Anne-Caroline, the attending physician, personally viewed and interpreted this ECG.   Date: 12/28/2015  EKG Time: 1002  Rate: 77  Rhythm: The EKG states that it is atrial fibrillation but in fact this is sinus with some arrhythmia.  Axis: normal  Intervals:none  ST&T Change: No STEMI  ____________________________________________  RADIOLOGY  Dg Tibia/fibula Left  Result Date: 12/28/2015 CLINICAL DATA:  Bilateral lower lower leg ulcerations, acute onset. Initial encounter. EXAM: LEFT TIBIA AND FIBULA - 2 VIEW COMPARISON:  None. FINDINGS: There is chronic deformity about the distal femur and proximal tibia, likely reflecting remote injury. Some degree of bony remodeling is noted. No definite acute osseous erosions are identified. Skin thickening is noted along the anterior aspect of the lower leg, with scattered soft tissue calcifications. Mild degenerative change is noted at the talar dome. IMPRESSION: 1. Chronic deformity about the distal femur and proximal tibia. No acute osseous erosions seen. 2. Skin thickening along the anterior aspect of the lower leg, with scattered soft tissue calcifications. Electronically Signed   By: Roanna RaiderJeffery  Chang M.D.   On: 12/28/2015 21:06   Dg Tibia/fibula Right  Result Date: 12/28/2015 CLINICAL DATA:  Bilateral lower leg ulcerations, acute onset. Initial encounter. EXAM: RIGHT TIBIA AND FIBULA - 2 VIEW COMPARISON:  None. FINDINGS: There is no evidence of osseous disruption. No osseous erosions are identified. Small marginal osteophytes are noted arising at the medial and  lateral compartments. The ankle mortise is incompletely assessed, but grossly unremarkable in appearance. Scattered soft tissue calcifications are seen. A small posterior calcaneal spur is seen. IMPRESSION: No osseous erosions seen. No evidence of fracture. Scattered soft tissue calcifications seen. Electronically Signed   By: Roanna RaiderJeffery  Chang M.D.   On: 12/28/2015 21:03    ____________________________________________   PROCEDURES  Procedure(s) performed: None  Procedures  Critical Care performed: No ____________________________________________   INITIAL IMPRESSION / ASSESSMENT AND PLAN / ED COURSE  Pertinent labs & imaging results that were available during my care of the patient were reviewed by me and considered in my medical decision making (see chart for  details).  60 y.o. female with multiple comorbidities presenting with an episode of altered mental status. At this time, the patient is hemodynamically stable and has regained normal mental status pitches no other neurologic abnormalities on my examination. We will check her for UTI, and I am concerned that she has nonhealing infected leg ulcers with surrounding cellulitis. Will get a CT of the brain, and basic labs. Patient has a 30 stated that she is unwilling to stay for hospitalization, regardless of findings.  ----------------------------------------- 10:58 PM on 12/28/2015 -----------------------------------------  The patient's workup is positive for urinary tract infection as well as cellulitis with nonhealing wounds but no evidence of bony involvement or osteomyelitis. I have given the patient vancomycin and Zosyn intravenously. She is unwilling to stay for continued IV antibiotics and monitoring, and will be leaving AGAINST MEDICAL ADVICE. I had a long discussion with the patient about the risks of leaving, including sepsis, loss of the bilateral lower extremities, pyelonephritis, or even death. She is adamant that she needs  to go home to her dog. She promises that she will return if she worsens. She understands return precautions as well as follow-up instructions. I sent her home with Keflex which should cover both infections and a urine culture has been sent.  ____________________________________________  FINAL CLINICAL IMPRESSION(S) / ED DIAGNOSES  Final diagnoses:  Nonhealing nonsurgical wound limited to breakdown of skin  Cellulitis of lower leg  Acute cystitis without hematuria  Disorientation    Clinical Course      NEW MEDICATIONS STARTED DURING THIS VISIT:  New Prescriptions   CEPHALEXIN (KEFLEX) 500 MG CAPSULE    Take 1 capsule (500 mg total) by mouth 4 (four) times daily.      Rockne Menghini, MD 12/28/15 2303

## 2015-12-28 NOTE — ED Notes (Signed)
Pt refusing cathter and states she is unable to give UA

## 2015-12-28 NOTE — ED Notes (Signed)
Pt states that she does not have a ride to come pick her up and that she is wheelchair bound at home so she is unable to drive herself.  Pt requesting EMS to take her home.  Pt educated that this RN cannot guarantee a time frame of EMS getting here.  Pt verbalized understanding.

## 2015-12-28 NOTE — ED Triage Notes (Signed)
Pt to ED via EMS from home with c/o AMS today, per social worker and home health aid who have been helping pt for 6880yrs , pt did not recognize them today. Per EMS pt is poor historian with no physical complaints. Pt A&O x4 denies knowing a reason she is here. Per EMS  VS 150/70, 75,98% on 4L chronic, glucose 100.

## 2015-12-28 NOTE — ED Notes (Signed)
Robert [employer], Patient's CAP worker through [employer] by [employer] (home health), needs to be informed about discharge information seeing how he will be the help for transportation.  He can be reached at [phone]

## 2015-12-29 NOTE — ED Notes (Signed)
Pt discharged to home.  Discharge instructions reviewed.  Verbalized understanding.  No questions or concerns at this time.  Teach back verified.  Pt in NAD.  No items left in ED.   

## 2015-12-29 NOTE — ED Notes (Signed)
Pt refusing discharge vitals.  Pt alert and oriented with no acute distress noted at this time.  EMS arrived to pick up patient.

## 2016-01-08 NOTE — [place] Notes (Deleted)
[place]  [phone] Fax:(336) [phone]  ID: Deanna Rice OB: 07-Apr-1955  MR#: [id number]  SEG#:[id number]  Patient Care Team: Lavera Guise, MD as PCP - General (Internal Medicine)  CHIEF COMPLAINT: Anemia in chronic kidney disease and thrombocytopenia  INTERVAL HISTORY: Patient returns to clinic today for repeat laboratory work and further evaluation. She currently feels well and is at her baseline. She has chronic weakness and shortness of breath. She has no neurologic complaints. She denies any fevers. She denies any chest pain. She denies any nausea or vomiting. She has no melena or hematochezia. She has no urinary complaints. Patient offers no further specific complaints today.  REVIEW OF SYSTEMS:   Review of Systems  Constitutional: Positive for malaise/fatigue. Negative for fever and weight loss.  Respiratory: Positive for shortness of breath. Negative for cough.   Cardiovascular: Negative.  Negative for chest pain.  Gastrointestinal: Negative.  Negative for abdominal pain, blood in stool and melena.  Genitourinary: Negative.   Musculoskeletal: Negative.   Neurological: Positive for weakness.  Psychiatric/Behavioral: Negative.     As per HPI. Otherwise, a complete review of systems is negatve.  PAST MEDICAL HISTORY: Past Medical History:  Diagnosis Date  . Anemia, unspecified   . CHF (congestive heart failure) ([place])   . Chronic ischemic heart disease, unspecified   . COPD (chronic obstructive pulmonary disease) ([place])   . Dependence on supplemental oxygen   . Diabetes mellitus without complication ([place])   . Gout, unspecified   . Hypertension   . Mixed hyperlipidemia   . Obstructive chronic bronchitis without exacerbation ([place])   . Obstructive sleep apnea syndrome   . Peripheral venous insufficiency   . Primary generalized hypertrophic osteoarthrosis   . Primary hypothyroidism   . Severe recurrent major depression with psychotic  features ([place])     PAST SURGICAL HISTORY: Past Surgical History:  Procedure Laterality Date  . TONSILLECTOMY      FAMILY HISTORY Family History  Problem Relation Age of Onset  . Hypertension Mother   . Diabetes Mother   . CAD Mother   . CAD Father   . Diabetes Father   . Hypertension Father   . Hypertension Sister   . Diabetes Sister   . CAD Sister        ADVANCED DIRECTIVES:    HEALTH MAINTENANCE: Social History  Substance Use Topics  . Smoking status: Former Research scientist (life sciences)  . Smokeless tobacco: Never Used  . Alcohol use No     Colonoscopy:  PAP:  Bone density:  Lipid panel:  No Known Allergies  Current Outpatient Prescriptions  Medication Sig Dispense Refill  . allopurinol (ZYLOPRIM) 100 MG tablet Take 100 mg by mouth daily.    Marland Kitchen amitriptyline (ELAVIL) 50 MG tablet Take 25 mg by mouth at bedtime.     Marland Kitchen aspirin EC 325 MG tablet Take 325 mg by mouth daily.    . benazepril (LOTENSIN) 10 MG tablet Take 10 mg by mouth daily. Reported on 06/28/2015    . calcium-vitamin D (OSCAL WITH D) 250-125 MG-UNIT tablet Take 2 tablets by mouth daily.    . cephALEXin (KEFLEX) 500 MG capsule Take 1 capsule (500 mg total) by mouth 4 (four) times daily. 56 capsule 0  . clopidogrel (PLAVIX) 75 MG tablet Take 75 mg by mouth daily. Reported on 06/28/2015    . docusate sodium (COLACE) 100 MG capsule Take 50 mg by mouth 2 (two) times daily. Reported on 06/28/2015    . DULoxetine (CYMBALTA)  60 MG capsule Take 60 mg by mouth 2 (two) times daily.    . ferrous sulfate 325 (65 FE) MG tablet Take 325 mg by mouth daily with breakfast. Reported on 06/28/2015    . Fluticasone-Salmeterol (ADVAIR) 250-50 MCG/DOSE AEPB Inhale 1 puff into the lungs 2 (two) times daily.    . folic acid (FOLVITE) 673 MCG tablet Take 400 mcg by mouth daily. Reported on 06/28/2015    . Insulin Aspart (NOVOLOG FLEXPEN [place]) Inject into the skin. Reported on 06/28/2015    . Ipratropium-Albuterol (COMBIVENT RESPIMAT) 20-100 MCG/ACT AERS  respimat Inhale 1 puff into the lungs every 6 (six) hours.    . isosorbide mononitrate (IMDUR) 60 MG 24 hr tablet Take 60 mg by mouth 2 (two) times daily.    . Liraglutide (VICTOZA [place]) Inject 1.8 mg into the skin every morning.    . loratadine (CLARITIN) 10 MG tablet Take 10 mg by mouth daily.    . metoprolol succinate (TOPROL-XL) 50 MG 24 hr tablet Take 50 mg by mouth daily. Take with or immediately following a meal.    . nitroGLYCERIN (NITROLINGUAL) 0.4 MG/SPRAY spray Place 1 spray under the tongue every 5 (five) minutes x 3 doses as needed for chest pain.    Marland Kitchen nystatin (MYCOSTATIN/NYSTOP) 100000 UNIT/GM POWD Apply topically 6 (six) times daily.    Marland Kitchen omeprazole (PRILOSEC) 20 MG capsule Take 20 mg by mouth 2 (two) times daily before a meal. Reported on 06/28/2015    . potassium chloride (K-DUR,KLOR-CON) 10 MEQ tablet Take 10 mEq by mouth 2 (two) times a week. Reported on 06/28/2015    . simvastatin (ZOCOR) 80 MG tablet Take 80 mg by mouth daily at 6 PM.    . traMADol (ULTRAM) 50 MG tablet Take 50 mg by mouth 3 (three) times daily as needed.     No current facility-administered medications for this visit.     OBJECTIVE: There were no vitals filed for this visit.   There is no height or weight on file to calculate BMI.    ECOG FS:0 - Asymptomatic  General:  Obese, sitting in a wheelchair. no acute distress. Eyes: Pink conjunctiva, anicteric sclera. Lungs: Clear to auscultation bilaterally. Heart: Regular rate and rhythm. No rubs, murmurs, or gallops. Abdomen: Soft, nontender, nondistended. No organomegaly noted, normoactive bowel sounds. Musculoskeletal: No edema, cyanosis, or clubbing. Neuro: Alert, answering all questions appropriately. Cranial nerves grossly intact. Skin: No rashes or petechiae noted. Psych: Normal affect.   LAB RESULTS:  Lab Results  Component Value Date   NA 133 (L) 12/28/2015   K 3.9 12/28/2015   CL 96 (L) 12/28/2015   CO2 28 12/28/2015   GLUCOSE 85 12/28/2015     BUN 32 (H) 12/28/2015   CREATININE 1.21 (H) 12/28/2015   CALCIUM 8.6 (L) 12/28/2015   PROT 7.2 12/28/2015   ALBUMIN 4.0 12/28/2015   AST 21 12/28/2015   ALT 15 12/28/2015   ALKPHOS 45 12/28/2015   BILITOT 0.6 12/28/2015   GFRNONAA 47 (L) 12/28/2015   GFRAA 55 (L) 12/28/2015    Lab Results  Component Value Date   WBC 9.1 12/28/2015   NEUTROABS 5.3 11/29/2015   HGB 10.5 (L) 12/28/2015   HCT 30.2 (L) 12/28/2015   MCV 87.1 12/28/2015   PLT 152 12/28/2015   Lab Results  Component Value Date   IRON 36 12/22/2014   TIBC 276 12/22/2014   IRONPCTSAT 13 12/22/2014    Lab Results  Component Value Date   FERRITIN 100  12/22/2014     STUDIES: Dg Tibia/fibula Left  Result Date: 12/28/2015 CLINICAL DATA:  Bilateral lower lower leg ulcerations, acute onset. Initial encounter. EXAM: LEFT TIBIA AND FIBULA - 2 VIEW COMPARISON:  None. FINDINGS: There is chronic deformity about the distal femur and proximal tibia, likely reflecting remote injury. Some degree of bony remodeling is noted. No definite acute osseous erosions are identified. Skin thickening is noted along the anterior aspect of the lower leg, with scattered soft tissue calcifications. Mild degenerative change is noted at the talar dome. IMPRESSION: 1. Chronic deformity about the distal femur and proximal tibia. No acute osseous erosions seen. 2. Skin thickening along the anterior aspect of the lower leg, with scattered soft tissue calcifications. Electronically Signed   By: Garald Balding M.D.   On: 12/28/2015 21:06   Dg Tibia/fibula Right  Result Date: 12/28/2015 CLINICAL DATA:  Bilateral lower leg ulcerations, acute onset. Initial encounter. EXAM: RIGHT TIBIA AND FIBULA - 2 VIEW COMPARISON:  None. FINDINGS: There is no evidence of osseous disruption. No osseous erosions are identified. Small marginal osteophytes are noted arising at the medial and lateral compartments. The ankle mortise is incompletely assessed, but grossly  unremarkable in appearance. Scattered soft tissue calcifications are seen. A small posterior calcaneal spur is seen. IMPRESSION: No osseous erosions seen. No evidence of fracture. Scattered soft tissue calcifications seen. Electronically Signed   By: Garald Balding M.D.   On: 12/28/2015 21:03    ASSESSMENT: Anemia in chronic kidney disease and thrombocytopenia:  PLAN:    1. Anemia in chronic kidney disease: Patient's hemoglobin is decreased, but stable at 9.8. Previously, all of her labwork including iron stores, B12, folate, hemolysis labs, and ANA were within normal limits. Her erythropoietin level is appropriately elevated.  No intervention is needed at this time. Return to clinic in 6 months for laboratory work only and then in 3 months for further evaluation.  2. Thrombocytopenia: Patient's platelet count has significantly declined is now 71. Previously, all of her laboratory work was either negative or within normal limits. Follow-up as above. Consider bone marrow biopsy in the future if no improvement in her blood counts. 3. Chronic renal insufficiency: Mild, although patient has an elevated erythropoietin level she may benefit from Procrit in the future.  Patient expressed understanding and was in agreement with this plan. She also understands that She can call clinic at any time with any questions, concerns, or complaints.   Lloyd Huger, MD   01/08/2016 11:15 PM

## 2016-01-09 ENCOUNTER — Inpatient Hospital Stay: Payer: Medicaid Other | Admitting: Oncology

## 2016-01-09 ENCOUNTER — Inpatient Hospital Stay: Payer: Medicaid Other

## 2016-01-23 NOTE — [facility] Notes (Signed)
Review of Systems -

## 2016-01-24 ENCOUNTER — Inpatient Hospital Stay: Payer: Medicaid Other | Admitting: Oncology

## 2016-01-24 ENCOUNTER — Inpatient Hospital Stay: Payer: Medicaid Other | Attending: Oncology

## 2016-01-24 VITALS — BP 122/63 | HR 77 | Temp 98.2°F | Resp 18 | Wt 348.5 lb

## 2016-01-24 DIAGNOSIS — N189 Chronic kidney disease, unspecified: Secondary | ICD-10-CM | POA: Insufficient documentation

## 2016-01-24 DIAGNOSIS — I130 Hypertensive heart and chronic kidney disease with heart failure and stage 1 through stage 4 chronic kidney disease, or unspecified chronic kidney disease: Secondary | ICD-10-CM | POA: Insufficient documentation

## 2016-01-24 DIAGNOSIS — E1122 Type 2 diabetes mellitus with diabetic chronic kidney disease: Secondary | ICD-10-CM | POA: Diagnosis not present

## 2016-01-24 DIAGNOSIS — D696 Thrombocytopenia, unspecified: Secondary | ICD-10-CM | POA: Diagnosis not present

## 2016-01-24 LAB — CBC WITH DIFFERENTIAL/PLATELET
BASOS ABS: 0 10*3/uL (ref 0–0.1)
Basophils Relative: 0 %
EOS PCT: 1 %
Eosinophils Absolute: 0.1 10*3/uL (ref 0–0.7)
HCT: 29.5 % — ABNORMAL LOW (ref 35.0–47.0)
Hemoglobin: 10.3 g/dL — ABNORMAL LOW (ref 12.0–16.0)
LYMPHS PCT: 21 %
Lymphs Abs: 1.6 10*3/uL (ref 1.0–3.6)
MCH: 29.8 pg (ref 26.0–34.0)
MCHC: 34.9 g/dL (ref 32.0–36.0)
MCV: 85.3 fL (ref 80.0–100.0)
MONO ABS: 0.3 10*3/uL (ref 0.2–0.9)
Monocytes Relative: 4 %
Neutro Abs: 5.7 10*3/uL (ref 1.4–6.5)
Neutrophils Relative %: 74 %
PLATELETS: 123 10*3/uL — AB (ref 150–440)
RBC: 3.45 MIL/uL — ABNORMAL LOW (ref 3.80–5.20)
RDW: 15.2 % — AB (ref 11.5–14.5)
WBC: 7.7 10*3/uL (ref 3.6–11.0)

## 2016-01-24 NOTE — [facility] Notes (Signed)
Offers no complaints. States is feeling well. 

## 2016-01-29 ENCOUNTER — Inpatient Hospital Stay: Payer: Medicaid Other | Admitting: Oncology

## 2016-01-29 NOTE — [facility] Notes (Deleted)
[place]  [phone]) [phone] Fax:(336) [phone]  ID: Deanna Rice OB: [birth year]  MR#: [id number]  GBE#:[id number]  Patient Care Team: Lavera Guise, MD as PCP - General (Internal Medicine)  CHIEF COMPLAINT: Anemia in chronic kidney disease and thrombocytopenia  INTERVAL HISTORY: Patient returns to clinic today for repeat laboratory work and further evaluation. She currently feels well and is at her baseline. She has chronic weakness and shortness of breath. She has no neurologic complaints. She denies any fevers. She denies any chest pain. She denies any nausea or vomiting. She has no melena or hematochezia. She has no urinary complaints. Patient offers no further specific complaints today.  REVIEW OF SYSTEMS:   Review of Systems  Constitutional: Positive for malaise/fatigue. Negative for fever and weight loss.  Respiratory: Positive for shortness of breath. Negative for cough.   Cardiovascular: Negative.  Negative for chest pain.  Gastrointestinal: Negative.  Negative for abdominal pain, blood in stool and melena.  Genitourinary: Negative.   Musculoskeletal: Negative.   Neurological: Positive for weakness.  Psychiatric/Behavioral: Negative.     As per HPI. Otherwise, a complete review of systems is negatve.  PAST MEDICAL HISTORY: Past Medical History:  Diagnosis Date  . Anemia, unspecified   . CHF (congestive heart failure) ([place])   . Chronic ischemic heart disease, unspecified   . COPD (chronic obstructive pulmonary disease) ([place])   . Dependence on supplemental oxygen   . Diabetes mellitus without complication ([place])   . Gout, unspecified   . Hypertension   . Mixed hyperlipidemia   . Obstructive chronic bronchitis without exacerbation ([place])   . Obstructive sleep apnea syndrome   . Peripheral venous insufficiency   . Primary generalized hypertrophic osteoarthrosis   . Primary hypothyroidism   . Severe recurrent major depression with psychotic  features ([place])     PAST SURGICAL HISTORY: Past Surgical History:  Procedure Laterality Date  . TONSILLECTOMY      FAMILY HISTORY Family History  Problem Relation Age of Onset  . Hypertension Mother   . Diabetes Mother   . CAD Mother   . CAD Father   . Diabetes Father   . Hypertension Father   . Hypertension Sister   . Diabetes Sister   . CAD Sister        ADVANCED DIRECTIVES:    HEALTH MAINTENANCE: Social History  Substance Use Topics  . Smoking status: Former Research scientist (life sciences)  . Smokeless tobacco: Never Used  . Alcohol use No     Colonoscopy:  PAP:  Bone density:  Lipid panel:  No Known Allergies  Current Outpatient Prescriptions  Medication Sig Dispense Refill  . allopurinol (ZYLOPRIM) 100 MG tablet Take 100 mg by mouth daily.    Marland Kitchen amitriptyline (ELAVIL) 50 MG tablet Take 25 mg by mouth at bedtime.     Marland Kitchen aspirin EC 325 MG tablet Take 325 mg by mouth daily.    . benazepril (LOTENSIN) 10 MG tablet Take 10 mg by mouth daily. Reported on 06/28/2015    . calcium-vitamin D (OSCAL WITH D) 250-125 MG-UNIT tablet Take 2 tablets by mouth daily.    . clopidogrel (PLAVIX) 75 MG tablet Take 75 mg by mouth daily. Reported on 06/28/2015    . docusate sodium (COLACE) 100 MG capsule Take 50 mg by mouth 2 (two) times daily. Reported on 06/28/2015    . DULoxetine (CYMBALTA) 60 MG capsule Take 60 mg by mouth 2 (two) times daily.    . ferrous sulfate 325 (65 FE) MG  tablet Take 325 mg by mouth daily with breakfast. Reported on 06/28/2015    . Fluticasone-Salmeterol (ADVAIR) 250-50 MCG/DOSE AEPB Inhale 1 puff into the lungs 2 (two) times daily.    . folic acid (FOLVITE) 242 MCG tablet Take 400 mcg by mouth daily. Reported on 06/28/2015    . Insulin Aspart (NOVOLOG FLEXPEN [place]) Inject into the skin. Reported on 06/28/2015    . Ipratropium-Albuterol (COMBIVENT RESPIMAT) 20-100 MCG/ACT AERS respimat Inhale 1 puff into the lungs every 6 (six) hours.    . isosorbide mononitrate (IMDUR) 60 MG 24 hr tablet  Take 60 mg by mouth 2 (two) times daily.    . Liraglutide (VICTOZA [place]) Inject 1.8 mg into the skin every morning.    . loratadine (CLARITIN) 10 MG tablet Take 10 mg by mouth daily.    . metoprolol succinate (TOPROL-XL) 50 MG 24 hr tablet Take 50 mg by mouth daily. Take with or immediately following a meal.    . nitroGLYCERIN (NITROLINGUAL) 0.4 MG/SPRAY spray Place 1 spray under the tongue every 5 (five) minutes x 3 doses as needed for chest pain.    Marland Kitchen nystatin (MYCOSTATIN/NYSTOP) 100000 UNIT/GM POWD Apply topically 6 (six) times daily.    Marland Kitchen omeprazole (PRILOSEC) 20 MG capsule Take 20 mg by mouth 2 (two) times daily before a meal. Reported on 06/28/2015    . potassium chloride (K-DUR,KLOR-CON) 10 MEQ tablet Take 10 mEq by mouth 2 (two) times a week. Reported on 06/28/2015    . simvastatin (ZOCOR) 80 MG tablet Take 80 mg by mouth daily at 6 PM.    . traMADol (ULTRAM) 50 MG tablet Take 50 mg by mouth 3 (three) times daily as needed.     No current facility-administered medications for this visit.     OBJECTIVE: There were no vitals filed for this visit.   There is no height or weight on file to calculate BMI.    ECOG FS:0 - Asymptomatic  General:  Obese, sitting in a wheelchair. no acute distress. Eyes: Pink conjunctiva, anicteric sclera. Lungs: Clear to auscultation bilaterally. Heart: Regular rate and rhythm. No rubs, murmurs, or gallops. Abdomen: Soft, nontender, nondistended. No organomegaly noted, normoactive bowel sounds. Musculoskeletal: No edema, cyanosis, or clubbing. Neuro: Alert, answering all questions appropriately. Cranial nerves grossly intact. Skin: No rashes or petechiae noted. Psych: Normal affect.   LAB RESULTS:  Lab Results  Component Value Date   NA 133 (L) 12/28/2015   K 3.9 12/28/2015   CL 96 (L) 12/28/2015   CO2 28 12/28/2015   GLUCOSE 85 12/28/2015   BUN 32 (H) 12/28/2015   CREATININE 1.21 (H) 12/28/2015   CALCIUM 8.6 (L) 12/28/2015   PROT 7.2 12/28/2015    ALBUMIN 4.0 12/28/2015   AST 21 12/28/2015   ALT 15 12/28/2015   ALKPHOS 45 12/28/2015   BILITOT 0.6 12/28/2015   GFRNONAA 47 (L) 12/28/2015   GFRAA 55 (L) 12/28/2015    Lab Results  Component Value Date   WBC 7.7 01/24/2016   NEUTROABS 5.7 01/24/2016   HGB 10.3 (L) 01/24/2016   HCT 29.5 (L) 01/24/2016   MCV 85.3 01/24/2016   PLT 123 (L) 01/24/2016   Lab Results  Component Value Date   IRON 36 12/22/2014   TIBC 276 12/22/2014   IRONPCTSAT 13 12/22/2014    Lab Results  Component Value Date   FERRITIN 100 12/22/2014     STUDIES: No results found.  ASSESSMENT: Anemia in chronic kidney disease and thrombocytopenia:  PLAN:  1. Anemia in chronic kidney disease: Patient's hemoglobin is decreased, but stable at 9.8. Previously, all of her labwork including iron stores, B12, folate, hemolysis labs, and ANA were within normal limits. Her erythropoietin level is appropriately elevated.  No intervention is needed at this time. Return to clinic in 6 months for laboratory work only and then in 3 months for further evaluation.  2. Thrombocytopenia: Patient's platelet count has significantly declined is now 71. Previously, all of her laboratory work was either negative or within normal limits. Follow-up as above. Consider bone marrow biopsy in the future if no improvement in her blood counts. 3. Chronic renal insufficiency: Mild, although patient has an elevated erythropoietin level she may benefit from Procrit in the future.  Patient expressed understanding and was in agreement with this plan. She also understands that She can call clinic at any time with any questions, concerns, or complaints.   Lloyd Huger, MD   01/29/2016 8:50 AM

## 2016-02-12 NOTE — Progress Notes (Deleted)
[place]  [phone]) [phone] Fax:(336) [phone]  ID: Deanna Rice OB: Sep 20, 1955  MR#: [id number]  LPF#:[id number]  Patient Care Team: Lavera Guise, MD as PCP - General (Internal Medicine)  CHIEF COMPLAINT: Anemia in chronic kidney disease and thrombocytopenia  INTERVAL HISTORY: Patient returns to clinic today for repeat laboratory work and further evaluation. She currently feels well and is at her baseline. She has chronic weakness and shortness of breath. She has no neurologic complaints. She denies any fevers. She denies any chest pain. She denies any nausea or vomiting. She has no melena or hematochezia. She has no urinary complaints. Patient offers no further specific complaints today.  REVIEW OF SYSTEMS:   Review of Systems  Constitutional: Positive for malaise/fatigue. Negative for fever and weight loss.  Respiratory: Positive for shortness of breath. Negative for cough.   Cardiovascular: Negative.  Negative for chest pain.  Gastrointestinal: Negative.  Negative for abdominal pain, blood in stool and melena.  Genitourinary: Negative.   Musculoskeletal: Negative.   Neurological: Positive for weakness.  Psychiatric/Behavioral: Negative.     As per HPI. Otherwise, a complete review of systems is negatve.  PAST MEDICAL HISTORY: Past Medical History:  Diagnosis Date  . Anemia, unspecified   . CHF (congestive heart failure) ([place])   . Chronic ischemic heart disease, unspecified   . COPD (chronic obstructive pulmonary disease) ([place])   . Dependence on supplemental oxygen   . Diabetes mellitus without complication ([place])   . Gout, unspecified   . Hypertension   . Mixed hyperlipidemia   . Obstructive chronic bronchitis without exacerbation ([place])   . Obstructive sleep apnea syndrome   . Peripheral venous insufficiency   . Primary generalized hypertrophic osteoarthrosis   . Primary hypothyroidism   . Severe recurrent major depression with psychotic  features ([place])     PAST SURGICAL HISTORY: Past Surgical History:  Procedure Laterality Date  . TONSILLECTOMY      FAMILY HISTORY Family History  Problem Relation Age of Onset  . Hypertension Mother   . Diabetes Mother   . CAD Mother   . CAD Father   . Diabetes Father   . Hypertension Father   . Hypertension Sister   . Diabetes Sister   . CAD Sister        ADVANCED DIRECTIVES:    HEALTH MAINTENANCE: Social History  Substance Use Topics  . Smoking status: Former Research scientist (life sciences)  . Smokeless tobacco: Never Used  . Alcohol use No     Colonoscopy:  PAP:  Bone density:  Lipid panel:  No Known Allergies  Current Outpatient Prescriptions  Medication Sig Dispense Refill  . allopurinol (ZYLOPRIM) 100 MG tablet Take 100 mg by mouth daily.    Marland Kitchen amitriptyline (ELAVIL) 50 MG tablet Take 25 mg by mouth at bedtime.     Marland Kitchen aspirin EC 325 MG tablet Take 325 mg by mouth daily.    . benazepril (LOTENSIN) 10 MG tablet Take 10 mg by mouth daily. Reported on 06/28/2015    . calcium-vitamin D (OSCAL WITH D) 250-125 MG-UNIT tablet Take 2 tablets by mouth daily.    . clopidogrel (PLAVIX) 75 MG tablet Take 75 mg by mouth daily. Reported on 06/28/2015    . docusate sodium (COLACE) 100 MG capsule Take 50 mg by mouth 2 (two) times daily. Reported on 06/28/2015    . DULoxetine (CYMBALTA) 60 MG capsule Take 60 mg by mouth 2 (two) times daily.    . ferrous sulfate 325 (65 FE) MG  tablet Take 325 mg by mouth daily with breakfast. Reported on 06/28/2015    . Fluticasone-Salmeterol (ADVAIR) 250-50 MCG/DOSE AEPB Inhale 1 puff into the lungs 2 (two) times daily.    . folic acid (FOLVITE) 242 MCG tablet Take 400 mcg by mouth daily. Reported on 06/28/2015    . Insulin Aspart (NOVOLOG FLEXPEN [place]) Inject into the skin. Reported on 06/28/2015    . Ipratropium-Albuterol (COMBIVENT RESPIMAT) 20-100 MCG/ACT AERS respimat Inhale 1 puff into the lungs every 6 (six) hours.    . isosorbide mononitrate (IMDUR) 60 MG 24 hr tablet  Take 60 mg by mouth 2 (two) times daily.    . Liraglutide (VICTOZA [place]) Inject 1.8 mg into the skin every morning.    . loratadine (CLARITIN) 10 MG tablet Take 10 mg by mouth daily.    . metoprolol succinate (TOPROL-XL) 50 MG 24 hr tablet Take 50 mg by mouth daily. Take with or immediately following a meal.    . nitroGLYCERIN (NITROLINGUAL) 0.4 MG/SPRAY spray Place 1 spray under the tongue every 5 (five) minutes x 3 doses as needed for chest pain.    Marland Kitchen nystatin (MYCOSTATIN/NYSTOP) 100000 UNIT/GM POWD Apply topically 6 (six) times daily.    Marland Kitchen omeprazole (PRILOSEC) 20 MG capsule Take 20 mg by mouth 2 (two) times daily before a meal. Reported on 06/28/2015    . potassium chloride (K-DUR,KLOR-CON) 10 MEQ tablet Take 10 mEq by mouth 2 (two) times a week. Reported on 06/28/2015    . simvastatin (ZOCOR) 80 MG tablet Take 80 mg by mouth daily at 6 PM.    . traMADol (ULTRAM) 50 MG tablet Take 50 mg by mouth 3 (three) times daily as needed.     No current facility-administered medications for this visit.     OBJECTIVE: There were no vitals filed for this visit.   There is no height or weight on file to calculate BMI.    ECOG FS:0 - Asymptomatic  General:  Obese, sitting in a wheelchair. no acute distress. Eyes: Pink conjunctiva, anicteric sclera. Lungs: Clear to auscultation bilaterally. Heart: Regular rate and rhythm. No rubs, murmurs, or gallops. Abdomen: Soft, nontender, nondistended. No organomegaly noted, normoactive bowel sounds. Musculoskeletal: No edema, cyanosis, or clubbing. Neuro: Alert, answering all questions appropriately. Cranial nerves grossly intact. Skin: No rashes or petechiae noted. Psych: Normal affect.   LAB RESULTS:  Lab Results  Component Value Date   NA 133 (L) 12/28/2015   K 3.9 12/28/2015   CL 96 (L) 12/28/2015   CO2 28 12/28/2015   GLUCOSE 85 12/28/2015   BUN 32 (H) 12/28/2015   CREATININE 1.21 (H) 12/28/2015   CALCIUM 8.6 (L) 12/28/2015   PROT 7.2 12/28/2015    ALBUMIN 4.0 12/28/2015   AST 21 12/28/2015   ALT 15 12/28/2015   ALKPHOS 45 12/28/2015   BILITOT 0.6 12/28/2015   GFRNONAA 47 (L) 12/28/2015   GFRAA 55 (L) 12/28/2015    Lab Results  Component Value Date   WBC 7.7 01/24/2016   NEUTROABS 5.7 01/24/2016   HGB 10.3 (L) 01/24/2016   HCT 29.5 (L) 01/24/2016   MCV 85.3 01/24/2016   PLT 123 (L) 01/24/2016   Lab Results  Component Value Date   IRON 36 12/22/2014   TIBC 276 12/22/2014   IRONPCTSAT 13 12/22/2014    Lab Results  Component Value Date   FERRITIN 100 12/22/2014     STUDIES: No results found.  ASSESSMENT: Anemia in chronic kidney disease and thrombocytopenia:  PLAN:  1. Anemia in chronic kidney disease: Patient's hemoglobin is decreased, but stable at 9.8. Previously, all of her labwork including iron stores, B12, folate, hemolysis labs, and ANA were within normal limits. Her erythropoietin level is appropriately elevated.  No intervention is needed at this time. Return to clinic in 6 months for laboratory work only and then in 3 months for further evaluation.  2. Thrombocytopenia: Patient's platelet count has significantly declined is now 71. Previously, all of her laboratory work was either negative or within normal limits. Follow-up as above. Consider bone marrow biopsy in the future if no improvement in her blood counts. 3. Chronic renal insufficiency: Mild, although patient has an elevated erythropoietin level she may benefit from Procrit in the future.  Patient expressed understanding and was in agreement with this plan. She also understands that She can call clinic at any time with any questions, concerns, or complaints.   Lloyd Huger, MD   02/12/2016 10:19 PM

## 2016-02-14 ENCOUNTER — Inpatient Hospital Stay: Payer: Medicaid Other | Admitting: Oncology

## 2016-03-21 ENCOUNTER — Ambulatory Visit: Payer: Medicaid Other | Admitting: Oncology

## 2016-04-15 NOTE — Progress Notes (Deleted)
[place]  [phone]) [phone] Fax:(336) [phone]  ID: Deanna Rice OB: [birth year]  MR#: [id number]  GXQ#:[id number]  Patient Care Team: Lavera Guise, MD as PCP - General (Internal Medicine)  CHIEF COMPLAINT: Anemia in chronic kidney disease and thrombocytopenia  INTERVAL HISTORY: Patient returns to clinic today for repeat laboratory work and further evaluation. She currently feels well and is at her baseline. She has chronic weakness and shortness of breath. She has no neurologic complaints. She denies any fevers. She denies any chest pain. She denies any nausea or vomiting. She has no melena or hematochezia. She has no urinary complaints. Patient offers no further specific complaints today.  REVIEW OF SYSTEMS:   Review of Systems  Constitutional: Positive for malaise/fatigue. Negative for fever and weight loss.  Respiratory: Positive for shortness of breath. Negative for cough.   Cardiovascular: Negative.  Negative for chest pain.  Gastrointestinal: Negative.  Negative for abdominal pain, blood in stool and melena.  Genitourinary: Negative.   Musculoskeletal: Negative.   Neurological: Positive for weakness.  Psychiatric/Behavioral: Negative.     As per HPI. Otherwise, a complete review of systems is negatve.  PAST MEDICAL HISTORY: Past Medical History:  Diagnosis Date  . Anemia, unspecified   . CHF (congestive heart failure) ([place])   . Chronic ischemic heart disease, unspecified   . COPD (chronic obstructive pulmonary disease) ([place])   . Dependence on supplemental oxygen   . Diabetes mellitus without complication ([place])   . Gout, unspecified   . Hypertension   . Mixed hyperlipidemia   . Obstructive chronic bronchitis without exacerbation ([place])   . Obstructive sleep apnea syndrome   . Peripheral venous insufficiency   . Primary generalized hypertrophic osteoarthrosis   . Primary hypothyroidism   . Severe recurrent major depression with psychotic  features ([place])     PAST SURGICAL HISTORY: Past Surgical History:  Procedure Laterality Date  . TONSILLECTOMY      FAMILY HISTORY Family History  Problem Relation Age of Onset  . Hypertension Mother   . Diabetes Mother   . CAD Mother   . CAD Father   . Diabetes Father   . Hypertension Father   . Hypertension Sister   . Diabetes Sister   . CAD Sister        ADVANCED DIRECTIVES:    HEALTH MAINTENANCE: Social History  Substance Use Topics  . Smoking status: Former Research scientist (life sciences)  . Smokeless tobacco: Never Used  . Alcohol use No     Colonoscopy:  PAP:  Bone density:  Lipid panel:  No Known Allergies  Current Outpatient Prescriptions  Medication Sig Dispense Refill  . allopurinol (ZYLOPRIM) 100 MG tablet Take 100 mg by mouth daily.    Marland Kitchen amitriptyline (ELAVIL) 50 MG tablet Take 25 mg by mouth at bedtime.     Marland Kitchen aspirin EC 325 MG tablet Take 325 mg by mouth daily.    . benazepril (LOTENSIN) 10 MG tablet Take 10 mg by mouth daily. Reported on 06/28/2015    . calcium-vitamin D (OSCAL WITH D) 250-125 MG-UNIT tablet Take 2 tablets by mouth daily.    . clopidogrel (PLAVIX) 75 MG tablet Take 75 mg by mouth daily. Reported on 06/28/2015    . docusate sodium (COLACE) 100 MG capsule Take 50 mg by mouth 2 (two) times daily. Reported on 06/28/2015    . DULoxetine (CYMBALTA) 60 MG capsule Take 60 mg by mouth 2 (two) times daily.    . ferrous sulfate 325 (65 FE) MG  tablet Take 325 mg by mouth daily with breakfast. Reported on 06/28/2015    . Fluticasone-Salmeterol (ADVAIR) 250-50 MCG/DOSE AEPB Inhale 1 puff into the lungs 2 (two) times daily.    . folic acid (FOLVITE) 242 MCG tablet Take 400 mcg by mouth daily. Reported on 06/28/2015    . Insulin Aspart (NOVOLOG FLEXPEN [place]) Inject into the skin. Reported on 06/28/2015    . Ipratropium-Albuterol (COMBIVENT RESPIMAT) 20-100 MCG/ACT AERS respimat Inhale 1 puff into the lungs every 6 (six) hours.    . isosorbide mononitrate (IMDUR) 60 MG 24 hr tablet  Take 60 mg by mouth 2 (two) times daily.    . Liraglutide (VICTOZA [place]) Inject 1.8 mg into the skin every morning.    . loratadine (CLARITIN) 10 MG tablet Take 10 mg by mouth daily.    . metoprolol succinate (TOPROL-XL) 50 MG 24 hr tablet Take 50 mg by mouth daily. Take with or immediately following a meal.    . nitroGLYCERIN (NITROLINGUAL) 0.4 MG/SPRAY spray Place 1 spray under the tongue every 5 (five) minutes x 3 doses as needed for chest pain.    Marland Kitchen nystatin (MYCOSTATIN/NYSTOP) 100000 UNIT/GM POWD Apply topically 6 (six) times daily.    Marland Kitchen omeprazole (PRILOSEC) 20 MG capsule Take 20 mg by mouth 2 (two) times daily before a meal. Reported on 06/28/2015    . potassium chloride (K-DUR,KLOR-CON) 10 MEQ tablet Take 10 mEq by mouth 2 (two) times a week. Reported on 06/28/2015    . simvastatin (ZOCOR) 80 MG tablet Take 80 mg by mouth daily at 6 PM.    . traMADol (ULTRAM) 50 MG tablet Take 50 mg by mouth 3 (three) times daily as needed.     No current facility-administered medications for this visit.     OBJECTIVE: There were no vitals filed for this visit.   There is no height or weight on file to calculate BMI.    ECOG FS:0 - Asymptomatic  General:  Obese, sitting in a wheelchair. no acute distress. Eyes: Pink conjunctiva, anicteric sclera. Lungs: Clear to auscultation bilaterally. Heart: Regular rate and rhythm. No rubs, murmurs, or gallops. Abdomen: Soft, nontender, nondistended. No organomegaly noted, normoactive bowel sounds. Musculoskeletal: No edema, cyanosis, or clubbing. Neuro: Alert, answering all questions appropriately. Cranial nerves grossly intact. Skin: No rashes or petechiae noted. Psych: Normal affect.   LAB RESULTS:  Lab Results  Component Value Date   NA 133 (L) 12/28/2015   K 3.9 12/28/2015   CL 96 (L) 12/28/2015   CO2 28 12/28/2015   GLUCOSE 85 12/28/2015   BUN 32 (H) 12/28/2015   CREATININE 1.21 (H) 12/28/2015   CALCIUM 8.6 (L) 12/28/2015   PROT 7.2 12/28/2015    ALBUMIN 4.0 12/28/2015   AST 21 12/28/2015   ALT 15 12/28/2015   ALKPHOS 45 12/28/2015   BILITOT 0.6 12/28/2015   GFRNONAA 47 (L) 12/28/2015   GFRAA 55 (L) 12/28/2015    Lab Results  Component Value Date   WBC 7.7 01/24/2016   NEUTROABS 5.7 01/24/2016   HGB 10.3 (L) 01/24/2016   HCT 29.5 (L) 01/24/2016   MCV 85.3 01/24/2016   PLT 123 (L) 01/24/2016   Lab Results  Component Value Date   IRON 36 12/22/2014   TIBC 276 12/22/2014   IRONPCTSAT 13 12/22/2014    Lab Results  Component Value Date   FERRITIN 100 12/22/2014     STUDIES: No results found.  ASSESSMENT: Anemia in chronic kidney disease and thrombocytopenia:  PLAN:  1. Anemia in chronic kidney disease: Patient's hemoglobin is decreased, but stable at 9.8. Previously, all of her labwork including iron stores, B12, folate, hemolysis labs, and ANA were within normal limits. Her erythropoietin level is appropriately elevated.  No intervention is needed at this time. Return to clinic in 6 months for laboratory work only and then in 3 months for further evaluation.  2. Thrombocytopenia: Patient's platelet count has significantly declined is now 71. Previously, all of her laboratory work was either negative or within normal limits. Follow-up as above. Consider bone marrow biopsy in the future if no improvement in her blood counts. 3. Chronic renal insufficiency: Mild, although patient has an elevated erythropoietin level she may benefit from Procrit in the future.  Patient expressed understanding and was in agreement with this plan. She also understands that She can call clinic at any time with any questions, concerns, or complaints.   Lloyd Huger, MD   04/15/2016 5:31 PM

## 2016-04-16 ENCOUNTER — Inpatient Hospital Stay: Payer: Medicaid Other | Admitting: Oncology

## 2016-05-21 NOTE — Progress Notes (Deleted)
[place]  Telephone:(336) [phone] Fax:(336) [phone]  ID: Deanna Rice OB: Mar 08, 1955  MR#: [id number]  EXN#:[id number]  Patient Care Team: Lavera Guise, MD as PCP - General (Internal Medicine)  CHIEF COMPLAINT: Anemia in chronic kidney disease and thrombocytopenia  INTERVAL HISTORY: Patient returns to clinic today for repeat laboratory work and further evaluation. She currently feels well and is at her baseline. She has chronic weakness and shortness of breath. She has no neurologic complaints. She denies any fevers. She denies any chest pain. She denies any nausea or vomiting. She has no melena or hematochezia. She has no urinary complaints. Patient offers no further specific complaints today.  REVIEW OF SYSTEMS:   Review of Systems  Constitutional: Positive for malaise/fatigue. Negative for fever and weight loss.  Respiratory: Positive for shortness of breath. Negative for cough.   Cardiovascular: Negative.  Negative for chest pain.  Gastrointestinal: Negative.  Negative for abdominal pain, blood in stool and melena.  Genitourinary: Negative.   Musculoskeletal: Negative.   Neurological: Positive for weakness.  Psychiatric/Behavioral: Negative.     As per HPI. Otherwise, a complete review of systems is negatve.  PAST MEDICAL HISTORY: Past Medical History:  Diagnosis Date  . Anemia, unspecified   . CHF (congestive heart failure) ([place])   . Chronic ischemic heart disease, unspecified   . COPD (chronic obstructive pulmonary disease) ([place])   . Dependence on supplemental oxygen   . Diabetes mellitus without complication ([place])   . Gout, unspecified   . Hypertension   . Mixed hyperlipidemia   . Obstructive chronic bronchitis without exacerbation ([place])   . Obstructive sleep apnea syndrome   . Peripheral venous insufficiency   . Primary generalized hypertrophic osteoarthrosis   . Primary hypothyroidism   . Severe recurrent major depression with psychotic  features ([place])     PAST SURGICAL HISTORY: Past Surgical History:  Procedure Laterality Date  . TONSILLECTOMY      FAMILY HISTORY Family History  Problem Relation Age of Onset  . Hypertension Mother   . Diabetes Mother   . CAD Mother   . CAD Father   . Diabetes Father   . Hypertension Father   . Hypertension Sister   . Diabetes Sister   . CAD Sister        ADVANCED DIRECTIVES:    HEALTH MAINTENANCE: Social History  Substance Use Topics  . Smoking status: Former Research scientist (life sciences)  . Smokeless tobacco: Never Used  . Alcohol use No     Colonoscopy:  PAP:  Bone density:  Lipid panel:  No Known Allergies  Current Outpatient Prescriptions  Medication Sig Dispense Refill  . allopurinol (ZYLOPRIM) 100 MG tablet Take 100 mg by mouth daily.    Marland Kitchen amitriptyline (ELAVIL) 50 MG tablet Take 25 mg by mouth at bedtime.     Marland Kitchen aspirin EC 325 MG tablet Take 325 mg by mouth daily.    . benazepril (LOTENSIN) 10 MG tablet Take 10 mg by mouth daily. Reported on 06/28/2015    . calcium-vitamin D (OSCAL WITH D) 250-125 MG-UNIT tablet Take 2 tablets by mouth daily.    . clopidogrel (PLAVIX) 75 MG tablet Take 75 mg by mouth daily. Reported on 06/28/2015    . docusate sodium (COLACE) 100 MG capsule Take 50 mg by mouth 2 (two) times daily. Reported on 06/28/2015    . DULoxetine (CYMBALTA) 60 MG capsule Take 60 mg by mouth 2 (two) times daily.    . ferrous sulfate 325 (65 FE) MG  tablet Take 325 mg by mouth daily with breakfast. Reported on 06/28/2015    . Fluticasone-Salmeterol (ADVAIR) 250-50 MCG/DOSE AEPB Inhale 1 puff into the lungs 2 (two) times daily.    . folic acid (FOLVITE) 242 MCG tablet Take 400 mcg by mouth daily. Reported on 06/28/2015    . Insulin Aspart (NOVOLOG FLEXPEN [place]) Inject into the skin. Reported on 06/28/2015    . Ipratropium-Albuterol (COMBIVENT RESPIMAT) 20-100 MCG/ACT AERS respimat Inhale 1 puff into the lungs every 6 (six) hours.    . isosorbide mononitrate (IMDUR) 60 MG 24 hr tablet  Take 60 mg by mouth 2 (two) times daily.    . Liraglutide (VICTOZA [place]) Inject 1.8 mg into the skin every morning.    . loratadine (CLARITIN) 10 MG tablet Take 10 mg by mouth daily.    . metoprolol succinate (TOPROL-XL) 50 MG 24 hr tablet Take 50 mg by mouth daily. Take with or immediately following a meal.    . nitroGLYCERIN (NITROLINGUAL) 0.4 MG/SPRAY spray Place 1 spray under the tongue every 5 (five) minutes x 3 doses as needed for chest pain.    Marland Kitchen nystatin (MYCOSTATIN/NYSTOP) 100000 UNIT/GM POWD Apply topically 6 (six) times daily.    Marland Kitchen omeprazole (PRILOSEC) 20 MG capsule Take 20 mg by mouth 2 (two) times daily before a meal. Reported on 06/28/2015    . potassium chloride (K-DUR,KLOR-CON) 10 MEQ tablet Take 10 mEq by mouth 2 (two) times a week. Reported on 06/28/2015    . simvastatin (ZOCOR) 80 MG tablet Take 80 mg by mouth daily at 6 PM.    . traMADol (ULTRAM) 50 MG tablet Take 50 mg by mouth 3 (three) times daily as needed.     No current facility-administered medications for this visit.     OBJECTIVE: There were no vitals filed for this visit.   There is no height or weight on file to calculate BMI.    ECOG FS:0 - Asymptomatic  General:  Obese, sitting in a wheelchair. no acute distress. Eyes: Pink conjunctiva, anicteric sclera. Lungs: Clear to auscultation bilaterally. Heart: Regular rate and rhythm. No rubs, murmurs, or gallops. Abdomen: Soft, nontender, nondistended. No organomegaly noted, normoactive bowel sounds. Musculoskeletal: No edema, cyanosis, or clubbing. Neuro: Alert, answering all questions appropriately. Cranial nerves grossly intact. Skin: No rashes or petechiae noted. Psych: Normal affect.   LAB RESULTS:  Lab Results  Component Value Date   NA 133 (L) 12/28/2015   K 3.9 12/28/2015   CL 96 (L) 12/28/2015   CO2 28 12/28/2015   GLUCOSE 85 12/28/2015   BUN 32 (H) 12/28/2015   CREATININE 1.21 (H) 12/28/2015   CALCIUM 8.6 (L) 12/28/2015   PROT 7.2 12/28/2015    ALBUMIN 4.0 12/28/2015   AST 21 12/28/2015   ALT 15 12/28/2015   ALKPHOS 45 12/28/2015   BILITOT 0.6 12/28/2015   GFRNONAA 47 (L) 12/28/2015   GFRAA 55 (L) 12/28/2015    Lab Results  Component Value Date   WBC 7.7 01/24/2016   NEUTROABS 5.7 01/24/2016   HGB 10.3 (L) 01/24/2016   HCT 29.5 (L) 01/24/2016   MCV 85.3 01/24/2016   PLT 123 (L) 01/24/2016   Lab Results  Component Value Date   IRON 36 12/22/2014   TIBC 276 12/22/2014   IRONPCTSAT 13 12/22/2014    Lab Results  Component Value Date   FERRITIN 100 12/22/2014     STUDIES: No results found.  ASSESSMENT: Anemia in chronic kidney disease and thrombocytopenia:  PLAN:  1. Anemia in chronic kidney disease: Patient's hemoglobin is decreased, but stable at 9.8. Previously, all of her labwork including iron stores, B12, folate, hemolysis labs, and ANA were within normal limits. Her erythropoietin level is appropriately elevated.  No intervention is needed at this time. Return to clinic in 6 months for laboratory work only and then in 3 months for further evaluation.  2. Thrombocytopenia: Patient's platelet count has significantly declined is now 71. Previously, all of her laboratory work was either negative or within normal limits. Follow-up as above. Consider bone marrow biopsy in the future if no improvement in her blood counts. 3. Chronic renal insufficiency: Mild, although patient has an elevated erythropoietin level she may benefit from Procrit in the future.  Patient expressed understanding and was in agreement with this plan. She also understands that She can call clinic at any time with any questions, concerns, or complaints.   Lloyd Huger, MD   05/21/2016 10:46 PM

## 2016-05-22 ENCOUNTER — Inpatient Hospital Stay: Payer: Medicaid Other

## 2016-05-22 ENCOUNTER — Encounter: Payer: Self-pay | Admitting: Oncology

## 2016-05-22 ENCOUNTER — Inpatient Hospital Stay: Payer: Medicaid Other | Admitting: Oncology

## 2016-05-22 ENCOUNTER — Inpatient Hospital Stay: Payer: Medicaid Other | Attending: Oncology | Admitting: Oncology

## 2016-05-22 VITALS — BP 107/66 | HR 81 | Temp 97.8°F | Resp 18 | Ht 67.0 in | Wt 338.6 lb

## 2016-05-22 DIAGNOSIS — E1122 Type 2 diabetes mellitus with diabetic chronic kidney disease: Secondary | ICD-10-CM | POA: Diagnosis not present

## 2016-05-22 DIAGNOSIS — D696 Thrombocytopenia, unspecified: Secondary | ICD-10-CM

## 2016-05-22 DIAGNOSIS — Z7982 Long term (current) use of aspirin: Secondary | ICD-10-CM

## 2016-05-22 DIAGNOSIS — Z794 Long term (current) use of insulin: Secondary | ICD-10-CM | POA: Diagnosis not present

## 2016-05-22 DIAGNOSIS — I872 Venous insufficiency (chronic) (peripheral): Secondary | ICD-10-CM | POA: Diagnosis not present

## 2016-05-22 DIAGNOSIS — D631 Anemia in chronic kidney disease: Secondary | ICD-10-CM | POA: Diagnosis not present

## 2016-05-22 DIAGNOSIS — J449 Chronic obstructive pulmonary disease, unspecified: Secondary | ICD-10-CM | POA: Diagnosis not present

## 2016-05-22 DIAGNOSIS — N189 Chronic kidney disease, unspecified: Secondary | ICD-10-CM | POA: Insufficient documentation

## 2016-05-22 DIAGNOSIS — R5381 Other malaise: Secondary | ICD-10-CM | POA: Diagnosis not present

## 2016-05-22 DIAGNOSIS — Z79899 Other long term (current) drug therapy: Secondary | ICD-10-CM | POA: Diagnosis not present

## 2016-05-22 DIAGNOSIS — R531 Weakness: Secondary | ICD-10-CM

## 2016-05-22 DIAGNOSIS — I259 Chronic ischemic heart disease, unspecified: Secondary | ICD-10-CM

## 2016-05-22 DIAGNOSIS — R5382 Chronic fatigue, unspecified: Secondary | ICD-10-CM | POA: Diagnosis not present

## 2016-05-22 DIAGNOSIS — M109 Gout, unspecified: Secondary | ICD-10-CM | POA: Insufficient documentation

## 2016-05-22 DIAGNOSIS — Z87891 Personal history of nicotine dependence: Secondary | ICD-10-CM | POA: Diagnosis not present

## 2016-05-22 DIAGNOSIS — E669 Obesity, unspecified: Secondary | ICD-10-CM

## 2016-05-22 DIAGNOSIS — I130 Hypertensive heart and chronic kidney disease with heart failure and stage 1 through stage 4 chronic kidney disease, or unspecified chronic kidney disease: Secondary | ICD-10-CM

## 2016-05-22 DIAGNOSIS — E039 Hypothyroidism, unspecified: Secondary | ICD-10-CM | POA: Diagnosis not present

## 2016-05-22 DIAGNOSIS — G4733 Obstructive sleep apnea (adult) (pediatric): Secondary | ICD-10-CM

## 2016-05-22 LAB — CBC WITH DIFFERENTIAL/PLATELET
Basophils Absolute: 0.4 10*3/uL — ABNORMAL HIGH (ref 0–0.1)
Basophils Relative: 4 %
EOS PCT: 1 %
Eosinophils Absolute: 0.1 10*3/uL (ref 0–0.7)
HCT: 32.9 % — ABNORMAL LOW (ref 35.0–47.0)
Hemoglobin: 11.3 g/dL — ABNORMAL LOW (ref 12.0–16.0)
LYMPHS ABS: 1.2 10*3/uL (ref 1.0–3.6)
Lymphocytes Relative: 11 %
MCH: 29.9 pg (ref 26.0–34.0)
MCHC: 34.4 g/dL (ref 32.0–36.0)
MCV: 87 fL (ref 80.0–100.0)
MONO ABS: 0.3 10*3/uL (ref 0.2–0.9)
MONOS PCT: 3 %
Neutro Abs: 8.9 10*3/uL — ABNORMAL HIGH (ref 1.4–6.5)
Neutrophils Relative %: 81 %
PLATELETS: 146 10*3/uL — AB (ref 150–440)
RBC: 3.78 MIL/uL — ABNORMAL LOW (ref 3.80–5.20)
RDW: 15.1 % — ABNORMAL HIGH (ref 11.5–14.5)
WBC: 11.1 10*3/uL — ABNORMAL HIGH (ref 3.6–11.0)

## 2016-05-22 LAB — IRON AND TIBC
IRON: 62 ug/dL (ref 28–170)
Saturation Ratios: 19 % (ref 10.4–31.8)
TIBC: 324 ug/dL (ref 250–450)
UIBC: 262 ug/dL

## 2016-05-22 LAB — FERRITIN: Ferritin: 159 ng/mL (ref 11–307)

## 2016-05-22 NOTE — Progress Notes (Signed)
[facility]  Telephone:([phone]) [phone] [phone]  ID: Deanna Rice OB: [birth year]  MR#: [id number]  AOZ#:[id number]  Patient Care Team: Lyndon Code, MD as PCP - General (Internal Medicine)  CHIEF COMPLAINT: Anemia in chronic kidney disease and thrombocytopenia  INTERVAL HISTORY: Patient returns to clinic today for repeat laboratory work and further evaluation. She currently feels well and is at her baseline. She has chronic weakness, fatigue, and shortness of breath. She has no neurologic complaints. She denies any fevers. She denies any chest pain. She denies any nausea, vomiting, constipation, or diarrhea. She has no melena or hematochezia. She has no urinary complaints. Patient offers no further specific complaints today.  REVIEW OF SYSTEMS:   Review of Systems  Constitutional: Positive for malaise/fatigue. Negative for fever and weight loss.  Respiratory: Positive for shortness of breath. Negative for cough.   Cardiovascular: Negative.  Negative for chest pain and leg swelling.  Gastrointestinal: Negative.  Negative for abdominal pain, blood in stool and melena.  Genitourinary: Negative.   Musculoskeletal: Negative.   Neurological: Positive for weakness.  Psychiatric/Behavioral: Negative.  The patient is not nervous/anxious.     As per HPI. Otherwise, a complete review of systems is negatve.  PAST MEDICAL HISTORY: Past Medical History:  Diagnosis Date  . Anemia, unspecified   . CHF (congestive heart failure) (HCC)   . Chronic ischemic heart disease, unspecified   . COPD (chronic obstructive pulmonary disease) (HCC)   . Dependence on supplemental oxygen   . Diabetes mellitus without complication (HCC)   . Gout, unspecified   . Hypertension   . Mixed hyperlipidemia   . 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 Obstructive sleep apnea syndrome   . Peripheral venous insufficiency   . Primary generalized hypertrophic  osteoarthrosis   . Primary hypothyroidism   . Severe recurrent major depression with psychotic features (HCC)     PAST SURGICAL HISTORY: Past Surgical History:  Procedure Laterality Date  . TONSILLECTOMY      FAMILY HISTORY Family History  Problem Relation Age of Onset  . Hypertension Mother   . Diabetes Mother   . CAD Mother   . CAD Father   . Diabetes Father   . Hypertension Father   . Hypertension Sister   . Diabetes Sister   . CAD Sister        ADVANCED DIRECTIVES:    HEALTH MAINTENANCE: Social History  Substance Use Topics  . Smoking status: Former Games developer  . Smokeless tobacco: Never Used  . Alcohol use No     Colonoscopy:  PAP:  Bone density:  Lipid panel:  No Known Allergies  Current Outpatient Prescriptions  Medication Sig Dispense Refill  . allopurinol (ZYLOPRIM) 100 MG tablet Take 100 mg by mouth daily.    Marland Kitchen amitriptyline (ELAVIL) 25 MG tablet Take 50 mg by mouth at bedtime.    . diclofenac sodium (VOLTAREN) 1 % GEL Apply 2 g topically 3 (three) times daily.    Marland Kitchen docusate sodium (COLACE) 100 MG capsule Take 50 mg by mouth daily as needed. Reported on 06/28/2015    . DULoxetine (CYMBALTA) 60 MG capsule Take 60 mg by mouth 2 (two) times daily.    . fluticasone (FLONASE) 50 MCG/ACT nasal spray Place 2 sprays into both nostrils daily.    . Fluticasone-Salmeterol (ADVAIR) 250-50 MCG/DOSE AEPB Inhale 1 puff into the lungs 2 (two) times daily.    . folic acid (FOLVITE) 1 MG tablet Take 1 mg by mouth  daily.    . Insulin Glargine (BASAGLAR KWIKPEN) 100 UNIT/ML SOPN Inject into the skin 2 (two) times daily. 40  Units in am, 12 units in pm    . Ipratropium-Albuterol (COMBIVENT RESPIMAT) 20-100 MCG/ACT AERS respimat Inhale 1 puff into the lungs every 6 (six) hours.    . isosorbide dinitrate (ISORDIL) 30 MG tablet Take 30 mg by mouth daily.    . Liraglutide (VICTOZA [place]) Inject 1.8 mg into the skin every morning.    . loratadine (CLARITIN) 10 MG tablet Take 10 mg by  mouth daily.    . Melatonin 10 MG TABS Take 40 mg by mouth at bedtime.    . metoprolol tartrate (LOPRESSOR) 25 MG tablet Take 25 mg by mouth daily.    . Multiple Vitamins-Minerals (MULTIVITAMIN WITH MINERALS) tablet Take 1 tablet by mouth daily.    . nitroGLYCERIN (NITROLINGUAL) 0.4 MG/SPRAY spray Place 1 spray under the tongue every 5 (five) minutes x 3 doses as needed for chest pain.    Marland Kitchen. nystatin (MYCOSTATIN/NYSTOP) 100000 UNIT/GM POWD Apply topically 6 (six) times daily.    . simvastatin (ZOCOR) 80 MG tablet Take 80 mg by mouth daily at 6 PM.    . traMADol (ULTRAM) 50 MG tablet Take 50 mg by mouth 3 (three) times daily as needed.    Marland Kitchen. aspirin EC 325 MG tablet Take 325 mg by mouth daily.     No current facility-administered medications for this visit.     OBJECTIVE: Vitals:   05/22/16 1229  BP: 107/66  Pulse: 81  Resp: 18  Temp: 97.8 F (36.6 C)     Body mass index is 53.03 kg/m.    ECOG FS:0 - Asymptomatic  General:  Obese, sitting in a wheelchair. no acute distress. Eyes: Pink conjunctiva, anicteric sclera. Lungs: Clear to auscultation bilaterally. Heart: Regular rate and rhythm. No rubs, murmurs, or gallops. Abdomen: Soft, nontender, nondistended. No organomegaly noted, normoactive bowel sounds. Musculoskeletal: No edema, cyanosis, or clubbing. Neuro: Alert, answering all questions appropriately. Cranial nerves grossly intact. Skin: No rashes or petechiae noted. Psych: Normal affect.   LAB RESULTS:  Lab Results  Component Value Date   NA 133 (L) 12/28/2015   K 3.9 12/28/2015   CL 96 (L) 12/28/2015   CO2 28 12/28/2015   GLUCOSE 85 12/28/2015   BUN 32 (H) 12/28/2015   CREATININE 1.21 (H) 12/28/2015   CALCIUM 8.6 (L) 12/28/2015   PROT 7.2 12/28/2015   ALBUMIN 4.0 12/28/2015   AST 21 12/28/2015   ALT 15 12/28/2015   ALKPHOS 45 12/28/2015   BILITOT 0.6 12/28/2015   GFRNONAA 47 (L) 12/28/2015   GFRAA 55 (L) 12/28/2015    Lab Results  Component Value Date    WBC 11.1 (H) 05/22/2016   NEUTROABS 8.9 (H) 05/22/2016   HGB 11.3 (L) 05/22/2016   HCT 32.9 (L) 05/22/2016   MCV 87.0 05/22/2016   PLT 146 (L) 05/22/2016   Lab Results  Component Value Date   IRON 62 05/22/2016   TIBC 324 05/22/2016   IRONPCTSAT 19 05/22/2016    Lab Results  Component Value Date   FERRITIN 159 05/22/2016     STUDIES: b  No results found.  ASSESSMENT: Anemia in chronic kidney disease and thrombocytopenia:  PLAN:    1. Anemia in chronic kidney disease: Patient's hemoglobin has improved and is greater than 11.0. Her iron stores are also within normal limits. Previously, all of her labwork was either negative or within normal limits. No intervention is  needed at this time. Return to clinic in 4 months for laboratory work and further evaluation.  2. Thrombocytopenia: Patient's platelet count slightly decreased, but improved. Previously, all of her laboratory work was either negative or within normal limits. Follow-up as above.  3. Chronic renal insufficiency: Mild, if patient's hemoglobin falls below 10.0 she may benefit from Procrit in the future.  Patient expressed understanding and was in agreement with this plan. She also understands that She can call clinic at any time with any questions, concerns, or complaints.   Jeralyn Ruths, MD   05/23/2016 9:14 AM

## 2016-05-22 NOTE — Progress Notes (Signed)
Pt has been having stomach issues-esp. When she finishes eating it hurts the most. Takes antacids a lot.

## 2016-09-30 ENCOUNTER — Inpatient Hospital Stay: Payer: Medicaid Other

## 2016-09-30 ENCOUNTER — Inpatient Hospital Stay: Payer: Medicaid Other | Admitting: Oncology

## 2016-10-15 NOTE — Progress Notes (Deleted)
[facility]  [phone] [phone]  ID: Deanna Rice OB: 01-25-1956  MR#: [id number]  QQP#:[id number]  Patient Care Team: Lyndon Code, MD as PCP - General (Internal Medicine)  CHIEF COMPLAINT: Anemia in chronic kidney disease and thrombocytopenia  INTERVAL HISTORY: Patient returns to clinic today for repeat laboratory work and further evaluation. She currently feels well and is at her baseline. She has chronic weakness, fatigue, and shortness of breath. She has no neurologic complaints. She denies any fevers. She denies any chest pain. She denies any nausea, vomiting, constipation, or diarrhea. She has no melena or hematochezia. She has no urinary complaints. Patient offers no further specific complaints today.  REVIEW OF SYSTEMS:   Review of Systems  Constitutional: Positive for malaise/fatigue. Negative for fever and weight loss.  Respiratory: Positive for shortness of breath. Negative for cough.   Cardiovascular: Negative.  Negative for chest pain and leg swelling.  Gastrointestinal: Negative.  Negative for abdominal pain, blood in stool and melena.  Genitourinary: Negative.   Musculoskeletal: Negative.   Neurological: Positive for weakness.  Psychiatric/Behavioral: Negative.  The patient is not nervous/anxious.     As per HPI. Otherwise, a complete review of systems is negatve.  PAST MEDICAL HISTORY: Past Medical History:  Diagnosis Date  . Anemia, unspecified   . CHF (congestive heart failure) (HCC)   . Chronic ischemic heart disease, unspecified   . COPD (chronic obstructive pulmonary disease) (HCC)   . Dependence on supplemental oxygen   . Diabetes mellitus without complication (HCC)   . Gout, unspecified   . Hypertension   . Mixed hyperlipidemia   . 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 Obstructive sleep apnea syndrome   . Peripheral venous insufficiency   . Primary generalized hypertrophic  osteoarthrosis   . Primary hypothyroidism   . Severe recurrent major depression with psychotic features (HCC)     PAST SURGICAL HISTORY: Past Surgical History:  Procedure Laterality Date  . TONSILLECTOMY      FAMILY HISTORY Family History  Problem Relation Age of Onset  . Hypertension Mother   . Diabetes Mother   . CAD Mother   . CAD Father   . Diabetes Father   . Hypertension Father   . Hypertension Sister   . Diabetes Sister   . CAD Sister        ADVANCED DIRECTIVES:    HEALTH MAINTENANCE: Social History  Substance Use Topics  . Smoking status: Former Games developer  . Smokeless tobacco: Never Used  . Alcohol use No     Colonoscopy:  PAP:  Bone density:  Lipid panel:  No Known Allergies  Current Outpatient Prescriptions  Medication Sig Dispense Refill  . allopurinol (ZYLOPRIM) 100 MG tablet Take 100 mg by mouth daily.    Marland Kitchen amitriptyline (ELAVIL) 25 MG tablet Take 50 mg by mouth at bedtime.    Marland Kitchen aspirin EC 325 MG tablet Take 325 mg by mouth daily.    . diclofenac sodium (VOLTAREN) 1 % GEL Apply 2 g topically 3 (three) times daily.    Marland Kitchen docusate sodium (COLACE) 100 MG capsule Take 50 mg by mouth daily as needed. Reported on 06/28/2015    . DULoxetine (CYMBALTA) 60 MG capsule Take 60 mg by mouth 2 (two) times daily.    . fluticasone (FLONASE) 50 MCG/ACT nasal spray Place 2 sprays into both nostrils daily.    . Fluticasone-Salmeterol (ADVAIR) 250-50 MCG/DOSE AEPB Inhale 1 puff into the lungs 2 (two) times daily.    Marland Kitchen  folic acid (FOLVITE) 1 MG tablet Take 1 mg by mouth daily.    . Insulin Glargine (BASAGLAR KWIKPEN) 100 UNIT/ML SOPN Inject into the skin 2 (two) times daily. 40  Units in am, 12 units in pm    . Ipratropium-Albuterol (COMBIVENT RESPIMAT) 20-100 MCG/ACT AERS respimat Inhale 1 puff into the lungs every 6 (six) hours.    . isosorbide dinitrate (ISORDIL) 30 MG tablet Take 30 mg by mouth daily.    . Liraglutide (VICTOZA [place]) Inject 1.8 mg into the skin every  morning.    . loratadine (CLARITIN) 10 MG tablet Take 10 mg by mouth daily.    . Melatonin 10 MG TABS Take 40 mg by mouth at bedtime.    . metoprolol tartrate (LOPRESSOR) 25 MG tablet Take 25 mg by mouth daily.    . Multiple Vitamins-Minerals (MULTIVITAMIN WITH MINERALS) tablet Take 1 tablet by mouth daily.    . nitroGLYCERIN (NITROLINGUAL) 0.4 MG/SPRAY spray Place 1 spray under the tongue every 5 (five) minutes x 3 doses as needed for chest pain.    Marland Kitchen nystatin (MYCOSTATIN/NYSTOP) 100000 UNIT/GM POWD Apply topically 6 (six) times daily.    . simvastatin (ZOCOR) 80 MG tablet Take 80 mg by mouth daily at 6 PM.    . traMADol (ULTRAM) 50 MG tablet Take 50 mg by mouth 3 (three) times daily as needed.     No current facility-administered medications for this visit.     OBJECTIVE: There were no vitals filed for this visit.   There is no height or weight on file to calculate BMI.    ECOG FS:0 - Asymptomatic  General:  Obese, sitting in a wheelchair. no acute distress. Eyes: Pink conjunctiva, anicteric sclera. Lungs: Clear to auscultation bilaterally. Heart: Regular rate and rhythm. No rubs, murmurs, or gallops. Abdomen: Soft, nontender, nondistended. No organomegaly noted, normoactive bowel sounds. Musculoskeletal: No edema, cyanosis, or clubbing. Neuro: Alert, answering all questions appropriately. Cranial nerves grossly intact. Skin: No rashes or petechiae noted. Psych: Normal affect.   LAB RESULTS:  Lab Results  Component Value Date   NA 133 (L) 12/28/2015   K 3.9 12/28/2015   CL 96 (L) 12/28/2015   CO2 28 12/28/2015   GLUCOSE 85 12/28/2015   BUN 32 (H) 12/28/2015   CREATININE 1.21 (H) 12/28/2015   CALCIUM 8.6 (L) 12/28/2015   PROT 7.2 12/28/2015   ALBUMIN 4.0 12/28/2015   AST 21 12/28/2015   ALT 15 12/28/2015   ALKPHOS 45 12/28/2015   BILITOT 0.6 12/28/2015   GFRNONAA 47 (L) 12/28/2015   GFRAA 55 (L) 12/28/2015    Lab Results  Component Value Date   WBC 11.1 (H)  05/22/2016   NEUTROABS 8.9 (H) 05/22/2016   HGB 11.3 (L) 05/22/2016   HCT 32.9 (L) 05/22/2016   MCV 87.0 05/22/2016   PLT 146 (L) 05/22/2016   Lab Results  Component Value Date   IRON 62 05/22/2016   TIBC 324 05/22/2016   IRONPCTSAT 19 05/22/2016    Lab Results  Component Value Date   FERRITIN 159 05/22/2016     STUDIES: b  No results found.  ASSESSMENT: Anemia in chronic kidney disease and thrombocytopenia:  PLAN:    1. Anemia in chronic kidney disease: Patient's hemoglobin has improved and is greater than 11.0. Her iron stores are also within normal limits. Previously, all of her labwork was either negative or within normal limits. No intervention is needed at this time. Return to clinic in 4 months for laboratory work  and further evaluation.  2. Thrombocytopenia: Patient's platelet count slightly decreased, but improved. Previously, all of her laboratory work was either negative or within normal limits. Follow-up as above.  3. Chronic renal insufficiency: Mild, if patient's hemoglobin falls below 10.0 she may benefit from Procrit in the future.  Patient expressed understanding and was in agreement with this plan. She also understands that She can call clinic at any time with any questions, concerns, or complaints.   Jeralyn Ruths, MD   10/15/2016 9:13 PM

## 2016-10-16 ENCOUNTER — Inpatient Hospital Stay: Payer: Medicaid Other | Admitting: Oncology

## 2016-10-16 ENCOUNTER — Inpatient Hospital Stay: Payer: Medicaid Other

## 2016-11-12 NOTE — Progress Notes (Signed)
[facility]  [phone] [phone]) [phone]  ID: Deanna Rice OB: [birth year]  MR#: [id number]  MHD#:[id number]  Patient Care Team: Lavera Guise, MD as PCP - General (Internal Medicine)  CHIEF COMPLAINT: Anemia in chronic kidney disease and thrombocytopenia  INTERVAL HISTORY: Patient returns to clinic today for repeat laboratory work and further evaluation. She currently feels well and is at her baseline. She has chronic weakness, fatigue, and shortness of breath. She has no neurologic complaints. She denies any fevers. She denies any chest pain. She denies any nausea, vomiting, constipation, or diarrhea. She has no melena or hematochezia. She has no urinary complaints. Patient offers no further specific complaints today.  REVIEW OF SYSTEMS:   Review of Systems  Constitutional: Positive for malaise/fatigue. Negative for fever and weight loss.  Respiratory: Positive for shortness of breath. Negative for cough.   Cardiovascular: Negative.  Negative for chest pain and leg swelling.  Gastrointestinal: Negative.  Negative for abdominal pain, blood in stool and melena.  Genitourinary: Negative.   Musculoskeletal: Negative.   Skin: Negative.  Negative for rash.  Neurological: Positive for weakness.  Psychiatric/Behavioral: Negative.  The patient is not nervous/anxious.     As per HPI. Otherwise, a complete review of systems is negatve.  PAST MEDICAL HISTORY: Past Medical History:  Diagnosis Date  . Anemia, unspecified   . CHF (congestive heart failure) ([place])   . Chronic ischemic heart disease, unspecified   . COPD (chronic obstructive pulmonary disease) ([place])   . Dependence on supplemental oxygen   . Diabetes mellitus without complication ([place])   . Gout, unspecified   . Hypertension   . Mixed hyperlipidemia   . Obstructive chronic bronchitis without exacerbation ([place])   . Obstructive sleep apnea syndrome   . Peripheral venous insufficiency   .  Primary generalized hypertrophic osteoarthrosis   . Primary hypothyroidism   . Severe recurrent major depression with psychotic features ([place])     PAST SURGICAL HISTORY: Past Surgical History:  Procedure Laterality Date  . TONSILLECTOMY      FAMILY HISTORY Family History  Problem Relation Age of Onset  . Hypertension Mother   . Diabetes Mother   . CAD Mother   . CAD Father   . Diabetes Father   . Hypertension Father   . Hypertension Sister   . Diabetes Sister   . CAD Sister        ADVANCED DIRECTIVES:    HEALTH MAINTENANCE: Social History  Substance Use Topics  . Smoking status: Former Research scientist (life sciences)  . Smokeless tobacco: Never Used  . Alcohol use No     Colonoscopy:  PAP:  Bone density:  Lipid panel:  No Known Allergies  Current Outpatient Prescriptions  Medication Sig Dispense Refill  . allopurinol (ZYLOPRIM) 100 MG tablet Take 100 mg by mouth daily.    Marland Kitchen amitriptyline (ELAVIL) 25 MG tablet Take 50 mg by mouth at bedtime.    . docusate sodium (COLACE) 100 MG capsule Take 50 mg by mouth daily as needed. Reported on 06/28/2015    . DULoxetine (CYMBALTA) 60 MG capsule Take 60 mg by mouth 2 (two) times daily.    . fluticasone (FLONASE) 50 MCG/ACT nasal spray Place 2 sprays into both nostrils daily.    . Fluticasone-Salmeterol (ADVAIR) 250-50 MCG/DOSE AEPB Inhale 1 puff into the lungs 2 (two) times daily.    . folic acid (FOLVITE) 1 MG tablet Take 1 mg by mouth daily.    . Insulin Glargine (BASAGLAR KWIKPEN) 100 UNIT/ML  SOPN Inject into the skin 2 (two) times daily. 40  Units in am, 12 units in pm    . Ipratropium-Albuterol (COMBIVENT RESPIMAT) 20-100 MCG/ACT AERS respimat Inhale 1 puff into the lungs every 6 (six) hours.    . isosorbide dinitrate (ISORDIL) 30 MG tablet Take 30 mg by mouth daily.    . Liraglutide (VICTOZA [place]) Inject 1.8 mg into the skin every morning.    . loratadine (CLARITIN) 10 MG tablet Take 10 mg by mouth daily.    . Melatonin 10 MG TABS Take 40 mg  by mouth at bedtime.    . metoprolol tartrate (LOPRESSOR) 25 MG tablet Take 25 mg by mouth daily.    . Multiple Vitamins-Minerals (MULTIVITAMIN WITH MINERALS) tablet Take 1 tablet by mouth daily.    . nitroGLYCERIN (NITROLINGUAL) 0.4 MG/SPRAY spray Place 1 spray under the tongue every 5 (five) minutes x 3 doses as needed for chest pain.    Marland Kitchen nystatin (MYCOSTATIN/NYSTOP) 100000 UNIT/GM POWD Apply topically 6 (six) times daily.    . simvastatin (ZOCOR) 80 MG tablet Take 80 mg by mouth daily at 6 PM.    . traMADol (ULTRAM) 50 MG tablet Take 50 mg by mouth 3 (three) times daily as needed.    Marland Kitchen aspirin EC 325 MG tablet Take 325 mg by mouth daily.    . diclofenac sodium (VOLTAREN) 1 % GEL Apply 2 g topically 3 (three) times daily.     No current facility-administered medications for this visit.     OBJECTIVE: Vitals:   11/13/16 1511  BP: 111/68  Pulse: 80  Resp: 20  Temp: 97.7 F (36.5 C)     There is no height or weight on file to calculate BMI.    ECOG FS:1 - Symptomatic but completely ambulatory  General:  Obese, sitting in a wheelchair. no acute distress. Eyes: Pink conjunctiva, anicteric sclera. Lungs: Clear to auscultation bilaterally. Heart: Regular rate and rhythm. No rubs, murmurs, or gallops. Abdomen: Soft, nontender, nondistended. No organomegaly noted, normoactive bowel sounds. Musculoskeletal: No edema, cyanosis, or clubbing. Neuro: Alert, answering all questions appropriately. Cranial nerves grossly intact. Skin: No rashes or petechiae noted. Psych: Normal affect.   LAB RESULTS:  Lab Results  Component Value Date   NA 133 (L) 12/28/2015   K 3.9 12/28/2015   CL 96 (L) 12/28/2015   CO2 28 12/28/2015   GLUCOSE 85 12/28/2015   BUN 32 (H) 12/28/2015   CREATININE 1.21 (H) 12/28/2015   CALCIUM 8.6 (L) 12/28/2015   PROT 7.2 12/28/2015   ALBUMIN 4.0 12/28/2015   AST 21 12/28/2015   ALT 15 12/28/2015   ALKPHOS 45 12/28/2015   BILITOT 0.6 12/28/2015   GFRNONAA 47 (L)  12/28/2015   GFRAA 55 (L) 12/28/2015    Lab Results  Component Value Date   WBC 7.3 11/13/2016   NEUTROABS 5.6 11/13/2016   HGB 10.5 (L) 11/13/2016   HCT 29.6 (L) 11/13/2016   MCV 85.7 11/13/2016   PLT 128 (L) 11/13/2016   Lab Results  Component Value Date   IRON 62 05/22/2016   TIBC 324 05/22/2016   IRONPCTSAT 19 05/22/2016    Lab Results  Component Value Date   FERRITIN 159 05/22/2016     STUDIES: b  No results found.  ASSESSMENT: Anemia in chronic kidney disease and thrombocytopenia:  PLAN:    1. Anemia in chronic kidney disease: Patient's hemoglobin Continues to be greater than 10.0. Her iron stores are also within normal limits. Previously, all of  her labwork was either negative or within normal limits. No intervention is needed at this time. After lengthy discussion with the patient, as agreed upon the no further follow-up is necessary. Please refer patient back if her hemoglobin remains persistently below 10.0. 2. Thrombocytopenia: Patient's platelet count remains decreased, but essentially unchanged. No bone marrow biopsy is needed at this time. Previously, all of her laboratory work was either negative or within normal limits. No follow-up necessary as above.  3. Chronic renal insufficiency: Mild, if patient's hemoglobin falls below 10.0 she may benefit from Procrit in the future.  Patient expressed understanding and was in agreement with this plan. She also understands that She can call clinic at any time with any questions, concerns, or complaints.   Lloyd Huger, MD   11/18/2016 3:35 PM

## 2016-11-13 ENCOUNTER — Inpatient Hospital Stay (HOSPITAL_BASED_OUTPATIENT_CLINIC_OR_DEPARTMENT_OTHER): Payer: Medicaid Other | Admitting: Oncology

## 2016-11-13 ENCOUNTER — Inpatient Hospital Stay: Payer: Medicaid Other | Attending: Oncology

## 2016-11-13 VITALS — BP 111/68 | HR 80 | Temp 97.7°F | Resp 20

## 2016-11-13 DIAGNOSIS — Z9981 Dependence on supplemental oxygen: Secondary | ICD-10-CM

## 2016-11-13 DIAGNOSIS — N189 Chronic kidney disease, unspecified: Secondary | ICD-10-CM | POA: Diagnosis not present

## 2016-11-13 DIAGNOSIS — M109 Gout, unspecified: Secondary | ICD-10-CM

## 2016-11-13 DIAGNOSIS — E1122 Type 2 diabetes mellitus with diabetic chronic kidney disease: Secondary | ICD-10-CM | POA: Insufficient documentation

## 2016-11-13 DIAGNOSIS — E039 Hypothyroidism, unspecified: Secondary | ICD-10-CM | POA: Diagnosis not present

## 2016-11-13 DIAGNOSIS — Z87891 Personal history of nicotine dependence: Secondary | ICD-10-CM | POA: Insufficient documentation

## 2016-11-13 DIAGNOSIS — R5382 Chronic fatigue, unspecified: Secondary | ICD-10-CM | POA: Diagnosis not present

## 2016-11-13 DIAGNOSIS — I872 Venous insufficiency (chronic) (peripheral): Secondary | ICD-10-CM | POA: Insufficient documentation

## 2016-11-13 DIAGNOSIS — Z79899 Other long term (current) drug therapy: Secondary | ICD-10-CM | POA: Diagnosis not present

## 2016-11-13 DIAGNOSIS — I130 Hypertensive heart and chronic kidney disease with heart failure and stage 1 through stage 4 chronic kidney disease, or unspecified chronic kidney disease: Secondary | ICD-10-CM | POA: Insufficient documentation

## 2016-11-13 DIAGNOSIS — G4733 Obstructive sleep apnea (adult) (pediatric): Secondary | ICD-10-CM | POA: Insufficient documentation

## 2016-11-13 DIAGNOSIS — D696 Thrombocytopenia, unspecified: Secondary | ICD-10-CM

## 2016-11-13 DIAGNOSIS — R5381 Other malaise: Secondary | ICD-10-CM

## 2016-11-13 DIAGNOSIS — I259 Chronic ischemic heart disease, unspecified: Secondary | ICD-10-CM | POA: Diagnosis not present

## 2016-11-13 DIAGNOSIS — J449 Chronic obstructive pulmonary disease, unspecified: Secondary | ICD-10-CM

## 2016-11-13 DIAGNOSIS — E782 Mixed hyperlipidemia: Secondary | ICD-10-CM

## 2016-11-13 DIAGNOSIS — Z794 Long term (current) use of insulin: Secondary | ICD-10-CM | POA: Insufficient documentation

## 2016-11-13 DIAGNOSIS — Z7982 Long term (current) use of aspirin: Secondary | ICD-10-CM | POA: Insufficient documentation

## 2016-11-13 DIAGNOSIS — D631 Anemia in chronic kidney disease: Secondary | ICD-10-CM | POA: Insufficient documentation

## 2016-11-13 DIAGNOSIS — R531 Weakness: Secondary | ICD-10-CM

## 2016-11-13 LAB — CBC WITH DIFFERENTIAL/PLATELET
BASOS ABS: 0 10*3/uL (ref 0–0.1)
BASOS PCT: 1 %
Eosinophils Absolute: 0 10*3/uL (ref 0–0.7)
Eosinophils Relative: 1 %
HEMATOCRIT: 29.6 % — AB (ref 35.0–47.0)
HEMOGLOBIN: 10.5 g/dL — AB (ref 12.0–16.0)
Lymphocytes Relative: 15 %
Lymphs Abs: 1.1 10*3/uL (ref 1.0–3.6)
MCH: 30.5 pg (ref 26.0–34.0)
MCHC: 35.5 g/dL (ref 32.0–36.0)
MCV: 85.7 fL (ref 80.0–100.0)
MONO ABS: 0.5 10*3/uL (ref 0.2–0.9)
Monocytes Relative: 6 %
NEUTROS ABS: 5.6 10*3/uL (ref 1.4–6.5)
Neutrophils Relative %: 77 %
Platelets: 128 10*3/uL — ABNORMAL LOW (ref 150–440)
RBC: 3.45 MIL/uL — ABNORMAL LOW (ref 3.80–5.20)
RDW: 15.2 % — ABNORMAL HIGH (ref 11.5–14.5)
WBC: 7.3 10*3/uL (ref 3.6–11.0)

## 2016-11-13 NOTE — Progress Notes (Signed)
Patient denies any concerns today.  

## 2017-02-21 ENCOUNTER — Other Ambulatory Visit: Payer: Self-pay | Admitting: Internal Medicine

## 2017-02-25 ENCOUNTER — Other Ambulatory Visit: Payer: Self-pay | Admitting: Internal Medicine

## 2017-02-25 DIAGNOSIS — D696 Thrombocytopenia, unspecified: Secondary | ICD-10-CM

## 2017-02-25 DIAGNOSIS — D649 Anemia, unspecified: Secondary | ICD-10-CM

## 2017-03-20 ENCOUNTER — Ambulatory Visit: Payer: Self-pay | Admitting: Internal Medicine

## 2017-03-25 ENCOUNTER — Other Ambulatory Visit: Payer: Self-pay

## 2017-03-26 ENCOUNTER — Other Ambulatory Visit: Payer: Self-pay | Admitting: Internal Medicine

## 2017-03-26 ENCOUNTER — Other Ambulatory Visit: Payer: Self-pay | Admitting: Nurse Practitioner

## 2017-03-26 ENCOUNTER — Other Ambulatory Visit: Payer: Self-pay

## 2017-03-26 DIAGNOSIS — D649 Anemia, unspecified: Secondary | ICD-10-CM

## 2017-03-26 DIAGNOSIS — D696 Thrombocytopenia, unspecified: Secondary | ICD-10-CM

## 2017-03-26 DIAGNOSIS — G894 Chronic pain syndrome: Secondary | ICD-10-CM

## 2017-03-26 MED ORDER — FLUTICASONE PROPIONATE 50 MCG/ACT NA SUSP
2.0000 | Freq: Every day | NASAL | 3 refills | Status: DC
Start: 2017-03-26 — End: 2017-07-22

## 2017-03-26 MED ORDER — ASPIRIN EC 325 MG PO TBEC: 325.0000 mg | DELAYED_RELEASE_TABLET | Freq: Every day | ORAL | 3 refills | Status: DC

## 2017-03-26 MED ORDER — AMITRIPTYLINE HCL 25 MG PO TABS: 50.0000 mg | ORAL_TABLET | Freq: Every day | ORAL | 3 refills | Status: DC

## 2017-03-26 MED ORDER — NYSTATIN 100000 UNIT/GM EX POWD: Freq: Every day | CUTANEOUS | 3 refills | Status: DC

## 2017-03-26 MED ORDER — DULOXETINE HCL 60 MG PO CPEP: 60.0000 mg | ORAL_CAPSULE | Freq: Two times a day (BID) | ORAL | 3 refills | Status: DC

## 2017-03-26 MED ORDER — NITROGLYCERIN 0.4 MG/SPRAY TL SOLN: 1.0000 | 3 refills | Status: DC | PRN

## 2017-03-26 MED ORDER — INSULIN PEN NEEDLE 32G X 4 MM MISC: 5 refills | Status: DC

## 2017-03-26 MED ORDER — FOLIC ACID 1 MG PO TABS: 1.0000 mg | ORAL_TABLET | Freq: Every day | ORAL | 3 refills | Status: DC

## 2017-03-26 MED ORDER — DICLOFENAC SODIUM 1 % TD GEL: 2.0000 g | Freq: Three times a day (TID) | TRANSDERMAL | 3 refills | Status: DC

## 2017-03-26 MED ORDER — ALLOPURINOL 100 MG PO TABS: 100.0000 mg | ORAL_TABLET | Freq: Every day | ORAL | 3 refills | Status: DC

## 2017-03-26 MED ORDER — OMEPRAZOLE 40 MG PO CPDR: 40.0000 mg | DELAYED_RELEASE_CAPSULE | Freq: Every day | ORAL | 3 refills | Status: DC

## 2017-03-26 MED ORDER — TRAMADOL HCL 50 MG PO TABS: 50.0000 mg | ORAL_TABLET | Freq: Two times a day (BID) | ORAL | 3 refills | Status: DC | PRN

## 2017-03-26 MED ORDER — FLUTICASONE-SALMETEROL 250-50 MCG/DOSE IN AEPB: 1.0000 | INHALATION_SPRAY | Freq: Two times a day (BID) | RESPIRATORY_TRACT | 3 refills | Status: DC

## 2017-03-26 MED ORDER — LORATADINE 10 MG PO TABS: 10.0000 mg | ORAL_TABLET | Freq: Every day | ORAL | 3 refills | Status: DC

## 2017-03-26 MED ORDER — ACCU-CHEK SOFTCLIX LANCETS MISC: 12 refills | Status: DC

## 2017-03-26 MED ORDER — GLUCOSE BLOOD VI STRP: ORAL_STRIP | 12 refills | Status: DC

## 2017-03-26 MED ORDER — BASAGLAR KWIKPEN 100 UNIT/ML ~~LOC~~ SOPN: PEN_INJECTOR | SUBCUTANEOUS | 3 refills | Status: DC

## 2017-03-26 MED ORDER — METOPROLOL SUCCINATE ER 25 MG PO TB24: 25.0000 mg | ORAL_TABLET | Freq: Every day | ORAL | 3 refills | Status: DC

## 2017-03-26 MED ORDER — INSULIN ASPART 100 UNIT/ML FLEXPEN: PEN_INJECTOR | SUBCUTANEOUS | 3 refills | Status: DC

## 2017-03-26 MED ORDER — IPRATROPIUM-ALBUTEROL 20-100 MCG/ACT IN AERS: 1.0000 | INHALATION_SPRAY | Freq: Four times a day (QID) | RESPIRATORY_TRACT | 3 refills | Status: DC

## 2017-03-26 MED ORDER — ISOSORBIDE MONONITRATE ER 30 MG PO TB24: 30.0000 mg | ORAL_TABLET | Freq: Every day | ORAL | 3 refills | Status: DC

## 2017-03-26 MED ORDER — SIMVASTATIN 80 MG PO TABS: 80.0000 mg | ORAL_TABLET | Freq: Every day | ORAL | 3 refills | Status: DC

## 2017-04-10 ENCOUNTER — Ambulatory Visit (INDEPENDENT_AMBULATORY_CARE_PROVIDER_SITE_OTHER): Payer: Medicaid Other | Admitting: Internal Medicine

## 2017-04-10 ENCOUNTER — Encounter: Payer: Self-pay | Admitting: Internal Medicine

## 2017-04-10 VITALS — BP 130/60 | HR 82 | Resp 16 | Ht 67.0 in | Wt 346.4 lb

## 2017-04-10 DIAGNOSIS — G4733 Obstructive sleep apnea (adult) (pediatric): Secondary | ICD-10-CM | POA: Diagnosis not present

## 2017-04-10 DIAGNOSIS — N189 Chronic kidney disease, unspecified: Secondary | ICD-10-CM

## 2017-04-10 DIAGNOSIS — Z9981 Dependence on supplemental oxygen: Secondary | ICD-10-CM

## 2017-04-10 DIAGNOSIS — J449 Chronic obstructive pulmonary disease, unspecified: Secondary | ICD-10-CM | POA: Diagnosis not present

## 2017-04-10 DIAGNOSIS — D631 Anemia in chronic kidney disease: Secondary | ICD-10-CM

## 2017-04-10 DIAGNOSIS — Z9989 Dependence on other enabling machines and devices: Secondary | ICD-10-CM | POA: Diagnosis not present

## 2017-04-10 NOTE — Progress Notes (Signed)
[facility] [address] [place], [place] [address]  Pulmonary Sleep Medicine  Office Visit Note  Patient Name: Deanna Rice DOB: [birth year] MRN [id number]  Date of Service: 04/10/2017  Complaints/HPI: She is doing well overall. She has been using the CPAP as prescribed. No recent downloads done on the device. Patient will bring it in to evaluate. Last PFT lookded good also no issues. She is on oxygen and is using 4lpm at this time and this has been helping her. She has had no admissions to the hospital. Follows with heme for anemia. She also has had issues with CKD  ROS  General: (-) fever, (-) chills, (-) night sweats, (-) weakness Skin: (-) rashes, (-) itching,. Eyes: (-) visual changes, (-) redness, (-) itching. Nose and Sinuses: (-) nasal stuffiness or itchiness, (-) postnasal drip, (-) nosebleeds, (-) sinus trouble. Mouth and Throat: (-) sore throat, (-) hoarseness. Neck: (-) swollen glands, (-) enlarged thyroid, (-) neck pain. Respiratory: - cough, (-) bloody sputum, + shortness of breath, - wheezing. Cardiovascular: + ankle swelling, (-) chest pain. Lymphatic: (-) lymph node enlargement. Neurologic: (-) numbness, (-) tingling. Psychiatric: (-) anxiety, (-) depression   Current Medication: Outpatient Encounter Medications as of 04/10/2017  Medication Sig  . ACCU-CHEK SOFTCLIX LANCETS lancets Use as instructed  . allopurinol (ZYLOPRIM) 100 MG tablet Take 1 tablet (100 mg total) by mouth daily.  Marland Kitchen amitriptyline (ELAVIL) 25 MG tablet Take 2 tablets (50 mg total) by mouth at bedtime.  Marland Kitchen aspirin EC 81 MG tablet Take 81 mg by mouth daily.  . diclofenac sodium (VOLTAREN) 1 % GEL Apply 2 g topically 3 (three) times daily.  Marland Kitchen docusate sodium (COLACE) 100 MG capsule Take 50 mg by mouth daily as needed. Reported on 06/28/2015  . DULoxetine (CYMBALTA) 60 MG capsule Take 1 capsule (60 mg total) by mouth 2 (two) times daily.  . fluticasone (FLONASE) 50 MCG/ACT nasal spray  Place 2 sprays into both nostrils daily.  . Fluticasone-Salmeterol (ADVAIR) 250-50 MCG/DOSE AEPB Inhale 1 puff into the lungs 2 (two) times daily.  . folic acid (FOLVITE) 1 MG tablet Take 1 tablet (1 mg total) by mouth daily.  Marland Kitchen glucose blood (ACCU-CHEK AVIVA PLUS) test strip Use as instructed  . insulin aspart (NOVOLOG FLEXPEN) 100 UNIT/ML FlexPen Use as directed with sliding scale maximum 63 units no more  . Insulin Glargine (BASAGLAR KWIKPEN) 100 UNIT/ML SOPN 42  Units in am, 18 units in pm  . Insulin Pen Needle (GLOBAL EASE INJECT PEN NEEDLES) 32G X 4 MM MISC Use  As directed  . Ipratropium-Albuterol (COMBIVENT RESPIMAT) 20-100 MCG/ACT AERS respimat Inhale 1 puff into the lungs every 6 (six) hours.  . isosorbide mononitrate (IMDUR) 30 MG 24 hr tablet Take 1 tablet (30 mg total) by mouth daily.  . Liraglutide (VICTOZA [place]) Inject 1.8 mg into the skin every morning.  . loratadine (CLARITIN) 10 MG tablet Take 1 tablet (10 mg total) by mouth daily.  . Melatonin 10 MG TABS Take 40 mg by mouth at bedtime.  . metoprolol succinate (TOPROL-XL) 25 MG 24 hr tablet Take 1 tablet (25 mg total) by mouth daily.  . Multiple Vitamins-Minerals (MULTIVITAMIN WITH MINERALS) tablet Take 1 tablet by mouth daily.  . nitroGLYCERIN (NITROLINGUAL) 0.4 MG/SPRAY spray Place 1 spray under the tongue every 5 (five) minutes x 3 doses as needed for chest pain.  Marland Kitchen nystatin (NYSTATIN) powder Apply topically 6 (six) times daily.  Marland Kitchen omeprazole (PRILOSEC) 40 MG capsule Take 1 capsule (  40 mg total) by mouth daily.  . simvastatin (ZOCOR) 80 MG tablet Take 1 tablet (80 mg total) by mouth daily at 6 PM.  . traMADol (ULTRAM) 50 MG tablet Take 1 tablet (50 mg total) by mouth every 12 (twelve) hours as needed.  Marland Kitchen VICTOZA 18 MG/3ML SOPN INJECT 1.8 MG EVERY MORNING   No facility-administered encounter medications on file as of 04/10/2017.     Surgical History: Past Surgical History:  Procedure Laterality Date  . TONSILLECTOMY       Medical History: Past Medical History:  Diagnosis Date  . Anemia, unspecified   . CHF (congestive heart failure) (HCC)   . Chronic ischemic heart disease, unspecified   . COPD (chronic obstructive pulmonary disease) (HCC)   . Dependence on supplemental oxygen   . Diabetes mellitus without complication (HCC)   . Gout, unspecified   . Hypertension   . Mixed hyperlipidemia   . 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 Obstructive sleep apnea syndrome   . Peripheral venous insufficiency   . Primary generalized hypertrophic osteoarthrosis   . Primary hypothyroidism   . Severe recurrent major depression with psychotic features (HCC)     Family History: Family History  Problem Relation Age of Onset  . Hypertension Mother   . Diabetes Mother   . CAD Mother   . CAD Father   . Diabetes Father   . Hypertension Father   . Hypertension Sister   . Diabetes Sister   . CAD Sister     Social History: Social History   Socioeconomic History  . Marital status: Single    Spouse name: Not on file  . Number of children: Not on file  . Years of education: Not on file  . Highest education level: Not on file  Social Needs  . Financial resource strain: Not on file  . Food insecurity - worry: Not on file  . Food insecurity - inability: Not on file  . Transportation needs - medical: Not on file  . Transportation needs - non-medical: Not on file  Occupational History  . Not on file  Tobacco Use  . Smoking status: Former Games developer  . Smokeless tobacco: Never Used  Substance and Sexual Activity  . Alcohol use: No  . Drug use: No  . Sexual activity: Not on file  Other Topics Concern  . Not on file  Social History Narrative  . Not on file    Vital Signs: Blood pressure 130/60, pulse 82, resp. rate 16, height 5\' 7"  (1.702 m), weight (!) 346 lb 6.4 oz (157.1 kg), SpO2 96 %.  Examination: General Appearance: The patient is well-developed, well-nourished, and in  no distress. Skin: Gross inspection of skin unremarkable. Head: normocephalic, no gross deformities. Eyes: no gross deformities noted. ENT: ears appear grossly normal no exudates. Neck: Supple. No thyromegaly. No LAD. Respiratory: no rhonchi noted at this time. Cardiovascular: Normal S1 and S2 without murmur or rub. Extremities: No cyanosis. pulses are equal. Neurologic: Alert and oriented. No involuntary movements.  LABS: No results found for this or any previous visit (from the past 2160 hour(s)).  Radiology: Dg Tibia/fibula Left  Result Date: 12/28/2015 CLINICAL DATA:  Bilateral lower lower leg ulcerations, acute onset. Initial encounter. EXAM: LEFT TIBIA AND FIBULA - 2 VIEW COMPARISON:  None. FINDINGS: There is chronic deformity about the distal femur and proximal tibia, likely reflecting remote injury. Some degree of bony remodeling is noted. No definite acute osseous erosions are identified. Skin thickening is  noted along the anterior aspect of the lower leg, with scattered soft tissue calcifications. Mild degenerative change is noted at the talar dome. IMPRESSION: 1. Chronic deformity about the distal femur and proximal tibia. No acute osseous erosions seen. 2. Skin thickening along the anterior aspect of the lower leg, with scattered soft tissue calcifications. Electronically Signed   By: Roanna RaiderJeffery  Chang M.D.   On: 12/28/2015 21:06   Dg Tibia/fibula Right  Result Date: 12/28/2015 CLINICAL DATA:  Bilateral lower leg ulcerations, acute onset. Initial encounter. EXAM: RIGHT TIBIA AND FIBULA - 2 VIEW COMPARISON:  None. FINDINGS: There is no evidence of osseous disruption. No osseous erosions are identified. Small marginal osteophytes are noted arising at the medial and lateral compartments. The ankle mortise is incompletely assessed, but grossly unremarkable in appearance. Scattered soft tissue calcifications are seen. A small posterior calcaneal spur is seen. IMPRESSION: No osseous erosions  seen. No evidence of fracture. Scattered soft tissue calcifications seen. Electronically Signed   By: Roanna RaiderJeffery  Chang M.D.   On: 12/28/2015 21:03    No results found.  No results found.    Assessment and Plan: Patient Active Problem List   Diagnosis Date Noted  . Thrombocytopenia (HCC) 09/28/2015  . Anemia in chronic kidney disease 09/28/2015  . Diarrhea 11/28/2014  . AKI (acute kidney injury) (HCC) 11/28/2014  . COPD (chronic obstructive pulmonary disease) (HCC) 11/28/2014  . DM (diabetes mellitus) (HCC) 11/28/2014  . HTN (hypertension) 11/28/2014    1. COPD stable good numbers on the last PFT will monitor. She is on inhalers and will continue 2. OSA uses her CPAP needs a download done 3. Morbid Obesity weight loss discussed 4. Anemia stable last Hgb OK 5. Oxygen dependent will contiue  General Counseling: I have discussed the findings of the evaluation and examination with Deanna Rice.  I have also discussed any further diagnostic evaluation thatmay be needed or ordered today. Deanna Rice verbalizes understanding of the findings of todays visit. We also reviewed her medications today and discussed drug interactions and side effects including but not limited excessive drowsiness and altered mental states. We also discussed that there is always a risk not just to her but also people around her. she has been encouraged to call the office with any questions or concerns that should arise related to todays visit.    Time spent: 15min  I have personally obtained a history, examined the patient, evaluated laboratory and imaging results, formulated the assessment and plan and placed orders.    Yevonne PaxSaadat A Rivka Baune, MD [facility] Pulmonary and Critical Care Sleep medicine

## 2017-04-10 NOTE — Patient Instructions (Signed)

## 2017-04-28 ENCOUNTER — Other Ambulatory Visit: Payer: Self-pay

## 2017-04-28 MED ORDER — ACCU-CHEK SOFTCLIX LANCETS MISC: 12 refills | Status: DC

## 2017-05-23 ENCOUNTER — Telehealth: Payer: Self-pay

## 2017-05-23 NOTE — Telephone Encounter (Signed)
CMN is signed and put in folder. 

## 2017-06-04 ENCOUNTER — Other Ambulatory Visit: Payer: Self-pay | Admitting: Internal Medicine

## 2017-06-04 MED ORDER — SILVER SULFADIAZINE 1 % EX CREA: 1.0000 "application " | TOPICAL_CREAM | Freq: Every day | CUTANEOUS | 3 refills | Status: DC

## 2017-06-05 ENCOUNTER — Other Ambulatory Visit: Payer: Self-pay

## 2017-06-05 MED ORDER — SILVER SULFADIAZINE 1 % EX CREA: 1.0000 "application " | TOPICAL_CREAM | Freq: Every day | CUTANEOUS | 3 refills | Status: DC

## 2017-06-16 ENCOUNTER — Telehealth: Payer: Self-pay | Admitting: Internal Medicine

## 2017-06-16 NOTE — Telephone Encounter (Signed)
PLAN OF CARE PUT INTO [facility] FOLDER TO BE PICKED UP.JW

## 2017-07-11 ENCOUNTER — Ambulatory Visit: Payer: Medicaid Other | Admitting: Nurse Practitioner

## 2017-07-11 ENCOUNTER — Encounter: Payer: Self-pay | Admitting: Nurse Practitioner

## 2017-07-11 VITALS — BP 133/63 | HR 73 | Resp 16 | Ht 67.0 in | Wt 350.0 lb

## 2017-07-11 DIAGNOSIS — G894 Chronic pain syndrome: Secondary | ICD-10-CM

## 2017-07-11 DIAGNOSIS — I10 Essential (primary) hypertension: Secondary | ICD-10-CM | POA: Diagnosis not present

## 2017-07-11 DIAGNOSIS — J449 Chronic obstructive pulmonary disease, unspecified: Secondary | ICD-10-CM | POA: Diagnosis not present

## 2017-07-11 DIAGNOSIS — E11649 Type 2 diabetes mellitus with hypoglycemia without coma: Secondary | ICD-10-CM | POA: Diagnosis not present

## 2017-07-11 DIAGNOSIS — D696 Thrombocytopenia, unspecified: Secondary | ICD-10-CM

## 2017-07-11 DIAGNOSIS — Z794 Long term (current) use of insulin: Secondary | ICD-10-CM | POA: Diagnosis not present

## 2017-07-11 DIAGNOSIS — Z9981 Dependence on supplemental oxygen: Secondary | ICD-10-CM | POA: Diagnosis not present

## 2017-07-11 DIAGNOSIS — E1165 Type 2 diabetes mellitus with hyperglycemia: Secondary | ICD-10-CM

## 2017-07-11 LAB — POCT GLYCOSYLATED HEMOGLOBIN (HGB A1C): HEMOGLOBIN A1C: 8.5

## 2017-07-11 MED ORDER — BASAGLAR KWIKPEN 100 UNIT/ML ~~LOC~~ SOPN: PEN_INJECTOR | SUBCUTANEOUS | 3 refills | Status: DC

## 2017-07-11 MED ORDER — TRAMADOL HCL 50 MG PO TABS
50.0000 mg | ORAL_TABLET | Freq: Two times a day (BID) | ORAL | 3 refills | Status: DC | PRN
Start: 2017-07-11 — End: 2017-11-27

## 2017-07-11 NOTE — Progress Notes (Signed)
[facility] [address] [place], [place] [address]  Internal MEDICINE  Office Visit Note  Patient Name: Deanna Rice  [id number]  [id number]  Date of Service: 08/03/2017   Pt is here for routine follow up.   Chief Complaint  Patient presents with  . Diabetes  . Osteoarthritis    The patient needs to have refills for her tramadol. She takes this for chronic pain in her low back and left leg. Pain is an aching, throbbing type pain, like a toothache. Rates this as 7/10 in severity. When she takes tramadol, this lowers her pain to 3/10 in severity and she is able to participate on normal daily activities. She reports no negative side effects from the tramadol.   Diabetes  She presents for her follow-up diabetic visit. She has type 2 diabetes mellitus. MedicAlert identification noted. Her disease course has been worsening. Hypoglycemia symptoms include nervousness/anxiousness. Pertinent negatives for hypoglycemia include no dizziness, headaches or tremors. Associated symptoms include fatigue, foot paresthesias, foot ulcerations and weakness. Pertinent negatives for diabetes include no chest pain. There are no hypoglycemic complications. Symptoms are worsening. Diabetic complications include nephropathy and peripheral neuropathy. Risk factors for coronary artery disease include diabetes mellitus, dyslipidemia, hypertension and obesity. Current diabetic treatment includes insulin injections. She is compliant with treatment most of the time. Her weight is stable. She is following a generally healthy diet. Meal planning includes avoidance of concentrated sweets. She has not had a previous visit with a dietitian. She rarely participates in exercise. Her home blood glucose trend is increasing steadily. An ACE inhibitor/angiotensin II receptor blocker is not being taken. She does not see a podiatrist.Eye exam is not current.       Current Medication: Outpatient Encounter Medications as  of 07/11/2017  Medication Sig  . ACCU-CHEK SOFTCLIX LANCETS lancets Use as instructed  . aspirin EC 81 MG tablet Take 81 mg by mouth daily.  . diclofenac sodium (VOLTAREN) 1 % GEL Apply 2 g topically 3 (three) times daily.  Marland Kitchen docusate sodium (COLACE) 100 MG capsule Take 50 mg by mouth daily as needed. Reported on 06/28/2015  . glucose blood (ACCU-CHEK AVIVA PLUS) test strip Use as instructed  . insulin aspart (NOVOLOG FLEXPEN) 100 UNIT/ML FlexPen Use as directed with sliding scale maximum 63 units no more  . Insulin Glargine (BASAGLAR KWIKPEN) 100 UNIT/ML SOPN Inject 45 units [place] QD. (Patient taking differently: Inject 45 units [place] QD and 18 units at bedtime)  . Insulin Pen Needle (GLOBAL EASE INJECT PEN NEEDLES) 32G X 4 MM MISC Use  As directed  . Liraglutide (VICTOZA [place]) Inject 1.8 mg into the skin every morning.  . Melatonin 10 MG TABS Take 40 mg by mouth at bedtime.  . Multiple Vitamins-Minerals (MULTIVITAMIN WITH MINERALS) tablet Take 1 tablet by mouth daily.  . nitroGLYCERIN (NITROLINGUAL) 0.4 MG/SPRAY spray Place 1 spray under the tongue every 5 (five) minutes x 3 doses as needed for chest pain.  . silver sulfADIAZINE (SILVADENE) 1 % cream Apply 1 application topically daily.  . traMADol (ULTRAM) 50 MG tablet Take 1 tablet (50 mg total) by mouth every 12 (twelve) hours as needed for moderate pain.  . [DISCONTINUED] allopurinol (ZYLOPRIM) 100 MG tablet Take 1 tablet (100 mg total) by mouth daily.  . [DISCONTINUED] amitriptyline (ELAVIL) 25 MG tablet Take 2 tablets (50 mg total) by mouth at bedtime.  . [DISCONTINUED] DULoxetine (CYMBALTA) 60 MG capsule Take 1 capsule (60 mg total) by mouth 2 (two) times daily.  . [  DISCONTINUED] fluticasone (FLONASE) 50 MCG/ACT nasal spray Place 2 sprays into both nostrils daily.  . [DISCONTINUED] Fluticasone-Salmeterol (ADVAIR) 250-50 MCG/DOSE AEPB Inhale 1 puff into the lungs 2 (two) times daily.  . [DISCONTINUED] folic acid (FOLVITE) 1 MG tablet Take 1 tablet  (1 mg total) by mouth daily.  . [DISCONTINUED] Insulin Glargine (BASAGLAR KWIKPEN) 100 UNIT/ML SOPN 42  Units in am, 18 units in pm  . [DISCONTINUED] Ipratropium-Albuterol (COMBIVENT RESPIMAT) 20-100 MCG/ACT AERS respimat Inhale 1 puff into the lungs every 6 (six) hours.  . [DISCONTINUED] isosorbide mononitrate (IMDUR) 30 MG 24 hr tablet Take 1 tablet (30 mg total) by mouth daily.  . [DISCONTINUED] loratadine (CLARITIN) 10 MG tablet Take 1 tablet (10 mg total) by mouth daily.  . [DISCONTINUED] metoprolol succinate (TOPROL-XL) 25 MG 24 hr tablet Take 1 tablet (25 mg total) by mouth daily.  . [DISCONTINUED] nystatin (NYSTATIN) powder Apply topically 6 (six) times daily.  . [DISCONTINUED] omeprazole (PRILOSEC) 40 MG capsule Take 1 capsule (40 mg total) by mouth daily.  . [DISCONTINUED] simvastatin (ZOCOR) 80 MG tablet Take 1 tablet (80 mg total) by mouth daily at 6 PM.  . [DISCONTINUED] traMADol (ULTRAM) 50 MG tablet Take 1 tablet (50 mg total) by mouth every 12 (twelve) hours as needed.  . [DISCONTINUED] VICTOZA 18 MG/3ML SOPN INJECT 1.8 MG EVERY MORNING   No facility-administered encounter medications on file as of 07/11/2017.     Surgical History: Past Surgical History:  Procedure Laterality Date  . TONSILLECTOMY      Medical History: Past Medical History:  Diagnosis Date  . Anemia, unspecified   . CHF (congestive heart failure) (HCC)   . Chronic ischemic heart disease, unspecified   . COPD (chronic obstructive pulmonary disease) (HCC)   . Dependence on supplemental oxygen   . Diabetes mellitus without complication (HCC)   . Gout, unspecified   . Hypertension   . Mixed hyperlipidemia   . 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 Obstructive sleep apnea syndrome   . Peripheral venous insufficiency   . Primary generalized hypertrophic osteoarthrosis   . Primary hypothyroidism   . Severe recurrent major depression with psychotic features (HCC)     Family  History: Family History  Problem Relation Age of Onset  . Hypertension Mother   . Diabetes Mother   . CAD Mother   . CAD Father   . Diabetes Father   . Hypertension Father   . Hypertension Sister   . Diabetes Sister   . CAD Sister     Social History   Socioeconomic History  . Marital status: Single    Spouse name: Not on file  . Number of children: Not on file  . Years of education: Not on file  . Highest education level: Not on file  Occupational History  . Not on file  Social Needs  . Financial resource strain: Not on file  . Food insecurity:    Worry: Not on file    Inability: Not on file  . Transportation needs:    Medical: Not on file    Non-medical: Not on file  Tobacco Use  . Smoking status: Former Games developer  . Smokeless tobacco: Never Used  Substance and Sexual Activity  . Alcohol use: No  . Drug use: No  . Sexual activity: Not on file  Lifestyle  . Physical activity:    Days per week: Not on file    Minutes per session: Not on file  . Stress: Not on  file  Relationships  . Social connections:    Talks on phone: Not on file    Gets together: Not on file    Attends religious service: Not on file    Active member of club or organization: Not on file    Attends meetings of clubs or organizations: Not on file    Relationship status: Not on file  . Intimate partner violence:    Fear of current or ex partner: Not on file    Emotionally abused: Not on file    Physically abused: Not on file    Forced sexual activity: Not on file  Other Topics Concern  . Not on file  Social History Narrative  . Not on file      Review of Systems  Constitutional: Positive for fatigue. Negative for activity change, chills and unexpected weight change.  HENT: Negative for congestion, postnasal drip, rhinorrhea, sneezing and sore throat.   Eyes: Negative.  Negative for redness.  Respiratory: Positive for shortness of breath and wheezing. Negative for cough and chest  tightness.   Cardiovascular: Negative for chest pain and palpitations.  Gastrointestinal: Negative for abdominal pain, constipation, diarrhea, nausea and vomiting.  Endocrine:       Blood sugars running high.   Genitourinary: Negative for dysuria and frequency.  Musculoskeletal: Positive for arthralgias and back pain. Negative for joint swelling and neck pain.       Chronic back and hip and knee pain.   Skin: Negative for rash.  Allergic/Immunologic: Positive for environmental allergies.  Neurological: Positive for weakness. Negative for dizziness, tremors, numbness and headaches.  Hematological: Negative for adenopathy. Does not bruise/bleed easily.  Psychiatric/Behavioral: Positive for dysphoric mood. Negative for behavioral problems (Depression), sleep disturbance and suicidal ideas. The patient is nervous/anxious.        Well controlled with current medication.    Today's Vitals   07/11/17 0902  BP: 133/63  Pulse: 73  Resp: 16  SpO2: 93%  Weight: (!) 350 lb (158.8 kg)  Height:  (1.702 m)    Physical Exam  Constitutional: She is oriented to person, place, and time. She appears well-developed and well-nourished. No distress.  HENT:  Head: Normocephalic and atraumatic.  Mouth/Throat: Oropharynx is clear and moist. No oropharyngeal exudate.  Eyes: Pupils are equal, round, and reactive to light. Conjunctivae and EOM are normal.  Neck: Normal range of motion. Neck supple. No JVD present. No tracheal deviation present. No thyromegaly present.  Cardiovascular: Normal rate, regular rhythm and normal heart sounds. Exam reveals no gallop and no friction rub.  No murmur heard. Pulmonary/Chest: Effort normal. No respiratory distress. She has no wheezes. She has no rales. She exhibits no tenderness.  Patient using nasal cannula oxygen at 4-5lpm at all times.   Abdominal: Soft. Bowel sounds are normal. There is no tenderness.  Musculoskeletal: Normal range of motion.  Bilateral hip  and knee pain. Described as 7/10 in severity. No joint abnormalities or deformities present.   Lymphadenopathy:    She has no cervical adenopathy.  Neurological: She is alert and oriented to person, place, and time. No cranial nerve deficit.  Skin: Skin is warm and dry. She is not diaphoretic.  Psychiatric: She has a normal mood and affect. Her behavior is normal. Judgment and thought content normal.  Nursing note and vitals reviewed.  Assessment/Plan: 1. Controlled type 2 diabetes mellitus with hyperglycemia, with long-term current use of insulin (HCC) - POCT HgB A1C 8.5 today. Increased Basaglar to 45 units every  day. Continue other diabetic medication as prescribed.  - Insulin Glargine (BASAGLAR KWIKPEN) 100 UNIT/ML SOPN; Inject 45 units [place] QD. (Patient taking differently: Inject 45 units [place] QD and 18 units at bedtime)  Dispense: 5 pen; Refill: 3  2. Thrombocytopenia (HCC) Check labs and refer out as indicated.    3. Chronic pain syndrome Ok tto continue tramadol  twice daily if needed for pain. New prescription provided today.  - traMADol (ULTRAM) 50 MG tablet; Take 1 tablet (50 mg total) by mouth every 12 (twelve) hours as needed for moderate pain.  Dispense: 60 tablet; Refill: 3  4. Essential hypertension Stable. Continue bp medication as prescribed.   5. Chronic obstructive pulmonary disease, unspecified COPD type (HCC) Continue inhalers and respiratory medications as prescribed.   6. Oxygen dependent Continue to use nasal cannula oxygen as prescribed.   General Counseling: zimal weisensel understanding of the findings of todays visit and agrees with plan of treatment. I have discussed any further diagnostic evaluation that may be needed or ordered today. We also reviewed her medications today. she has been encouraged to call the office with any questions or concerns that should arise related to todays visit.  Counseling:  Diabetes Counseling:  1. Addition of ACE inh/  ARB'S for nephroprotection. 2. Diabetic foot care, prevention of complications.  3.Exercise and lose weight.  4. Diabetic eye examination, 5. Monitor blood sugar closlely. nutrition counseling.  6.Sign and symptoms of hypoglycemia including shaking sweating,confusion and headaches.  This patient was seen by Vincent Gros, FNP- C in Collaboration with Dr Lyndon Code as a part of collaborative care agreement    Orders Placed This Encounter  Procedures  . POCT HgB A1C    Meds ordered this encounter  Medications  . Insulin Glargine (BASAGLAR KWIKPEN) 100 UNIT/ML SOPN    Sig: Inject 45 units [place] QD.    Dispense:  5 pen    Refill:  3    Please note increased pharmacy.    Order Specific Question:   Supervising Provider    Answer:   Lyndon Code [[id number]]  . traMADol (ULTRAM) 50 MG tablet    Sig: Take 1 tablet (50 mg total) by mouth every 12 (twelve) hours as needed for moderate pain.    Dispense:  60 tablet    Refill:  3    Patient reassessed today for chronic pain in back and left leg.    Order Specific Question:   Supervising Provider    Answer:   Lyndon Code [[id number]]    Time spent: 27 Minutes     Dr Lyndon Code Internal medicine

## 2017-07-22 ENCOUNTER — Other Ambulatory Visit: Payer: Self-pay

## 2017-07-22 DIAGNOSIS — D649 Anemia, unspecified: Secondary | ICD-10-CM

## 2017-07-22 DIAGNOSIS — D696 Thrombocytopenia, unspecified: Secondary | ICD-10-CM

## 2017-07-22 MED ORDER — AMITRIPTYLINE HCL 25 MG PO TABS: 50.0000 mg | ORAL_TABLET | Freq: Every day | ORAL | 3 refills | Status: DC

## 2017-07-22 MED ORDER — METOPROLOL SUCCINATE ER 25 MG PO TB24: 25.0000 mg | ORAL_TABLET | Freq: Every day | ORAL | 3 refills | Status: DC

## 2017-07-22 MED ORDER — LORATADINE 10 MG PO TABS: 10.0000 mg | ORAL_TABLET | Freq: Every day | ORAL | 3 refills | Status: DC

## 2017-07-22 MED ORDER — ALLOPURINOL 100 MG PO TABS: 100.0000 mg | ORAL_TABLET | Freq: Every day | ORAL | 3 refills | Status: DC

## 2017-07-22 MED ORDER — OMEPRAZOLE 40 MG PO CPDR: 40.0000 mg | DELAYED_RELEASE_CAPSULE | Freq: Every day | ORAL | 3 refills | Status: DC

## 2017-07-22 MED ORDER — SIMVASTATIN 80 MG PO TABS: 80.0000 mg | ORAL_TABLET | Freq: Every day | ORAL | 3 refills | Status: DC

## 2017-07-22 MED ORDER — NYSTATIN 100000 UNIT/GM EX POWD: Freq: Every day | CUTANEOUS | 3 refills | Status: DC

## 2017-07-22 MED ORDER — DULOXETINE HCL 60 MG PO CPEP: 60.0000 mg | ORAL_CAPSULE | Freq: Two times a day (BID) | ORAL | 3 refills | Status: DC

## 2017-07-22 MED ORDER — FLUTICASONE-SALMETEROL 250-50 MCG/DOSE IN AEPB: 1.0000 | INHALATION_SPRAY | Freq: Two times a day (BID) | RESPIRATORY_TRACT | 3 refills | Status: DC

## 2017-07-22 MED ORDER — ISOSORBIDE MONONITRATE ER 30 MG PO TB24: 30.0000 mg | ORAL_TABLET | Freq: Every day | ORAL | 3 refills | Status: DC

## 2017-07-22 MED ORDER — IPRATROPIUM-ALBUTEROL 20-100 MCG/ACT IN AERS: 1.0000 | INHALATION_SPRAY | Freq: Four times a day (QID) | RESPIRATORY_TRACT | 3 refills | Status: DC

## 2017-07-22 MED ORDER — FLUTICASONE PROPIONATE 50 MCG/ACT NA SUSP: 2.0000 | Freq: Every day | NASAL | 3 refills | Status: DC

## 2017-07-22 MED ORDER — FOLIC ACID 1 MG PO TABS: 1.0000 mg | ORAL_TABLET | Freq: Every day | ORAL | 3 refills | Status: DC

## 2017-07-23 ENCOUNTER — Other Ambulatory Visit: Payer: Self-pay | Admitting: Internal Medicine

## 2017-07-23 MED ORDER — LIRAGLUTIDE 18 MG/3ML ~~LOC~~ SOPN: PEN_INJECTOR | SUBCUTANEOUS | 3 refills | Status: DC

## 2017-08-03 DIAGNOSIS — Z9981 Dependence on supplemental oxygen: Secondary | ICD-10-CM | POA: Insufficient documentation

## 2017-08-03 DIAGNOSIS — G894 Chronic pain syndrome: Secondary | ICD-10-CM | POA: Insufficient documentation

## 2017-09-08 ENCOUNTER — Telehealth: Payer: Self-pay | Admitting: Internal Medicine

## 2017-09-08 ENCOUNTER — Other Ambulatory Visit: Payer: Self-pay | Admitting: Nurse Practitioner

## 2017-09-08 MED ORDER — PHENDIMETRAZINE TARTRATE 35 MG PO TABS: 35.0000 mg | ORAL_TABLET | Freq: Every day | ORAL | 1 refills | Status: DC

## 2017-09-08 NOTE — Progress Notes (Signed)
Renewed bontril 35mg . May take twice daily, prior to meals. Will refill at next visit.

## 2017-09-08 NOTE — Telephone Encounter (Signed)
Renewed bontril 35mg. May take twice daily, prior to meals. Will refill at next visit.   

## 2017-09-09 NOTE — Telephone Encounter (Signed)
Pt informed of medication called in , pt is aware that she is paying out of pocket for medication .

## 2017-09-17 ENCOUNTER — Other Ambulatory Visit: Payer: Self-pay | Admitting: Nurse Practitioner

## 2017-09-17 ENCOUNTER — Telehealth: Payer: Self-pay

## 2017-09-17 MED ORDER — PHENDIMETRAZINE TARTRATE 35 MG PO TABS: ORAL_TABLET | ORAL | 1 refills | Status: DC

## 2017-09-17 NOTE — Telephone Encounter (Signed)
That was done by mistake. I redid the prescription for twice daily and sent it to her pharmacy. She can go ahead and start back with twice daily dosing.

## 2017-09-18 NOTE — Telephone Encounter (Signed)
Informed pt to return to regular routine with twice daily for medication. And also told her that her prescription was sent to her pharmacy

## 2017-09-22 ENCOUNTER — Other Ambulatory Visit: Payer: Self-pay | Admitting: Nurse Practitioner

## 2017-09-22 ENCOUNTER — Other Ambulatory Visit: Payer: Self-pay

## 2017-09-22 DIAGNOSIS — D696 Thrombocytopenia, unspecified: Secondary | ICD-10-CM

## 2017-09-22 MED ORDER — BASAGLAR KWIKPEN 100 UNIT/ML ~~LOC~~ SOPN
PEN_INJECTOR | SUBCUTANEOUS | 3 refills | Status: DC
Start: 2017-09-22 — End: 2018-06-24

## 2017-09-22 MED ORDER — BASAGLAR KWIKPEN 100 UNIT/ML ~~LOC~~ SOPN: PEN_INJECTOR | SUBCUTANEOUS | 3 refills | Status: DC

## 2017-10-13 ENCOUNTER — Ambulatory Visit: Payer: Self-pay | Admitting: Internal Medicine

## 2017-10-15 ENCOUNTER — Telehealth: Payer: Self-pay

## 2017-10-15 NOTE — Telephone Encounter (Signed)
Gave signed home health certification and plan of care for skilled nursing and medication management to [facility]

## 2017-10-16 ENCOUNTER — Ambulatory Visit: Payer: Self-pay | Admitting: Nurse Practitioner

## 2017-10-24 ENCOUNTER — Other Ambulatory Visit: Payer: Self-pay

## 2017-10-24 MED ORDER — INSULIN PEN NEEDLE 32G X 4 MM MISC: 5 refills | Status: DC

## 2017-11-12 ENCOUNTER — Telehealth: Payer: Self-pay

## 2017-11-12 NOTE — Telephone Encounter (Signed)
[phone]) called that pt sugar is going up and down 400 to 500 and then go down and also pt eating cake,donuts as per heather advised nurse if sugar 500 she need go to ER or she need to been seen

## 2017-11-14 ENCOUNTER — Encounter: Payer: Self-pay | Admitting: Adult Health

## 2017-11-14 ENCOUNTER — Ambulatory Visit: Payer: Medicaid Other | Admitting: Adult Health

## 2017-11-14 VITALS — BP 132/72 | HR 70 | Temp 98.5°F | Resp 16 | Ht 67.0 in | Wt 360.0 lb

## 2017-11-14 DIAGNOSIS — Z23 Encounter for immunization: Secondary | ICD-10-CM | POA: Diagnosis not present

## 2017-11-14 DIAGNOSIS — E1165 Type 2 diabetes mellitus with hyperglycemia: Secondary | ICD-10-CM

## 2017-11-14 LAB — POCT GLYCOSYLATED HEMOGLOBIN (HGB A1C): HEMOGLOBIN A1C: 8.4 % — AB (ref 4.0–5.6)

## 2017-11-14 MED ORDER — GLUCOSE BLOOD VI STRP: ORAL_STRIP | 3 refills | Status: DC

## 2017-11-14 NOTE — Patient Instructions (Signed)
Diabetes Mellitus and Nutrition When you have diabetes (diabetes mellitus), it is very important to have healthy eating habits because your blood sugar (glucose) levels are greatly affected by what you eat and drink. Eating healthy foods in the appropriate amounts, at about the same times every day, can help you:  Control your blood glucose.  Lower your risk of heart disease.  Improve your blood pressure.  Reach or maintain a healthy weight.  Every person with diabetes is different, and each person has different needs for a meal plan. Your health care provider may recommend that you work with a diet and nutrition specialist (dietitian) to make a meal plan that is best for you. Your meal plan may vary depending on factors such as:  The calories you need.  The medicines you take.  Your weight.  Your blood glucose, blood pressure, and cholesterol levels.  Your activity level.  Other health conditions you have, such as heart or kidney disease.  How do carbohydrates affect me? Carbohydrates affect your blood glucose level more than any other type of food. Eating carbohydrates naturally increases the amount of glucose in your blood. Carbohydrate counting is a method for keeping track of how many carbohydrates you eat. Counting carbohydrates is important to keep your blood glucose at a healthy level, especially if you use insulin or take certain oral diabetes medicines. It is important to know how many carbohydrates you can safely have in each meal. This is different for every person. Your dietitian can help you calculate how many carbohydrates you should have at each meal and for snack. Foods that contain carbohydrates include:  Bread, cereal, rice, pasta, and crackers.  Potatoes and corn.  Peas, beans, and lentils.  Milk and yogurt.  Fruit and juice.  Desserts, such as cakes, cookies, ice cream, and candy.  How does alcohol affect me? Alcohol can cause a sudden decrease in blood  glucose (hypoglycemia), especially if you use insulin or take certain oral diabetes medicines. Hypoglycemia can be a life-threatening condition. Symptoms of hypoglycemia (sleepiness, dizziness, and confusion) are similar to symptoms of having too much alcohol. If your health care provider says that alcohol is safe for you, follow these guidelines:  Limit alcohol intake to no more than 1 drink per day for nonpregnant women and 2 drinks per day for men. One drink equals 12 oz of beer, 5 oz of wine, or 1 oz of hard liquor.  Do not drink on an empty stomach.  Keep yourself hydrated with water, diet soda, or unsweetened iced tea.  Keep in mind that regular soda, juice, and other mixers may contain a lot of sugar and must be counted as carbohydrates.  What are tips for following this plan? Reading food labels  Start by checking the serving size on the label. The amount of calories, carbohydrates, fats, and other nutrients listed on the label are based on one serving of the food. Many foods contain more than one serving per package.  Check the total grams (g) of carbohydrates in one serving. You can calculate the number of servings of carbohydrates in one serving by dividing the total carbohydrates by 15. For example, if a food has 30 g of total carbohydrates, it would be equal to 2 servings of carbohydrates.  Check the number of grams (g) of saturated and trans fats in one serving. Choose foods that have low or no amount of these fats.  Check the number of milligrams (mg) of sodium in one serving. Most people   should limit total sodium intake to less than 2,300 mg per day.  Always check the nutrition information of foods labeled as "low-fat" or "nonfat". These foods may be higher in added sugar or refined carbohydrates and should be avoided.  Talk to your dietitian to identify your daily goals for nutrients listed on the label. Shopping  Avoid buying canned, premade, or processed foods. These  foods tend to be high in fat, sodium, and added sugar.  Shop around the outside edge of the grocery store. This includes fresh fruits and vegetables, bulk grains, fresh meats, and fresh dairy. Cooking  Use low-heat cooking methods, such as baking, instead of high-heat cooking methods like deep frying.  Cook using healthy oils, such as olive, canola, or sunflower oil.  Avoid cooking with butter, cream, or high-fat meats. Meal planning  Eat meals and snacks regularly, preferably at the same times every day. Avoid going long periods of time without eating.  Eat foods high in fiber, such as fresh fruits, vegetables, beans, and whole grains. Talk to your dietitian about how many servings of carbohydrates you can eat at each meal.  Eat 4-6 ounces of lean protein each day, such as lean meat, chicken, fish, eggs, or tofu. 1 ounce is equal to 1 ounce of meat, chicken, or fish, 1 egg, or 1/4 cup of tofu.  Eat some foods each day that contain healthy fats, such as avocado, nuts, seeds, and fish. Lifestyle   Check your blood glucose regularly.  Exercise at least 30 minutes 5 or more days each week, or as told by your health care provider.  Take medicines as told by your health care provider.  Do not use any products that contain nicotine or tobacco, such as cigarettes and e-cigarettes. If you need help quitting, ask your health care provider.  Work with a counselor or diabetes educator to identify strategies to manage stress and any emotional and social challenges. What are some questions to ask my health care provider?  Do I need to meet with a diabetes educator?  Do I need to meet with a dietitian?  What number can I call if I have questions?  When are the best times to check my blood glucose? Where to find more information:  American Diabetes Association: diabetes.org/food-and-fitness/food  Academy of Nutrition and Dietetics:  www.eatright.org/resources/health/diseases-and-conditions/diabetes  National Institute of Diabetes and Digestive and Kidney Diseases (NIH): www.niddk.nih.gov/health-information/diabetes/overview/diet-eating-physical-activity Summary  A healthy meal plan will help you control your blood glucose and maintain a healthy lifestyle.  Working with a diet and nutrition specialist (dietitian) can help you make a meal plan that is best for you.  Keep in mind that carbohydrates and alcohol have immediate effects on your blood glucose levels. It is important to count carbohydrates and to use alcohol carefully. This information is not intended to replace advice given to you by your health care provider. Make sure you discuss any questions you have with your health care provider. Document Released: 11/08/2004 Document Revised: 03/18/2016 Document Reviewed: 03/18/2016 Elsevier Interactive Patient Education  2018 Elsevier Inc.  

## 2017-11-14 NOTE — Progress Notes (Signed)
[facility] [address] [place], [place] [address]  Internal MEDICINE  Office Visit Note  Patient Name: Deanna Rice  [id number]  [id number]  Date of Service: 11/20/2017  Chief Complaint  Patient presents with  . Diabetes    bs running high a1c 8.4   HPI Pt is here for a sick visit. She reports sugars have been increasing.  She has been eating a lot of things that she is not suppose to be eating lately. Also, the amount of food she has been consuming has greatly increased.  She does not wish to go on more medications at this time.  She has a home health aid who is fixing her meals and is helping her with her diet.  She also has a Charity fundraiser who comes every two weeks.   Current Medication:  Outpatient Encounter Medications as of 11/14/2017  Medication Sig  . ACCU-CHEK SOFTCLIX LANCETS lancets Use as instructed  . allopurinol (ZYLOPRIM) 100 MG tablet Take 1 tablet (100 mg total) by mouth daily.  Marland Kitchen amitriptyline (ELAVIL) 25 MG tablet Take 2 tablets (50 mg total) by mouth at bedtime.  Marland Kitchen aspirin EC 81 MG tablet Take 81 mg by mouth daily.  . diclofenac sodium (VOLTAREN) 1 % GEL Apply 2 g topically 3 (three) times daily.  Marland Kitchen docusate sodium (COLACE) 100 MG capsule Take 50 mg by mouth daily as needed. Reported on 06/28/2015  . DULoxetine (CYMBALTA) 60 MG capsule Take 1 capsule (60 mg total) by mouth 2 (two) times daily.  . fluticasone (FLONASE) 50 MCG/ACT nasal spray Place 2 sprays into both nostrils daily.  . Fluticasone-Salmeterol (ADVAIR) 250-50 MCG/DOSE AEPB Inhale 1 puff into the lungs 2 (two) times daily.  . folic acid (FOLVITE) 1 MG tablet Take 1 tablet (1 mg total) by mouth daily.  Marland Kitchen glucose blood (ACCU-CHEK AVIVA PLUS) test strip Use as instructed  . insulin aspart (NOVOLOG FLEXPEN) 100 UNIT/ML FlexPen Use as directed with sliding scale maximum 63 units no more  . Insulin Glargine (BASAGLAR KWIKPEN) 100 UNIT/ML SOPN Inject 45 units [place] QD and 18 units at bedtime  . Insulin Pen  Needle (GLOBAL EASE INJECT PEN NEEDLES) 32G X 4 MM MISC Use  As directed  . Ipratropium-Albuterol (COMBIVENT RESPIMAT) 20-100 MCG/ACT AERS respimat Inhale 1 puff into the lungs every 6 (six) hours.  . isosorbide mononitrate (IMDUR) 30 MG 24 hr tablet Take 1 tablet (30 mg total) by mouth daily.  . Liraglutide (VICTOZA [place]) Inject 1.8 mg into the skin every morning.  . liraglutide (VICTOZA) 18 MG/3ML SOPN INJECT 1.8 MG EVERY MORNING  . loratadine (CLARITIN) 10 MG tablet Take 1 tablet (10 mg total) by mouth daily.  . Melatonin 10 MG TABS Take 40 mg by mouth at bedtime.  . metoprolol succinate (TOPROL-XL) 25 MG 24 hr tablet Take 1 tablet (25 mg total) by mouth daily.  . Multiple Vitamins-Minerals (MULTIVITAMIN WITH MINERALS) tablet Take 1 tablet by mouth daily.  . nitroGLYCERIN (NITROLINGUAL) 0.4 MG/SPRAY spray Place 1 spray under the tongue every 5 (five) minutes x 3 doses as needed for chest pain.  Marland Kitchen nystatin (NYSTATIN) powder Apply topically 6 (six) times daily.  Marland Kitchen omeprazole (PRILOSEC) 40 MG capsule Take 1 capsule (40 mg total) by mouth daily.  . OXYGEN Inhale into the lungs. Inhale 4 liters into lungs continously  . Phendimetrazine Tartrate 35 MG TABS Take 1 tablet po bid prior to meals.  . silver sulfADIAZINE (SILVADENE) 1 % cream Apply 1 application topically daily.  Marland Kitchen  simvastatin (ZOCOR) 80 MG tablet Take 1 tablet (80 mg total) by mouth daily at 6 PM.  . traMADol (ULTRAM) 50 MG tablet Take 1 tablet (50 mg total) by mouth every 12 (twelve) hours as needed for moderate pain.  . [DISCONTINUED] glucose blood (ACCU-CHEK AVIVA PLUS) test strip Use as instructed   No facility-administered encounter medications on file as of 11/14/2017.       Medical History: Past Medical History:  Diagnosis Date  . Anemia, unspecified   . CHF (congestive heart failure) (HCC)   . Chronic ischemic heart disease, unspecified   . COPD (chronic obstructive pulmonary disease) (HCC)   . Dependence on supplemental  oxygen   . Diabetes mellitus without complication (HCC)   . Gout, unspecified   . Hypertension   . Mixed hyperlipidemia   . 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 Obstructive sleep apnea syndrome   . Peripheral venous insufficiency   . Primary generalized hypertrophic osteoarthrosis   . Primary hypothyroidism   . Severe recurrent major depression with psychotic features (HCC)      Vital Signs: BP 132/72   Pulse 70   Temp 98.5 F (36.9 C)   Resp 16   Ht 5\' 7"  (1.702 m)   Wt (!) 360 lb (163.3 kg)   SpO2 96%   BMI 56.38 kg/m    Review of Systems  Constitutional: Negative for chills, fatigue and unexpected weight change.  HENT: Negative for congestion, rhinorrhea, sneezing and sore throat.   Eyes: Negative for photophobia, pain and redness.  Respiratory: Negative for cough, chest tightness and shortness of breath.   Cardiovascular: Negative for chest pain and palpitations.  Gastrointestinal: Negative for abdominal pain, constipation, diarrhea, nausea and vomiting.  Endocrine: Negative.   Genitourinary: Negative for dysuria and frequency.  Musculoskeletal: Negative for arthralgias, back pain, joint swelling and neck pain.  Skin: Negative for rash.  Allergic/Immunologic: Negative.   Neurological: Negative for tremors and numbness.  Hematological: Negative for adenopathy. Does not bruise/bleed easily.  Psychiatric/Behavioral: Negative for behavioral problems and sleep disturbance. The patient is not nervous/anxious.     Physical Exam  Constitutional: She is oriented to person, place, and time. She appears well-developed and well-nourished. No distress.  HENT:  Head: Normocephalic and atraumatic.  Mouth/Throat: Oropharynx is clear and moist. No oropharyngeal exudate.  Eyes: Pupils are equal, round, and reactive to light. EOM are normal.  Neck: Normal range of motion. Neck supple. No JVD present. No tracheal deviation present. No thyromegaly present.   Cardiovascular: Normal rate, regular rhythm and normal heart sounds. Exam reveals no gallop and no friction rub.  No murmur heard. Pulmonary/Chest: Effort normal and breath sounds normal. No respiratory distress. She has no wheezes. She has no rales. She exhibits no tenderness.  Abdominal: Soft. There is no tenderness. There is no guarding.  Musculoskeletal: Normal range of motion.  Lymphadenopathy:    She has no cervical adenopathy.  Neurological: She is alert and oriented to person, place, and time. No cranial nerve deficit.  Skin: Skin is warm and dry. She is not diaphoretic.  Psychiatric: She has a normal mood and affect. Her behavior is normal. Judgment and thought content normal.  Nursing note and vitals reviewed.   Assessment/Plan: 1. Uncontrolled type 2 diabetes mellitus with hyperglycemia (HCC) Pt does not wish to add medications or increase doses at this time.  She wants to work on her diet for 6-8 weeks and return.  - POCT HgB A1C - Microalbumin, urine -  glucose blood (ACCU-CHEK AVIVA PLUS) test strip; Use as instructed  Dispense: 500 each; Refill: 3  2. Flu vaccine need - Flu Vaccine MDCK QUAD PF  3. Morbid obesity (HCC) Obesity Counseling: Risk Assessment: An assessment of behavioral risk factors was made today and includes lack of exercise sedentary lifestyle, lack of portion control and poor dietary habits.  Risk Modification Advice: She was counseled on portion control guidelines. Restricting daily caloric intake to. . The detrimental long term effects of obesity on her health and ongoing poor compliance was also discussed with the patient.    General Counseling: Deanna Rice verbalizes understanding of the findings of todays visit and agrees with plan of treatment. I have discussed any further diagnostic evaluation that may be needed or ordered today. We also reviewed her medications today. she has been encouraged to call the office with any questions or concerns that  should arise related to todays visit.   Orders Placed This Encounter  Procedures  . Flu Vaccine MDCK QUAD PF  . Microalbumin, urine  . POCT HgB A1C    Meds ordered this encounter  Medications  . glucose blood (ACCU-CHEK AVIVA PLUS) test strip    Sig: Use as instructed    Dispense:  500 each    Refill:  3    Use as  Directed  4 times a day    Time spent: 25 Minutes  This patient was seen by Blima LedgerAdam Dandra Shambaugh AGNP-C in Collaboration with Dr Lyndon CodeFozia M Khan as a part of collaborative care agreement

## 2017-11-15 LAB — MICROALBUMIN, URINE: MICROALBUM., U, RANDOM: 1580.8 ug/mL

## 2017-11-19 ENCOUNTER — Telehealth: Payer: Self-pay

## 2017-11-19 NOTE — Telephone Encounter (Signed)
Pt advised urine micro albumin is elevated

## 2017-11-25 ENCOUNTER — Other Ambulatory Visit: Payer: Self-pay | Admitting: Adult Health

## 2017-11-25 DIAGNOSIS — D696 Thrombocytopenia, unspecified: Secondary | ICD-10-CM

## 2017-11-25 DIAGNOSIS — D649 Anemia, unspecified: Secondary | ICD-10-CM

## 2017-11-25 MED ORDER — SIMVASTATIN 80 MG PO TABS: 80.0000 mg | ORAL_TABLET | Freq: Every day | ORAL | 5 refills | Status: DC

## 2017-11-25 MED ORDER — LIRAGLUTIDE 18 MG/3ML ~~LOC~~ SOPN: PEN_INJECTOR | SUBCUTANEOUS | 5 refills | Status: DC

## 2017-11-25 MED ORDER — OMEPRAZOLE 40 MG PO CPDR: 40.0000 mg | DELAYED_RELEASE_CAPSULE | Freq: Every day | ORAL | 5 refills | Status: DC

## 2017-11-25 MED ORDER — NYSTATIN 100000 UNIT/GM EX POWD: Freq: Every day | CUTANEOUS | 5 refills | Status: DC

## 2017-11-25 MED ORDER — DULOXETINE HCL 60 MG PO CPEP: 60.0000 mg | ORAL_CAPSULE | Freq: Two times a day (BID) | ORAL | 5 refills | Status: DC

## 2017-11-25 MED ORDER — FLUTICASONE PROPIONATE 50 MCG/ACT NA SUSP: 2.0000 | Freq: Every day | NASAL | 5 refills | Status: DC

## 2017-11-25 MED ORDER — FLUTICASONE-SALMETEROL 250-50 MCG/DOSE IN AEPB: 1.0000 | INHALATION_SPRAY | Freq: Two times a day (BID) | RESPIRATORY_TRACT | 5 refills | Status: DC

## 2017-11-25 MED ORDER — LORATADINE 10 MG PO TABS: 10.0000 mg | ORAL_TABLET | Freq: Every day | ORAL | 5 refills | Status: DC

## 2017-11-25 MED ORDER — FOLIC ACID 1 MG PO TABS: 1.0000 mg | ORAL_TABLET | Freq: Every day | ORAL | 5 refills | Status: DC

## 2017-11-25 MED ORDER — ALLOPURINOL 100 MG PO TABS: 100.0000 mg | ORAL_TABLET | Freq: Every day | ORAL | 5 refills | Status: DC

## 2017-11-25 MED ORDER — METOPROLOL SUCCINATE ER 25 MG PO TB24: 25.0000 mg | ORAL_TABLET | Freq: Every day | ORAL | 5 refills | Status: DC

## 2017-11-25 MED ORDER — AMITRIPTYLINE HCL 25 MG PO TABS: 50.0000 mg | ORAL_TABLET | Freq: Every day | ORAL | 5 refills | Status: DC

## 2017-11-25 MED ORDER — IPRATROPIUM-ALBUTEROL 20-100 MCG/ACT IN AERS: 1.0000 | INHALATION_SPRAY | Freq: Four times a day (QID) | RESPIRATORY_TRACT | 3 refills | Status: DC

## 2017-11-25 MED ORDER — ISOSORBIDE MONONITRATE ER 30 MG PO TB24: 30.0000 mg | ORAL_TABLET | Freq: Every day | ORAL | 5 refills | Status: DC

## 2017-11-27 ENCOUNTER — Other Ambulatory Visit: Payer: Self-pay | Admitting: Adult Health

## 2017-11-27 DIAGNOSIS — G894 Chronic pain syndrome: Secondary | ICD-10-CM

## 2017-11-27 MED ORDER — TRAMADOL HCL 50 MG PO TABS: 50.0000 mg | ORAL_TABLET | Freq: Two times a day (BID) | ORAL | 0 refills | Status: DC | PRN

## 2017-12-01 ENCOUNTER — Ambulatory Visit: Payer: Self-pay | Admitting: Internal Medicine

## 2017-12-03 ENCOUNTER — Telehealth: Payer: Self-pay | Admitting: Nurse Practitioner

## 2017-12-03 NOTE — Telephone Encounter (Signed)
Prior auth for medication Tramadol was denied by medicaid. Pharmacy contacted.

## 2017-12-12 ENCOUNTER — Ambulatory Visit: Payer: Medicaid Other | Admitting: Adult Health

## 2017-12-12 ENCOUNTER — Encounter: Payer: Self-pay | Admitting: Adult Health

## 2017-12-12 VITALS — BP 120/64 | HR 80 | Temp 97.9°F | Resp 16 | Ht 67.0 in | Wt 360.0 lb

## 2017-12-12 DIAGNOSIS — L03818 Cellulitis of other sites: Secondary | ICD-10-CM

## 2017-12-12 DIAGNOSIS — I872 Venous insufficiency (chronic) (peripheral): Secondary | ICD-10-CM

## 2017-12-12 DIAGNOSIS — L97822 Non-pressure chronic ulcer of other part of left lower leg with fat layer exposed: Secondary | ICD-10-CM | POA: Diagnosis not present

## 2017-12-12 MED ORDER — DOXYCYCLINE HYCLATE 100 MG PO TABS: 100.0000 mg | ORAL_TABLET | Freq: Two times a day (BID) | ORAL | 0 refills | Status: DC

## 2017-12-12 NOTE — Progress Notes (Signed)
[facility] [address] [place], [place] [address]  Internal MEDICINE  Office Visit Note  Patient Name: Deanna Rice  [id number]  [id number]  Date of Service: 12/12/2017  Chief Complaint  Patient presents with  . Venous Stasis Ulcer    left leg started about two and half weeks ago      HPI Pt is here for a sick visit.  Patient is here complaining of left lower leg ulcers, and redness.  Is been going on for about 2-1/2 weeks.  She denies any fever or other symptoms.  She has had these ulcerations in the past that had to be attended by wound care.       Current Medication:  Outpatient Encounter Medications as of 12/12/2017  Medication Sig  . ACCU-CHEK SOFTCLIX LANCETS lancets Use as instructed  . allopurinol (ZYLOPRIM) 100 MG tablet Take 1 tablet (100 mg total) by mouth daily.  Marland Kitchen amitriptyline (ELAVIL) 25 MG tablet Take 2 tablets (50 mg total) by mouth at bedtime.  Marland Kitchen aspirin EC 81 MG tablet Take 81 mg by mouth daily.  . diclofenac sodium (VOLTAREN) 1 % GEL Apply 2 g topically 3 (three) times daily.  Marland Kitchen docusate sodium (COLACE) 100 MG capsule Take 50 mg by mouth daily as needed. Reported on 06/28/2015  . DULoxetine (CYMBALTA) 60 MG capsule Take 1 capsule (60 mg total) by mouth 2 (two) times daily.  . fluticasone (FLONASE) 50 MCG/ACT nasal spray Place 2 sprays into both nostrils daily.  . Fluticasone-Salmeterol (ADVAIR) 250-50 MCG/DOSE AEPB Inhale 1 puff into the lungs 2 (two) times daily.  . folic acid (FOLVITE) 1 MG tablet Take 1 tablet (1 mg total) by mouth daily.  Marland Kitchen glucose blood (ACCU-CHEK AVIVA PLUS) test strip Use as instructed  . insulin aspart (NOVOLOG FLEXPEN) 100 UNIT/ML FlexPen Use as directed with sliding scale maximum 63 units no more  . Insulin Glargine (BASAGLAR KWIKPEN) 100 UNIT/ML SOPN Inject 45 units [place] QD and 18 units at bedtime  . Insulin Pen Needle (GLOBAL EASE INJECT PEN NEEDLES) 32G X 4 MM MISC Use  As directed  . Ipratropium-Albuterol  (COMBIVENT RESPIMAT) 20-100 MCG/ACT AERS respimat Inhale 1 puff into the lungs every 6 (six) hours.  . isosorbide mononitrate (IMDUR) 30 MG 24 hr tablet Take 1 tablet (30 mg total) by mouth daily.  Marland Kitchen liraglutide (VICTOZA) 18 MG/3ML SOPN INJECT 1.8 MG EVERY MORNING  . loratadine (CLARITIN) 10 MG tablet Take 1 tablet (10 mg total) by mouth daily.  . Melatonin 10 MG TABS Take 40 mg by mouth at bedtime.  . metoprolol succinate (TOPROL-XL) 25 MG 24 hr tablet Take 1 tablet (25 mg total) by mouth daily.  . Multiple Vitamins-Minerals (MULTIVITAMIN WITH MINERALS) tablet Take 1 tablet by mouth daily.  . nitroGLYCERIN (NITROLINGUAL) 0.4 MG/SPRAY spray Place 1 spray under the tongue every 5 (five) minutes x 3 doses as needed for chest pain.  Marland Kitchen nystatin (NYSTATIN) powder Apply topically 6 (six) times daily.  Marland Kitchen omeprazole (PRILOSEC) 40 MG capsule Take 1 capsule (40 mg total) by mouth daily.  . OXYGEN Inhale into the lungs. Inhale 4 liters into lungs continously  . Phendimetrazine Tartrate 35 MG TABS Take 1 tablet po bid prior to meals.  . silver sulfADIAZINE (SILVADENE) 1 % cream Apply 1 application topically daily.  . simvastatin (ZOCOR) 80 MG tablet Take 1 tablet (80 mg total) by mouth daily at 6 PM.  . traMADol (ULTRAM) 50 MG tablet Take 1 tablet (50 mg total) by  mouth every 12 (twelve) hours as needed for moderate pain.  Marland Kitchen doxycycline (VIBRA-TABS) 100 MG tablet Take 1 tablet (100 mg total) by mouth 2 (two) times daily.   No facility-administered encounter medications on file as of 12/12/2017.       Medical History: Past Medical History:  Diagnosis Date  . Anemia, unspecified   . CHF (congestive heart failure) (HCC)   . Chronic ischemic heart disease, unspecified   . COPD (chronic obstructive pulmonary disease) (HCC)   . Dependence on supplemental oxygen   . Diabetes mellitus without complication (HCC)   . Gout, unspecified   . Hypertension   . Mixed hyperlipidemia   . Obstructive chronic  bronchitis without exacerbation (HCC)   . Obstructive sleep apnea syndrome   . Peripheral venous insufficiency   . Primary generalized hypertrophic osteoarthrosis   . Primary hypothyroidism   . Severe recurrent major depression with psychotic features (HCC)      Vital Signs: BP 120/64   Pulse 80   Temp 97.9 F (36.6 C)   Resp 16   Ht 5\' 7"  (1.702 m)   Wt (!) 360 lb (163.3 kg) Comment: unable to weigh patient in wheelchair  SpO2 98%   BMI 56.38 kg/m    Review of Systems  Constitutional: Negative for chills, fatigue and unexpected weight change.  HENT: Negative for congestion, rhinorrhea, sneezing and sore throat.   Eyes: Negative for photophobia, pain and redness.  Respiratory: Negative for cough, chest tightness and shortness of breath.   Cardiovascular: Negative for chest pain and palpitations.  Gastrointestinal: Negative for abdominal pain, constipation, diarrhea, nausea and vomiting.  Endocrine: Negative.   Genitourinary: Negative for dysuria and frequency.  Musculoskeletal: Negative for arthralgias, back pain, joint swelling and neck pain.  Skin: Positive for wound. Negative for rash.       Left lower leg ulcerations.  Three wounds approximately 2.5x2.5cm.  Allergic/Immunologic: Negative.   Neurological: Negative for tremors and numbness.  Hematological: Negative for adenopathy. Does not bruise/bleed easily.  Psychiatric/Behavioral: Negative for behavioral problems and sleep disturbance. The patient is not nervous/anxious.     Physical Exam  Constitutional: She is oriented to person, place, and time. She appears well-developed and well-nourished. No distress.  HENT:  Head: Normocephalic and atraumatic.  Mouth/Throat: Oropharynx is clear and moist. No oropharyngeal exudate.  Eyes: Pupils are equal, round, and reactive to light. EOM are normal.  Neck: Normal range of motion. Neck supple. No JVD present. No tracheal deviation present. No thyromegaly present.   Cardiovascular: Normal rate, regular rhythm and normal heart sounds. Exam reveals no gallop and no friction rub.  No murmur heard. Pulmonary/Chest: Effort normal and breath sounds normal. No respiratory distress. She has no wheezes. She has no rales. She exhibits no tenderness.  Abdominal: Soft. There is no tenderness. There is no guarding.  Musculoskeletal: Normal range of motion.  Lymphadenopathy:    She has no cervical adenopathy.  Neurological: She is alert and oriented to person, place, and time. No cranial nerve deficit.  Skin: Skin is warm and dry. She is not diaphoretic. There is erythema.  3 wounds approximatly 2.5cmx2.5cm. Left lower leg. Inflammation and redness present around wounds.   Psychiatric: She has a normal mood and affect. Her behavior is normal. Judgment and thought content normal.  Nursing note and vitals reviewed.  Assessment/Plan: 1. Cellulitis of other specified site Take doxycycline as prescribed and follow-up with wound care.  - doxycycline (VIBRA-TABS) 100 MG tablet; Take 1 tablet (100 mg total)  by mouth 2 (two) times daily.  Dispense: 28 tablet; Refill: 0  2. Venous stasis ulcer of other part of left lower leg with fat layer exposed without varicose veins (HCC) Referral for patient to wound care center. Given patients history of ulcers, and uncontrolled blood sugar I think its prudent to get wound care involved at this time.  - AMB referral to wound care center  General Counseling: tamryn popko understanding of the findings of todays visit and agrees with plan of treatment. I have discussed any further diagnostic evaluation that may be needed or ordered today. We also reviewed her medications today. she has been encouraged to call the office with any questions or concerns that should arise related to todays visit.   Orders Placed This Encounter  Procedures  . AMB referral to wound care center    Meds ordered this encounter  Medications  .  doxycycline (VIBRA-TABS) 100 MG tablet    Sig: Take 1 tablet (100 mg total) by mouth 2 (two) times daily.    Dispense:  28 tablet    Refill:  0    Time spent: 20 Minutes  This patient was seen by Blima Ledger AGNP-C in Collaboration with Dr Lyndon Code as a part of collaborative care agreement.  Johnna Acosta AGNP-C Internal Medicine

## 2017-12-12 NOTE — Patient Instructions (Signed)
Venous Ulcer A venous ulcer is a shallow sore on your lower leg. It is caused by poor circulation in your veins. Venous ulcer is the most common type of lower leg ulcer. You may have venous ulcers on one leg or on both legs. This condition most often develops around your ankles. This type of ulcer may last for a long time (chronic ulcer) or it may return often (recurrent ulcer). Follow these instructions at home: Wound care  Follow instructions from your doctor about: ? How to take care of your wound. ? When and how you should change your bandage (dressing). ? When you should remove your bandage. If your bandage is dry and gets stuck to your leg when you try to remove it, moisten or wet the bandage with saline solution or water. This helps you to remove it without harming your skin or wound.  Check your wound every day for signs of infection. Have a caregiver do this for you if you are not able to do it yourself. Watch for: ? More redness, swelling, or pain. ? More fluid or blood. ? Pus, warmth, or a bad smell. Medicines  Take over-the-counter and prescription medicines only as told by your doctor.  If you were prescribed an antibiotic medicine, take it or apply it as told by your doctor. Do not stop taking or using the antibiotic even if your condition improves. Activity  Do not stand or sit in one position for a long period of time. Rest with your legs raised during the day. If possible, keep your legs above your heart for 30 minutes, 3-4 times a day, or as told by your doctor.  Do not sit with your legs crossed.  Walk often to increase the blood flow in your legs. Ask your doctor what level of activity is safe for you.  If you are taking a long ride in a car or plane, take a break to walk around at least once every two hours, or as told by your doctor. Ask your doctor if you should take aspirin before long trips. General instructions   Wear elastic stockings, compression stockings,  or support hose as told by your doctor. This is very important.  Raise the foot of your bed as told by your doctor.  Do not smoke.  Keep all follow-up visits as told by your doctor. This is important. Contact a doctor if:  You have a fever.  Your ulcer is getting larger or is not healing.  Your pain gets worse.  You have more redness or swelling around your ulcer.  You have more fluid, blood, or pus coming from your ulcer after it has been cleaned by you or your doctor.  You have warmth or a bad smell coming from your ulcer. This information is not intended to replace advice given to you by your health care provider. Make sure you discuss any questions you have with your health care provider. Document Released: 03/21/2004 Document Revised: 07/20/2015 Document Reviewed: 06/22/2014 Elsevier Interactive Patient Education  2018 [employer] Cellulitis, Adult Cellulitis is a skin infection. The infected area is usually red and sore. This condition occurs most often in the arms and lower legs. It is very important to get treated for this condition. Follow these instructions at home:  Take over-the-counter and prescription medicines only as told by your doctor.  If you were prescribed an antibiotic medicine, take it as told by your doctor. Do not stop taking the antibiotic even if you start   to feel better.  Drink enough fluid to keep your pee (urine) clear or pale yellow.  Do not touch or rub the infected area.  Raise (elevate) the infected area above the level of your heart while you are sitting or lying down.  Place warm or cold wet cloths (warm or cold compresses) on the infected area. Do this as told by your doctor.  Keep all follow-up visits as told by your doctor. This is important. These visits let your doctor make sure your infection is not getting worse. Contact a doctor if:  You have a fever.  Your symptoms do not get better after 1-2 days of treatment.  Your bone  or joint under the infected area starts to hurt after the skin has healed.  Your infection comes back. This can happen in the same area or another area.  You have a swollen bump in the infected area.  You have new symptoms.  You feel ill and also have muscle aches and pains. Get help right away if:  Your symptoms get worse.  You feel very sleepy.  You throw up (vomit) or have watery poop (diarrhea) for a long time.  There are red streaks coming from the infected area.  Your red area gets larger.  Your red area turns darker. This information is not intended to replace advice given to you by your health care provider. Make sure you discuss any questions you have with your health care provider. Document Released: 07/31/2007 Document Revised: 07/20/2015 Document Reviewed: 12/21/2014 Elsevier Interactive Patient Education  2018 Elsevier Inc.  

## 2017-12-24 ENCOUNTER — Ambulatory Visit: Payer: Medicaid Other | Admitting: Internal Medicine

## 2017-12-24 ENCOUNTER — Other Ambulatory Visit: Payer: Self-pay | Admitting: Adult Health

## 2017-12-24 DIAGNOSIS — G894 Chronic pain syndrome: Secondary | ICD-10-CM

## 2017-12-25 ENCOUNTER — Other Ambulatory Visit: Payer: Self-pay | Admitting: Adult Health

## 2017-12-25 DIAGNOSIS — L03818 Cellulitis of other sites: Secondary | ICD-10-CM

## 2017-12-29 ENCOUNTER — Ambulatory Visit: Payer: Self-pay | Admitting: Internal Medicine

## 2018-01-09 ENCOUNTER — Encounter: Payer: Self-pay | Admitting: Adult Health

## 2018-01-09 ENCOUNTER — Ambulatory Visit: Payer: Medicaid Other | Admitting: Adult Health

## 2018-01-09 VITALS — BP 128/72 | HR 73 | Resp 16 | Ht 67.0 in | Wt 351.0 lb

## 2018-01-09 DIAGNOSIS — E1165 Type 2 diabetes mellitus with hyperglycemia: Secondary | ICD-10-CM

## 2018-01-09 DIAGNOSIS — Z6841 Body Mass Index (BMI) 40.0 and over, adult: Secondary | ICD-10-CM

## 2018-01-09 DIAGNOSIS — Z9981 Dependence on supplemental oxygen: Secondary | ICD-10-CM | POA: Diagnosis not present

## 2018-01-09 DIAGNOSIS — J449 Chronic obstructive pulmonary disease, unspecified: Secondary | ICD-10-CM

## 2018-01-09 DIAGNOSIS — G894 Chronic pain syndrome: Secondary | ICD-10-CM

## 2018-01-09 DIAGNOSIS — L97822 Non-pressure chronic ulcer of other part of left lower leg with fat layer exposed: Secondary | ICD-10-CM

## 2018-01-09 DIAGNOSIS — S99911A Unspecified injury of right ankle, initial encounter: Secondary | ICD-10-CM

## 2018-01-09 DIAGNOSIS — I872 Venous insufficiency (chronic) (peripheral): Secondary | ICD-10-CM | POA: Diagnosis not present

## 2018-01-09 MED ORDER — TRAMADOL HCL 50 MG PO TABS: 50.0000 mg | ORAL_TABLET | Freq: Two times a day (BID) | ORAL | 1 refills | Status: DC | PRN

## 2018-01-09 NOTE — Progress Notes (Signed)
[facility] [address] [place], [place] [address]  Internal MEDICINE  Office Visit Note  Patient Name: Deanna Rice  [id number]  [id number]  Date of Service: 01/09/2018  Chief Complaint  Patient presents with  . Gastroesophageal Reflux    stomach issues for a year that has been forgotten to talk about, concerns with omeprazole   . Venous Stasis Ulcer    going to wound care on monday    HPI  Patient is here for follow-up on diabetes, venous stasis ulcer, COPD, and chronic pain.  Patient's diabetes remains uncontrolled and she shows consistent blood sugars greater than 250 mg/dl.  Her venous stasis ulcer is still present to the left lower extremity and she will see the wound care on Monday.  She is also complaining of some lower abdominal pain.  This pain is intermittent and she is pointing to the underside of her pannus.  She denies anything that makes this pain worse or better.  No rash ulcer or lesions found.  Her chronic pain is currently well controlled with tramadol and she is requesting a refill at this time.   Current Medication: Outpatient Encounter Medications as of 01/09/2018  Medication Sig  . ACCU-CHEK SOFTCLIX LANCETS lancets Use as instructed  . allopurinol (ZYLOPRIM) 100 MG tablet Take 1 tablet (100 mg total) by mouth daily.  Marland Kitchen amitriptyline (ELAVIL) 25 MG tablet Take 2 tablets (50 mg total) by mouth at bedtime.  Marland Kitchen aspirin EC 81 MG tablet Take 81 mg by mouth daily.  . diclofenac sodium (VOLTAREN) 1 % GEL Apply 2 g topically 3 (three) times daily.  Marland Kitchen docusate sodium (COLACE) 100 MG capsule Take 50 mg by mouth daily as needed. Reported on 06/28/2015  . DULoxetine (CYMBALTA) 60 MG capsule Take 1 capsule (60 mg total) by mouth 2 (two) times daily.  . fluticasone (FLONASE) 50 MCG/ACT nasal spray Place 2 sprays into both nostrils daily.  . Fluticasone-Salmeterol (ADVAIR) 250-50 MCG/DOSE AEPB Inhale 1 puff into the lungs 2 (two) times daily.  . folic acid  (FOLVITE) 1 MG tablet Take 1 tablet (1 mg total) by mouth daily.  Marland Kitchen glucose blood (ACCU-CHEK AVIVA PLUS) test strip Use as instructed  . insulin aspart (NOVOLOG FLEXPEN) 100 UNIT/ML FlexPen Use as directed with sliding scale maximum 63 units no more  . Insulin Glargine (BASAGLAR KWIKPEN) 100 UNIT/ML SOPN Inject 45 units [place] QD and 18 units at bedtime  . Insulin Pen Needle (GLOBAL EASE INJECT PEN NEEDLES) 32G X 4 MM MISC Use  As directed  . Ipratropium-Albuterol (COMBIVENT RESPIMAT) 20-100 MCG/ACT AERS respimat Inhale 1 puff into the lungs every 6 (six) hours.  . isosorbide mononitrate (IMDUR) 30 MG 24 hr tablet Take 1 tablet (30 mg total) by mouth daily.  Marland Kitchen liraglutide (VICTOZA) 18 MG/3ML SOPN INJECT 1.8 MG EVERY MORNING  . loratadine (CLARITIN) 10 MG tablet Take 1 tablet (10 mg total) by mouth daily.  . Melatonin 10 MG TABS Take 40 mg by mouth at bedtime.  . metoprolol succinate (TOPROL-XL) 25 MG 24 hr tablet Take 1 tablet (25 mg total) by mouth daily.  . Multiple Vitamins-Minerals (MULTIVITAMIN WITH MINERALS) tablet Take 1 tablet by mouth daily.  . nitroGLYCERIN (NITROLINGUAL) 0.4 MG/SPRAY spray Place 1 spray under the tongue every 5 (five) minutes x 3 doses as needed for chest pain.  Marland Kitchen nystatin (NYSTATIN) powder Apply topically 6 (six) times daily.  Marland Kitchen omeprazole (PRILOSEC) 40 MG capsule Take 1 capsule (40 mg total) by mouth daily.  Marland Kitchen  OXYGEN Inhale into the lungs. Inhale 4 liters into lungs continously  . Phendimetrazine Tartrate 35 MG TABS Take 1 tablet po bid prior to meals.  . silver sulfADIAZINE (SILVADENE) 1 % cream Apply 1 application topically daily.  . simvastatin (ZOCOR) 80 MG tablet Take 1 tablet (80 mg total) by mouth daily at 6 PM.  . traMADol (ULTRAM) 50 MG tablet Take 1 tablet (50 mg total) by mouth every 12 (twelve) hours as needed for moderate pain.  . [DISCONTINUED] traMADol (ULTRAM) 50 MG tablet Take 1 tablet (50 mg total) by mouth every 12 (twelve) hours as needed for moderate  pain.  . [DISCONTINUED] doxycycline (VIBRA-TABS) 100 MG tablet Take 1 tablet (100 mg total) by mouth 2 (two) times daily. (Patient not taking: Reported on 01/09/2018)   No facility-administered encounter medications on file as of 01/09/2018.     Surgical History: Past Surgical History:  Procedure Laterality Date  . TONSILLECTOMY      Medical History: Past Medical History:  Diagnosis Date  . Anemia, unspecified   . CHF (congestive heart failure) (HCC)   . Chronic ischemic heart disease, unspecified   . COPD (chronic obstructive pulmonary disease) (HCC)   . Dependence on supplemental oxygen   . Diabetes mellitus without complication (HCC)   . Gout, unspecified   . Hypertension   . Mixed hyperlipidemia   . 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 Obstructive sleep apnea syndrome   . Peripheral venous insufficiency   . Primary generalized hypertrophic osteoarthrosis   . Primary hypothyroidism   . Severe recurrent major depression with psychotic features (HCC)     Family History: Family History  Problem Relation Age of Onset  . Hypertension Mother   . Diabetes Mother   . CAD Mother   . CAD Father   . Diabetes Father   . Hypertension Father   . Hypertension Sister   . Diabetes Sister   . CAD Sister     Social History   Socioeconomic History  . Marital status: Single    Spouse name: Not on file  . Number of children: Not on file  . Years of education: Not on file  . Highest education level: Not on file  Occupational History  . Not on file  Social Needs  . Financial resource strain: Not on file  . Food insecurity:    Worry: Not on file    Inability: Not on file  . Transportation needs:    Medical: Not on file    Non-medical: Not on file  Tobacco Use  . Smoking status: Former Games developer  . Smokeless tobacco: Never Used  Substance and Sexual Activity  . Alcohol use: No  . Drug use: No  . Sexual activity: Not on file  Lifestyle  . Physical  activity:    Days per week: Not on file    Minutes per session: Not on file  . Stress: Not on file  Relationships  . Social connections:    Talks on phone: Not on file    Gets together: Not on file    Attends religious service: Not on file    Active member of club or organization: Not on file    Attends meetings of clubs or organizations: Not on file    Relationship status: Not on file  . Intimate partner violence:    Fear of current or ex partner: Not on file    Emotionally abused: Not on file    Physically abused: Not  on file    Forced sexual activity: Not on file  Other Topics Concern  . Not on file  Social History Narrative  . Not on file      Review of Systems  Constitutional: Negative for chills, fatigue and unexpected weight change.  HENT: Negative for congestion, rhinorrhea, sneezing and sore throat.   Eyes: Negative for photophobia, pain and redness.  Respiratory: Negative for cough, chest tightness and shortness of breath.   Cardiovascular: Negative for chest pain and palpitations.  Gastrointestinal: Negative for abdominal pain, constipation, diarrhea, nausea and vomiting.  Endocrine: Negative.   Genitourinary: Negative for dysuria and frequency.  Musculoskeletal: Negative for arthralgias, back pain, joint swelling and neck pain.  Skin: Negative for rash.  Allergic/Immunologic: Negative.   Neurological: Negative for tremors and numbness.  Hematological: Negative for adenopathy. Does not bruise/bleed easily.  Psychiatric/Behavioral: Negative for behavioral problems and sleep disturbance. The patient is not nervous/anxious.     Vital Signs: BP 128/72 (BP Location: Right Arm, Patient Position: Sitting, Cuff Size: Normal)   Pulse 73   Resp 16   Ht 5\' 7"  (1.702 m)   Wt (!) 351 lb (159.2 kg)   SpO2 98%   BMI 54.97 kg/m    Physical Exam  Constitutional: She is oriented to person, place, and time. She appears well-developed and well-nourished. No distress.   HENT:  Head: Normocephalic and atraumatic.  Mouth/Throat: Oropharynx is clear and moist. No oropharyngeal exudate.  Eyes: Pupils are equal, round, and reactive to light. EOM are normal.  Neck: Normal range of motion. Neck supple. No JVD present. No tracheal deviation present. No thyromegaly present.  Cardiovascular: Normal rate, regular rhythm and normal heart sounds. Exam reveals no gallop and no friction rub.  No murmur heard. Pulmonary/Chest: Effort normal and breath sounds normal. No respiratory distress. She has no wheezes. She has no rales. She exhibits no tenderness.  Abdominal: Soft. There is no tenderness. There is no guarding.  Musculoskeletal: Normal range of motion.  Lymphadenopathy:    She has no cervical adenopathy.  Neurological: She is alert and oriented to person, place, and time. No cranial nerve deficit.  Skin: Skin is warm and dry. She is not diaphoretic.  Psychiatric: She has a normal mood and affect. Her behavior is normal. Judgment and thought content normal.  Nursing note and vitals reviewed.  Assessment/Plan: 1. Uncontrolled type 2 diabetes mellitus with hyperglycemia (HCC) Patient's blood sugar log shows blood sugars mostly between 250 and 325.  Patient struggles with nutritional guidance and intake.  She eats a diet that is very carbohydrate heavy and her home health aide who is with her in this visit today reports that she will eat an entire loaf of bread every 2 days if she purchases it.  We had a long discussion about carbohydrates and when carbohydrates can be found in diet and how she should avoid them at this point all cost.  We discussed long-term effects of uncontrolled blood sugars.  Patient verbalized understanding.  Will refer patient to endocrinology to assist with medication management. - Ambulatory referral to Endocrinology  2. Venous stasis ulcer of other part of left lower leg with fat layer exposed without varicose veins (HCC) Patient has venous  stasis ulcer to the left lower extremity, her cellulitis has resolved and she was see wound care on Monday for her first appointment.  I implored patient that her wound would likely not heal if her blood sugars remained out of control and again stressed the importance  of controlling her blood sugar to help her ulcer of left heel. - Ambulatory referral to Endocrinology  3. Chronic obstructive pulmonary disease, unspecified COPD type (HCC) Patient COPD is stable at this time.  She denies any issues as long as she gets to wear her oxygen.  She is currently wearing oxygen at 2 L/min via nasal cannula.  4. Oxygen dependent Patient reports she is dependent upon her oxygen, and she needs it around-the-clock.  She continues to use 2 L via nasal cannula with good symptom management.  5. Chronic pain syndrome Patient's tramadol prescription refill at this visit. - traMADol (ULTRAM) 50 MG tablet; Take 1 tablet (50 mg total) by mouth every 12 (twelve) hours as needed for moderate pain.  Dispense: 60 tablet; Refill: 1  6. Injury of right ankle, initial encounter Patient reports right ankle pain that has been persistent now for quite some time.  She reports that in her left her wheelchair she ran her right foot into the hospital bed in her home.  She is concerned there may be a fracture.  Will order right ankle x-ray. - DG Ankle Complete Right; Future  7. Class 3 severe obesity due to excess calories with serious comorbidity and body mass index (BMI) of 50.0 to 59.9 in adult [facility]) Obesity Counseling: Risk Assessment: An assessment of behavioral risk factors was made today and includes lack of exercise sedentary lifestyle, lack of portion control and poor dietary habits.  Risk Modification Advice: She was counseled on portion control guidelines. Restricting daily caloric intake to. . The detrimental long term effects of obesity on her health and ongoing poor compliance was also discussed with the  patient.  General Counseling: saretta dahlem understanding of the findings of todays visit and agrees with plan of treatment. I have discussed any further diagnostic evaluation that may be needed or ordered today. We also reviewed her medications today. she has been encouraged to call the office with any questions or concerns that should arise related to todays visit.    Orders Placed This Encounter  Procedures  . DG Ankle Complete Right  . Ambulatory referral to Endocrinology    Meds ordered this encounter  Medications  . traMADol (ULTRAM) 50 MG tablet    Sig: Take 1 tablet (50 mg total) by mouth every 12 (twelve) hours as needed for moderate pain.    Dispense:  60 tablet    Refill:  1    Patient reassessed today for chronic pain in back and left leg.    Time spent: 30 Minutes   This patient was seen by Blima Ledger AGNP-C in Collaboration with Dr Lyndon Code as a part of collaborative care agreement     Johnna Acosta AGNP-C Internal medicine

## 2018-01-09 NOTE — Patient Instructions (Signed)
Diabetes Mellitus and Nutrition When you have diabetes (diabetes mellitus), it is very important to have healthy eating habits because your blood sugar (glucose) levels are greatly affected by what you eat and drink. Eating healthy foods in the appropriate amounts, at about the same times every day, can help you:  Control your blood glucose.  Lower your risk of heart disease.  Improve your blood pressure.  Reach or maintain a healthy weight.  Every person with diabetes is different, and each person has different needs for a meal plan. Your health care provider may recommend that you work with a diet and nutrition specialist (dietitian) to make a meal plan that is best for you. Your meal plan may vary depending on factors such as:  The calories you need.  The medicines you take.  Your weight.  Your blood glucose, blood pressure, and cholesterol levels.  Your activity level.  Other health conditions you have, such as heart or kidney disease.  How do carbohydrates affect me? Carbohydrates affect your blood glucose level more than any other type of food. Eating carbohydrates naturally increases the amount of glucose in your blood. Carbohydrate counting is a method for keeping track of how many carbohydrates you eat. Counting carbohydrates is important to keep your blood glucose at a healthy level, especially if you use insulin or take certain oral diabetes medicines. It is important to know how many carbohydrates you can safely have in each meal. This is different for every person. Your dietitian can help you calculate how many carbohydrates you should have at each meal and for snack. Foods that contain carbohydrates include:  Bread, cereal, rice, pasta, and crackers.  Potatoes and corn.  Peas, beans, and lentils.  Milk and yogurt.  Fruit and juice.  Desserts, such as cakes, cookies, ice cream, and candy.  How does alcohol affect me? Alcohol can cause a sudden decrease in blood  glucose (hypoglycemia), especially if you use insulin or take certain oral diabetes medicines. Hypoglycemia can be a life-threatening condition. Symptoms of hypoglycemia (sleepiness, dizziness, and confusion) are similar to symptoms of having too much alcohol. If your health care provider says that alcohol is safe for you, follow these guidelines:  Limit alcohol intake to no more than 1 drink per day for nonpregnant women and 2 drinks per day for men. One drink equals 12 oz of beer, 5 oz of wine, or 1 oz of hard liquor.  Do not drink on an empty stomach.  Keep yourself hydrated with water, diet soda, or unsweetened iced tea.  Keep in mind that regular soda, juice, and other mixers may contain a lot of sugar and must be counted as carbohydrates.  What are tips for following this plan? Reading food labels  Start by checking the serving size on the label. The amount of calories, carbohydrates, fats, and other nutrients listed on the label are based on one serving of the food. Many foods contain more than one serving per package.  Check the total grams (g) of carbohydrates in one serving. You can calculate the number of servings of carbohydrates in one serving by dividing the total carbohydrates by 15. For example, if a food has 30 g of total carbohydrates, it would be equal to 2 servings of carbohydrates.  Check the number of grams (g) of saturated and trans fats in one serving. Choose foods that have low or no amount of these fats.  Check the number of milligrams (mg) of sodium in one serving. Most people   should limit total sodium intake to less than 2,300 mg per day.  Always check the nutrition information of foods labeled as "low-fat" or "nonfat". These foods may be higher in added sugar or refined carbohydrates and should be avoided.  Talk to your dietitian to identify your daily goals for nutrients listed on the label. Shopping  Avoid buying canned, premade, or processed foods. These  foods tend to be high in fat, sodium, and added sugar.  Shop around the outside edge of the grocery store. This includes fresh fruits and vegetables, bulk grains, fresh meats, and fresh dairy. Cooking  Use low-heat cooking methods, such as baking, instead of high-heat cooking methods like deep frying.  Cook using healthy oils, such as olive, canola, or sunflower oil.  Avoid cooking with butter, cream, or high-fat meats. Meal planning  Eat meals and snacks regularly, preferably at the same times every day. Avoid going long periods of time without eating.  Eat foods high in fiber, such as fresh fruits, vegetables, beans, and whole grains. Talk to your dietitian about how many servings of carbohydrates you can eat at each meal.  Eat 4-6 ounces of lean protein each day, such as lean meat, chicken, fish, eggs, or tofu. 1 ounce is equal to 1 ounce of meat, chicken, or fish, 1 egg, or 1/4 cup of tofu.  Eat some foods each day that contain healthy fats, such as avocado, nuts, seeds, and fish. Lifestyle   Check your blood glucose regularly.  Exercise at least 30 minutes 5 or more days each week, or as told by your health care provider.  Take medicines as told by your health care provider.  Do not use any products that contain nicotine or tobacco, such as cigarettes and e-cigarettes. If you need help quitting, ask your health care provider.  Work with a counselor or diabetes educator to identify strategies to manage stress and any emotional and social challenges. What are some questions to ask my health care provider?  Do I need to meet with a diabetes educator?  Do I need to meet with a dietitian?  What number can I call if I have questions?  When are the best times to check my blood glucose? Where to find more information:  American Diabetes Association: diabetes.org/food-and-fitness/food  Academy of Nutrition and Dietetics:  www.eatright.org/resources/health/diseases-and-conditions/diabetes  National Institute of Diabetes and Digestive and Kidney Diseases (NIH): www.niddk.nih.gov/health-information/diabetes/overview/diet-eating-physical-activity Summary  A healthy meal plan will help you control your blood glucose and maintain a healthy lifestyle.  Working with a diet and nutrition specialist (dietitian) can help you make a meal plan that is best for you.  Keep in mind that carbohydrates and alcohol have immediate effects on your blood glucose levels. It is important to count carbohydrates and to use alcohol carefully. This information is not intended to replace advice given to you by your health care provider. Make sure you discuss any questions you have with your health care provider. Document Released: 11/08/2004 Document Revised: 03/18/2016 Document Reviewed: 03/18/2016 Elsevier Interactive Patient Education  2018 Elsevier Inc.  

## 2018-01-12 ENCOUNTER — Ambulatory Visit: Payer: Medicaid Other | Admitting: Physician Assistant

## 2018-02-09 ENCOUNTER — Ambulatory Visit: Payer: Self-pay | Admitting: Adult Health

## 2018-02-11 ENCOUNTER — Ambulatory Visit: Payer: Medicaid Other | Admitting: Internal Medicine

## 2018-02-27 ENCOUNTER — Telehealth: Payer: Self-pay

## 2018-02-27 NOTE — Telephone Encounter (Signed)
Mailed letter for jury duty

## 2018-03-05 ENCOUNTER — Telehealth: Payer: Self-pay

## 2018-03-05 NOTE — Telephone Encounter (Signed)
Faxed [facility] continue home health and nursing

## 2018-03-23 ENCOUNTER — Ambulatory Visit: Payer: Self-pay | Admitting: Adult Health

## 2018-03-25 ENCOUNTER — Other Ambulatory Visit: Payer: Self-pay | Admitting: Adult Health

## 2018-03-25 DIAGNOSIS — G894 Chronic pain syndrome: Secondary | ICD-10-CM

## 2018-03-25 NOTE — Telephone Encounter (Signed)
Can you please send  

## 2018-03-26 ENCOUNTER — Other Ambulatory Visit: Payer: Self-pay

## 2018-03-26 ENCOUNTER — Other Ambulatory Visit: Payer: Self-pay | Admitting: Adult Health

## 2018-03-26 MED ORDER — INSULIN ASPART 100 UNIT/ML FLEXPEN: PEN_INJECTOR | SUBCUTANEOUS | 3 refills | Status: DC

## 2018-03-31 ENCOUNTER — Ambulatory Visit: Payer: Self-pay | Admitting: Internal Medicine

## 2018-03-31 ENCOUNTER — Telehealth: Payer: Self-pay | Admitting: Adult Health

## 2018-03-31 NOTE — Telephone Encounter (Signed)
Prior authorization for medication victoza has been approved for 9 ml for thirty days, approval good from 03/31/18 -03/26/2019  PA# [id number] pharmacy notified

## 2018-04-02 ENCOUNTER — Ambulatory Visit: Payer: Self-pay | Admitting: Internal Medicine

## 2018-04-09 ENCOUNTER — Ambulatory Visit: Payer: Self-pay | Admitting: Internal Medicine

## 2018-04-16 ENCOUNTER — Encounter: Payer: Self-pay | Admitting: Internal Medicine

## 2018-04-16 ENCOUNTER — Ambulatory Visit: Payer: Medicaid Other | Admitting: Internal Medicine

## 2018-04-16 VITALS — BP 122/66 | HR 93 | Resp 16 | Ht 67.0 in | Wt 351.0 lb

## 2018-04-16 DIAGNOSIS — G4733 Obstructive sleep apnea (adult) (pediatric): Secondary | ICD-10-CM | POA: Diagnosis not present

## 2018-04-16 DIAGNOSIS — Z9981 Dependence on supplemental oxygen: Secondary | ICD-10-CM

## 2018-04-16 DIAGNOSIS — Z6841 Body Mass Index (BMI) 40.0 and over, adult: Secondary | ICD-10-CM

## 2018-04-16 DIAGNOSIS — Z9989 Dependence on other enabling machines and devices: Secondary | ICD-10-CM

## 2018-04-16 DIAGNOSIS — J449 Chronic obstructive pulmonary disease, unspecified: Secondary | ICD-10-CM

## 2018-04-16 NOTE — Progress Notes (Signed)
[facility] [address] [place], [place] [address]  Pulmonary Sleep Medicine   Office Visit Note  Patient Name: Deanna Rice DOB: 01/10/1956 MRN [id number]  Date of Service: 04/16/2018  Complaints/HPI: Pt is here for CPAP compliance.  She reports she is wearing the machine nightly.  She is cleaning her machine and reservoir as instructed.  She is changing the tubing as instructed also.  She reports excellent relief of symptoms when using her machine.  She also has a care giver who reports she is wearing the machine nightly.  She wears oxygen on 4Lpm via [place] during th day.  There is also oxygen connected to her CPAP at night.    ROS  General: (-) fever, (-) chills, (-) night sweats, (-) weakness Skin: (-) rashes, (-) itching,. Eyes: (-) visual changes, (-) redness, (-) itching. Nose and Sinuses: (-) nasal stuffiness or itchiness, (-) postnasal drip, (-) nosebleeds, (-) sinus trouble. Mouth and Throat: (-) sore throat, (-) hoarseness. Neck: (-) swollen glands, (-) enlarged thyroid, (-) neck pain. Respiratory: - cough, (-) bloody sputum, - shortness of breath, - wheezing. Cardiovascular: - ankle swelling, (-) chest pain. Lymphatic: (-) lymph node enlargement. Neurologic: (-) numbness, (-) tingling. Psychiatric: (-) anxiety, (-) depression   Current Medication: Outpatient Encounter Medications as of 04/16/2018  Medication Sig  . ACCU-CHEK SOFTCLIX LANCETS lancets Use as instructed  . allopurinol (ZYLOPRIM) 100 MG tablet Take 1 tablet (100 mg total) by mouth daily.  Marland Kitchen amitriptyline (ELAVIL) 25 MG tablet Take 2 tablets (50 mg total) by mouth at bedtime.  Marland Kitchen aspirin EC 81 MG tablet Take 81 mg by mouth daily.  . COMBIVENT RESPIMAT 20-100 MCG/ACT AERS respimat INHALE 1 PUFF BY MOUTH INTO THE LUNGS EVERY 6 HOURS  . diclofenac sodium (VOLTAREN) 1 % GEL Apply 2 g topically 3 (three) times daily.  Marland Kitchen docusate sodium (COLACE) 100 MG capsule Take 50 mg by mouth daily as needed.  Reported on 06/28/2015  . DULoxetine (CYMBALTA) 60 MG capsule Take 1 capsule (60 mg total) by mouth 2 (two) times daily.  . fluticasone (FLONASE) 50 MCG/ACT nasal spray Place 2 sprays into both nostrils daily.  . Fluticasone-Salmeterol (ADVAIR) 250-50 MCG/DOSE AEPB Inhale 1 puff into the lungs 2 (two) times daily.  . folic acid (FOLVITE) 1 MG tablet Take 1 tablet (1 mg total) by mouth daily.  Marland Kitchen glucose blood (ACCU-CHEK AVIVA PLUS) test strip Use as instructed  . insulin aspart (NOVOLOG FLEXPEN) 100 UNIT/ML FlexPen Use as directed with sliding scale maximum 63 units no more  . Insulin Glargine (BASAGLAR KWIKPEN) 100 UNIT/ML SOPN Inject 45 units [place] QD and 18 units at bedtime  . Insulin Pen Needle (GLOBAL EASE INJECT PEN NEEDLES) 32G X 4 MM MISC Use  As directed  . isosorbide mononitrate (IMDUR) 30 MG 24 hr tablet Take 1 tablet (30 mg total) by mouth daily.  Marland Kitchen liraglutide (VICTOZA) 18 MG/3ML SOPN INJECT 1.8 MG EVERY MORNING  . loratadine (CLARITIN) 10 MG tablet Take 1 tablet (10 mg total) by mouth daily.  . Melatonin 10 MG TABS Take 40 mg by mouth at bedtime.  . metoprolol succinate (TOPROL-XL) 25 MG 24 hr tablet Take 1 tablet (25 mg total) by mouth daily.  . Multiple Vitamins-Minerals (MULTIVITAMIN WITH MINERALS) tablet Take 1 tablet by mouth daily.  . nitroGLYCERIN (NITROLINGUAL) 0.4 MG/SPRAY spray Place 1 spray under the tongue every 5 (five) minutes x 3 doses as needed for chest pain.  Marland Kitchen nystatin (NYSTATIN) powder Apply topically  6 (six) times daily.  Marland Kitchen omeprazole (PRILOSEC) 40 MG capsule Take 1 capsule (40 mg total) by mouth daily.  . OXYGEN Inhale into the lungs. Inhale 4 liters into lungs continously  . Phendimetrazine Tartrate 35 MG TABS Take 1 tablet po bid prior to meals.  . silver sulfADIAZINE (SILVADENE) 1 % cream Apply 1 application topically daily.  . simvastatin (ZOCOR) 80 MG tablet Take 1 tablet (80 mg total) by mouth daily at 6 PM.  . traMADol (ULTRAM) 50 MG tablet TAKE 1 TABLET BY  MOUTH EVERY 12 HOURS ASNEEDED FOR MODERATE PAIN   No facility-administered encounter medications on file as of 04/16/2018.     Surgical History: Past Surgical History:  Procedure Laterality Date  . TONSILLECTOMY      Medical History: Past Medical History:  Diagnosis Date  . Anemia, unspecified   . CHF (congestive heart failure) (HCC)   . Chronic ischemic heart disease, unspecified   . COPD (chronic obstructive pulmonary disease) (HCC)   . Dependence on supplemental oxygen   . Diabetes mellitus without complication (HCC)   . Gout, unspecified   . Hypertension   . Mixed hyperlipidemia   . 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 Obstructive sleep apnea syndrome   . Peripheral venous insufficiency   . Primary generalized hypertrophic osteoarthrosis   . Primary hypothyroidism   . Severe recurrent major depression with psychotic features (HCC)     Family History: Family History  Problem Relation Age of Onset  . Hypertension Mother   . Diabetes Mother   . CAD Mother   . CAD Father   . Diabetes Father   . Hypertension Father   . Hypertension Sister   . Diabetes Sister   . CAD Sister     Social History: Social History   Socioeconomic History  . Marital status: Single    Spouse name: Not on file  . Number of children: Not on file  . Years of education: Not on file  . Highest education level: Not on file  Occupational History  . Not on file  Social Needs  . Financial resource strain: Not on file  . Food insecurity:    Worry: Not on file    Inability: Not on file  . Transportation needs:    Medical: Not on file    Non-medical: Not on file  Tobacco Use  . Smoking status: Former Games developer  . Smokeless tobacco: Never Used  Substance and Sexual Activity  . Alcohol use: No  . Drug use: No  . Sexual activity: Not on file  Lifestyle  . Physical activity:    Days per week: Not on file    Minutes per session: Not on file  . Stress: Not on file   Relationships  . Social connections:    Talks on phone: Not on file    Gets together: Not on file    Attends religious service: Not on file    Active member of club or organization: Not on file    Attends meetings of clubs or organizations: Not on file    Relationship status: Not on file  . Intimate partner violence:    Fear of current or ex partner: Not on file    Emotionally abused: Not on file    Physically abused: Not on file    Forced sexual activity: Not on file  Other Topics Concern  . Not on file  Social History Narrative  . Not on file  Vital Signs: Blood pressure 122/66, pulse 93, resp. rate 16, height 5\' 7"  (1.702 m), weight (!) 351 lb (159.2 kg), SpO2 97 %.  Examination: General Appearance: The patient is well-developed, well-nourished, and in no distress. Skin: Gross inspection of skin unremarkable. Head: normocephalic, no gross deformities. Eyes: no gross deformities noted. ENT: ears appear grossly normal no exudates. Neck: Supple. No thyromegaly. No LAD. Respiratory: clear bilaterally. Cardiovascular: Normal S1 and S2 without murmur or rub. Extremities: No cyanosis. pulses are equal. Neurologic: Alert and oriented. No involuntary movements.  LABS: No results found for this or any previous visit (from the past 2160 hour(s)).  Radiology: Dg Tibia/fibula Left  Result Date: 12/28/2015 CLINICAL DATA:  Bilateral lower lower leg ulcerations, acute onset. Initial encounter. EXAM: LEFT TIBIA AND FIBULA - 2 VIEW COMPARISON:  None. FINDINGS: There is chronic deformity about the distal femur and proximal tibia, likely reflecting remote injury. Some degree of bony remodeling is noted. No definite acute osseous erosions are identified. Skin thickening is noted along the anterior aspect of the lower leg, with scattered soft tissue calcifications. Mild degenerative change is noted at the talar dome. IMPRESSION: 1. Chronic deformity about the distal femur and proximal tibia.  No acute osseous erosions seen. 2. Skin thickening along the anterior aspect of the lower leg, with scattered soft tissue calcifications. Electronically Signed   By: Roanna RaiderJeffery  Chang M.D.   On: 12/28/2015 21:06   Dg Tibia/fibula Right  Result Date: 12/28/2015 CLINICAL DATA:  Bilateral lower leg ulcerations, acute onset. Initial encounter. EXAM: RIGHT TIBIA AND FIBULA - 2 VIEW COMPARISON:  None. FINDINGS: There is no evidence of osseous disruption. No osseous erosions are identified. Small marginal osteophytes are noted arising at the medial and lateral compartments. The ankle mortise is incompletely assessed, but grossly unremarkable in appearance. Scattered soft tissue calcifications are seen. A small posterior calcaneal spur is seen. IMPRESSION: No osseous erosions seen. No evidence of fracture. Scattered soft tissue calcifications seen. Electronically Signed   By: Roanna RaiderJeffery  Chang M.D.   On: 12/28/2015 21:03    No results found.  No results found.    Assessment and Plan: Patient Active Problem List   Diagnosis Date Noted  . Chronic pain syndrome 08/03/2017  . Oxygen dependent 08/03/2017  . Thrombocytopenia (HCC) 09/28/2015  . Anemia in chronic kidney disease 09/28/2015  . Diarrhea 11/28/2014  . AKI (acute kidney injury) (HCC) 11/28/2014  . COPD (chronic obstructive pulmonary disease) (HCC) 11/28/2014  . Uncontrolled type 2 diabetes mellitus with hypoglycemia (HCC) 11/28/2014  . HTN (hypertension) 11/28/2014   1. OSA on CPAP Continue using cpap as instructed.    2. Chronic obstructive pulmonary disease, unspecified COPD type (HCC) Pt will continue to use oxygen as well as inhalers and other medications as prescribed.   3. Oxygen dependent Pt will continue to use 4 lpm of oxygen continuously.   4. Class 3 severe obesity due to excess calories with serious comorbidity and body mass index (BMI) of 50.0 to 59.9 in adult [facility](HCC) Obesity Counseling: Risk Assessment: An assessment of  behavioral risk factors was made today and includes lack of exercise sedentary lifestyle, lack of portion control and poor dietary habits.  Risk Modification Advice: She was counseled on portion control guidelines. Restricting daily caloric intake to. . The detrimental long term effects of obesity on her health and ongoing poor compliance was also discussed with the patient.     General Counseling: I have discussed the findings of the evaluation and examination with Deanna Rice.  I have also discussed any further diagnostic evaluation thatmay be needed or ordered today. Deanna Rice verbalizes understanding of the findings of todays visit. We also reviewed her medications today and discussed drug interactions and side effects including but not limited excessive drowsiness and altered mental states. We also discussed that there is always a risk not just to her but also people around her. she has been encouraged to call the office with any questions or concerns that should arise related to todays visit.    Time spent: 25 This patient was seen by Blima Ledger AGNP-C in Collaboration with Dr. Freda Munro as a part of collaborative care agreement.   I have personally obtained a history, examined the patient, evaluated laboratory and imaging results, formulated the assessment and plan and placed orders.    Yevonne Pax, MD [facility] Pulmonary and Critical Care Sleep medicine

## 2018-04-16 NOTE — Patient Instructions (Signed)

## 2018-04-20 NOTE — Addendum Note (Signed)
Addended by: Freda Munro on: 04/20/2018 08:30 PM   Modules accepted: Level of Service

## 2018-04-21 ENCOUNTER — Encounter: Payer: Self-pay | Admitting: Adult Health

## 2018-04-21 ENCOUNTER — Ambulatory Visit: Payer: Medicaid Other | Admitting: Adult Health

## 2018-04-21 VITALS — BP 112/76 | HR 73 | Resp 16 | Ht 67.0 in | Wt 359.0 lb

## 2018-04-21 DIAGNOSIS — Z6841 Body Mass Index (BMI) 40.0 and over, adult: Secondary | ICD-10-CM

## 2018-04-21 DIAGNOSIS — E785 Hyperlipidemia, unspecified: Secondary | ICD-10-CM | POA: Diagnosis not present

## 2018-04-21 DIAGNOSIS — Z9981 Dependence on supplemental oxygen: Secondary | ICD-10-CM

## 2018-04-21 DIAGNOSIS — E1165 Type 2 diabetes mellitus with hyperglycemia: Secondary | ICD-10-CM

## 2018-04-21 DIAGNOSIS — E119 Type 2 diabetes mellitus without complications: Secondary | ICD-10-CM

## 2018-04-21 DIAGNOSIS — J449 Chronic obstructive pulmonary disease, unspecified: Secondary | ICD-10-CM | POA: Diagnosis not present

## 2018-04-21 DIAGNOSIS — I10 Essential (primary) hypertension: Secondary | ICD-10-CM | POA: Diagnosis not present

## 2018-04-21 DIAGNOSIS — F339 Major depressive disorder, recurrent, unspecified: Secondary | ICD-10-CM

## 2018-04-21 LAB — POCT GLYCOSYLATED HEMOGLOBIN (HGB A1C): Hemoglobin A1C: 8.1 % — AB (ref 4.0–5.6)

## 2018-04-21 MED ORDER — PHENDIMETRAZINE TARTRATE 35 MG PO TABS: ORAL_TABLET | ORAL | 1 refills | Status: DC

## 2018-04-21 NOTE — Progress Notes (Signed)
[facility] [address] [place], [place] [address]  Internal MEDICINE  Office Visit Note  Patient Name: Deanna Rice  [id number]  [id number]  Date of Service: 04/21/2018  Chief Complaint  Patient presents with  . Diabetes  . Hypertension  . Hyperlipidemia  . Depression  . Quality Metric Gaps    foot exam, eye exam     HPI Pt is here for follow up on DM. HTN, HLD, and depression.  Patient's blood pressure is well controlled.  She denies any issues with her depression currently.  Her A1c today is 8.1 which is slightly improved from 8.4 at her last check.  Patient denies any chest pain, shortness of breath, palpitations or any other issues.  She is requesting a refill on her phentermine.  She is also in need of an eye exam to close her quality metric gaps.   Current Medication: Outpatient Encounter Medications as of 04/21/2018  Medication Sig  . ACCU-CHEK SOFTCLIX LANCETS lancets Use as instructed  . allopurinol (ZYLOPRIM) 100 MG tablet Take 1 tablet (100 mg total) by mouth daily.  Marland Kitchen amitriptyline (ELAVIL) 25 MG tablet Take 2 tablets (50 mg total) by mouth at bedtime.  Marland Kitchen aspirin EC 81 MG tablet Take 81 mg by mouth daily.  . COMBIVENT RESPIMAT 20-100 MCG/ACT AERS respimat INHALE 1 PUFF BY MOUTH INTO THE LUNGS EVERY 6 HOURS  . diclofenac sodium (VOLTAREN) 1 % GEL Apply 2 g topically 3 (three) times daily.  Marland Kitchen docusate sodium (COLACE) 100 MG capsule Take 50 mg by mouth daily as needed. Reported on 06/28/2015  . DULoxetine (CYMBALTA) 60 MG capsule Take 1 capsule (60 mg total) by mouth 2 (two) times daily.  . fluticasone (FLONASE) 50 MCG/ACT nasal spray Place 2 sprays into both nostrils daily.  . Fluticasone-Salmeterol (ADVAIR) 250-50 MCG/DOSE AEPB Inhale 1 puff into the lungs 2 (two) times daily.  . folic acid (FOLVITE) 1 MG tablet Take 1 tablet (1 mg total) by mouth daily.  Marland Kitchen glucose blood (ACCU-CHEK AVIVA PLUS) test strip Use as instructed  . insulin aspart (NOVOLOG  FLEXPEN) 100 UNIT/ML FlexPen Use as directed with sliding scale maximum 63 units no more  . Insulin Glargine (BASAGLAR KWIKPEN) 100 UNIT/ML SOPN Inject 45 units [place] QD and 18 units at bedtime  . Insulin Pen Needle (GLOBAL EASE INJECT PEN NEEDLES) 32G X 4 MM MISC Use  As directed  . isosorbide mononitrate (IMDUR) 30 MG 24 hr tablet Take 1 tablet (30 mg total) by mouth daily.  Marland Kitchen liraglutide (VICTOZA) 18 MG/3ML SOPN INJECT 1.8 MG EVERY MORNING  . loratadine (CLARITIN) 10 MG tablet Take 1 tablet (10 mg total) by mouth daily.  . Melatonin 10 MG TABS Take 40 mg by mouth at bedtime.  . metoprolol succinate (TOPROL-XL) 25 MG 24 hr tablet Take 1 tablet (25 mg total) by mouth daily.  . Multiple Vitamins-Minerals (MULTIVITAMIN WITH MINERALS) tablet Take 1 tablet by mouth daily.  . nitroGLYCERIN (NITROLINGUAL) 0.4 MG/SPRAY spray Place 1 spray under the tongue every 5 (five) minutes x 3 doses as needed for chest pain.  Marland Kitchen nystatin (NYSTATIN) powder Apply topically 6 (six) times daily.  Marland Kitchen omeprazole (PRILOSEC) 40 MG capsule Take 1 capsule (40 mg total) by mouth daily.  . OXYGEN Inhale into the lungs. Inhale 4 liters into lungs continously  . Phendimetrazine Tartrate 35 MG TABS Take 1 tablet po bid prior to meals.  . silver sulfADIAZINE (SILVADENE) 1 % cream Apply 1 application topically daily.  Marland Kitchen  simvastatin (ZOCOR) 80 MG tablet Take 1 tablet (80 mg total) by mouth daily at 6 PM.  . traMADol (ULTRAM) 50 MG tablet TAKE 1 TABLET BY MOUTH EVERY 12 HOURS ASNEEDED FOR MODERATE PAIN  . [DISCONTINUED] Phendimetrazine Tartrate 35 MG TABS Take 1 tablet po bid prior to meals.   No facility-administered encounter medications on file as of 04/21/2018.     Surgical History: Past Surgical History:  Procedure Laterality Date  . TONSILLECTOMY      Medical History: Past Medical History:  Diagnosis Date  . Anemia, unspecified   . CHF (congestive heart failure) (HCC)   . Chronic ischemic heart disease, unspecified   .  COPD (chronic obstructive pulmonary disease) (HCC)   . Dependence on supplemental oxygen   . Diabetes mellitus without complication (HCC)   . Gout, unspecified   . Hypertension   . Mixed hyperlipidemia   . 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 Obstructive sleep apnea syndrome   . Peripheral venous insufficiency   . Primary generalized hypertrophic osteoarthrosis   . Primary hypothyroidism   . Severe recurrent major depression with psychotic features (HCC)     Family History: Family History  Problem Relation Age of Onset  . Hypertension Mother   . Diabetes Mother   . CAD Mother   . CAD Father   . Diabetes Father   . Hypertension Father   . Hypertension Sister   . Diabetes Sister   . CAD Sister     Social History   Socioeconomic History  . Marital status: Single    Spouse name: Not on file  . Number of children: Not on file  . Years of education: Not on file  . Highest education level: Not on file  Occupational History  . Not on file  Social Needs  . Financial resource strain: Not on file  . Food insecurity:    Worry: Not on file    Inability: Not on file  . Transportation needs:    Medical: Not on file    Non-medical: Not on file  Tobacco Use  . Smoking status: Former Games developer  . Smokeless tobacco: Never Used  Substance and Sexual Activity  . Alcohol use: No  . Drug use: No  . Sexual activity: Not on file  Lifestyle  . Physical activity:    Days per week: Not on file    Minutes per session: Not on file  . Stress: Not on file  Relationships  . Social connections:    Talks on phone: Not on file    Gets together: Not on file    Attends religious service: Not on file    Active member of club or organization: Not on file    Attends meetings of clubs or organizations: Not on file    Relationship status: Not on file  . Intimate partner violence:    Fear of current or ex partner: Not on file    Emotionally abused: Not on file     Physically abused: Not on file    Forced sexual activity: Not on file  Other Topics Concern  . Not on file  Social History Narrative  . Not on file      Review of Systems  Constitutional: Negative for chills, fatigue and unexpected weight change.  HENT: Negative for congestion, rhinorrhea, sneezing and sore throat.   Eyes: Negative for photophobia, pain and redness.  Respiratory: Negative for cough, chest tightness and shortness of breath.   Cardiovascular: Negative  for chest pain and palpitations.  Gastrointestinal: Negative for abdominal pain, constipation, diarrhea, nausea and vomiting.  Endocrine: Negative.   Genitourinary: Negative for dysuria and frequency.  Musculoskeletal: Negative for arthralgias, back pain, joint swelling and neck pain.  Skin: Negative for rash.  Allergic/Immunologic: Negative.   Neurological: Negative for tremors and numbness.  Hematological: Negative for adenopathy. Does not bruise/bleed easily.  Psychiatric/Behavioral: Negative for behavioral problems and sleep disturbance. The patient is not nervous/anxious.     Vital Signs: BP 112/76   Pulse 73   Resp 16   Ht 5\' 7"  (1.702 m)   Wt (!) 359 lb (162.8 kg) Comment: pt in wheelchair  SpO2 99% Comment: 4 liter  BMI 56.23 kg/m    Physical Exam Vitals signs and nursing note reviewed.  Constitutional:      General: She is not in acute distress.    Appearance: She is well-developed. She is not diaphoretic.  HENT:     Head: Normocephalic and atraumatic.     Mouth/Throat:     Pharynx: No oropharyngeal exudate.  Eyes:     Pupils: Pupils are equal, round, and reactive to light.  Neck:     Musculoskeletal: Normal range of motion and neck supple.     Thyroid: No thyromegaly.     Vascular: No JVD.     Trachea: No tracheal deviation.  Cardiovascular:     Rate and Rhythm: Normal rate and regular rhythm.     Heart sounds: Normal heart sounds. No murmur. No friction rub. No gallop.   Pulmonary:      Effort: Pulmonary effort is normal. No respiratory distress.     Breath sounds: Normal breath sounds. No wheezing or rales.  Chest:     Chest wall: No tenderness.  Abdominal:     Palpations: Abdomen is soft.     Tenderness: There is no abdominal tenderness. There is no guarding.  Musculoskeletal: Normal range of motion.  Lymphadenopathy:     Cervical: No cervical adenopathy.  Skin:    General: Skin is warm and dry.  Neurological:     Mental Status: She is alert and oriented to person, place, and time.     Cranial Nerves: No cranial nerve deficit.  Psychiatric:        Behavior: Behavior normal.        Thought Content: Thought content normal.        Judgment: Judgment normal.    Assessment/Plan: 1. Uncontrolled type 2 diabetes mellitus with hyperglycemia (HCC) Patient A1c slightly improved 8.1.  After discussion with the patient she will increase her Basaglar from 48 units to 50 units a day.  She has been making better choices with her diet and she is attempting to make her sugar better.  Referral to ophthalmology for diabetic eye exam. - POCT HgB A1C - Ambulatory referral to Ophthalmology  2. Essential hypertension Stable, continue current medications as prescribed.  3. Hyperlipidemia, unspecified hyperlipidemia type Patient is due for lipid panel check.  4. Chronic obstructive pulmonary disease, unspecified COPD type (HCC) Doing well at this time.  Remains on oxygen continuously.  Continue using inhalers and other medications as prescribed.  5. Depression, recurrent (HCC) Stable, continue current medication regimen.  6. Oxygen dependent Patient continue to use oxygen as prescribed continuously.  7. Class 3 severe obesity due to excess calories with serious comorbidity and body mass index (BMI) of 50.0 to 59.9 in adult [facility]) Obesity Counseling: Risk Assessment: An assessment of behavioral risk factors was made today  and includes lack of exercise sedentary lifestyle, lack of  portion control and poor dietary habits.  Risk Modification Advice: She was counseled on portion control guidelines. Restricting daily caloric intake to. . The detrimental long term effects of obesity on her health and ongoing poor compliance was also discussed with the patient.  Patient's phentermine prescription refilled at this time.  Obtained actual standing weight of patient on scale at this visit.  Instructed patient that we will reweigh her in 2 months and she returns if he has not lost any weight will not be able to continue her phentermine prescription. There is a liability release in patients' chart. There has been a 10 minute discussion about the side effects including but not limited to elevated blood pressure, anxiety, lack of sleep and dry mouth. Pt understands and will like to start/continue on appetite suppressant at this time. There will be one month RX given at the time of visit with proper follow up. Nova diet plan with restricted calories is given to the pt. Pt understands and agrees with  plan of treatment - Phendimetrazine Tartrate 35 MG TABS; Take 1 tablet po bid prior to meals.  Dispense: 30 each; Refill: 1  8. Encounter for diabetic foot exam (HCC) Foot exam performed at this visit.   General Counseling: deloros beretta understanding of the findings of todays visit and agrees with plan of treatment. I have discussed any further diagnostic evaluation that may be needed or ordered today. We also reviewed her medications today. she has been encouraged to call the office with any questions or concerns that should arise related to todays visit.    Orders Placed This Encounter  Procedures  . Ambulatory referral to Ophthalmology  . POCT HgB A1C    Meds ordered this encounter  Medications  . Phendimetrazine Tartrate 35 MG TABS    Sig: Take 1 tablet po bid prior to meals.    Dispense:  30 each    Refill:  1    Time spent: 25 Minutes   This patient was seen by Blima Ledger AGNP-C in Collaboration with Dr Lyndon Code as a part of collaborative care agreement     Johnna Acosta AGNP-C Internal medicine

## 2018-04-21 NOTE — Patient Instructions (Signed)
Diabetes Mellitus and Nutrition, Adult  When you have diabetes (diabetes mellitus), it is very important to have healthy eating habits because your blood sugar (glucose) levels are greatly affected by what you eat and drink. Eating healthy foods in the appropriate amounts, at about the same times every day, can help you:  · Control your blood glucose.  · Lower your risk of heart disease.  · Improve your blood pressure.  · Reach or maintain a healthy weight.  Every person with diabetes is different, and each person has different needs for a meal plan. Your health care provider may recommend that you work with a diet and nutrition specialist (dietitian) to make a meal plan that is best for you. Your meal plan may vary depending on factors such as:  · The calories you need.  · The medicines you take.  · Your weight.  · Your blood glucose, blood pressure, and cholesterol levels.  · Your activity level.  · Other health conditions you have, such as heart or kidney disease.  How do carbohydrates affect me?  Carbohydrates, also called carbs, affect your blood glucose level more than any other type of food. Eating carbs naturally raises the amount of glucose in your blood. Carb counting is a method for keeping track of how many carbs you eat. Counting carbs is important to keep your blood glucose at a healthy level, especially if you use insulin or take certain oral diabetes medicines.  It is important to know how many carbs you can safely have in each meal. This is different for every person. Your dietitian can help you calculate how many carbs you should have at each meal and for each snack.  Foods that contain carbs include:  · Bread, cereal, rice, pasta, and crackers.  · Potatoes and corn.  · Peas, beans, and lentils.  · Milk and yogurt.  · Fruit and juice.  · Desserts, such as cakes, cookies, ice cream, and candy.  How does alcohol affect me?  Alcohol can cause a sudden decrease in blood glucose (hypoglycemia),  especially if you use insulin or take certain oral diabetes medicines. Hypoglycemia can be a life-threatening condition. Symptoms of hypoglycemia (sleepiness, dizziness, and confusion) are similar to symptoms of having too much alcohol.  If your health care provider says that alcohol is safe for you, follow these guidelines:  · Limit alcohol intake to no more than 1 drink per day for nonpregnant women and 2 drinks per day for men. One drink equals 12 oz of beer, 5 oz of wine, or 1½ oz of hard liquor.  · Do not drink on an empty stomach.  · Keep yourself hydrated with water, diet soda, or unsweetened iced tea.  · Keep in mind that regular soda, juice, and other mixers may contain a lot of sugar and must be counted as carbs.  What are tips for following this plan?    Reading food labels  · Start by checking the serving size on the "Nutrition Facts" label of packaged foods and drinks. The amount of calories, carbs, fats, and other nutrients listed on the label is based on one serving of the item. Many items contain more than one serving per package.  · Check the total grams (g) of carbs in one serving. You can calculate the number of servings of carbs in one serving by dividing the total carbs by 15. For example, if a food has 30 g of total carbs, it would be equal to 2   servings of carbs.  · Check the number of grams (g) of saturated and trans fats in one serving. Choose foods that have low or no amount of these fats.  · Check the number of milligrams (mg) of salt (sodium) in one serving. Most people should limit total sodium intake to less than 2,300 mg per day.  · Always check the nutrition information of foods labeled as "low-fat" or "nonfat". These foods may be higher in added sugar or refined carbs and should be avoided.  · Talk to your dietitian to identify your daily goals for nutrients listed on the label.  Shopping  · Avoid buying canned, premade, or processed foods. These foods tend to be high in fat, sodium,  and added sugar.  · Shop around the outside edge of the grocery store. This includes fresh fruits and vegetables, bulk grains, fresh meats, and fresh dairy.  Cooking  · Use low-heat cooking methods, such as baking, instead of high-heat cooking methods like deep frying.  · Cook using healthy oils, such as olive, canola, or sunflower oil.  · Avoid cooking with butter, cream, or high-fat meats.  Meal planning  · Eat meals and snacks regularly, preferably at the same times every day. Avoid going long periods of time without eating.  · Eat foods high in fiber, such as fresh fruits, vegetables, beans, and whole grains. Talk to your dietitian about how many servings of carbs you can eat at each meal.  · Eat 4-6 ounces (oz) of lean protein each day, such as lean meat, chicken, fish, eggs, or tofu. One oz of lean protein is equal to:  ? 1 oz of meat, chicken, or fish.  ? 1 egg.  ? ¼ cup of tofu.  · Eat some foods each day that contain healthy fats, such as avocado, nuts, seeds, and fish.  Lifestyle  · Check your blood glucose regularly.  · Exercise regularly as told by your health care provider. This may include:  ? 150 minutes of moderate-intensity or vigorous-intensity exercise each week. This could be brisk walking, biking, or water aerobics.  ? Stretching and doing strength exercises, such as yoga or weightlifting, at least 2 times a week.  · Take medicines as told by your health care provider.  · Do not use any products that contain nicotine or tobacco, such as cigarettes and e-cigarettes. If you need help quitting, ask your health care provider.  · Work with a counselor or diabetes educator to identify strategies to manage stress and any emotional and social challenges.  Questions to ask a health care provider  · Do I need to meet with a diabetes educator?  · Do I need to meet with a dietitian?  · What number can I call if I have questions?  · When are the best times to check my blood glucose?  Where to find more  information:  · American Diabetes Association: diabetes.org  · Academy of Nutrition and Dietetics: www.eatright.org  · National Institute of Diabetes and Digestive and Kidney Diseases (NIH): www.niddk.nih.gov  Summary  · A healthy meal plan will help you control your blood glucose and maintain a healthy lifestyle.  · Working with a diet and nutrition specialist (dietitian) can help you make a meal plan that is best for you.  · Keep in mind that carbohydrates (carbs) and alcohol have immediate effects on your blood glucose levels. It is important to count carbs and to use alcohol carefully.  This information is not intended to   replace advice given to you by your health care provider. Make sure you discuss any questions you have with your health care provider.  Document Released: 11/08/2004 Document Revised: 09/11/2016 Document Reviewed: 03/18/2016  Elsevier Interactive Patient Education © 2019 [employer]

## 2018-04-24 ENCOUNTER — Telehealth: Payer: Self-pay

## 2018-04-24 NOTE — Telephone Encounter (Signed)
Tried calling patient, needing to discuss her possible orders for power wheelchair with a different DME company then the one she already uses per [facility] [phone]. Deanna Rice

## 2018-05-26 ENCOUNTER — Other Ambulatory Visit: Payer: Self-pay | Admitting: Adult Health

## 2018-05-26 DIAGNOSIS — D649 Anemia, unspecified: Secondary | ICD-10-CM

## 2018-05-26 DIAGNOSIS — G894 Chronic pain syndrome: Secondary | ICD-10-CM

## 2018-05-26 DIAGNOSIS — D696 Thrombocytopenia, unspecified: Secondary | ICD-10-CM

## 2018-06-17 ENCOUNTER — Encounter: Payer: Self-pay | Admitting: Adult Health

## 2018-06-18 ENCOUNTER — Ambulatory Visit: Payer: Medicaid Other | Admitting: Nurse Practitioner

## 2018-06-18 ENCOUNTER — Other Ambulatory Visit: Payer: Self-pay

## 2018-06-19 ENCOUNTER — Other Ambulatory Visit: Payer: Self-pay | Admitting: Adult Health

## 2018-06-19 DIAGNOSIS — D649 Anemia, unspecified: Secondary | ICD-10-CM

## 2018-06-24 ENCOUNTER — Other Ambulatory Visit: Payer: Self-pay

## 2018-06-24 DIAGNOSIS — D696 Thrombocytopenia, unspecified: Secondary | ICD-10-CM

## 2018-06-24 MED ORDER — BASAGLAR KWIKPEN 100 UNIT/ML ~~LOC~~ SOPN: PEN_INJECTOR | SUBCUTANEOUS | 3 refills | Status: DC

## 2018-07-22 ENCOUNTER — Ambulatory Visit: Payer: Medicaid Other | Admitting: Adult Health

## 2018-07-27 ENCOUNTER — Other Ambulatory Visit: Payer: Self-pay

## 2018-07-27 MED ORDER — IPRATROPIUM-ALBUTEROL 20-100 MCG/ACT IN AERS: INHALATION_SPRAY | RESPIRATORY_TRACT | 3 refills | Status: DC

## 2018-08-14 ENCOUNTER — Other Ambulatory Visit: Payer: Self-pay

## 2018-08-14 DIAGNOSIS — Z794 Long term (current) use of insulin: Secondary | ICD-10-CM | POA: Diagnosis not present

## 2018-08-14 DIAGNOSIS — Z5321 Procedure and treatment not carried out due to patient leaving prior to being seen by health care provider: Secondary | ICD-10-CM | POA: Insufficient documentation

## 2018-08-14 DIAGNOSIS — E1165 Type 2 diabetes mellitus with hyperglycemia: Secondary | ICD-10-CM | POA: Insufficient documentation

## 2018-08-14 DIAGNOSIS — Z9981 Dependence on supplemental oxygen: Secondary | ICD-10-CM | POA: Diagnosis not present

## 2018-08-14 LAB — CBC
HCT: 29.5 % — ABNORMAL LOW (ref 36.0–46.0)
Hemoglobin: 10.1 g/dL — ABNORMAL LOW (ref 12.0–15.0)
MCH: 29 pg (ref 26.0–34.0)
MCHC: 34.2 g/dL (ref 30.0–36.0)
MCV: 84.8 fL (ref 80.0–100.0)
Platelets: 146 10*3/uL — ABNORMAL LOW (ref 150–400)
RBC: 3.48 MIL/uL — ABNORMAL LOW (ref 3.87–5.11)
RDW: 15.4 % (ref 11.5–15.5)
WBC: 8.6 10*3/uL (ref 4.0–10.5)
nRBC: 0 % (ref 0.0–0.2)

## 2018-08-14 LAB — BASIC METABOLIC PANEL
Anion gap: 12 (ref 5–15)
BUN: 45 mg/dL — ABNORMAL HIGH (ref 8–23)
CO2: 24 mmol/L (ref 22–32)
Calcium: 8.7 mg/dL — ABNORMAL LOW (ref 8.9–10.3)
Chloride: 96 mmol/L — ABNORMAL LOW (ref 98–111)
Creatinine, Ser: 1.24 mg/dL — ABNORMAL HIGH (ref 0.44–1.00)
GFR calc Af Amer: 53 mL/min — ABNORMAL LOW (ref >60)
GFR calc non Af Amer: 46 mL/min — ABNORMAL LOW (ref >60)
Glucose, Bld: 424 mg/dL — ABNORMAL HIGH (ref 70–99)
Potassium: 4.2 mmol/L (ref 3.5–5.1)
Sodium: 132 mmol/L — ABNORMAL LOW (ref 135–145)

## 2018-08-14 NOTE — ED Triage Notes (Signed)
First Nurse: patient brought in by ems from home. Patient called out for not feeling well. Patient fsbs was 478. Patient reported to ems that she took insulin about an hour prior. Per ems bp 174/76, hr 84 and 95 % on 2L. Patient uses oxygen at 4L at home per ems.

## 2018-08-14 NOTE — ED Triage Notes (Signed)
"  my sugar is really high"  Reports sugars have been running high for approximately 2 weeks.

## 2018-08-15 ENCOUNTER — Emergency Department
Admission: EM | Admit: 2018-08-15 | Discharge: 2018-08-15 | Disposition: A | Discharge status: Home / Self Care | Payer: Medicaid Other | Attending: Emergency Medicine | Admitting: Emergency Medicine

## 2018-08-17 ENCOUNTER — Telehealth: Payer: Self-pay | Admitting: Emergency Medicine

## 2018-08-17 NOTE — Telephone Encounter (Signed)
Called patient due to lwot to inquire about condition and follow up plans.  No answer and no voicemail  

## 2018-08-18 ENCOUNTER — Ambulatory Visit: Payer: Medicaid Other | Admitting: Adult Health

## 2018-09-08 ENCOUNTER — Encounter: Payer: Self-pay | Admitting: Adult Health

## 2018-09-08 ENCOUNTER — Ambulatory Visit: Payer: Medicaid Other | Admitting: Adult Health

## 2018-09-08 ENCOUNTER — Other Ambulatory Visit: Payer: Self-pay

## 2018-09-08 VITALS — BP 130/80 | HR 76 | Temp 97.6°F | Resp 16

## 2018-09-08 DIAGNOSIS — E785 Hyperlipidemia, unspecified: Secondary | ICD-10-CM | POA: Diagnosis not present

## 2018-09-08 DIAGNOSIS — L02416 Cutaneous abscess of left lower limb: Secondary | ICD-10-CM

## 2018-09-08 DIAGNOSIS — E1165 Type 2 diabetes mellitus with hyperglycemia: Secondary | ICD-10-CM

## 2018-09-08 DIAGNOSIS — Z9981 Dependence on supplemental oxygen: Secondary | ICD-10-CM

## 2018-09-08 DIAGNOSIS — J449 Chronic obstructive pulmonary disease, unspecified: Secondary | ICD-10-CM

## 2018-09-08 DIAGNOSIS — I10 Essential (primary) hypertension: Secondary | ICD-10-CM

## 2018-09-08 DIAGNOSIS — F339 Major depressive disorder, recurrent, unspecified: Secondary | ICD-10-CM

## 2018-09-08 DIAGNOSIS — L03116 Cellulitis of left lower limb: Secondary | ICD-10-CM

## 2018-09-08 MED ORDER — DOXYCYCLINE HYCLATE 100 MG PO TABS: 100.0000 mg | ORAL_TABLET | Freq: Two times a day (BID) | ORAL | 0 refills | Status: DC

## 2018-09-08 NOTE — Progress Notes (Signed)
[facility] [address] [place], [place] [address]  Internal MEDICINE  Telephone Visit  Patient Name: Deanna LittenSandra C Warnell  [id number]-2057  [id number]  Date of Service: 09/08/2018  I connected with the patient at 957 by telephone and verified the patients identity using two identifiers.   I discussed the limitations, risks, security and privacy concerns of performing an evaluation and management service by telephone and the availability of in person appointments. I also discussed with the patient that there may be a patient responsible charge related to the service.  The patient expressed understanding and agrees to proceed.    Chief Complaint  Patient presents with  . Telephone Screen  . Medical Management of Chronic Issues  . Diabetes    bs this am 203  . Hypothyroidism  . Hypertension  . Hyperlipidemia  . Telephone Assessment    HPI PT is seen via video for follow up on DM, HLD, HTN and Hypothyroid. Pt reports overall she is doing well.  She denies any current needs.  Her blood pressure today is good 130/80.  Her blood sugar this morning was 203.  Pt has had some really great blood sugars recently, however she also had some "high" readings.  Her sister recently died, and she did eat a lot of things she shouldn't at this time.  She reports she has been taking her medications as indicated.  She is current using sliding scale insulin and basagla.  She takes 45 units of basaglar in the morning and 18 units in the evening.     Current Medication: Outpatient Encounter Medications as of 09/08/2018  Medication Sig  . ACCU-CHEK SOFTCLIX LANCETS lancets Use as instructed  . ADVAIR DISKUS 250-50 MCG/DOSE AEPB INHALE 1 PUFF BY MOUTH INTO THE LUNGS TWO TIMES DAILY  . allopurinol (ZYLOPRIM) 100 MG tablet TAKE 1 TABLET BY MOUTH DAILY  . amitriptyline (ELAVIL) 25 MG tablet TAKE 2 TABLETS BY MOUTH AT BEDTIME  . aspirin EC 81 MG tablet Take 81 mg by mouth daily.  . diclofenac sodium  (VOLTAREN) 1 % GEL Apply 2 g topically 3 (three) times daily.  Marland Kitchen. docusate sodium (COLACE) 100 MG capsule Take 50 mg by mouth daily as needed. Reported on 06/28/2015  . DULoxetine (CYMBALTA) 60 MG capsule TAKE ONE CAPSULE BY MOUTH TWICE A DAY  . fluticasone (FLONASE) 50 MCG/ACT nasal spray PLACE 2 SPRAYS INTO BOTH NOSTRILS DAILY  . folic acid (FOLVITE) 1 MG tablet TAKE 1 TABLET BY MOUTH DAILY  . glucose blood (ACCU-CHEK AVIVA PLUS) test strip Use as instructed  . insulin aspart (NOVOLOG FLEXPEN) 100 UNIT/ML FlexPen Use as directed with sliding scale maximum 63 units no more  . Insulin Glargine (BASAGLAR KWIKPEN) 100 UNIT/ML SOPN Inject 45 units [place] QD and 18 units at bedtime  . Insulin Pen Needle (GLOBAL EASE INJECT PEN NEEDLES) 32G X 4 MM MISC Use  As directed  . Ipratropium-Albuterol (COMBIVENT RESPIMAT) 20-100 MCG/ACT AERS respimat INHALE 1 PUFF BY MOUTH INTO THE LUNGS EVERY 6 HOURS  . isosorbide mononitrate (IMDUR) 30 MG 24 hr tablet TAKE 1 TABLET BY MOUTH DAILY  . liraglutide (VICTOZA) 18 MG/3ML SOPN INJECT 1.8 MG EVERY MORNING  . loratadine (CLARITIN) 10 MG tablet TAKE 1 TABLET BY MOUTH DAILY  . Melatonin 10 MG TABS Take 40 mg by mouth at bedtime.  . metoprolol succinate (TOPROL-XL) 25 MG 24 hr tablet TAKE 1 TABLET BY MOUTH DAILY  . Multiple Vitamins-Minerals (MULTIVITAMIN WITH MINERALS) tablet Take 1 tablet by mouth  daily.  . nitroGLYCERIN (NITROLINGUAL) 0.4 MG/SPRAY spray PLACE 1 SPRAY UNDER THE TONGUE EVERY 5 MINUTES FOR 3 DOSES AS NEEDED FOR CHEST PAIN.  Marland Kitchen. nystatin (NYSTATIN) powder Apply topically 6 (six) times daily.  Marland Kitchen. omeprazole (PRILOSEC) 40 MG capsule TAKE 1 CAPSULE BY MOUTH DAILY  . OXYGEN Inhale into the lungs. Inhale 4 liters into lungs continously  . Phendimetrazine Tartrate 35 MG TABS Take 1 tablet po bid prior to meals.  . silver sulfADIAZINE (SILVADENE) 1 % cream Apply 1 application topically daily.  . simvastatin (ZOCOR) 80 MG tablet TAKE 1 TABLET BY MOUTH DAILY AT 6 PM  .  traMADol (ULTRAM) 50 MG tablet TAKE 1 TABLET BY MOUTH EVERY 12 HOURS ASNEEDED FOR MODERATE PAIN  . doxycycline (VIBRA-TABS) 100 MG tablet Take 1 tablet (100 mg total) by mouth 2 (two) times daily.   No facility-administered encounter medications on file as of 09/08/2018.     Surgical History: Past Surgical History:  Procedure Laterality Date  . TONSILLECTOMY      Medical History: Past Medical History:  Diagnosis Date  . Anemia, unspecified   . CHF (congestive heart failure) (HCC)   . Chronic ischemic heart disease, unspecified   . COPD (chronic obstructive pulmonary disease) (HCC)   . Dependence on supplemental oxygen   . Diabetes mellitus without complication (HCC)   . Gout, unspecified   . Hypertension   . Mixed hyperlipidemia   . 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 Obstructive sleep apnea syndrome   . Peripheral venous insufficiency   . Primary generalized hypertrophic osteoarthrosis   . Primary hypothyroidism   . Severe recurrent major depression with psychotic features (HCC)     Family History: Family History  Problem Relation Age of Onset  . Hypertension Mother   . Diabetes Mother   . CAD Mother   . CAD Father   . Diabetes Father   . Hypertension Father   . Hypertension Sister   . Diabetes Sister   . CAD Sister     Social History   Socioeconomic History  . Marital status: Single    Spouse name: Not on file  . Number of children: Not on file  . Years of education: Not on file  . Highest education level: Not on file  Occupational History  . Not on file  Social Needs  . Financial resource strain: Not on file  . Food insecurity    Worry: Not on file    Inability: Not on file  . Transportation needs    Medical: Not on file    Non-medical: Not on file  Tobacco Use  . Smoking status: Former Games developermoker  . Smokeless tobacco: Never Used  Substance and Sexual Activity  . Alcohol use: No  . Drug use: No  . Sexual activity: Not on  file  Lifestyle  . Physical activity    Days per week: Not on file    Minutes per session: Not on file  . Stress: Not on file  Relationships  . Social Musicianconnections    Talks on phone: Not on file    Gets together: Not on file    Attends religious service: Not on file    Active member of club or organization: Not on file    Attends meetings of clubs or organizations: Not on file    Relationship status: Not on file  . Intimate partner violence    Fear of current or ex partner: Not on file  Emotionally abused: Not on file    Physically abused: Not on file    Forced sexual activity: Not on file  Other Topics Concern  . Not on file  Social History Narrative  . Not on file      Review of Systems  Vital Signs: BP 130/80   Pulse 76   Temp 97.6 F (36.4 C)   Resp 16   SpO2 100% Comment: 4 l   Observation/Objective: Well appearing, NAD noted.     Assessment/Plan: 1. Cellulitis and abscess of left lower extremity Advised patient to take entire course of antibiotics as prescribed with food. Pt should return to clinic in 7-10 days if symptoms fail to improve or new symptoms develop.  - doxycycline (VIBRA-TABS) 100 MG tablet; Take 1 tablet (100 mg total) by mouth 2 (two) times daily.  Dispense: 20 tablet; Refill: 0  2. Uncontrolled type 2 diabetes mellitus with hyperglycemia (HCC) Increase basaglar in the evening by 2 units nighty until she reaches 24units.  Continue current sliding scale and continue diet.  Return to office in 2 months for A1C check.  3. Essential hypertension Stable, well controlled currently. Continue current regimen.   4. Hyperlipidemia, unspecified hyperlipidemia type Continue current regimen.   5. Chronic obstructive pulmonary disease, unspecified COPD type ([place]) PT is oxygen dependant.  Severe disease, continue present management.   6. Depression, recurrent (HCC) Stable, continue Cymbalta as prescribed   7. Oxygen dependent Continue with  oxygen as prescribed.   General Counseling: azizah lisle understanding of the findings of today's phone visit and agrees with plan of treatment. I have discussed any further diagnostic evaluation that may be needed or ordered today. We also reviewed her medications today. she has been encouraged to call the office with any questions or concerns that should arise related to todays visit.    No orders of the defined types were placed in this encounter.   Meds ordered this encounter  Medications  . doxycycline (VIBRA-TABS) 100 MG tablet    Sig: Take 1 tablet (100 mg total) by mouth 2 (two) times daily.    Dispense:  20 tablet    Refill:  0    Time spent: [place] AGNP-C Internal medicine

## 2018-09-11 ENCOUNTER — Other Ambulatory Visit: Payer: Self-pay | Admitting: Adult Health

## 2018-09-11 ENCOUNTER — Other Ambulatory Visit: Payer: Self-pay

## 2018-09-11 DIAGNOSIS — D696 Thrombocytopenia, unspecified: Secondary | ICD-10-CM

## 2018-09-11 MED ORDER — BASAGLAR KWIKPEN 100 UNIT/ML ~~LOC~~ SOPN: PEN_INJECTOR | SUBCUTANEOUS | 3 refills | Status: DC

## 2018-09-21 ENCOUNTER — Other Ambulatory Visit: Payer: Self-pay | Admitting: Internal Medicine

## 2018-09-21 DIAGNOSIS — G894 Chronic pain syndrome: Secondary | ICD-10-CM

## 2018-09-21 NOTE — Telephone Encounter (Signed)
Pt seen last7/14/20

## 2018-10-13 ENCOUNTER — Telehealth: Payer: Self-pay

## 2018-10-13 NOTE — Telephone Encounter (Signed)
[facility] SIGNED AND PLACED IN [facility]. °

## 2018-10-22 ENCOUNTER — Ambulatory Visit: Payer: Medicaid Other | Admitting: Internal Medicine

## 2018-10-22 ENCOUNTER — Encounter: Payer: Self-pay | Admitting: Internal Medicine

## 2018-10-22 ENCOUNTER — Other Ambulatory Visit: Payer: Self-pay | Admitting: Adult Health

## 2018-10-22 VITALS — BP 129/64 | HR 77 | Resp 16 | Ht 67.0 in | Wt 359.0 lb

## 2018-10-22 DIAGNOSIS — Z6841 Body Mass Index (BMI) 40.0 and over, adult: Secondary | ICD-10-CM

## 2018-10-22 DIAGNOSIS — E1165 Type 2 diabetes mellitus with hyperglycemia: Secondary | ICD-10-CM

## 2018-10-22 DIAGNOSIS — G4733 Obstructive sleep apnea (adult) (pediatric): Secondary | ICD-10-CM | POA: Diagnosis not present

## 2018-10-22 DIAGNOSIS — Z9981 Dependence on supplemental oxygen: Secondary | ICD-10-CM | POA: Diagnosis not present

## 2018-10-22 DIAGNOSIS — R0602 Shortness of breath: Secondary | ICD-10-CM | POA: Diagnosis not present

## 2018-10-22 DIAGNOSIS — Z9989 Dependence on other enabling machines and devices: Secondary | ICD-10-CM

## 2018-10-22 DIAGNOSIS — I10 Essential (primary) hypertension: Secondary | ICD-10-CM

## 2018-10-22 DIAGNOSIS — J449 Chronic obstructive pulmonary disease, unspecified: Secondary | ICD-10-CM | POA: Diagnosis not present

## 2018-10-22 MED ORDER — GLUCOSE BLOOD VI STRP: ORAL_STRIP | 12 refills | Status: DC

## 2018-10-22 NOTE — Progress Notes (Signed)
Glucose meter and Strips sent to [facility].

## 2018-10-22 NOTE — Progress Notes (Signed)
[facility] [address] [place], [place] [address]  Pulmonary Sleep Medicine   Office Visit Note  Patient Name: Deanna Rice DOB: Jul 08, 1955 MRN [id number]  Date of Service: 10/22/2018  Complaints/HPI: Pt is here for follow up on OSA.  She reports she has been wearing her cpap nightly, with oxygen connected.  She sleeps well, and denies any current issues.  She is cleaning the machine and changing the filters and tubing as directed. She wears 4 lpm of oxygen continually during the day. Overall she is doing well.    ROS  General: (-) fever, (-) chills, (-) night sweats, (-) weakness Skin: (-) rashes, (-) itching,. Eyes: (-) visual changes, (-) redness, (-) itching. Nose and Sinuses: (-) nasal stuffiness or itchiness, (-) postnasal drip, (-) nosebleeds, (-) sinus trouble. Mouth and Throat: (-) sore throat, (-) hoarseness. Neck: (-) swollen glands, (-) enlarged thyroid, (-) neck pain. Respiratory: - cough, (-) bloody sputum, - shortness of breath, - wheezing. Cardiovascular: - ankle swelling, (-) chest pain. Lymphatic: (-) lymph node enlargement. Neurologic: (-) numbness, (-) tingling. Psychiatric: (-) anxiety, (-) depression   Current Medication: Outpatient Encounter Medications as of 10/22/2018  Medication Sig  . ACCU-CHEK SOFTCLIX LANCETS lancets Use as instructed  . ADVAIR DISKUS 250-50 MCG/DOSE AEPB INHALE 1 PUFF BY MOUTH INTO THE LUNGS TWO TIMES DAILY  . allopurinol (ZYLOPRIM) 100 MG tablet TAKE 1 TABLET BY MOUTH DAILY  . amitriptyline (ELAVIL) 25 MG tablet TAKE 2 TABLETS BY MOUTH AT BEDTIME  . diclofenac sodium (VOLTAREN) 1 % GEL Apply 2 g topically 3 (three) times daily.  Marland Kitchen docusate sodium (COLACE) 100 MG capsule Take 50 mg by mouth daily as needed. Reported on 06/28/2015  . DULoxetine (CYMBALTA) 60 MG capsule TAKE ONE CAPSULE BY MOUTH TWICE A DAY  . fluticasone (FLONASE) 50 MCG/ACT nasal spray PLACE 2 SPRAYS INTO BOTH NOSTRILS DAILY  . folic acid (FOLVITE) 1  MG tablet TAKE 1 TABLET BY MOUTH DAILY  . glucose blood (ACCU-CHEK AVIVA PLUS) test strip Use as instructed  . insulin aspart (NOVOLOG FLEXPEN) 100 UNIT/ML FlexPen Use as directed with sliding scale maximum 63 units no more  . Insulin Glargine (BASAGLAR KWIKPEN) 100 UNIT/ML SOPN Inject 50 units Tibbie QD and 18-24 units at bedtime  . Insulin Pen Needle (GLOBAL EASE INJECT PEN NEEDLES) 32G X 4 MM MISC Use  As directed  . Ipratropium-Albuterol (COMBIVENT RESPIMAT) 20-100 MCG/ACT AERS respimat INHALE 1 PUFF BY MOUTH INTO THE LUNGS EVERY 6 HOURS  . isosorbide mononitrate (IMDUR) 30 MG 24 hr tablet TAKE 1 TABLET BY MOUTH DAILY  . liraglutide (VICTOZA) 18 MG/3ML SOPN INJECT 1.8 MG EVERY MORNING  . loratadine (CLARITIN) 10 MG tablet TAKE 1 TABLET BY MOUTH DAILY  . Melatonin 10 MG TABS Take 40 mg by mouth at bedtime.  . metoprolol succinate (TOPROL-XL) 25 MG 24 hr tablet TAKE 1 TABLET BY MOUTH DAILY  . Multiple Vitamins-Minerals (MULTIVITAMIN WITH MINERALS) tablet Take 1 tablet by mouth daily.  . nitroGLYCERIN (NITROLINGUAL) 0.4 MG/SPRAY spray PLACE 1 SPRAY UNDER THE TONGUE EVERY 5 MINUTES FOR 3 DOSES AS NEEDED FOR CHEST PAIN.  Marland Kitchen nystatin (NYSTATIN) powder Apply topically 6 (six) times daily.  Marland Kitchen omeprazole (PRILOSEC) 40 MG capsule TAKE 1 CAPSULE BY MOUTH DAILY  . OXYGEN Inhale into the lungs. Inhale 4 liters into lungs continously  . Phendimetrazine Tartrate 35 MG TABS Take 1 tablet po bid prior to meals.  . silver sulfADIAZINE (SILVADENE) 1 % cream Apply 1 application topically  daily.  . simvastatin (ZOCOR) 80 MG tablet TAKE 1 TABLET BY MOUTH DAILY AT 6 PM  . traMADol (ULTRAM) 50 MG tablet TAKE 1 TABLET BY MOUTH EVERY 12 HOURS ASNEEDED FOR MODERATE PAIN  . [DISCONTINUED] doxycycline (VIBRA-TABS) 100 MG tablet Take 1 tablet (100 mg total) by mouth 2 (two) times daily.  Marland Kitchen. aspirin EC 81 MG tablet Take 81 mg by mouth daily.   No facility-administered encounter medications on file as of 10/22/2018.      Surgical History: Past Surgical History:  Procedure Laterality Date  . TONSILLECTOMY      Medical History: Past Medical History:  Diagnosis Date  . Anemia, unspecified   . CHF (congestive heart failure) (HCC)   . Chronic ischemic heart disease, unspecified   . COPD (chronic obstructive pulmonary disease) (HCC)   . Dependence on supplemental oxygen   . Diabetes mellitus without complication (HCC)   . Gout, unspecified   . Hypertension   . Mixed hyperlipidemia   . 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 Obstructive sleep apnea syndrome   . Peripheral venous insufficiency   . Primary generalized hypertrophic osteoarthrosis   . Primary hypothyroidism   . Severe recurrent major depression with psychotic features (HCC)     Family History: Family History  Problem Relation Age of Onset  . Hypertension Mother   . Diabetes Mother   . CAD Mother   . CAD Father   . Diabetes Father   . Hypertension Father   . Hypertension Sister   . Diabetes Sister   . CAD Sister     Social History: Social History   Socioeconomic History  . Marital status: Single    Spouse name: Not on file  . Number of children: Not on file  . Years of education: Not on file  . Highest education level: Not on file  Occupational History  . Not on file  Social Needs  . Financial resource strain: Not on file  . Food insecurity    Worry: Not on file    Inability: Not on file  . Transportation needs    Medical: Not on file    Non-medical: Not on file  Tobacco Use  . Smoking status: Former Games developermoker  . Smokeless tobacco: Never Used  Substance and Sexual Activity  . Alcohol use: No  . Drug use: No  . Sexual activity: Not on file  Lifestyle  . Physical activity    Days per week: Not on file    Minutes per session: Not on file  . Stress: Not on file  Relationships  . Social Musicianconnections    Talks on phone: Not on file    Gets together: Not on file    Attends religious  service: Not on file    Active member of club or organization: Not on file    Attends meetings of clubs or organizations: Not on file    Relationship status: Not on file  . Intimate partner violence    Fear of current or ex partner: Not on file    Emotionally abused: Not on file    Physically abused: Not on file    Forced sexual activity: Not on file  Other Topics Concern  . Not on file  Social History Narrative  . Not on file    Vital Signs: Blood pressure 129/64, pulse 77, resp. rate 16, height 5\' 7"  (1.702 m), weight (!) 359 lb (162.8 kg), SpO2 98 %.  Examination: General Appearance: The patient  is well-developed, well-nourished, and in no distress. Skin: Gross inspection of skin unremarkable. Head: normocephalic, no gross deformities. Eyes: no gross deformities noted. ENT: ears appear grossly normal no exudates. Neck: Supple. No thyromegaly. No LAD. Respiratory: clear bilateraly. Cardiovascular: Normal S1 and S2 without murmur or rub. Extremities: No cyanosis. pulses are equal. Neurologic: Alert and oriented. No involuntary movements.  LABS: Recent Results (from the past 2160 hour(s))  Basic metabolic panel     Status: Abnormal   Collection Time: 08/14/18 10:40 PM  Result Value Ref Range   Sodium 132 (L) 135 - 145 mmol/L   Potassium 4.2 3.5 - 5.1 mmol/L   Chloride 96 (L) 98 - 111 mmol/L   CO2 24 22 - 32 mmol/L   Glucose, Bld 424 (H) 70 - 99 mg/dL   BUN 45 (H) 8 - 23 mg/dL   Creatinine, Ser 1.301.24 (H) 0.44 - 1.00 mg/dL   Calcium 8.7 (L) 8.9 - 10.3 mg/dL   GFR calc non Af Amer 46 (L) >60 mL/min   GFR calc Af Amer 53 (L) >60 mL/min   Anion gap 12 5 - 15    Comment: Performed at [facility], [address] [place], [place] [address]  CBC     Status: Abnormal   Collection Time: 08/14/18 10:40 PM  Result Value Ref Range   WBC 8.6 4.0 - 10.5 K/uL   RBC 3.48 (L) 3.87 - 5.11 MIL/uL   Hemoglobin 10.1 (L) 12.0 - 15.0 g/dL   HCT 46.929.5 (L) 62.936.0 - 52.846.0 %   MCV  84.8 80.0 - 100.0 fL   MCH 29.0 26.0 - 34.0 pg   MCHC 34.2 30.0 - 36.0 g/dL   RDW 41.315.4 24.411.5 - 01.015.5 %   Platelets 146 (L) 150 - 400 K/uL    Comment: Immature Platelet Fraction may be clinically indicated, consider ordering this additional test [id number]    nRBC 0.0 0.0 - 0.2 %    Comment: Performed at [facility], [address] [place], [place] [address]    Radiology: No results found.  No results found.  No results found.    Assessment and Plan: Patient Active Problem List   Diagnosis Date Noted  . Chronic pain syndrome 08/03/2017  . Oxygen dependent 08/03/2017  . Thrombocytopenia (HCC) 09/28/2015  . Anemia in chronic kidney disease 09/28/2015  . Diarrhea 11/28/2014  . AKI (acute kidney injury) (HCC) 11/28/2014  . COPD (chronic obstructive pulmonary disease) (HCC) 11/28/2014  . Uncontrolled type 2 diabetes mellitus with hypoglycemia (HCC) 11/28/2014  . HTN (hypertension) 11/28/2014    1. OSA on CPAP Continue to wear cpap nightly, and anytime she is sleeping.   2. Oxygen dependent Continue to use oxygen 4lpm as directed.   3. Chronic obstructive pulmonary disease, unspecified COPD type (HCC) Stable, continue present management, continue to use inhalers as directed.   4. Class 3 severe obesity due to excess calories with serious comorbidity and body mass index (BMI) of 50.0 to 59.9 in adult [facility](HCC) Obesity Counseling: Risk Assessment: An assessment of behavioral risk factors was made today and includes lack of exercise sedentary lifestyle, lack of portion control and poor dietary habits.  Risk Modification Advice: She was counseled on portion control guidelines. Restricting daily caloric intake to. . The detrimental long term effects of obesity on her health and ongoing poor compliance was also discussed with the patient.  5. Essential hypertension Stable, continue present management.   6. Shortness of breath FEV1 is 1.8 and 67%  -  Spirometry with  Graph  General Counseling: I have discussed the findings of the evaluation and examination with Deanna Rice.  I have also discussed any further diagnostic evaluation thatmay be needed or ordered today. Deanna Rice verbalizes understanding of the findings of todays visit. We also reviewed her medications today and discussed drug interactions and side effects including but not limited excessive drowsiness and altered mental states. We also discussed that there is always a risk not just to her but also people around her. she has been encouraged to call the office with any questions or concerns that should arise related to todays visit.    Time spent: 25 This patient was seen by Orson Gear AGNP-C in Collaboration with Dr. Devona Konig as a part of collaborative care agreement.   I have personally obtained a history, examined the patient, evaluated laboratory and imaging results, formulated the assessment and plan and placed orders.    Allyne Gee, MD [facility] Pulmonary and Critical Care Sleep medicine

## 2018-10-27 ENCOUNTER — Other Ambulatory Visit: Payer: Self-pay | Admitting: Adult Health

## 2018-10-27 DIAGNOSIS — G894 Chronic pain syndrome: Secondary | ICD-10-CM

## 2018-10-27 NOTE — Telephone Encounter (Signed)
Pt had appt on 10/29/18

## 2018-10-29 ENCOUNTER — Ambulatory Visit: Payer: Medicaid Other | Admitting: Adult Health

## 2018-11-25 ENCOUNTER — Other Ambulatory Visit: Payer: Self-pay | Admitting: Adult Health

## 2018-11-25 ENCOUNTER — Other Ambulatory Visit: Payer: Self-pay | Admitting: Internal Medicine

## 2018-11-25 DIAGNOSIS — D649 Anemia, unspecified: Secondary | ICD-10-CM

## 2018-11-25 DIAGNOSIS — D696 Thrombocytopenia, unspecified: Secondary | ICD-10-CM

## 2018-11-26 ENCOUNTER — Other Ambulatory Visit: Payer: Self-pay | Admitting: Adult Health

## 2018-11-26 DIAGNOSIS — D696 Thrombocytopenia, unspecified: Secondary | ICD-10-CM

## 2018-12-02 ENCOUNTER — Ambulatory Visit: Payer: Medicaid Other | Admitting: Adult Health

## 2018-12-02 ENCOUNTER — Encounter: Payer: Self-pay | Admitting: Adult Health

## 2018-12-02 ENCOUNTER — Other Ambulatory Visit: Payer: Self-pay

## 2018-12-02 VITALS — BP 166/77 | HR 80 | Temp 97.6°F | Resp 16

## 2018-12-02 DIAGNOSIS — E785 Hyperlipidemia, unspecified: Secondary | ICD-10-CM

## 2018-12-02 DIAGNOSIS — E1165 Type 2 diabetes mellitus with hyperglycemia: Secondary | ICD-10-CM | POA: Diagnosis not present

## 2018-12-02 DIAGNOSIS — I10 Essential (primary) hypertension: Secondary | ICD-10-CM

## 2018-12-02 LAB — POCT GLYCOSYLATED HEMOGLOBIN (HGB A1C): Hemoglobin A1C: 8.1 % — AB (ref 4.0–5.6)

## 2018-12-02 NOTE — Progress Notes (Addendum)
[facility] [place], [place] [address]  Internal MEDICINE  Office Visit Note  Patient Name: Deanna Rice  [id number]  [id number]  Date of Service: 12/02/2018  Chief Complaint  Patient presents with  . Follow-up    pt needs a new power chair  . Diabetes  . Hypertension    HPI  Pt is here for follow up on DM and HTN.  Her bp is elevated today in office 166/77, which is mostly due to the stress of not having her power chair.  She is essentially stranded and must rely on other people for total care of her ADL's.  The primary complaint at this visit is for mobility evaluation.  She was using an in home power chair that she requires in order facilitate her with her ADLS including; cooking, cleaning, showering/toileting. The patient has Bilateral lowe extremity  Weakness 2/5 on evaluation, therefor she can not use a cane or walker.  She also has upper extremity weakness 1/5 on right and left side.  Her grip strength is 2/5 bilaterally.  We discussed using a Scooter(pov) which she can not use due to lack of postural stability.  She has been safely using a power chair for in and around her home for many years, and I continue to feel this is the best option for her.    Current Medication: Outpatient Encounter Medications as of 12/02/2018  Medication Sig  . ACCU-CHEK SOFTCLIX LANCETS lancets Use as instructed  . ADVAIR DISKUS 250-50 MCG/DOSE AEPB INHALE 1 PUFF BY MOUTH INTO THE LUNGS 2 TIMES A DAY  . allopurinol (ZYLOPRIM) 100 MG tablet TAKE 1 TABLET BY MOUTH DAILY  . amitriptyline (ELAVIL) 25 MG tablet TAKE 2 TABLETS BY MOUTH AT BEDTIME  . aspirin EC 81 MG tablet Take 81 mg by mouth daily.  . COMBIVENT RESPIMAT 20-100 MCG/ACT AERS respimat INHALE 1 PUFF BY MOUTH INTO THE LUNGS EVERY 6 HOURS  . diclofenac sodium (VOLTAREN) 1 % GEL Apply 2 g topically 3 (three) times daily.  Marland Kitchen docusate sodium (COLACE) 100 MG capsule Take 50 mg by mouth daily as needed. Reported on  06/28/2015  . DULoxetine (CYMBALTA) 60 MG capsule TAKE ONE CAPSULE BY MOUTH TWICE A DAY  . fluticasone (FLONASE) 50 MCG/ACT nasal spray PLACE 2 SPRAYS INTO BOTH NOSTRILS DAILY  . folic acid (FOLVITE) 1 MG tablet TAKE 1 TABLET BY MOUTH DAILY  . glucose blood (ACCU-CHEK AVIVA PLUS) test strip Use as instructed  . glucose blood test strip Use as instructed  . insulin aspart (NOVOLOG FLEXPEN) 100 UNIT/ML FlexPen Use as directed with sliding scale maximum 63 units no more  . Insulin Glargine (BASAGLAR KWIKPEN) 100 UNIT/ML SOPN INJECT 50 UNITS SUBCUTANEOUSLY EVERY DAYAND 18 TO 24 UNITS AT BEDTIME  . Insulin Pen Needle (GLOBAL EASE INJECT PEN NEEDLES) 32G X 4 MM MISC Use  As directed  . isosorbide mononitrate (IMDUR) 30 MG 24 hr tablet TAKE 1 TABLET BY MOUTH DAILY  . liraglutide (VICTOZA) 18 MG/3ML SOPN INJECT 1.8 MG EVERY MORNING  . loratadine (CLARITIN) 10 MG tablet TAKE 1 TABLET BY MOUTH DAILY  . Melatonin 10 MG TABS Take 40 mg by mouth at bedtime.  . metoprolol succinate (TOPROL-XL) 25 MG 24 hr tablet TAKE 1 TABLET BY MOUTH DAILY  . Multiple Vitamins-Minerals (MULTIVITAMIN WITH MINERALS) tablet Take 1 tablet by mouth daily.  . nitroGLYCERIN (NITROLINGUAL) 0.4 MG/SPRAY spray PLACE 1 SPRAY UNDER THE TONGUE EVERY 5 MINUTES FOR 3 DOSES AS NEEDED FOR  CHEST PAIN.  Shelle Iron powder APPLY TOPICALLY 6 TIMES DAILY  . omeprazole (PRILOSEC) 40 MG capsule TAKE 1 CAPSULE BY MOUTH DAILY  . OXYGEN Inhale into the lungs. Inhale 4 liters into lungs continously  . Phendimetrazine Tartrate 35 MG TABS Take 1 tablet po bid prior to meals.  . silver sulfADIAZINE (SILVADENE) 1 % cream Apply 1 application topically daily.  . simvastatin (ZOCOR) 80 MG tablet TAKE 1 TABLET BY MOUTH DAILY AT 6 PM  . traMADol (ULTRAM) 50 MG tablet TAKE 1 TABLET BY MOUTH EVERY 12 HOURS ASNEEDED FOR MODERATE PAIN   No facility-administered encounter medications on file as of 12/02/2018.     Surgical History: Past Surgical History:  Procedure  Laterality Date  . TONSILLECTOMY      Medical History: Past Medical History:  Diagnosis Date  . Anemia, unspecified   . CHF (congestive heart failure) (HCC)   . Chronic ischemic heart disease, unspecified   . COPD (chronic obstructive pulmonary disease) (HCC)   . Dependence on supplemental oxygen   . Diabetes mellitus without complication (HCC)   . Gout, unspecified   . Hypertension   . Mixed hyperlipidemia   . 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 Obstructive sleep apnea syndrome   . Peripheral venous insufficiency   . Primary generalized hypertrophic osteoarthrosis   . Primary hypothyroidism   . Severe recurrent major depression with psychotic features (HCC)     Family History: Family History  Problem Relation Age of Onset  . Hypertension Mother   . Diabetes Mother   . CAD Mother   . CAD Father   . Diabetes Father   . Hypertension Father   . Hypertension Sister   . Diabetes Sister   . CAD Sister     Social History   Socioeconomic History  . Marital status: Single    Spouse name: Not on file  . Number of children: Not on file  . Years of education: Not on file  . Highest education level: Not on file  Occupational History  . Not on file  Social Needs  . Financial resource strain: Not on file  . Food insecurity    Worry: Not on file    Inability: Not on file  . Transportation needs    Medical: Not on file    Non-medical: Not on file  Tobacco Use  . Smoking status: Former Games developer  . Smokeless tobacco: Never Used  Substance and Sexual Activity  . Alcohol use: No  . Drug use: No  . Sexual activity: Not on file  Lifestyle  . Physical activity    Days per week: Not on file    Minutes per session: Not on file  . Stress: Not on file  Relationships  . Social Musician on phone: Not on file    Gets together: Not on file    Attends religious service: Not on file    Active member of club or organization: Not on file     Attends meetings of clubs or organizations: Not on file    Relationship status: Not on file  . Intimate partner violence    Fear of current or ex partner: Not on file    Emotionally abused: Not on file    Physically abused: Not on file    Forced sexual activity: Not on file  Other Topics Concern  . Not on file  Social History Narrative  . Not on file  Review of Systems  Constitutional: Negative for chills, fatigue and unexpected weight change.  HENT: Negative for congestion, rhinorrhea, sneezing and sore throat.   Eyes: Negative for photophobia, pain and redness.  Respiratory: Negative for cough, chest tightness and shortness of breath.   Cardiovascular: Negative for chest pain and palpitations.  Gastrointestinal: Negative for abdominal pain, constipation, diarrhea, nausea and vomiting.  Endocrine: Negative.   Genitourinary: Negative for dysuria and frequency.  Musculoskeletal: Negative for arthralgias, back pain, joint swelling and neck pain.  Skin: Negative for rash.  Allergic/Immunologic: Negative.   Neurological: Negative for tremors and numbness.  Hematological: Negative for adenopathy. Does not bruise/bleed easily.  Psychiatric/Behavioral: Negative for behavioral problems and sleep disturbance. The patient is not nervous/anxious.     Vital Signs: BP (!) 166/77   Pulse 80   Temp 97.6 F (36.4 C)   Resp 16   SpO2 98%    Physical Exam Vitals signs and nursing note reviewed.  Constitutional:      General: She is not in acute distress.    Appearance: She is well-developed. She is not diaphoretic.  HENT:     Head: Normocephalic and atraumatic.     Mouth/Throat:     Pharynx: No oropharyngeal exudate.  Eyes:     Pupils: Pupils are equal, round, and reactive to light.  Neck:     Musculoskeletal: Normal range of motion and neck supple.     Thyroid: No thyromegaly.     Vascular: No JVD.     Trachea: No tracheal deviation.  Cardiovascular:     Rate and Rhythm:  Normal rate and regular rhythm.     Heart sounds: Normal heart sounds. No murmur. No friction rub. No gallop.   Pulmonary:     Effort: Pulmonary effort is normal. No respiratory distress.     Breath sounds: Normal breath sounds. No wheezing or rales.  Chest:     Chest wall: No tenderness.  Abdominal:     Palpations: Abdomen is soft.     Tenderness: There is no abdominal tenderness. There is no guarding.  Musculoskeletal: Normal range of motion.  Lymphadenopathy:     Cervical: No cervical adenopathy.  Skin:    General: Skin is warm and dry.  Neurological:     Mental Status: She is alert and oriented to person, place, and time.     Cranial Nerves: No cranial nerve deficit.  Psychiatric:        Behavior: Behavior normal.        Thought Content: Thought content normal.        Judgment: Judgment normal.    Assessment/Plan: 1. Type 2 diabetes mellitus with hyperglycemia, unspecified whether long term insulin use (HCC) Todays A1C 8.1 which is unchanged since last visit.  - POCT HgB A1C  2. Essential hypertension Slightly elevated, pt will continue to take medications.   3. Hyperlipidemia, unspecified hyperlipidemia type Stable, continue present management. Will get lipid panel for next visit.    General Counseling: Deanna Rice verbalizes understanding of the findings of todays visit and agrees with plan of treatment. I have discussed any further diagnostic evaluation that may be needed or ordered today. We also reviewed her medications today. she has been encouraged to call the office with any questions or concerns that should arise related to todays visit.    Orders Placed This Encounter  Procedures  . POCT HgB A1C    No orders of the defined types were placed in this encounter.   Time spent: 25  Minutes   This patient was seen by Blima Ledger AGNP-C in Collaboration with Dr Lyndon Code as a part of collaborative care agreement     Johnna Acosta AGNP-C Internal  medicine

## 2018-12-22 ENCOUNTER — Telehealth: Payer: Self-pay

## 2018-12-22 NOTE — Telephone Encounter (Signed)
PLAN OF CARE SIGNED AND PLACED IN [facility]. °

## 2018-12-27 ENCOUNTER — Other Ambulatory Visit: Payer: Self-pay | Admitting: Adult Health

## 2018-12-28 ENCOUNTER — Telehealth: Payer: Self-pay

## 2018-12-28 ENCOUNTER — Other Ambulatory Visit: Payer: Self-pay | Admitting: Internal Medicine

## 2018-12-28 ENCOUNTER — Other Ambulatory Visit: Payer: Self-pay | Admitting: Nurse Practitioner

## 2018-12-28 DIAGNOSIS — E1165 Type 2 diabetes mellitus with hyperglycemia: Secondary | ICD-10-CM

## 2018-12-28 MED ORDER — GLOBAL EASE INJECT PEN NEEDLES 32G X 4 MM MISC: 5 refills | Status: AC

## 2018-12-28 MED ORDER — NYSTATIN 100000 UNIT/GM EX POWD: CUTANEOUS | 1 refills | Status: DC

## 2018-12-28 NOTE — Telephone Encounter (Signed)
[facility] home health nurse called pt had wound on both leg and left is worsed and red and unable to come in due to transportation issued advised nurse we can see her for virtual visit she talk to pt and call back and talk to beth for virtual visit

## 2018-12-30 ENCOUNTER — Ambulatory Visit: Payer: Medicaid Other | Admitting: Nurse Practitioner

## 2018-12-30 ENCOUNTER — Encounter: Payer: Self-pay | Admitting: Adult Health

## 2018-12-30 ENCOUNTER — Other Ambulatory Visit: Payer: Self-pay

## 2018-12-30 VITALS — BP 124/67 | HR 88 | Temp 97.6°F | Ht 67.0 in | Wt 361.0 lb

## 2018-12-30 DIAGNOSIS — E1165 Type 2 diabetes mellitus with hyperglycemia: Secondary | ICD-10-CM | POA: Diagnosis not present

## 2018-12-30 DIAGNOSIS — L97929 Non-pressure chronic ulcer of unspecified part of left lower leg with unspecified severity: Secondary | ICD-10-CM

## 2018-12-30 DIAGNOSIS — L03116 Cellulitis of left lower limb: Secondary | ICD-10-CM

## 2018-12-30 DIAGNOSIS — E11622 Type 2 diabetes mellitus with other skin ulcer: Secondary | ICD-10-CM | POA: Diagnosis not present

## 2018-12-30 MED ORDER — CEPHALEXIN 500 MG PO CAPS: 500.0000 mg | ORAL_CAPSULE | Freq: Three times a day (TID) | ORAL | 0 refills | Status: DC

## 2018-12-30 MED ORDER — MUPIROCIN 2 % EX OINT: TOPICAL_OINTMENT | CUTANEOUS | 1 refills | Status: DC

## 2018-12-30 NOTE — Progress Notes (Signed)
[facility] [address] [place], [place] [address]  Internal MEDICINE  Telephone Visit  Patient Name: Deanna Rice  [id number]  [id number]  Date of Service: 12/31/2018  I connected with the patient at 4:14pm by webcam and verified the patients identity using two identifiers.   I discussed the limitations, risks, security and privacy concerns of performing an evaluation and management service by webcam and the availability of in person appointments. I also discussed with the patient that there may be a patient responsible charge related to the service.  The patient expressed understanding and agrees to proceed.    Chief Complaint  Patient presents with  . Telephone Assessment  . Telephone Screen  . Medical Management of Chronic Issues    wound on left leg ,hot to touch celluitis     The patient has been contacted via webcam for follow up visit due to concerns for spread of novel coronavirus. The patient states tahat she developed lesion and possible cellulitis to the left lower leg. States that it started a few days ago after she used a wash cloth belonging to a friend. The lesion continues to get swollen, tender, and there is open area in the center which is starting to drain some clear fluid. She has had multiple bouts with cellulitis involving this leg int the past. She deneis fever, chills, body aches, or other worrisome symptoms.       Current Medication: Outpatient Encounter Medications as of 12/30/2018  Medication Sig  . ACCU-CHEK SOFTCLIX LANCETS lancets Use as instructed  . ADVAIR DISKUS 250-50 MCG/DOSE AEPB INHALE 1 PUFF BY MOUTH INTO THE LUNGS 2 TIMES A DAY  . allopurinol (ZYLOPRIM) 100 MG tablet TAKE 1 TABLET BY MOUTH DAILY  . amitriptyline (ELAVIL) 25 MG tablet TAKE 2 TABLETS BY MOUTH AT BEDTIME  . aspirin EC 81 MG tablet Take 81 mg by mouth daily.  . COMBIVENT RESPIMAT 20-100 MCG/ACT AERS respimat INHALE 1 PUFF BY MOUTH INTO THE LUNGS EVERY 6 HOURS  .  diclofenac sodium (VOLTAREN) 1 % GEL Apply 2 g topically 3 (three) times daily.  Marland Kitchen docusate sodium (COLACE) 100 MG capsule Take 50 mg by mouth daily as needed. Reported on 06/28/2015  . DULoxetine (CYMBALTA) 60 MG capsule TAKE ONE CAPSULE BY MOUTH TWICE A DAY  . fluticasone (FLONASE) 50 MCG/ACT nasal spray PLACE 2 SPRAYS INTO BOTH NOSTRILS DAILY  . folic acid (FOLVITE) 1 MG tablet TAKE 1 TABLET BY MOUTH DAILY  . glucose blood (ACCU-CHEK AVIVA PLUS) test strip Use as instructed  . glucose blood test strip Use as instructed  . insulin aspart (NOVOLOG FLEXPEN) 100 UNIT/ML FlexPen Use as directed with sliding scale maximum 63 units no more  . Insulin Glargine (BASAGLAR KWIKPEN) 100 UNIT/ML SOPN INJECT 50 UNITS SUBCUTANEOUSLY EVERY DAYAND 18 TO 24 UNITS AT BEDTIME  . Insulin Pen Needle (GLOBAL EASE INJECT PEN NEEDLES) 32G X 4 MM MISC Use  As directed  . isosorbide mononitrate (IMDUR) 30 MG 24 hr tablet TAKE 1 TABLET BY MOUTH DAILY  . liraglutide (VICTOZA) 18 MG/3ML SOPN INJECT 1.8 MG EVERY MORNING  . loratadine (CLARITIN) 10 MG tablet TAKE 1 TABLET BY MOUTH DAILY  . Melatonin 10 MG TABS Take 40 mg by mouth at bedtime.  . metoprolol succinate (TOPROL-XL) 25 MG 24 hr tablet TAKE 1 TABLET BY MOUTH DAILY  . Multiple Vitamins-Minerals (MULTIVITAMIN WITH MINERALS) tablet Take 1 tablet by mouth daily.  . nitroGLYCERIN (NITROLINGUAL) 0.4 MG/SPRAY spray PLACE 1 SPRAY UNDER  THE TONGUE EVERY 5 MINUTES FOR 3 DOSES AS NEEDED FOR CHEST PAIN.  Marland Kitchen nystatin (NYAMYC) powder APPLY TOPICALLY 6 TIMES DAILY  . omeprazole (PRILOSEC) 40 MG capsule TAKE 1 CAPSULE BY MOUTH DAILY  . OXYGEN Inhale into the lungs. Inhale 4 liters into lungs continously  . Phendimetrazine Tartrate 35 MG TABS Take 1 tablet po bid prior to meals.  . silver sulfADIAZINE (SILVADENE) 1 % cream Apply 1 application topically daily.  . simvastatin (ZOCOR) 80 MG tablet TAKE 1 TABLET BY MOUTH DAILY AT 6 PM  . traMADol (ULTRAM) 50 MG tablet TAKE 1 TABLET BY  MOUTH EVERY 12 HOURS ASNEEDED FOR MODERATE PAIN  . cephALEXin (KEFLEX) 500 MG capsule Take 1 capsule (500 mg total) by mouth 3 (three) times daily.  . mupirocin ointment (BACTROBAN) 2 % Apply small amount to affected area BID   No facility-administered encounter medications on file as of 12/30/2018.     Surgical History: Past Surgical History:  Procedure Laterality Date  . TONSILLECTOMY      Medical History: Past Medical History:  Diagnosis Date  . Anemia, unspecified   . CHF (congestive heart failure) (HCC)   . Chronic ischemic heart disease, unspecified   . COPD (chronic obstructive pulmonary disease) (HCC)   . Dependence on supplemental oxygen   . Diabetes mellitus without complication (HCC)   . Gout, unspecified   . Hypertension   . Mixed hyperlipidemia   . 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 Obstructive sleep apnea syndrome   . Peripheral venous insufficiency   . Primary generalized hypertrophic osteoarthrosis   . Primary hypothyroidism   . Severe recurrent major depression with psychotic features (HCC)     Family History: Family History  Problem Relation Age of Onset  . Hypertension Mother   . Diabetes Mother   . CAD Mother   . CAD Father   . Diabetes Father   . Hypertension Father   . Hypertension Sister   . Diabetes Sister   . CAD Sister     Social History   Socioeconomic History  . Marital status: Single    Spouse name: Not on file  . Number of children: Not on file  . Years of education: Not on file  . Highest education level: Not on file  Occupational History  . Not on file  Social Needs  . Financial resource strain: Not on file  . Food insecurity    Worry: Not on file    Inability: Not on file  . Transportation needs    Medical: Not on file    Non-medical: Not on file  Tobacco Use  . Smoking status: Former Games developer  . Smokeless tobacco: Never Used  Substance and Sexual Activity  . Alcohol use: No  . Drug use: No   . Sexual activity: Not on file  Lifestyle  . Physical activity    Days per week: Not on file    Minutes per session: Not on file  . Stress: Not on file  Relationships  . Social Musician on phone: Not on file    Gets together: Not on file    Attends religious service: Not on file    Active member of club or organization: Not on file    Attends meetings of clubs or organizations: Not on file    Relationship status: Not on file  . Intimate partner violence    Fear of current or ex partner: Not on file  Emotionally abused: Not on file    Physically abused: Not on file    Forced sexual activity: Not on file  Other Topics Concern  . Not on file  Social History Narrative  . Not on file      Review of Systems  Constitutional: Positive for fatigue. Negative for chills, fever and unexpected weight change.  HENT: Negative for congestion, postnasal drip, rhinorrhea, sneezing and sore throat.   Respiratory: Negative for cough, chest tightness, shortness of breath and wheezing.   Cardiovascular: Positive for leg swelling. Negative for chest pain and palpitations.       Left lower leg is swollen.   Gastrointestinal: Negative for abdominal pain, constipation, diarrhea, nausea and vomiting.  Musculoskeletal: Positive for myalgias. Negative for arthralgias, back pain, joint swelling and neck pain.       Left lower leg pain.   Skin: Positive for wound. Negative for rash.       Wound present in front aspect of left lower leg. There is great deal of redness and swelling, it is tender to palpate.   Neurological: Negative for dizziness, tremors and numbness.  Hematological: Negative for adenopathy. Does not bruise/bleed easily.  Psychiatric/Behavioral: Negative for behavioral problems (Depression), sleep disturbance and suicidal ideas. The patient is not nervous/anxious.    Today's Vitals   12/30/18 1608  BP: 124/67  Pulse: 88  Temp: 97.6 F (36.4 C)  SpO2: 100%  Weight: (!)  361 lb (163.7 kg)  Height: 5\' 7"  (1.702 m)   Body mass index is 56.54 kg/m.  Observation/Objective:   The patient is alert and oriented. She is pleasant and answers all questions appropriately. Breathing is non-labored. She is in no acute distress at this time.  There is wound preset on the left lower leg. Very red with area in the center. Of the cellulitis which is open and appears to be draining a small amount of serosanguinous fluid. It is very tender for her or her caregiver to touch the area.    Assessment/Plan: 1. Cellulitis of left lower extremity Start cephalexin 500mg  three times daily for 10 days. Apply bactroban ointment twice daily to affected areas.  - cephALEXin (KEFLEX) 500 MG capsule; Take 1 capsule (500 mg total) by mouth 3 (three) times daily.  Dispense: 30 capsule; Refill: 0 - mupirocin ointment (BACTROBAN) 2 %; Apply small amount to affected area BID  Dispense: 22 g; Refill: 1  2. Diabetic ulcer of left lower leg associated with type 2 diabetes mellitus (HCC) Keep wound clean and dry. Apply bactroban twice daily. May cover as indicated. Verbal order for home health to manage wound care   3. Uncontrolled type 2 diabetes mellitus with hyperglycemia (HCC) Continue diabetic medication as prescribed.   General Counseling: Marga MelnickSandra verbalizes understanding of the findings of today's phone visit and agrees with plan of treatment. I have discussed any further diagnostic evaluation that may be needed or ordered today. We also reviewed her medications today. she has been encouraged to call the office with any questions or concerns that should arise related to todays visit.   This patient was seen by Vincent GrosHeather Sarayah Bacchi FNP Collaboration with Dr Lyndon CodeFozia M Khan as a part of collaborative care agreement  Meds ordered this encounter  Medications  . cephALEXin (KEFLEX) 500 MG capsule    Sig: Take 1 capsule (500 mg total) by mouth 3 (three) times daily.    Dispense:  30 capsule     Refill:  0    Order Specific Question:  Supervising Provider    Answer:   Lavera Guise [[id number]]  . mupirocin ointment (BACTROBAN) 2 %    Sig: Apply small amount to affected area BID    Dispense:  22 g    Refill:  1    Order Specific Question:   Supervising Provider    Answer:   Lavera Guise [[id number]]    Time spent: 52 Minutes    Dr Lavera Guise Internal medicine

## 2018-12-31 DIAGNOSIS — E1165 Type 2 diabetes mellitus with hyperglycemia: Secondary | ICD-10-CM | POA: Insufficient documentation

## 2018-12-31 DIAGNOSIS — L97929 Non-pressure chronic ulcer of unspecified part of left lower leg with unspecified severity: Secondary | ICD-10-CM | POA: Insufficient documentation

## 2018-12-31 DIAGNOSIS — E11622 Type 2 diabetes mellitus with other skin ulcer: Secondary | ICD-10-CM | POA: Insufficient documentation

## 2018-12-31 DIAGNOSIS — L03116 Cellulitis of left lower limb: Secondary | ICD-10-CM | POA: Insufficient documentation

## 2019-01-05 ENCOUNTER — Encounter: Payer: Self-pay | Admitting: Emergency Medicine

## 2019-01-05 ENCOUNTER — Emergency Department
Admission: EM | Admit: 2019-01-05 | Discharge: 2019-01-05 | Disposition: A | Discharge status: Home / Self Care | Payer: Medicaid Other | Attending: Student in an Organized Health Care Education/Training Program | Admitting: Student in an Organized Health Care Education/Training Program

## 2019-01-05 ENCOUNTER — Ambulatory Visit: Payer: Medicaid Other | Admitting: Adult Health

## 2019-01-05 ENCOUNTER — Other Ambulatory Visit: Payer: Self-pay

## 2019-01-05 DIAGNOSIS — E1165 Type 2 diabetes mellitus with hyperglycemia: Secondary | ICD-10-CM | POA: Diagnosis not present

## 2019-01-05 DIAGNOSIS — R4182 Altered mental status, unspecified: Secondary | ICD-10-CM | POA: Diagnosis present

## 2019-01-05 DIAGNOSIS — Z794 Long term (current) use of insulin: Secondary | ICD-10-CM | POA: Insufficient documentation

## 2019-01-05 DIAGNOSIS — J449 Chronic obstructive pulmonary disease, unspecified: Secondary | ICD-10-CM | POA: Diagnosis not present

## 2019-01-05 DIAGNOSIS — Z9981 Dependence on supplemental oxygen: Secondary | ICD-10-CM | POA: Diagnosis not present

## 2019-01-05 DIAGNOSIS — I10 Essential (primary) hypertension: Secondary | ICD-10-CM | POA: Diagnosis not present

## 2019-01-05 DIAGNOSIS — Z79899 Other long term (current) drug therapy: Secondary | ICD-10-CM | POA: Diagnosis not present

## 2019-01-05 DIAGNOSIS — Z87891 Personal history of nicotine dependence: Secondary | ICD-10-CM | POA: Insufficient documentation

## 2019-01-05 DIAGNOSIS — R739 Hyperglycemia, unspecified: Secondary | ICD-10-CM

## 2019-01-05 LAB — CBC
HCT: 32 % — ABNORMAL LOW (ref 36.0–46.0)
Hemoglobin: 10.5 g/dL — ABNORMAL LOW (ref 12.0–15.0)
MCH: 28.9 pg (ref 26.0–34.0)
MCHC: 32.8 g/dL (ref 30.0–36.0)
MCV: 88.2 fL (ref 80.0–100.0)
Platelets: 167 10*3/uL (ref 150–400)
RBC: 3.63 MIL/uL — ABNORMAL LOW (ref 3.87–5.11)
RDW: 15.6 % — ABNORMAL HIGH (ref 11.5–15.5)
WBC: 8.6 10*3/uL (ref 4.0–10.5)
nRBC: 0 % (ref 0.0–0.2)

## 2019-01-05 LAB — BASIC METABOLIC PANEL
Anion gap: 9 (ref 5–15)
BUN: 24 mg/dL — ABNORMAL HIGH (ref 8–23)
CO2: 27 mmol/L (ref 22–32)
Calcium: 8.3 mg/dL — ABNORMAL LOW (ref 8.9–10.3)
Chloride: 99 mmol/L (ref 98–111)
Creatinine, Ser: 1.28 mg/dL — ABNORMAL HIGH (ref 0.44–1.00)
GFR calc Af Amer: 51 mL/min — ABNORMAL LOW (ref >60)
GFR calc non Af Amer: 44 mL/min — ABNORMAL LOW (ref >60)
Glucose, Bld: 281 mg/dL — ABNORMAL HIGH (ref 70–99)
Potassium: 3.8 mmol/L (ref 3.5–5.1)
Sodium: 135 mmol/L (ref 135–145)

## 2019-01-05 LAB — GLUCOSE, CAPILLARY
Glucose-Capillary: 277 mg/dL — ABNORMAL HIGH (ref 70–99)
Glucose-Capillary: 303 mg/dL — ABNORMAL HIGH (ref 70–99)

## 2019-01-05 MED ORDER — INSULIN ASPART 100 UNIT/ML ~~LOC~~ SOLN
6.0000 [IU] | Freq: Once | SUBCUTANEOUS | Status: AC
  Administered 2019-01-05: 6 [IU] via SUBCUTANEOUS
  Filled 2019-01-05: qty 1

## 2019-01-05 NOTE — ED Triage Notes (Signed)
Pt from home via EMS , c/o hyperglycemia. EMS states glucose 354. Per friend states pt has been somewhat confused today per EMS. PT is A&OX4, states " I feel better now". VS

## 2019-01-05 NOTE — ED Provider Notes (Signed)
[facility] Emergency Department Provider Note    First MD Initiated Contact with Patient 01/05/19 1915     (approximate)  I have reviewed the triage vital signs and the nursing notes.   HISTORY  Chief Complaint Altered Mental Status    HPI Deanna Rice is a 63 y.o. female close medical history presents the ER for evaluation of brief episode of confusion earlier today.  States that she was worried that it might be related to her blood sugar or that her oxygen cannula was not working as she is sometimes found that she gets confused when there is a leak and that.  Once they reconnected she starts feeling much better.  She currently denies any pain or discomfort.  No confusion.  No headache.  No blurry vision.  No report of any facial droop numbness or tingling.  Denies any nausea vomiting.  No fevers.    Past Medical History:  Diagnosis Date  . Anemia, unspecified   . CHF (congestive heart failure) (HCC)   . Chronic ischemic heart disease, unspecified   . COPD (chronic obstructive pulmonary disease) (HCC)   . Dependence on supplemental oxygen   . Diabetes mellitus without complication (HCC)   . Gout, unspecified   . Hypertension   . Mixed hyperlipidemia   . 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 Obstructive sleep apnea syndrome   . Peripheral venous insufficiency   . Primary generalized hypertrophic osteoarthrosis   . Primary hypothyroidism   . Severe recurrent major depression with psychotic features (HCC)    Family History  Problem Relation Age of Onset  . Hypertension Mother   . Diabetes Mother   . CAD Mother   . CAD Father   . Diabetes Father   . Hypertension Father   . Hypertension Sister   . Diabetes Sister   . CAD Sister    Past Surgical History:  Procedure Laterality Date  . TONSILLECTOMY     Patient Active Problem List   Diagnosis Date Noted  . Cellulitis of left lower extremity 12/31/2018  . Diabetic  ulcer of left lower leg associated with type 2 diabetes mellitus (HCC) 12/31/2018  . Uncontrolled type 2 diabetes mellitus with hyperglycemia (HCC) 12/31/2018  . Chronic pain syndrome 08/03/2017  . Oxygen dependent 08/03/2017  . Thrombocytopenia (HCC) 09/28/2015  . Anemia in chronic kidney disease 09/28/2015  . Diarrhea 11/28/2014  . AKI (acute kidney injury) (HCC) 11/28/2014  . COPD (chronic obstructive pulmonary disease) (HCC) 11/28/2014  . Uncontrolled type 2 diabetes mellitus with hypoglycemia (HCC) 11/28/2014  . HTN (hypertension) 11/28/2014      Prior to Admission medications   Medication Sig Start Date End Date Taking? Authorizing Provider  ACCU-CHEK SOFTCLIX LANCETS lancets Use as instructed 04/28/17   Carlean Jews, NP  ADVAIR DISKUS 250-50 MCG/DOSE AEPB INHALE 1 PUFF BY MOUTH INTO THE LUNGS 2 TIMES A DAY 11/25/18   Johnna Acosta, NP  allopurinol (ZYLOPRIM) 100 MG tablet TAKE 1 TABLET BY MOUTH DAILY 11/25/18   Johnna Acosta, NP  amitriptyline (ELAVIL) 25 MG tablet TAKE 2 TABLETS BY MOUTH AT BEDTIME 11/25/18   Johnna Acosta, NP  aspirin EC 81 MG tablet Take 81 mg by mouth daily.    [provider]  cephALEXin (KEFLEX) 500 MG capsule Take 1 capsule (500 mg total) by mouth 3 (three) times daily. 12/30/18   Boscia, Kathlynn Grate, NP  COMBIVENT RESPIMAT 20-100 MCG/ACT AERS respimat INHALE 1 PUFF BY  MOUTH INTO THE LUNGS EVERY 6 HOURS 11/25/18   Ronnell Freshwater, NP  diclofenac sodium (VOLTAREN) 1 % GEL Apply 2 g topically 3 (three) times daily. 03/26/17   Ronnell Freshwater, NP  docusate sodium (COLACE) 100 MG capsule Take 50 mg by mouth daily as needed. Reported on 06/28/2015    [provider]  DULoxetine (CYMBALTA) 60 MG capsule TAKE ONE CAPSULE BY MOUTH TWICE A DAY 11/25/18   Kendell Bane, NP  fluticasone [facility]) 50 MCG/ACT nasal spray PLACE 2 SPRAYS INTO BOTH NOSTRILS DAILY 11/25/18   Kendell Bane, NP  folic acid (FOLVITE) 1 MG tablet TAKE 1 TABLET BY  MOUTH DAILY 11/25/18   Kendell Bane, NP  glucose blood (ACCU-CHEK AVIVA PLUS) test strip Use as instructed 11/14/17   Kendell Bane, NP  glucose blood test strip Use as instructed 10/22/18   Kendell Bane, NP  insulin aspart (NOVOLOG FLEXPEN) 100 UNIT/ML FlexPen Use as directed with sliding scale maximum 63 units no more 03/26/18   Ronnell Freshwater, NP  Insulin Glargine (BASAGLAR KWIKPEN) 100 UNIT/ML SOPN INJECT 50 UNITS SUBCUTANEOUSLY EVERY DAYAND 18 TO 24 UNITS AT BEDTIME 11/26/18   Kendell Bane, NP  Insulin Pen Needle (GLOBAL EASE INJECT PEN NEEDLES) 32G X 4 MM MISC Use  As directed 12/28/18   Ronnell Freshwater, NP  isosorbide mononitrate (IMDUR) 30 MG 24 hr tablet TAKE 1 TABLET BY MOUTH DAILY 11/25/18   Kendell Bane, NP  liraglutide (VICTOZA) 18 MG/3ML SOPN INJECT 1.8 MG EVERY MORNING 11/25/18   Kendell Bane, NP  loratadine (CLARITIN) 10 MG tablet TAKE 1 TABLET BY MOUTH DAILY 11/25/18   Kendell Bane, NP  Melatonin 10 MG TABS Take 40 mg by mouth at bedtime.    [provider]  metoprolol succinate (TOPROL-XL) 25 MG 24 hr tablet TAKE 1 TABLET BY MOUTH DAILY 11/25/18   Kendell Bane, NP  Multiple Vitamins-Minerals (MULTIVITAMIN WITH MINERALS) tablet Take 1 tablet by mouth daily.    [provider]  mupirocin ointment (BACTROBAN) 2 % Apply small amount to affected area BID 12/30/18   Boscia, Heather E, NP  nitroGLYCERIN (NITROLINGUAL) 0.4 MG/SPRAY spray PLACE 1 SPRAY UNDER THE TONGUE EVERY 5 MINUTES FOR 3 DOSES AS NEEDED FOR CHEST PAIN. 06/19/18   Ronnell Freshwater, NP  nystatin [facility]) powder APPLY TOPICALLY 6 TIMES DAILY 12/28/18   Ronnell Freshwater, NP  omeprazole (PRILOSEC) 40 MG capsule TAKE 1 CAPSULE BY MOUTH DAILY 11/25/18   Kendell Bane, NP  OXYGEN Inhale into the lungs. Inhale 4 liters into lungs continously    [provider]  Phendimetrazine Tartrate 35 MG TABS Take 1 tablet po bid prior to meals. 04/21/18   Kendell Bane, NP  silver  sulfADIAZINE (SILVADENE) 1 % cream Apply 1 application topically daily. 06/05/17   Lavera Guise, MD  simvastatin (ZOCOR) 80 MG tablet TAKE 1 TABLET BY MOUTH DAILY AT 6 PM 11/25/18   Kendell Bane, NP  traMADol (ULTRAM) 50 MG tablet TAKE 1 TABLET BY MOUTH EVERY 12 HOURS ASNEEDED FOR MODERATE PAIN 10/27/18   Lavera Guise, MD    Allergies Patient has no known allergies.    Social History Social History   Tobacco Use  . Smoking status: Former Research scientist (life sciences)  . Smokeless tobacco: Never Used  Substance Use Topics  . Alcohol use: No  . Drug use: No    Review of Systems Patient denies headaches, rhinorrhea, blurry  vision, numbness, shortness of breath, chest pain, edema, cough, abdominal pain, nausea, vomiting, diarrhea, dysuria, fevers, rashes or hallucinations unless otherwise stated above in HPI. ____________________________________________   PHYSICAL EXAM:  VITAL SIGNS: Vitals:   01/05/19 1742  BP: (!) 170/64  Pulse: 90  Resp: 16  Temp: 98.1 F (36.7 C)  SpO2: 97%    Constitutional: Alert and oriented. pleasant Eyes: Conjunctivae are normal.  Head: Atraumatic. Nose: No congestion/rhinnorhea. Mouth/Throat: Mucous membranes are moist.   Neck: No stridor. Painless ROM.  Cardiovascular: Normal rate, regular rhythm. Grossly normal heart sounds.  Good peripheral circulation. Respiratory: Normal respiratory effort.  No retractions. Lungs with scattered wheeze but with good airmovement throughout Gastrointestinal: obese, Soft and nontender. No distention. No abdominal bruits. No CVA tenderness. Genitourinary:  Musculoskeletal: chronic venous stasis changes to BLE. No warmth, crepitus or drainage.  No joint effusions. Neurologic:  CN- intact.  No facial droop, Normal FNF.  Normal heel to shin.  Sensation intact bilaterally. Normal speech and language. No gross focal neurologic deficits are appreciated.  Skin:  Skin is warm, dry and intact. No rash noted. Psychiatric: Mood and affect  are normal. Speech and behavior are normal.  ____________________________________________   LABS (all labs ordered are listed, but only abnormal results are displayed)  Results for orders placed or performed during the hospital encounter of 01/05/19 (from the past 24 hour(s))  Basic metabolic panel     Status: Abnormal   Collection Time: 01/05/19  5:44 PM  Result Value Ref Range   Sodium 135 135 - 145 mmol/L   Potassium 3.8 3.5 - 5.1 mmol/L   Chloride 99 98 - 111 mmol/L   CO2 27 22 - 32 mmol/L   Glucose, Bld 281 (H) 70 - 99 mg/dL   BUN 24 (H) 8 - 23 mg/dL   Creatinine, Ser 1.61 (H) 0.44 - 1.00 mg/dL   Calcium 8.3 (L) 8.9 - 10.3 mg/dL   GFR calc non Af Amer 44 (L) >60 mL/min   GFR calc Af Amer 51 (L) >60 mL/min   Anion gap 9 5 - 15  CBC     Status: Abnormal   Collection Time: 01/05/19  5:44 PM  Result Value Ref Range   WBC 8.6 4.0 - 10.5 K/uL   RBC 3.63 (L) 3.87 - 5.11 MIL/uL   Hemoglobin 10.5 (L) 12.0 - 15.0 g/dL   HCT 09.6 (L) 04.5 - 40.9 %   MCV 88.2 80.0 - 100.0 fL   MCH 28.9 26.0 - 34.0 pg   MCHC 32.8 30.0 - 36.0 g/dL   RDW 81.1 (H) 91.4 - 78.2 %   Platelets 167 150 - 400 K/uL   nRBC 0.0 0.0 - 0.2 %  Glucose, capillary     Status: Abnormal   Collection Time: 01/05/19  5:46 PM  Result Value Ref Range   Glucose-Capillary 277 (H) 70 - 99 mg/dL  Glucose, capillary     Status: Abnormal   Collection Time: 01/05/19  7:23 PM  Result Value Ref Range   Glucose-Capillary 303 (H) 70 - 99 mg/dL   ____________________________________________________  RADIOLOGY   ____________________________________________   PROCEDURES  Procedure(s) performed:  Procedures    Critical Care performed: no ____________________________________________   INITIAL IMPRESSION / ASSESSMENT AND PLAN / ED COURSE  Pertinent labs & imaging results that were available during my care of the patient were reviewed by me and considered in my medical decision making (see chart for details).    DDX: Dehydration, electrolyte abnormality, hypoglycemia, AKI, TIA,  hypoxia  Deanna Rice is a 63 y.o. who presents to the ED with symptoms as described above.  Patient well-appearing currently.  No LOC.  Symptoms seem to have resolved.  Was brief episode.  She is denying any symptoms to suggest TIA or CVA.  Has reassuring exam.  No evidence of hypoxia right now.  Denies any cough or symptoms to suggest COPD exacerbation or pneumonia.  Mildly elevated glucose.  Will give dose of her insulin and reassess.  Blood work otherwise reassuring.  Clinical Course as of Jan 05 2024  Tue Jan 05, 2019  2022 Patient reassessed and is requesting discharge home.  States that she feels well.  Sugars are still little bit elevated but she did receive her insulin and has her home insulin to take.  She is able to ambulate with steady gait.  No neuro deficits.  States that she does have working oxygen at home.  At this point do not feel that further diagnostic testing is clinically indicated she is alert and oriented x3 with no deficits and no other symptoms at this time appropriate for outpatient follow-up   [PR]    Clinical Course User Index [PR] Willy Eddyobinson, Celesta Funderburk, MD    The patient was evaluated in Emergency Department today for the symptoms described in the history of present illness. He/she was evaluated in the context of the global COVID-19 pandemic, which necessitated consideration that the patient might be at risk for infection with the SARS-CoV-2 virus that causes COVID-19. Institutional protocols and algorithms that pertain to the evaluation of patients at risk for COVID-19 are in a state of rapid change based on information released by regulatory bodies including the CDC and federal and state organizations. These policies and algorithms were followed during the patient's care in the ED.  As part of my medical decision making, I reviewed the following data within the electronic MEDICAL RECORD NUMBER Nursing notes  reviewed and incorporated, Labs reviewed, notes from prior ED visits and [place] Controlled Substance Database   ____________________________________________   FINAL CLINICAL IMPRESSION(S) / ED DIAGNOSES  Final diagnoses:  Hyperglycemia      NEW MEDICATIONS STARTED DURING THIS VISIT:  New Prescriptions   No medications on file     Note:  This document was prepared using Dragon voice recognition software and may include unintentional dictation errors.    Willy Eddyobinson, Marina Desire, MD 01/05/19 2025

## 2019-01-12 ENCOUNTER — Telehealth: Payer: Self-pay

## 2019-01-12 NOTE — Telephone Encounter (Signed)
Faxed RX and last 2 office notes for [facility] to [facility] for power wheelchair. [name]

## 2019-01-12 NOTE — Telephone Encounter (Signed)
Patient called  In regards to her power wheelchair paperwork. Spoke with [name] she Is handling the paperwork and advised patient it would be faxed by the end of today. Patient also asked about her insulin with it needing a prior auth and did inform patient it was being taken care of today.

## 2019-01-18 ENCOUNTER — Telehealth: Payer: Self-pay

## 2019-01-18 NOTE — Telephone Encounter (Signed)
[facility] CALLED TO GET ETIOLOGY FOR PT WOUND. PER [facility] NOTES NOTIFIED LYNN THAT IT IS CELLULITIS OF LEFT LOWER EXTREMITY.

## 2019-01-29 ENCOUNTER — Telehealth: Payer: Self-pay

## 2019-01-29 NOTE — Telephone Encounter (Signed)
Confirmed appointment with patient. klh °

## 2019-02-01 ENCOUNTER — Telehealth: Payer: Self-pay

## 2019-02-01 NOTE — Telephone Encounter (Signed)
Patient called and canceled appt for 02/02/19. Deanna Rice

## 2019-02-02 ENCOUNTER — Ambulatory Visit: Payer: Medicaid Other | Admitting: Adult Health

## 2019-02-15 ENCOUNTER — Other Ambulatory Visit: Payer: Self-pay | Admitting: Internal Medicine

## 2019-02-22 ENCOUNTER — Telehealth: Payer: Self-pay

## 2019-02-22 NOTE — Telephone Encounter (Signed)
Confirmed appointment with patient. klh °

## 2019-02-23 ENCOUNTER — Other Ambulatory Visit: Payer: Self-pay

## 2019-02-23 ENCOUNTER — Encounter: Payer: Self-pay | Admitting: Nurse Practitioner

## 2019-02-23 ENCOUNTER — Ambulatory Visit: Payer: Medicaid Other | Admitting: Nurse Practitioner

## 2019-02-23 VITALS — Ht 67.0 in

## 2019-02-23 DIAGNOSIS — E1165 Type 2 diabetes mellitus with hyperglycemia: Secondary | ICD-10-CM

## 2019-02-23 DIAGNOSIS — N764 Abscess of vulva: Secondary | ICD-10-CM | POA: Diagnosis not present

## 2019-02-23 DIAGNOSIS — I10 Essential (primary) hypertension: Secondary | ICD-10-CM | POA: Diagnosis not present

## 2019-02-23 MED ORDER — LIDOCAINE VISCOUS HCL 2 % MT SOLN: OROMUCOSAL | 1 refills | Status: DC

## 2019-02-23 MED ORDER — SULFAMETHOXAZOLE-TRIMETHOPRIM 800-160 MG PO TABS: 1.0000 | ORAL_TABLET | Freq: Two times a day (BID) | ORAL | 0 refills | Status: DC

## 2019-02-23 NOTE — Progress Notes (Signed)
[facility] [address] [place], [place] [address]  Internal MEDICINE  Telephone Visit  Patient Name: Deanna Rice  [id number]  [id number]  Date of Service: 02/28/2019  I connected with the patient at 1:08pm by telephone and verified the patients identity using two identifiers.   I discussed the limitations, risks, security and privacy concerns of performing an evaluation and management service by telephone and the availability of in person appointments. I also discussed with the patient that there may be a patient responsible charge related to the service.  The patient expressed understanding and agrees to proceed.    Chief Complaint  Patient presents with  . Telephone Assessment  . Telephone Screen  . Recurrent Skin Infections    3 boils on outside surface of vagina, causing her a lot of pian, they subside and come back constantly     The patient has been contacted via telephone for follow up visit due to concerns for spread of novel coronavirus. The patient has noted three boils in her "private area." she states she has at least three of them. She states they build up with fluid, then get smaller. She has not noted any drainage. States they are so painful, she can hardly sit up. She states that she has been using lidocaine muscle cream on the painful areas just make it comfortable enough for her to sit up. States that she has developed a fissure due to the presence of the boils. She states that they have been present for about two weeks. In the past, when she has noted them, they resolved on their own without medication.       Current Medication: Outpatient Encounter Medications as of 02/23/2019  Medication Sig  . ACCU-CHEK SOFTCLIX LANCETS lancets Use as instructed  . ADVAIR DISKUS 250-50 MCG/DOSE AEPB INHALE 1 PUFF BY MOUTH INTO THE LUNGS 2 TIMES A DAY  . allopurinol (ZYLOPRIM) 100 MG tablet TAKE 1 TABLET BY MOUTH DAILY  . amitriptyline (ELAVIL) 25 MG tablet TAKE 2  TABLETS BY MOUTH AT BEDTIME  . aspirin EC 81 MG tablet Take 81 mg by mouth daily.  . cephALEXin (KEFLEX) 500 MG capsule Take 1 capsule (500 mg total) by mouth 3 (three) times daily.  . COMBIVENT RESPIMAT 20-100 MCG/ACT AERS respimat INHALE 1 PUFF BY MOUTH INTO THE LUNGS EVERY 6 HOURS  . diclofenac sodium (VOLTAREN) 1 % GEL Apply 2 g topically 3 (three) times daily.  Marland Kitchen docusate sodium (COLACE) 100 MG capsule Take 50 mg by mouth daily as needed. Reported on 06/28/2015  . DULoxetine (CYMBALTA) 60 MG capsule TAKE ONE CAPSULE BY MOUTH TWICE A DAY  . fluticasone (FLONASE) 50 MCG/ACT nasal spray PLACE 2 SPRAYS INTO BOTH NOSTRILS DAILY  . folic acid (FOLVITE) 1 MG tablet TAKE 1 TABLET BY MOUTH DAILY  . glucose blood (ACCU-CHEK AVIVA PLUS) test strip Use as instructed  . glucose blood test strip Use as instructed  . insulin aspart (NOVOLOG FLEXPEN) 100 UNIT/ML FlexPen Use as directed with sliding scale maximum 63 units no more  . Insulin Glargine (BASAGLAR KWIKPEN) 100 UNIT/ML SOPN INJECT 50 UNITS SUBCUTANEOUSLY EVERY DAYAND 18 TO 24 UNITS AT BEDTIME  . Insulin Pen Needle (GLOBAL EASE INJECT PEN NEEDLES) 32G X 4 MM MISC Use  As directed  . isosorbide mononitrate (IMDUR) 30 MG 24 hr tablet TAKE 1 TABLET BY MOUTH DAILY  . liraglutide (VICTOZA) 18 MG/3ML SOPN INJECT 1.8 MG EVERY MORNING  . loratadine (CLARITIN) 10 MG tablet TAKE 1  TABLET BY MOUTH DAILY  . Melatonin 10 MG TABS Take 40 mg by mouth at bedtime.  . metoprolol succinate (TOPROL-XL) 25 MG 24 hr tablet TAKE 1 TABLET BY MOUTH DAILY  . Multiple Vitamins-Minerals (MULTIVITAMIN WITH MINERALS) tablet Take 1 tablet by mouth daily.  . mupirocin ointment (BACTROBAN) 2 % Apply small amount to affected area BID  . nitroGLYCERIN (NITROLINGUAL) 0.4 MG/SPRAY spray PLACE 1 SPRAY UNDER THE TONGUE EVERY 5 MINUTES FOR 3 DOSES AS NEEDED FOR CHEST PAIN.  Marland Kitchen nystatin (NYAMYC) powder APPLY TOPICALLY 6 TIMES DAILY  . omeprazole (PRILOSEC) 40 MG capsule TAKE 1 CAPSULE BY  MOUTH DAILY  . OXYGEN Inhale into the lungs. Inhale 4 liters into lungs continously  . Phendimetrazine Tartrate 35 MG TABS Take 1 tablet po bid prior to meals.  . silver sulfADIAZINE (SILVADENE) 1 % cream APPLY 1 APPLICATION TOPICALLY DAILY  . simvastatin (ZOCOR) 80 MG tablet TAKE 1 TABLET BY MOUTH DAILY AT 6 PM  . [DISCONTINUED] traMADol (ULTRAM) 50 MG tablet TAKE 1 TABLET BY MOUTH EVERY 12 HOURS ASNEEDED FOR MODERATE PAIN  . lidocaine (XYLOCAINE) 2 % solution Apply small amount to affected areas three to four times daily prn pain.  Marland Kitchen sulfamethoxazole-trimethoprim (BACTRIM DS) 800-160 MG tablet Take 1 tablet by mouth 2 (two) times daily.   No facility-administered encounter medications on file as of 02/23/2019.    Surgical History: Past Surgical History:  Procedure Laterality Date  . TONSILLECTOMY      Medical History: Past Medical History:  Diagnosis Date  . Anemia, unspecified   . CHF (congestive heart failure) (HCC)   . Chronic ischemic heart disease, unspecified   . COPD (chronic obstructive pulmonary disease) (HCC)   . Dependence on supplemental oxygen   . Diabetes mellitus without complication (HCC)   . Gout, unspecified   . Hypertension   . Mixed hyperlipidemia   . 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 Obstructive sleep apnea syndrome   . Peripheral venous insufficiency   . Primary generalized hypertrophic osteoarthrosis   . Primary hypothyroidism   . Severe recurrent major depression with psychotic features (HCC)     Family History: Family History  Problem Relation Age of Onset  . Hypertension Mother   . Diabetes Mother   . CAD Mother   . CAD Father   . Diabetes Father   . Hypertension Father   . Hypertension Sister   . Diabetes Sister   . CAD Sister     Social History   Socioeconomic History  . Marital status: Single    Spouse name: Not on file  . Number of children: Not on file  . Years of education: Not on file  . Highest  education level: Not on file  Occupational History  . Not on file  Tobacco Use  . Smoking status: Former Games developer  . Smokeless tobacco: Never Used  Substance and Sexual Activity  . Alcohol use: No  . Drug use: No  . Sexual activity: Not on file  Other Topics Concern  . Not on file  Social History Narrative  . Not on file   Social Determinants of Health   Financial Resource Strain:   . Difficulty of Paying Living Expenses: Not on file  Food Insecurity:   . Worried About Programme researcher, broadcasting/film/video in the Last Year: Not on file  . Ran Out of Food in the Last Year: Not on file  Transportation Needs:   . Lack of Transportation (Medical): Not on  file  . Lack of Transportation (Non-Medical): Not on file  Physical Activity:   . Days of Exercise per Week: Not on file  . Minutes of Exercise per Session: Not on file  Stress:   . Feeling of Stress : Not on file  Social Connections:   . Frequency of Communication with Friends and Family: Not on file  . Frequency of Social Gatherings with Friends and Family: Not on file  . Attends Religious Services: Not on file  . Active Member of Clubs or Organizations: Not on file  . Attends BankerClub or Organization Meetings: Not on file  . Marital Status: Not on file  Intimate Partner Violence:   . Fear of Current or Ex-Partner: Not on file  . Emotionally Abused: Not on file  . Physically Abused: Not on file  . Sexually Abused: Not on file      Review of Systems  Constitutional: Negative for chills, fatigue and unexpected weight change.  HENT: Positive for postnasal drip. Negative for congestion, rhinorrhea, sneezing and sore throat.   Eyes: Negative for redness.  Respiratory: Negative for cough, chest tightness and shortness of breath.   Cardiovascular: Negative for chest pain and palpitations.  Gastrointestinal: Negative for abdominal pain, constipation, diarrhea, nausea and vomiting.  Genitourinary: Negative for dysuria and frequency.       Boils  present in "private area." very sore. Hurts to sit. Feels as though she has also developed a vaginal fissure due to all of the irritation.   Musculoskeletal: Negative for arthralgias, back pain, joint swelling and neck pain.  Skin: Negative for rash.  Neurological: Negative for dizziness, tremors, numbness and headaches.  Hematological: Negative for adenopathy. Does not bruise/bleed easily.  Psychiatric/Behavioral: Negative for behavioral problems (Depression), sleep disturbance and suicidal ideas. The patient is nervous/anxious.    Today's Vitals   02/23/19 1205  Height: 5\' 7"  (1.702 m)   Body mass index is 56.54 kg/m.  Observation/Objective:   The patient is alert and oriented. She is pleasant and answers all questions appropriately. Breathing is non-labored. She is in no acute distress at this time. The patient sounds worried on the telephone.    Assessment/Plan: 1. Furuncle of vulva Start bactrim DS twice daily for 14 days. May apply viscus lidocaine to affected area three times daily as needed for pain. If worse, may need referral to general surgery for incision and drainage.  - sulfamethoxazole-trimethoprim (BACTRIM DS) 800-160 MG tablet; Take 1 tablet by mouth 2 (two) times daily.  Dispense: 28 tablet; Refill: 0 - lidocaine (XYLOCAINE) 2 % solution; Apply small amount to affected areas three to four times daily prn pain.  Dispense: 100 mL; Refill: 1  2. Uncontrolled type 2 diabetes mellitus with hyperglycemia (HCC) Continue diabetic medication as prescribed  3. Essential hypertension Stable. Continue bp medication as prescribed   General Counseling: Deanna Rice verbalizes understanding of the findings of today's phone visit and agrees with plan of treatment. I have discussed any further diagnostic evaluation that may be needed or ordered today. We also reviewed her medications today. she has been encouraged to call the office with any questions or concerns that should arise related  to todays visit.   This patient was seen by Vincent GrosHeather Alida Greiner FNP Collaboration with Dr Lyndon CodeFozia M Khan as a part of collaborative care agreement  Meds ordered this encounter  Medications  . sulfamethoxazole-trimethoprim (BACTRIM DS) 800-160 MG tablet    Sig: Take 1 tablet by mouth 2 (two) times daily.    Dispense:  28 tablet    Refill:  0    Order Specific Question:   Supervising Provider    Answer:   Lavera Guise [[id number]]  . lidocaine (XYLOCAINE) 2 % solution    Sig: Apply small amount to affected areas three to four times daily prn pain.    Dispense:  100 mL    Refill:  1    Order Specific Question:   Supervising Provider    Answer:   Lavera Guise [[id number]]    Time spent: 49 Minutes    Dr Lavera Guise Internal medicine

## 2019-02-24 ENCOUNTER — Other Ambulatory Visit: Payer: Self-pay | Admitting: Internal Medicine

## 2019-02-24 DIAGNOSIS — G894 Chronic pain syndrome: Secondary | ICD-10-CM

## 2019-02-25 ENCOUNTER — Other Ambulatory Visit: Payer: Self-pay

## 2019-02-25 DIAGNOSIS — Z5320 Procedure and treatment not carried out because of patient's decision for unspecified reasons: Secondary | ICD-10-CM | POA: Insufficient documentation

## 2019-02-25 DIAGNOSIS — Z7982 Long term (current) use of aspirin: Secondary | ICD-10-CM | POA: Insufficient documentation

## 2019-02-25 DIAGNOSIS — Z794 Long term (current) use of insulin: Secondary | ICD-10-CM | POA: Diagnosis not present

## 2019-02-25 DIAGNOSIS — J449 Chronic obstructive pulmonary disease, unspecified: Secondary | ICD-10-CM | POA: Diagnosis not present

## 2019-02-25 DIAGNOSIS — I509 Heart failure, unspecified: Secondary | ICD-10-CM | POA: Insufficient documentation

## 2019-02-25 DIAGNOSIS — I110 Hypertensive heart disease with heart failure: Secondary | ICD-10-CM | POA: Insufficient documentation

## 2019-02-25 DIAGNOSIS — Z87891 Personal history of nicotine dependence: Secondary | ICD-10-CM | POA: Diagnosis not present

## 2019-02-25 DIAGNOSIS — E119 Type 2 diabetes mellitus without complications: Secondary | ICD-10-CM | POA: Diagnosis not present

## 2019-02-25 DIAGNOSIS — Z79899 Other long term (current) drug therapy: Secondary | ICD-10-CM | POA: Insufficient documentation

## 2019-02-25 DIAGNOSIS — E039 Hypothyroidism, unspecified: Secondary | ICD-10-CM | POA: Diagnosis not present

## 2019-02-25 DIAGNOSIS — R4182 Altered mental status, unspecified: Secondary | ICD-10-CM | POA: Insufficient documentation

## 2019-02-25 LAB — COMPREHENSIVE METABOLIC PANEL
ALT: 24 U/L (ref 0–44)
AST: 25 U/L (ref 15–41)
Albumin: 3.2 g/dL — ABNORMAL LOW (ref 3.5–5.0)
Alkaline Phosphatase: 67 U/L (ref 38–126)
Anion gap: 10 (ref 5–15)
BUN: 34 mg/dL — ABNORMAL HIGH (ref 8–23)
CO2: 26 mmol/L (ref 22–32)
Calcium: 8.4 mg/dL — ABNORMAL LOW (ref 8.9–10.3)
Chloride: 100 mmol/L (ref 98–111)
Creatinine, Ser: 1.64 mg/dL — ABNORMAL HIGH (ref 0.44–1.00)
GFR calc Af Amer: 38 mL/min — ABNORMAL LOW (ref >60)
GFR calc non Af Amer: 33 mL/min — ABNORMAL LOW (ref >60)
Glucose, Bld: 261 mg/dL — ABNORMAL HIGH (ref 70–99)
Potassium: 3.9 mmol/L (ref 3.5–5.1)
Sodium: 136 mmol/L (ref 135–145)
Total Bilirubin: 0.6 mg/dL (ref 0.3–1.2)
Total Protein: 6.5 g/dL (ref 6.5–8.1)

## 2019-02-25 LAB — CBC
HCT: 31.5 % — ABNORMAL LOW (ref 36.0–46.0)
Hemoglobin: 10.5 g/dL — ABNORMAL LOW (ref 12.0–15.0)
MCH: 29.7 pg (ref 26.0–34.0)
MCHC: 33.3 g/dL (ref 30.0–36.0)
MCV: 89 fL (ref 80.0–100.0)
Platelets: 175 10*3/uL (ref 150–400)
RBC: 3.54 MIL/uL — ABNORMAL LOW (ref 3.87–5.11)
RDW: 15.2 % (ref 11.5–15.5)
WBC: 8 10*3/uL (ref 4.0–10.5)
nRBC: 0 % (ref 0.0–0.2)

## 2019-02-25 LAB — TROPONIN I (HIGH SENSITIVITY)
Troponin I (High Sensitivity): 18 ng/L — ABNORMAL HIGH (ref <18)
Troponin I (High Sensitivity): 19 ng/L — ABNORMAL HIGH (ref <18)

## 2019-02-25 NOTE — ED Notes (Signed)
Was able to get hold of friend she will come pick pt up shortly

## 2019-02-25 NOTE — ED Notes (Signed)
Pt given a cup of water 

## 2019-02-25 NOTE — ED Notes (Signed)
Pt wanting RN to call [place] friend to take her home, she does not want to wait on room.  Attempted to call, no answer.

## 2019-02-25 NOTE — ED Triage Notes (Signed)
Pt arrived to ED via EMS for feeling bad and confused something this afternoon. Pt doesn't know when. Pt is A&O at this time, answering all questions appropriately. Speaking in complete sentences. No pain. Speech clear. No blurred vision. No numbness. Pt is wheelchair bound.

## 2019-02-25 NOTE — ED Notes (Signed)
Friend here attempting to convince pt to stay

## 2019-02-25 NOTE — ED Notes (Signed)
Pt sitting in lobby with no distress noted; pt upset over wait time; pt updated on such; pt reports today she had episode where she because light-headed and confused; pt denies any c/o at present; pt's chart reviewed and protocols added

## 2019-02-26 ENCOUNTER — Emergency Department: Payer: Medicaid Other

## 2019-02-26 ENCOUNTER — Emergency Department
Admission: EM | Admit: 2019-02-26 | Discharge: 2019-02-26 | Discharge status: Against Medical Advice | Payer: Medicaid Other | Attending: Emergency Medicine | Admitting: Emergency Medicine

## 2019-02-26 DIAGNOSIS — R4182 Altered mental status, unspecified: Secondary | ICD-10-CM

## 2019-02-26 LAB — GLUCOSE, CAPILLARY: Glucose-Capillary: 199 mg/dL — ABNORMAL HIGH (ref 70–99)

## 2019-02-26 NOTE — ED Notes (Signed)
Pt asking to sign out AMA. Explained to pt her condition. And she states she understands and she is tired and wants to go home and knows that if she worsens she cannot hold the hospital liable.

## 2019-02-26 NOTE — ED Provider Notes (Signed)
[facility] Emergency Department Provider Note  ____________________________________________   First MD Initiated Contact with Patient 02/26/19 0015     (approximate)  I have reviewed the triage vital signs and the nursing notes.  History obtained from patient's neighbor   HISTORY  Chief Complaint Altered Mental Status    HPI Deanna Rice is a 64 y.o. female  With below list of chronic medical conditions presents with altered mental status per report. Patient states "I  Don't know what the hell happened just call Collette to come and get me". I called Collette the patients neighbor who states that yesterday afternoon after the patient's caregiver left at 2:30 she checked on the patient. SHe states that the patient was very confused.        Past Medical History:  Diagnosis Date  . Anemia, unspecified   . CHF (congestive heart failure) ([place])   . Chronic ischemic heart disease, unspecified   . COPD (chronic obstructive pulmonary disease) ([place])   . Dependence on supplemental oxygen   . Diabetes mellitus without complication ([place])   . Gout, unspecified   . Hypertension   . Mixed hyperlipidemia   . Obstructive chronic bronchitis without exacerbation ([place])   . Obstructive sleep apnea syndrome   . Peripheral venous insufficiency   . Primary generalized hypertrophic osteoarthrosis   . Primary hypothyroidism   . Severe recurrent major depression with psychotic features [facility])     Patient Active Problem List   Diagnosis Date Noted  . Cellulitis of left lower extremity 12/31/2018  . Diabetic ulcer of left lower leg associated with type 2 diabetes mellitus ([place]) 12/31/2018  . Uncontrolled type 2 diabetes mellitus with hyperglycemia ([place]) 12/31/2018  . Chronic pain syndrome 08/03/2017  . Oxygen dependent 08/03/2017  . Thrombocytopenia ([place]) 09/28/2015  . Anemia in chronic kidney disease 09/28/2015  . Diarrhea 11/28/2014  . AKI (acute kidney injury)  ([place]) 11/28/2014  . COPD (chronic obstructive pulmonary disease) ([place]) 11/28/2014  . Uncontrolled type 2 diabetes mellitus with hypoglycemia ([place]) 11/28/2014  . HTN (hypertension) 11/28/2014    Past Surgical History:  Procedure Laterality Date  . TONSILLECTOMY      Prior to Admission medications   Medication Sig Start Date End Date Taking? Authorizing Provider  ACCU-CHEK SOFTCLIX LANCETS lancets Use as instructed 04/28/17   Ronnell Freshwater, NP  ADVAIR DISKUS 250-50 MCG/DOSE AEPB INHALE 1 PUFF BY MOUTH INTO THE LUNGS 2 TIMES A DAY 11/25/18   Kendell Bane, NP  allopurinol (ZYLOPRIM) 100 MG tablet TAKE 1 TABLET BY MOUTH DAILY 11/25/18   Kendell Bane, NP  amitriptyline (ELAVIL) 25 MG tablet TAKE 2 TABLETS BY MOUTH AT BEDTIME 11/25/18   Kendell Bane, NP  aspirin EC 81 MG tablet Take 81 mg by mouth daily.    [provider]  cephALEXin (KEFLEX) 500 MG capsule Take 1 capsule (500 mg total) by mouth 3 (three) times daily. 12/30/18   Boscia, Greer Ee, NP  COMBIVENT RESPIMAT 20-100 MCG/ACT AERS respimat INHALE 1 PUFF BY MOUTH INTO THE LUNGS EVERY 6 HOURS 11/25/18   Ronnell Freshwater, NP  diclofenac sodium (VOLTAREN) 1 % GEL Apply 2 g topically 3 (three) times daily. 03/26/17   Ronnell Freshwater, NP  docusate sodium (COLACE) 100 MG capsule Take 50 mg by mouth daily as needed. Reported on 06/28/2015    [provider]  DULoxetine (CYMBALTA) 60 MG capsule TAKE ONE CAPSULE BY MOUTH TWICE A DAY 11/25/18   Orson Gear  J, NP  fluticasone (FLONASE) 50 MCG/ACT nasal spray PLACE 2 SPRAYS INTO BOTH NOSTRILS DAILY 11/25/18   Johnna Acosta, NP  folic acid (FOLVITE) 1 MG tablet TAKE 1 TABLET BY MOUTH DAILY 11/25/18   Johnna Acosta, NP  glucose blood (ACCU-CHEK AVIVA PLUS) test strip Use as instructed 11/14/17   Johnna Acosta, NP  glucose blood test strip Use as instructed 10/22/18   Johnna Acosta, NP  insulin aspart (NOVOLOG FLEXPEN) 100 UNIT/ML FlexPen Use as directed with  sliding scale maximum 63 units no more 03/26/18   Carlean Jews, NP  Insulin Glargine (BASAGLAR KWIKPEN) 100 UNIT/ML SOPN INJECT 50 UNITS SUBCUTANEOUSLY EVERY DAYAND 18 TO 24 UNITS AT BEDTIME 11/26/18   Johnna Acosta, NP  Insulin Pen Needle (GLOBAL EASE INJECT PEN NEEDLES) 32G X 4 MM MISC Use  As directed 12/28/18   Carlean Jews, NP  isosorbide mononitrate (IMDUR) 30 MG 24 hr tablet TAKE 1 TABLET BY MOUTH DAILY 11/25/18   Johnna Acosta, NP  lidocaine (XYLOCAINE) 2 % solution Apply small amount to affected areas three to four times daily prn pain. 02/23/19   Carlean Jews, NP  liraglutide (VICTOZA) 18 MG/3ML SOPN INJECT 1.8 MG EVERY MORNING 11/25/18   Johnna Acosta, NP  loratadine (CLARITIN) 10 MG tablet TAKE 1 TABLET BY MOUTH DAILY 11/25/18   Johnna Acosta, NP  Melatonin 10 MG TABS Take 40 mg by mouth at bedtime.    [provider]  metoprolol succinate (TOPROL-XL) 25 MG 24 hr tablet TAKE 1 TABLET BY MOUTH DAILY 11/25/18   Johnna Acosta, NP  Multiple Vitamins-Minerals (MULTIVITAMIN WITH MINERALS) tablet Take 1 tablet by mouth daily.    [provider]  mupirocin ointment (BACTROBAN) 2 % Apply small amount to affected area BID 12/30/18   Boscia, Heather E, NP  nitroGLYCERIN (NITROLINGUAL) 0.4 MG/SPRAY spray PLACE 1 SPRAY UNDER THE TONGUE EVERY 5 MINUTES FOR 3 DOSES AS NEEDED FOR CHEST PAIN. 06/19/18   Carlean Jews, NP  nystatin [facility]) powder APPLY TOPICALLY 6 TIMES DAILY 12/28/18   Carlean Jews, NP  omeprazole (PRILOSEC) 40 MG capsule TAKE 1 CAPSULE BY MOUTH DAILY 11/25/18   Johnna Acosta, NP  OXYGEN Inhale into the lungs. Inhale 4 liters into lungs continously    [provider]  Phendimetrazine Tartrate 35 MG TABS Take 1 tablet po bid prior to meals. 04/21/18   Johnna Acosta, NP  silver sulfADIAZINE (SILVADENE) 1 % cream APPLY 1 APPLICATION TOPICALLY DAILY 02/15/19   Carlean Jews, NP  simvastatin (ZOCOR) 80 MG tablet TAKE 1 TABLET BY  MOUTH DAILY AT 6 PM 11/25/18   [place], Coralee [place], NP  sulfamethoxazole-trimethoprim (BACTRIM DS) 800-160 MG tablet Take 1 tablet by mouth 2 (two) times daily. 02/23/19   Carlean Jews, NP  traMADol (ULTRAM) 50 MG tablet TAKE 1 TABLET BY MOUTH EVERY 12 HOURS ASNEEDED FOR MODERATE PAIN 02/25/19   Johnna Acosta, NP    Allergies Patient has no known allergies.  Family History  Problem Relation Age of Onset  . Hypertension Mother   . Diabetes Mother   . CAD Mother   . CAD Father   . Diabetes Father   . Hypertension Father   . Hypertension Sister   . Diabetes Sister   . CAD Sister     Social History Social History   Tobacco Use  . Smoking status: Former Games developer  . Smokeless tobacco: Never Used  Substance Use Topics  . Alcohol use: No  . Drug use: No    Review of Systems Constitutional: No fever/chills Eyes: No visual changes. ENT: No sore throat. Cardiovascular: Denies chest pain. Respiratory: Denies shortness of breath. Gastrointestinal: No abdominal pain.  No nausea, no vomiting.  No diarrhea.  No constipation. Genitourinary: Negative for dysuria. Musculoskeletal: Negative for neck pain.  Negative for back pain. Integumentary: Negative for rash. Neurological: Negative for headaches, focal weakness or numbness.   ____________________________________________   PHYSICAL EXAM:  VITAL SIGNS: ED Triage Vitals  Enc Vitals Group     BP 02/25/19 1619 (!) 155/83     Pulse Rate 02/25/19 1618 87     Resp 02/25/19 1618 18     Temp 02/25/19 1618 99.3 F (37.4 C)     Temp Source 02/25/19 1618 Oral     SpO2 02/25/19 1618 99 %     Weight 02/25/19 1618 (!) 163.7 kg (361 lb)     Height 02/25/19 1618 1.702 m (5\' 7" )     Head Circumference --      Peak Flow --      Pain Score 02/25/19 1618 0     Pain Loc --      Pain Edu? --      Excl. in GC? --     Constitutional: Alert and oriented.  Eyes: Conjunctivae are normal.  Head: Atraumatic. Mouth/Throat: Patient is  wearing a mask. Neck: No stridor.  No meningeal signs.   Cardiovascular: Normal rate, regular rhythm. Good peripheral circulation. Grossly normal heart sounds. Respiratory: Normal respiratory effort.  No retractions. Gastrointestinal: Soft and nontender. No distention.  Musculoskeletal: No lower extremity tenderness nor edema. No gross deformities of extremities. Neurologic:  Normal speech and language. No gross focal neurologic deficits are appreciated.  Skin:  Skin is warm, dry and intact. Psychiatric: agitated secondary to long wait times.  Speech and behavior are normal.  ____________________________________________   LABS (all labs ordered are listed, but only abnormal results are displayed)  Labs Reviewed  COMPREHENSIVE METABOLIC PANEL - Abnormal; Notable for the following components:      Result Value   Glucose, Bld 261 (*)    BUN 34 (*)    Creatinine, Ser 1.64 (*)    Calcium 8.4 (*)    Albumin 3.2 (*)    GFR calc non Af Amer 33 (*)    GFR calc Af Amer 38 (*)    All other components within normal limits  CBC - Abnormal; Notable for the following components:   RBC 3.54 (*)    Hemoglobin 10.5 (*)    HCT 31.5 (*)    All other components within normal limits  GLUCOSE, CAPILLARY - Abnormal; Notable for the following components:   Glucose-Capillary 199 (*)    All other components within normal limits  TROPONIN I (HIGH SENSITIVITY) - Abnormal; Notable for the following components:   Troponin I (High Sensitivity) 18 (*)    All other components within normal limits  TROPONIN I (HIGH SENSITIVITY) - Abnormal; Notable for the following components:   Troponin I (High Sensitivity) 19 (*)    All other components within normal limits  URINALYSIS, COMPLETE (UACMP) WITH MICROSCOPIC   ____________________________________________  EKG   ____________________________________________  RADIOLOGY I, Morley N Eri Mcevers, personally viewed and evaluated these images (plain radiographs) as  part of my medical decision making, as well as reviewing the written report by the radiologist.  ED MD interpretation: No CT evidence of an acute intracranial abnormality  per radiologist on CT head interpretation.  Encephalomalacia and cortical volume loss in left frontal lobe.  Official radiology report(s): CT Head Wo Contrast  Result Date: 02/26/2019 CLINICAL DATA:  Lightheaded and confused EXAM: CT HEAD WITHOUT CONTRAST TECHNIQUE: Contiguous axial images were obtained from the base of the skull through the vertex without intravenous contrast. COMPARISON:  None. FINDINGS: Brain: No acute territorial infarction, hemorrhage, or intracranial mass. Encephalomalacia and cortical volume loss at the left frontal lobe. Normal ventricle size. Vascular: No hyperdense vessels. Vertebral and carotid vascular calcification Skull: Normal. Negative for fracture or focal lesion. Sinuses/Orbits: No acute finding. Other: None IMPRESSION: 1. No CT evidence for acute intracranial abnormality 2. Encephalomalacia and cortical volume loss at the left frontal lobe. Electronically Signed   By: Jasmine Pang M.D.   On: 02/26/2019 01:08    _______________________________________  Procedures   ____________________________________________   INITIAL IMPRESSION / MDM / ASSESSMENT AND PLAN / ED COURSE  As part of my medical decision making, I reviewed the following data within the electronic MEDICAL RECORD NUMBER 64 year old female presented with above-stated history and physical exam a differential diagnosis including but not limited to TIA CVA metabolic encephalopathy including urinary tract infection.  CT scan of the head was performed which revealed no acute intracranial abnormality.  Plan to obtain an MRI.  Awaiting urinalysis.  Patient is alert and oriented at this time without any focal neurological deficit.   After returning from a CODE BLUE I was notified by the nursing staff that the patient requested to leave AGAINST  MEDICAL ADVICE and left the emergency department before my return.  ____________________________________________  FINAL CLINICAL IMPRESSION(S) / ED DIAGNOSES  Final diagnoses:  Altered mental status, unspecified altered mental status type     MEDICATIONS GIVEN DURING THIS VISIT:  Medications - No data to display   ED Discharge Orders    None      *Please note:  Deanna Rice was evaluated in Emergency Department on 02/26/2019 for the symptoms described in the history of present illness. She was evaluated in the context of the global COVID-19 pandemic, which necessitated consideration that the patient might be at risk for infection with the SARS-CoV-2 virus that causes COVID-19. Institutional protocols and algorithms that pertain to the evaluation of patients at risk for COVID-19 are in a state of rapid change based on information released by regulatory bodies including the CDC and federal and state organizations. These policies and algorithms were followed during the patient's care in the ED.  Some ED evaluations and interventions may be delayed as a result of limited staffing during the pandemic.*  Note:  This document was prepared using Dragon voice recognition software and may include unintentional dictation errors.   Darci Current, MD 02/26/19 [id number]

## 2019-02-28 DIAGNOSIS — N764 Abscess of vulva: Secondary | ICD-10-CM | POA: Insufficient documentation

## 2019-03-01 ENCOUNTER — Other Ambulatory Visit: Payer: Self-pay

## 2019-03-01 MED ORDER — NYSTATIN 100000 UNIT/GM EX POWD: CUTANEOUS | 1 refills | Status: DC

## 2019-03-02 ENCOUNTER — Telehealth: Payer: Self-pay

## 2019-03-02 NOTE — Telephone Encounter (Signed)
Confirmed appointment with patient. klh °

## 2019-03-04 ENCOUNTER — Other Ambulatory Visit: Payer: Self-pay

## 2019-03-04 ENCOUNTER — Encounter: Payer: Self-pay | Admitting: Adult Health

## 2019-03-04 ENCOUNTER — Ambulatory Visit: Payer: Medicaid Other | Admitting: Adult Health

## 2019-03-04 ENCOUNTER — Telehealth: Payer: Self-pay

## 2019-03-04 VITALS — BP 147/85 | HR 84 | Temp 97.3°F | Resp 16 | Ht 67.0 in | Wt 361.0 lb

## 2019-03-04 DIAGNOSIS — R4182 Altered mental status, unspecified: Secondary | ICD-10-CM | POA: Diagnosis not present

## 2019-03-04 DIAGNOSIS — Z9989 Dependence on other enabling machines and devices: Secondary | ICD-10-CM

## 2019-03-04 DIAGNOSIS — R7989 Other specified abnormal findings of blood chemistry: Secondary | ICD-10-CM | POA: Diagnosis not present

## 2019-03-04 DIAGNOSIS — G4733 Obstructive sleep apnea (adult) (pediatric): Secondary | ICD-10-CM

## 2019-03-04 DIAGNOSIS — E11622 Type 2 diabetes mellitus with other skin ulcer: Secondary | ICD-10-CM

## 2019-03-04 DIAGNOSIS — Z91199 Patient's noncompliance with other medical treatment and regimen due to unspecified reason: Secondary | ICD-10-CM

## 2019-03-04 DIAGNOSIS — Z9119 Patient's noncompliance with other medical treatment and regimen: Secondary | ICD-10-CM

## 2019-03-04 DIAGNOSIS — E1165 Type 2 diabetes mellitus with hyperglycemia: Secondary | ICD-10-CM | POA: Diagnosis not present

## 2019-03-04 DIAGNOSIS — Z9981 Dependence on supplemental oxygen: Secondary | ICD-10-CM

## 2019-03-04 DIAGNOSIS — I10 Essential (primary) hypertension: Secondary | ICD-10-CM

## 2019-03-04 DIAGNOSIS — L97929 Non-pressure chronic ulcer of unspecified part of left lower leg with unspecified severity: Secondary | ICD-10-CM

## 2019-03-04 DIAGNOSIS — Z6841 Body Mass Index (BMI) 40.0 and over, adult: Secondary | ICD-10-CM

## 2019-03-04 LAB — POCT GLYCOSYLATED HEMOGLOBIN (HGB A1C): Hemoglobin A1C: 8 % — AB (ref 4.0–5.6)

## 2019-03-04 MED ORDER — CLOPIDOGREL BISULFATE 75 MG PO TABS: 75.0000 mg | ORAL_TABLET | Freq: Every day | ORAL | 2 refills | Status: DC

## 2019-03-04 NOTE — Telephone Encounter (Signed)
Spoke with pt that we add plavix and also we add few test for echo and carotoid beth will call her back and ignore for u/s and also if she spell  Words wrong for chest pain or any pain in arm she need to go to ED or call 911

## 2019-03-04 NOTE — Progress Notes (Signed)
[facility] [place], [place] [address]  Internal MEDICINE  Office Visit Note  Patient Name: Deanna Rice  [id number]  [id number]  Date of Service: 03/04/2019  Chief Complaint  Patient presents with  . Diabetes  . Hyperlipidemia  . Hypertension    HPI  Pt is here for follow up on DM, HTn and HLD.  She reports on December 02/25/2019 she became confused, with garbled speech. She was having a "funny feeling" in her face and hands. She also described tingling in her face. Her neighbor and a caregiver called 911.  She was taken to the hospital and evaluated for Stroke.  Ct scan was negative for acute abnormality, however the ED doctor did discuss MRI with her.  Apparently, per the hospital notes she signed out AMA.  The patient does not remember signing anything, and does not feel like she left AMA.  She does report no paperwork was given to her when she left.  Since leaving the  Hospital she had had two more episodes of head tingling, and face tingling. She reports she took Sublingual NTG, which made it go away within an hour.  She also had a "pounding headache" one day this week.  She Denies Chest pain,  palpitations, or blurred vision.     Current Medication: Outpatient Encounter Medications as of 03/04/2019  Medication Sig  . ACCU-CHEK SOFTCLIX LANCETS lancets Use as instructed  . ADVAIR DISKUS 250-50 MCG/DOSE AEPB INHALE 1 PUFF BY MOUTH INTO THE LUNGS 2 TIMES A DAY  . allopurinol (ZYLOPRIM) 100 MG tablet TAKE 1 TABLET BY MOUTH DAILY  . amitriptyline (ELAVIL) 25 MG tablet TAKE 2 TABLETS BY MOUTH AT BEDTIME  . aspirin EC 81 MG tablet Take 81 mg by mouth daily.  . cephALEXin (KEFLEX) 500 MG capsule Take 1 capsule (500 mg total) by mouth 3 (three) times daily.  . COMBIVENT RESPIMAT 20-100 MCG/ACT AERS respimat INHALE 1 PUFF BY MOUTH INTO THE LUNGS EVERY 6 HOURS  . diclofenac sodium (VOLTAREN) 1 % GEL Apply 2 g topically 3 (three) times daily.  Marland Kitchen docusate sodium  (COLACE) 100 MG capsule Take 50 mg by mouth daily as needed. Reported on 06/28/2015  . DULoxetine (CYMBALTA) 60 MG capsule TAKE ONE CAPSULE BY MOUTH TWICE A DAY  . fluticasone (FLONASE) 50 MCG/ACT nasal spray PLACE 2 SPRAYS INTO BOTH NOSTRILS DAILY  . folic acid (FOLVITE) 1 MG tablet TAKE 1 TABLET BY MOUTH DAILY  . glucose blood (ACCU-CHEK AVIVA PLUS) test strip Use as instructed  . glucose blood test strip Use as instructed  . insulin aspart (NOVOLOG FLEXPEN) 100 UNIT/ML FlexPen Use as directed with sliding scale maximum 63 units no more  . Insulin Glargine (BASAGLAR KWIKPEN) 100 UNIT/ML SOPN INJECT 50 UNITS SUBCUTANEOUSLY EVERY DAYAND 18 TO 24 UNITS AT BEDTIME  . Insulin Pen Needle (GLOBAL EASE INJECT PEN NEEDLES) 32G X 4 MM MISC Use  As directed  . isosorbide mononitrate (IMDUR) 30 MG 24 hr tablet TAKE 1 TABLET BY MOUTH DAILY  . lidocaine (XYLOCAINE) 2 % solution Apply small amount to affected areas three to four times daily prn pain.  Marland Kitchen liraglutide (VICTOZA) 18 MG/3ML SOPN INJECT 1.8 MG EVERY MORNING  . loratadine (CLARITIN) 10 MG tablet TAKE 1 TABLET BY MOUTH DAILY  . Melatonin 10 MG TABS Take 40 mg by mouth at bedtime.  . metoprolol succinate (TOPROL-XL) 25 MG 24 hr tablet TAKE 1 TABLET BY MOUTH DAILY  . Multiple Vitamins-Minerals (MULTIVITAMIN WITH MINERALS)  tablet Take 1 tablet by mouth daily.  . mupirocin ointment (BACTROBAN) 2 % Apply small amount to affected area BID  . nitroGLYCERIN (NITROLINGUAL) 0.4 MG/SPRAY spray PLACE 1 SPRAY UNDER THE TONGUE EVERY 5 MINUTES FOR 3 DOSES AS NEEDED FOR CHEST PAIN.  Marland Kitchen nystatin (NYAMYC) powder APPLY TOPICALLY 6 TIMES DAILY  . omeprazole (PRILOSEC) 40 MG capsule TAKE 1 CAPSULE BY MOUTH DAILY  . OXYGEN Inhale into the lungs. Inhale 4 liters into lungs continously  . Phendimetrazine Tartrate 35 MG TABS Take 1 tablet po bid prior to meals.  . silver sulfADIAZINE (SILVADENE) 1 % cream APPLY 1 APPLICATION TOPICALLY DAILY  . simvastatin (ZOCOR) 80 MG tablet  TAKE 1 TABLET BY MOUTH DAILY AT 6 PM  . sulfamethoxazole-trimethoprim (BACTRIM DS) 800-160 MG tablet Take 1 tablet by mouth 2 (two) times daily.  . traMADol (ULTRAM) 50 MG tablet TAKE 1 TABLET BY MOUTH EVERY 12 HOURS ASNEEDED FOR MODERATE PAIN  . clopidogrel (PLAVIX) 75 MG tablet Take 1 tablet (75 mg total) by mouth daily.   No facility-administered encounter medications on file as of 03/04/2019.    Surgical History: Past Surgical History:  Procedure Laterality Date  . TONSILLECTOMY      Medical History: Past Medical History:  Diagnosis Date  . Anemia, unspecified   . CHF (congestive heart failure) (HCC)   . Chronic ischemic heart disease, unspecified   . COPD (chronic obstructive pulmonary disease) (HCC)   . Dependence on supplemental oxygen   . Diabetes mellitus without complication (HCC)   . Gout, unspecified   . Hypertension   . Mixed hyperlipidemia   . 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 Obstructive sleep apnea syndrome   . Peripheral venous insufficiency   . Primary generalized hypertrophic osteoarthrosis   . Primary hypothyroidism   . Severe recurrent major depression with psychotic features (HCC)     Family History: Family History  Problem Relation Age of Onset  . Hypertension Mother   . Diabetes Mother   . CAD Mother   . CAD Father   . Diabetes Father   . Hypertension Father   . Hypertension Sister   . Diabetes Sister   . CAD Sister     Social History   Socioeconomic History  . Marital status: Single    Spouse name: Not on file  . Number of children: Not on file  . Years of education: Not on file  . Highest education level: Not on file  Occupational History  . Not on file  Tobacco Use  . Smoking status: Former Games developer  . Smokeless tobacco: Never Used  Substance and Sexual Activity  . Alcohol use: No  . Drug use: No  . Sexual activity: Not on file  Other Topics Concern  . Not on file  Social History Narrative  . Not on  file   Social Determinants of Health   Financial Resource Strain:   . Difficulty of Paying Living Expenses: Not on file  Food Insecurity:   . Worried About Programme researcher, broadcasting/film/video in the Last Year: Not on file  . Ran Out of Food in the Last Year: Not on file  Transportation Needs:   . Lack of Transportation (Medical): Not on file  . Lack of Transportation (Non-Medical): Not on file  Physical Activity:   . Days of Exercise per Week: Not on file  . Minutes of Exercise per Session: Not on file  Stress:   . Feeling of Stress : Not on  file  Social Connections:   . Frequency of Communication with Friends and Family: Not on file  . Frequency of Social Gatherings with Friends and Family: Not on file  . Attends Religious Services: Not on file  . Active Member of Clubs or Organizations: Not on file  . Attends Banker Meetings: Not on file  . Marital Status: Not on file  Intimate Partner Violence:   . Fear of Current or Ex-Partner: Not on file  . Emotionally Abused: Not on file  . Physically Abused: Not on file  . Sexually Abused: Not on file      Review of Systems  Constitutional: Negative for chills, fatigue and unexpected weight change.  HENT: Negative for congestion, rhinorrhea, sneezing and sore throat.   Eyes: Negative for photophobia, pain and redness.  Respiratory: Negative for cough, chest tightness and shortness of breath.   Cardiovascular: Negative for chest pain and palpitations.  Gastrointestinal: Negative for abdominal pain, constipation, diarrhea, nausea and vomiting.  Endocrine: Negative.   Genitourinary: Negative for dysuria and frequency.  Musculoskeletal: Negative for arthralgias, back pain, joint swelling and neck pain.  Skin: Negative for rash.  Allergic/Immunologic: Negative.   Neurological: Negative for tremors and numbness.  Hematological: Negative for adenopathy. Does not bruise/bleed easily.  Psychiatric/Behavioral: Negative for behavioral  problems and sleep disturbance. The patient is not nervous/anxious.     Vital Signs: BP (!) 147/85   Pulse 84   Temp (!) 97.3 F (36.3 C)   Resp 16   Ht 5\' 7"  (1.702 m)   Wt (!) 361 lb (163.7 kg) Comment: at home  SpO2 95%   BMI 56.54 kg/m    Physical Exam Vitals and nursing note reviewed.  Constitutional:      General: She is not in acute distress.    Appearance: She is well-developed. She is not diaphoretic.  HENT:     Head: Normocephalic and atraumatic.     Mouth/Throat:     Pharynx: No oropharyngeal exudate.  Eyes:     Pupils: Pupils are equal, round, and reactive to light.  Neck:     Thyroid: No thyromegaly.     Vascular: No JVD.     Trachea: No tracheal deviation.  Cardiovascular:     Rate and Rhythm: Normal rate and regular rhythm.     Heart sounds: Normal heart sounds. No murmur. No friction rub. No gallop.   Pulmonary:     Effort: Pulmonary effort is normal. No respiratory distress.     Breath sounds: Normal breath sounds. No wheezing or rales.  Chest:     Chest wall: No tenderness.  Abdominal:     Palpations: Abdomen is soft.     Tenderness: There is no abdominal tenderness. There is no guarding.  Musculoskeletal:        General: Normal range of motion.     Cervical back: Normal range of motion and neck supple.  Lymphadenopathy:     Cervical: No cervical adenopathy.  Skin:    General: Skin is warm and dry.  Neurological:     Mental Status: She is alert and oriented to person, place, and time.     Cranial Nerves: No cranial nerve deficit.  Psychiatric:        Behavior: Behavior normal.        Thought Content: Thought content normal.        Judgment: Judgment normal.    Assessment/Plan: 1. Uncontrolled type 2 diabetes mellitus with hyperglycemia (HCC) A1C 8.0 today, once  again discussed dietary modification and the importance of medication compliance.  - POCT HgB A1C  2. Altered mental status, unspecified altered mental status type Will get  carotid US and echo to evaluate. - US Carotid Duplex Bilateral; Future - ECHOCARDIOGRAM COMPLETE; Future  3. Non-compliance Pt left hospital AMA per notes.   4. Elevated serum creatinine Pt was likely dehydrated at time of blood draw, encouraged fluid replacement, will redraw labs at next visit.   5. Essential hypertension Stable, continue present therapy.   6. Diabetic ulcer of left lower leg associated with type 2 diabetes mellitus (HCC) Stable, wound care nurse continues to follow.  7. OSA on CPAP Continue to use cpap as prescribed.   8. Oxygen dependent Continue to use oxygen as prescribed.   9. Class 3 severe obesity due to excess calories with serious comorbidity and body mass index (BMI) of 50.0 to 59.9 in adult [facility]) Obesity Counseling: Risk Assessment: An assessment of behavioral risk factors was made today and includes lack of exercise sedentary lifestyle, lack of portion control and poor dietary habits.  Risk Modification Advice: She was counseled on portion control guidelines. Restricting daily caloric intake to 1800. The detrimental long term effects of obesity on her health and ongoing poor compliance was also discussed with the patient.  General Counseling: kandice schmelter understanding of the findings of todays visit and agrees with plan of treatment. I have discussed any further diagnostic evaluation that may be needed or ordered today. We also reviewed her medications today. she has been encouraged to call the office with any questions or concerns that should arise related to todays visit.    Orders Placed This Encounter  Procedures  . US Renal  . US Carotid Duplex Bilateral  . POCT HgB A1C  . ECHOCARDIOGRAM COMPLETE  . VAS US RENAL ARTERY DUPLEX    Meds ordered this encounter  Medications  . clopidogrel (PLAVIX) 75 MG tablet    Sig: Take 1 tablet (75 mg total) by mouth daily.    Dispense:  30 tablet    Refill:  2    Time spent: 30  Minutes   This patient was seen by Blima Ledger AGNP-C in Collaboration with Dr Lyndon Code as a part of collaborative care agreement     Johnna Acosta AGNP-C Internal medicine

## 2019-03-12 ENCOUNTER — Telehealth: Payer: Self-pay

## 2019-03-12 NOTE — Telephone Encounter (Signed)
PLAN OF CARE SIGNED AND PLACED IN [facility] FOLDER. 

## 2019-03-15 ENCOUNTER — Other Ambulatory Visit: Payer: Self-pay

## 2019-03-15 DIAGNOSIS — N764 Abscess of vulva: Secondary | ICD-10-CM

## 2019-03-15 MED ORDER — LIDOCAINE VISCOUS HCL 2 % MT SOLN: OROMUCOSAL | 1 refills | Status: DC

## 2019-03-19 ENCOUNTER — Other Ambulatory Visit: Payer: Medicaid Other

## 2019-03-23 NOTE — ED Provider Notes (Signed)
ED ECG REPORT I, Alcorn N Renny Gunnarson, the attending physician, personally viewed and interpreted this ECG.   Date: 02/25/2019  EKG Time: 9:02 PM  Rate: 85  Rhythm: Sinus rhythm  Axis: Normal  Intervals: Normal  ST&T Change: None    Darci Current, MD 03/23/19 0301

## 2019-03-26 ENCOUNTER — Other Ambulatory Visit: Payer: Self-pay | Admitting: Nurse Practitioner

## 2019-03-26 ENCOUNTER — Telehealth: Payer: Self-pay

## 2019-03-26 DIAGNOSIS — N764 Abscess of vulva: Secondary | ICD-10-CM

## 2019-03-26 MED ORDER — LIDOCAINE VISCOUS HCL 2 % MT SOLN: OROMUCOSAL | 1 refills | Status: DC

## 2019-03-26 MED ORDER — DOXYCYCLINE HYCLATE 100 MG PO TABS: 100.0000 mg | ORAL_TABLET | Freq: Two times a day (BID) | ORAL | 0 refills | Status: DC

## 2019-03-26 NOTE — Telephone Encounter (Signed)
Patient has been advised. Deanna Rice 

## 2019-03-26 NOTE — Progress Notes (Signed)
Sent prescription for doxycycline 100mg twice daily. She should take this with food in case It upsets her stomach. She should take this for next 30 days. I also renewed the lidocaine to apply to affected areas as needed for pain. If this is still issue after this treatment, she should be seen for reassessment.

## 2019-03-26 NOTE — Telephone Encounter (Signed)
Sent prescription for doxycycline 100mg  twice daily. She should take this with food in case It upsets her stomach. She should take this for next 30 days. I also renewed the lidocaine to apply to affected areas as needed for pain. If this is still issue after this treatment, she should be seen for reassessment.

## 2019-03-29 ENCOUNTER — Ambulatory Visit: Payer: Medicaid Other

## 2019-03-30 ENCOUNTER — Telehealth: Payer: Self-pay

## 2019-03-30 ENCOUNTER — Other Ambulatory Visit: Payer: Self-pay | Admitting: Adult Health

## 2019-03-30 DIAGNOSIS — G894 Chronic pain syndrome: Secondary | ICD-10-CM

## 2019-03-30 MED ORDER — NOVOLOG FLEXPEN 100 UNIT/ML ~~LOC~~ SOPN: PEN_INJECTOR | SUBCUTANEOUS | 3 refills | Status: AC

## 2019-03-30 NOTE — Telephone Encounter (Signed)
PRIOR AUTHORIZATION FOR VICTOZA HAS BEEN APPROVED...VALID FROM 02/26/2019-03/24/2020.TAT

## 2019-03-30 NOTE — Telephone Encounter (Signed)
LAST 03/04/19

## 2019-04-02 ENCOUNTER — Ambulatory Visit: Payer: Medicaid Other

## 2019-04-08 ENCOUNTER — Ambulatory Visit: Payer: Medicaid Other

## 2019-04-12 ENCOUNTER — Other Ambulatory Visit: Payer: Self-pay

## 2019-04-12 ENCOUNTER — Telehealth: Payer: Self-pay

## 2019-04-12 DIAGNOSIS — N764 Abscess of vulva: Secondary | ICD-10-CM

## 2019-04-12 MED ORDER — LIDOCAINE VISCOUS HCL 2 % MT SOLN: OROMUCOSAL | 0 refills | Status: DC

## 2019-04-13 NOTE — Telephone Encounter (Signed)
Send message to beth for referral

## 2019-04-16 ENCOUNTER — Other Ambulatory Visit: Payer: Self-pay

## 2019-04-16 DIAGNOSIS — N764 Abscess of vulva: Secondary | ICD-10-CM

## 2019-04-16 MED ORDER — LIDOCAINE VISCOUS HCL 2 % MT SOLN: OROMUCOSAL | 1 refills | Status: DC

## 2019-04-16 MED ORDER — LIDOCAINE VISCOUS HCL 2 % MT SOLN: OROMUCOSAL | 0 refills | Status: DC

## 2019-04-16 NOTE — Telephone Encounter (Signed)
Pt called that she had appt with GYN in march she need refills for lidocaine 2% soln as per heather send 1 time with no refills

## 2019-04-21 ENCOUNTER — Telehealth: Payer: Self-pay

## 2019-04-21 NOTE — Telephone Encounter (Signed)
Confirmed appointment on 04/26/2019 and screened for covid. klh 

## 2019-04-26 ENCOUNTER — Other Ambulatory Visit: Payer: Self-pay | Admitting: Adult Health

## 2019-04-26 ENCOUNTER — Ambulatory Visit: Payer: Medicaid Other | Admitting: Internal Medicine

## 2019-04-26 ENCOUNTER — Telehealth: Payer: Self-pay

## 2019-04-26 DIAGNOSIS — G894 Chronic pain syndrome: Secondary | ICD-10-CM

## 2019-04-26 NOTE — Telephone Encounter (Signed)
Pt needs a refill.

## 2019-04-26 NOTE — Telephone Encounter (Signed)
Called in phar for contour next meter,teststrips and leader lancets 33 gauge with 3 refills

## 2019-04-29 ENCOUNTER — Telehealth: Payer: Self-pay

## 2019-04-29 NOTE — Telephone Encounter (Signed)
PLAN OF CARE SIGNED AND PLACED IN [facility] FOLDER. 

## 2019-05-12 ENCOUNTER — Other Ambulatory Visit: Payer: Self-pay | Admitting: Adult Health

## 2019-05-12 DIAGNOSIS — D696 Thrombocytopenia, unspecified: Secondary | ICD-10-CM

## 2019-05-25 ENCOUNTER — Encounter: Payer: Self-pay | Admitting: Certified Nurse Midwife

## 2019-05-26 ENCOUNTER — Telehealth: Payer: Self-pay

## 2019-05-26 NOTE — Telephone Encounter (Signed)
Confirmed appointment on 05/31/2019 and screened for covid. klh 

## 2019-05-27 ENCOUNTER — Other Ambulatory Visit: Payer: Self-pay | Admitting: Adult Health

## 2019-05-27 DIAGNOSIS — D696 Thrombocytopenia, unspecified: Secondary | ICD-10-CM

## 2019-05-27 DIAGNOSIS — G894 Chronic pain syndrome: Secondary | ICD-10-CM

## 2019-05-27 DIAGNOSIS — D649 Anemia, unspecified: Secondary | ICD-10-CM

## 2019-05-27 NOTE — Telephone Encounter (Signed)
1/21 and 06/02/19

## 2019-05-31 ENCOUNTER — Ambulatory Visit: Payer: Medicaid Other | Admitting: Internal Medicine

## 2019-06-02 ENCOUNTER — Ambulatory Visit: Payer: Medicaid Other | Admitting: Adult Health

## 2019-06-09 ENCOUNTER — Telehealth: Payer: Self-pay

## 2019-06-09 NOTE — Telephone Encounter (Signed)
Plan of care and physician order signed and placed in [facility] folder.

## 2019-06-16 ENCOUNTER — Telehealth: Payer: Self-pay

## 2019-06-16 NOTE — Telephone Encounter (Signed)
Nurse for [facility] called to verbal ok to continue pill pack and I gave verbal ok to do so.

## 2019-06-17 ENCOUNTER — Telehealth: Payer: Self-pay

## 2019-06-17 NOTE — Telephone Encounter (Signed)
Patient cancelled appointment on 06/21/2019 will call back to reschedule. kl[facility]

## 2019-06-21 ENCOUNTER — Ambulatory Visit: Payer: Medicaid Other | Admitting: Internal Medicine

## 2019-06-23 ENCOUNTER — Other Ambulatory Visit: Payer: Self-pay | Admitting: Adult Health

## 2019-06-23 DIAGNOSIS — G894 Chronic pain syndrome: Secondary | ICD-10-CM

## 2019-06-23 DIAGNOSIS — D649 Anemia, unspecified: Secondary | ICD-10-CM

## 2019-06-23 NOTE — Telephone Encounter (Signed)
Pt needs refill. Last appt 03/04/19, next appt 07/06/19

## 2019-06-30 ENCOUNTER — Encounter: Payer: Medicare Other | Admitting: Certified Nurse Midwife

## 2019-07-02 ENCOUNTER — Telehealth: Payer: Self-pay

## 2019-07-02 NOTE — Telephone Encounter (Signed)
Patient rescheduled appointment to 05/11/20021 to 07/14/2019. k[facility]

## 2019-07-02 NOTE — Telephone Encounter (Signed)
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 Pt states that the pain just started this morning and hurts upon movement and no movement. Pt states she took a tramadol half an hour ago and has been icing and putting heat on the arm. Advised pt to take a break and continue icing or putting heat on arm. Advised pt that if she does not feel better to give [place] a call.

## 2019-07-02 NOTE — Telephone Encounter (Signed)
Called pt to follow up on right arm pain and pt states that the pain has gotten better and that she does believe it was from doing the diamond art for a long period of time. I advised pt if anything changes to either give [place] a call or go to the ER if the pain doesn't subside or gets worse.

## 2019-07-03 ENCOUNTER — Emergency Department: Payer: Medicare Other

## 2019-07-03 ENCOUNTER — Other Ambulatory Visit: Payer: Self-pay

## 2019-07-03 ENCOUNTER — Observation Stay
Admission: EM | Admit: 2019-07-03 | Discharge: 2019-07-04 | Disposition: A | Discharge status: Home / Self Care | Payer: Medicare Other | Attending: Internal Medicine | Admitting: Internal Medicine

## 2019-07-03 DIAGNOSIS — Z20822 Contact with and (suspected) exposure to covid-19: Secondary | ICD-10-CM | POA: Diagnosis not present

## 2019-07-03 DIAGNOSIS — R079 Chest pain, unspecified: Principal | ICD-10-CM | POA: Diagnosis present

## 2019-07-03 DIAGNOSIS — Z9981 Dependence on supplemental oxygen: Secondary | ICD-10-CM | POA: Insufficient documentation

## 2019-07-03 DIAGNOSIS — Z7982 Long term (current) use of aspirin: Secondary | ICD-10-CM | POA: Insufficient documentation

## 2019-07-03 DIAGNOSIS — N179 Acute kidney failure, unspecified: Secondary | ICD-10-CM | POA: Insufficient documentation

## 2019-07-03 DIAGNOSIS — E669 Obesity, unspecified: Secondary | ICD-10-CM | POA: Diagnosis not present

## 2019-07-03 DIAGNOSIS — Z794 Long term (current) use of insulin: Secondary | ICD-10-CM | POA: Diagnosis not present

## 2019-07-03 DIAGNOSIS — E782 Mixed hyperlipidemia: Secondary | ICD-10-CM | POA: Diagnosis not present

## 2019-07-03 DIAGNOSIS — Z791 Long term (current) use of non-steroidal anti-inflammatories (NSAID): Secondary | ICD-10-CM | POA: Insufficient documentation

## 2019-07-03 DIAGNOSIS — Z7902 Long term (current) use of antithrombotics/antiplatelets: Secondary | ICD-10-CM | POA: Diagnosis not present

## 2019-07-03 DIAGNOSIS — I509 Heart failure, unspecified: Secondary | ICD-10-CM | POA: Diagnosis not present

## 2019-07-03 DIAGNOSIS — I259 Chronic ischemic heart disease, unspecified: Secondary | ICD-10-CM | POA: Diagnosis not present

## 2019-07-03 DIAGNOSIS — E785 Hyperlipidemia, unspecified: Secondary | ICD-10-CM | POA: Diagnosis not present

## 2019-07-03 DIAGNOSIS — I110 Hypertensive heart disease with heart failure: Secondary | ICD-10-CM | POA: Diagnosis not present

## 2019-07-03 DIAGNOSIS — M109 Gout, unspecified: Secondary | ICD-10-CM | POA: Insufficient documentation

## 2019-07-03 DIAGNOSIS — E11622 Type 2 diabetes mellitus with other skin ulcer: Secondary | ICD-10-CM | POA: Diagnosis not present

## 2019-07-03 DIAGNOSIS — G4733 Obstructive sleep apnea (adult) (pediatric): Secondary | ICD-10-CM | POA: Insufficient documentation

## 2019-07-03 DIAGNOSIS — I2510 Atherosclerotic heart disease of native coronary artery without angina pectoris: Secondary | ICD-10-CM | POA: Insufficient documentation

## 2019-07-03 DIAGNOSIS — I4891 Unspecified atrial fibrillation: Secondary | ICD-10-CM | POA: Insufficient documentation

## 2019-07-03 DIAGNOSIS — Z87891 Personal history of nicotine dependence: Secondary | ICD-10-CM | POA: Insufficient documentation

## 2019-07-03 DIAGNOSIS — E039 Hypothyroidism, unspecified: Secondary | ICD-10-CM | POA: Diagnosis not present

## 2019-07-03 DIAGNOSIS — J449 Chronic obstructive pulmonary disease, unspecified: Secondary | ICD-10-CM | POA: Diagnosis not present

## 2019-07-03 DIAGNOSIS — Z7951 Long term (current) use of inhaled steroids: Secondary | ICD-10-CM | POA: Diagnosis not present

## 2019-07-03 DIAGNOSIS — Z79899 Other long term (current) drug therapy: Secondary | ICD-10-CM | POA: Diagnosis not present

## 2019-07-03 DIAGNOSIS — Z6841 Body Mass Index (BMI) 40.0 and over, adult: Secondary | ICD-10-CM | POA: Insufficient documentation

## 2019-07-03 LAB — CBC WITH DIFFERENTIAL/PLATELET
Abs Immature Granulocytes: 0.03 10*3/uL (ref 0.00–0.07)
Basophils Absolute: 0 10*3/uL (ref 0.0–0.1)
Basophils Relative: 0 %
Eosinophils Absolute: 0.1 10*3/uL (ref 0.0–0.5)
Eosinophils Relative: 1 %
HCT: 26.1 % — ABNORMAL LOW (ref 36.0–46.0)
Hemoglobin: 9 g/dL — ABNORMAL LOW (ref 12.0–15.0)
Immature Granulocytes: 0 %
Lymphocytes Relative: 10 %
Lymphs Abs: 0.8 10*3/uL (ref 0.7–4.0)
MCH: 30.8 pg (ref 26.0–34.0)
MCHC: 34.5 g/dL (ref 30.0–36.0)
MCV: 89.4 fL (ref 80.0–100.0)
Monocytes Absolute: 0.5 10*3/uL (ref 0.1–1.0)
Monocytes Relative: 6 %
Neutro Abs: 6.5 10*3/uL (ref 1.7–7.7)
Neutrophils Relative %: 83 %
Platelets: 132 10*3/uL — ABNORMAL LOW (ref 150–400)
RBC: 2.92 MIL/uL — ABNORMAL LOW (ref 3.87–5.11)
RDW: 15.9 % — ABNORMAL HIGH (ref 11.5–15.5)
WBC: 7.9 10*3/uL (ref 4.0–10.5)
nRBC: 0 % (ref 0.0–0.2)

## 2019-07-03 LAB — COMPREHENSIVE METABOLIC PANEL
ALT: 15 U/L (ref 0–44)
AST: 15 U/L (ref 15–41)
Albumin: 3 g/dL — ABNORMAL LOW (ref 3.5–5.0)
Alkaline Phosphatase: 69 U/L (ref 38–126)
Anion gap: 7 (ref 5–15)
BUN: 32 mg/dL — ABNORMAL HIGH (ref 8–23)
CO2: 26 mmol/L (ref 22–32)
Calcium: 8.1 mg/dL — ABNORMAL LOW (ref 8.9–10.3)
Chloride: 104 mmol/L (ref 98–111)
Creatinine, Ser: 1.66 mg/dL — ABNORMAL HIGH (ref 0.44–1.00)
GFR calc Af Amer: 37 mL/min — ABNORMAL LOW (ref >60)
GFR calc non Af Amer: 32 mL/min — ABNORMAL LOW (ref >60)
Glucose, Bld: 172 mg/dL — ABNORMAL HIGH (ref 70–99)
Potassium: 3.8 mmol/L (ref 3.5–5.1)
Sodium: 137 mmol/L (ref 135–145)
Total Bilirubin: 0.4 mg/dL (ref 0.3–1.2)
Total Protein: 6.1 g/dL — ABNORMAL LOW (ref 6.5–8.1)

## 2019-07-03 LAB — TROPONIN I (HIGH SENSITIVITY): Troponin I (High Sensitivity): 14 ng/L (ref <18)

## 2019-07-03 MED ORDER — IPRATROPIUM-ALBUTEROL 0.5-2.5 (3) MG/3ML IN SOLN
3.0000 mL | Freq: Once | RESPIRATORY_TRACT | Status: AC
  Administered 2019-07-03: 23:00:00 3 mL via RESPIRATORY_TRACT
  Filled 2019-07-03: qty 3

## 2019-07-03 MED ORDER — NITROGLYCERIN 2 % TD OINT
0.5000 [in_us] | TOPICAL_OINTMENT | Freq: Once | TRANSDERMAL | Status: AC
  Administered 2019-07-04: 0.5 [in_us] via TOPICAL
  Filled 2019-07-03: qty 1

## 2019-07-03 NOTE — ED Provider Notes (Signed)
[facility] Emergency Department Provider Note    First MD Initiated Contact with Patient 07/03/19 2207     (approximate)  I have reviewed the triage vital signs and the nursing notes.   HISTORY  Chief Complaint Shortness of Breath    HPI Deanna Rice is a 64 y.o. female bolus past medical history presents to the ER for evaluation of chest pain shortness of breath that occurred prior to arrival.  EMS was called on patient tachypneic very short of breath and ill-appearing diaphoretic and clammy.  On arrival to ED are patient feels improved.  She does wear 4 L nasal cannula chronically.  Denies any recent fevers.  No nausea.  States the pain lasted 30 to 45 minutes.  Denies any radiation.    Past Medical History:  Diagnosis Date  . Anemia, unspecified   . CHF (congestive heart failure) ([place])   . Chronic ischemic heart disease, unspecified   . COPD (chronic obstructive pulmonary disease) ([place])   . Dependence on supplemental oxygen   . Diabetes mellitus without complication ([place])   . Gout, unspecified   . Hypertension   . Mixed hyperlipidemia   . Obstructive chronic bronchitis without exacerbation ([place])   . Obstructive sleep apnea syndrome   . Peripheral venous insufficiency   . Primary generalized hypertrophic osteoarthrosis   . Primary hypothyroidism   . Severe recurrent major depression with psychotic features ([place])    Family History  Problem Relation Age of Onset  . Hypertension Mother   . Diabetes Mother   . CAD Mother   . CAD Father   . Diabetes Father   . Hypertension Father   . Hypertension Sister   . Diabetes Sister   . CAD Sister    Past Surgical History:  Procedure Laterality Date  . TONSILLECTOMY     Patient Active Problem List   Diagnosis Date Noted  . Furuncle of vulva 02/28/2019  . Cellulitis of left lower extremity 12/31/2018  . Diabetic ulcer of left lower leg associated with type 2 diabetes mellitus ([place]) 12/31/2018    . Uncontrolled type 2 diabetes mellitus with hyperglycemia ([place]) 12/31/2018  . Chronic pain syndrome 08/03/2017  . Oxygen dependent 08/03/2017  . Thrombocytopenia ([place]) 09/28/2015  . Anemia in chronic kidney disease 09/28/2015  . Diarrhea 11/28/2014  . AKI (acute kidney injury) ([place]) 11/28/2014  . COPD (chronic obstructive pulmonary disease) ([place]) 11/28/2014  . Uncontrolled type 2 diabetes mellitus with hypoglycemia ([place]) 11/28/2014  . HTN (hypertension) 11/28/2014      Prior to Admission medications   Medication Sig Start Date End Date Taking? Authorizing Provider  traMADol (ULTRAM) 50 MG tablet TAKE 1 TABLET BY MOUTH EVERY 12 HOURS ASNEEDED FOR MODERATE PAIN 06/29/19   Kendell Bane, NP  ADVAIR DISKUS 250-50 MCG/DOSE AEPB INHALE 1 PUFF BY MOUTH INTO THE LUNGS 2 TIMES A DAY 05/27/19   Kendell Bane, NP  allopurinol (ZYLOPRIM) 100 MG tablet TAKE 1 TABLET BY MOUTH DAILY 05/27/19   Kendell Bane, NP  amitriptyline (ELAVIL) 25 MG tablet TAKE 2 TABLETS BY MOUTH AT BEDTIME 05/27/19   Kendell Bane, NP  aspirin EC 81 MG tablet Take 81 mg by mouth daily.    [provider]  Blood Glucose Monitoring Suppl (CONTOUR NEXT MONITOR) w/Device KIT by Does not apply route.    [provider]  clopidogrel (PLAVIX) 75 MG tablet TAKE 1 TABLET BY MOUTH DAILY 05/27/19   Kendell Bane, NP  [place]  RESPIMAT 20-100 MCG/ACT AERS respimat INHALE 1 PUFF BY MOUTH INTO THE LUNGS EVERY 6 HOURS. 05/27/19   Kendell Bane, NP  diclofenac sodium (VOLTAREN) 1 % GEL Apply 2 g topically 3 (three) times daily. 03/26/17   Ronnell Freshwater, NP  docusate sodium (COLACE) 100 MG capsule Take 50 mg by mouth daily as needed. Reported on 06/28/2015    [provider]  doxycycline (VIBRA-TABS) 100 MG tablet Take 1 tablet (100 mg total) by mouth 2 (two) times daily. 03/26/19   Ronnell Freshwater, NP  DULoxetine (CYMBALTA) 60 MG capsule TAKE ONE CAPSULE BY MOUTH TWICE A DAY 05/27/19   Kendell Bane, NP   fluticasone (FLONASE) 50 MCG/ACT nasal spray PLACE 2 SPRAYS INTO BOTH NOSTRILS DAILY 05/27/19   Kendell Bane, NP  folic acid (FOLVITE) 1 MG tablet TAKE 1 TABLET BY MOUTH DAILY 05/27/19   Kendell Bane, NP  glucose blood (ACCU-CHEK AVIVA PLUS) test strip Use as instructed 11/14/17   Kendell Bane, NP  glucose blood (CONTOUR NEXT TEST) test strip Use as instructed 4 times a daily Diag E11.65    [provider]  insulin aspart (NOVOLOG FLEXPEN) 100 UNIT/ML FlexPen Use as directed with sliding scale maximum 63 units no more 03/30/19   Kendell Bane, NP  Insulin Glargine (BASAGLAR KWIKPEN) 100 UNIT/ML INJECT 50 UNITS SUBCUTANEOUSLY EVERY DAYAND 18 TO 24 UNITS AT BEDTIME 05/12/19   Kendell Bane, NP  Insulin Pen Needle (GLOBAL EASE INJECT PEN NEEDLES) 32G X 4 MM MISC Use  As directed 12/28/18   Ronnell Freshwater, NP  isosorbide mononitrate (IMDUR) 30 MG 24 hr tablet TAKE 1 TABLET BY MOUTH DAILY 05/27/19   Kendell Bane, NP  Lancets 33G MISC by Does not apply route. Use as directed 4 times a daily diag E11.65    [provider]  lidocaine (XYLOCAINE) 2 % solution Apply small amount to affected areas three to four times daily prn pain. 04/16/19   Ronnell Freshwater, NP  loratadine (CLARITIN) 10 MG tablet TAKE 1 TABLET BY MOUTH DAILY 06/23/19   Kendell Bane, NP  Melatonin 10 MG TABS Take 40 mg by mouth at bedtime.    [provider]  metoprolol succinate (TOPROL-XL) 25 MG 24 hr tablet TAKE 1 TABLET BY MOUTH DAILY 05/27/19   Kendell Bane, NP  Multiple Vitamins-Minerals (MULTIVITAMIN WITH MINERALS) tablet Take 1 tablet by mouth daily.    [provider]  mupirocin ointment (BACTROBAN) 2 % Apply small amount to affected area BID 12/30/18   Boscia, Heather E, NP  nitroGLYCERIN (NITROLINGUAL) 0.4 MG/SPRAY spray PLACE 1 SPRAY UNDER THE TONGUE EVERY 5 MINUTES FOR 3 DOSES AS NEEDED FOR CHEST PAIN. 06/19/18   Ronnell Freshwater, NP  nystatin [facility]) powder APPLY TOPICALLY  6 TIMES DAILY 03/01/19   Ronnell Freshwater, NP  omeprazole (PRILOSEC) 40 MG capsule TAKE 1 CAPSULE BY MOUTH DAILY 05/27/19   Kendell Bane, NP  OXYGEN Inhale into the lungs. Inhale 4 liters into lungs continously    [provider]  Phendimetrazine Tartrate 35 MG TABS Take 1 tablet po bid prior to meals. 04/21/18   Kendell Bane, NP  silver sulfADIAZINE (SILVADENE) 1 % cream APPLY 1 APPLICATION TOPICALLY DAILY 02/15/19   Ronnell Freshwater, NP  simvastatin (ZOCOR) 80 MG tablet TAKE 1 TABLET BY MOUTH DAILY AT 6 PM 05/27/19   Kendell Bane, NP  sulfamethoxazole-trimethoprim (BACTRIM DS) 800-160 MG tablet Take 1  tablet by mouth 2 (two) times daily. 02/23/19   Ronnell Freshwater, NP  VICTOZA 18 MG/3ML SOPN INJECT 1.8 MG EVERY MORNING 05/27/19   Kendell Bane, NP    Allergies Patient has no known allergies.    Social History Social History   Tobacco Use  . Smoking status: Former Research scientist (life sciences)  . Smokeless tobacco: Never Used  Substance Use Topics  . Alcohol use: No  . Drug use: No    Review of Systems Patient denies headaches, rhinorrhea, blurry vision, numbness, shortness of breath, chest pain, edema, cough, abdominal pain, nausea, vomiting, diarrhea, dysuria, fevers, rashes or hallucinations unless otherwise stated above in HPI. ____________________________________________   PHYSICAL EXAM:  VITAL SIGNS: Vitals:   07/03/19 2154 07/03/19 2157  BP:  (!) 133/45  Pulse:  88  Resp:  (!) 24  Temp:  98.3 F (36.8 C)  SpO2: 99% 99%    Constitutional: Alert and oriented. Ill appearing Eyes: Conjunctivae are normal.  Head: Atraumatic. Nose: No congestion/rhinnorhea. Mouth/Throat: Mucous membranes are moist.   Neck: No stridor. Painless ROM.  Cardiovascular: Normal rate, regular rhythm. Grossly normal heart sounds.  Good peripheral circulation. Respiratory: mild tachypnea with diminished posterio lung sounds.  Scattered faint wheeze Gastrointestinal: Soft and nontender. No  distention. No abdominal bruits. No CVA tenderness. Genitourinary: deferred Musculoskeletal: No lower extremity tenderness nor edema.  No joint effusions. Neurologic:  Normal speech and language. No gross focal neurologic deficits are appreciated. No facial droop Skin:  Skin is warm, dry and intact. No rash noted. Psychiatric: Mood and affect are normal. Speech and behavior are normal.  ____________________________________________   LABS (all labs ordered are listed, but only abnormal results are displayed)  Results for orders placed or performed during the hospital encounter of 07/03/19 (from the past 24 hour(s))  CBC with Differential/Platelet     Status: Abnormal   Collection Time: 07/03/19 11:07 PM  Result Value Ref Range   WBC 7.9 4.0 - 10.5 K/uL   RBC 2.92 (L) 3.87 - 5.11 MIL/uL   Hemoglobin 9.0 (L) 12.0 - 15.0 g/dL   HCT 26.1 (L) 36.0 - 46.0 %   MCV 89.4 80.0 - 100.0 fL   MCH 30.8 26.0 - 34.0 pg   MCHC 34.5 30.0 - 36.0 g/dL   RDW 15.9 (H) 11.5 - 15.5 %   Platelets 132 (L) 150 - 400 K/uL   nRBC 0.0 0.0 - 0.2 %   Neutrophils Relative % 83 %   Neutro Abs 6.5 1.7 - 7.7 K/uL   Lymphocytes Relative 10 %   Lymphs Abs 0.8 0.7 - 4.0 K/uL   Monocytes Relative 6 %   Monocytes Absolute 0.5 0.1 - 1.0 K/uL   Eosinophils Relative 1 %   Eosinophils Absolute 0.1 0.0 - 0.5 K/uL   Basophils Relative 0 %   Basophils Absolute 0.0 0.0 - 0.1 K/uL   Immature Granulocytes 0 %   Abs Immature Granulocytes 0.03 0.00 - 0.07 K/uL  Comprehensive metabolic panel     Status: Abnormal   Collection Time: 07/03/19 11:07 PM  Result Value Ref Range   Sodium 137 135 - 145 mmol/L   Potassium 3.8 3.5 - 5.1 mmol/L   Chloride 104 98 - 111 mmol/L   CO2 26 22 - 32 mmol/L   Glucose, Bld 172 (H) 70 - 99 mg/dL   BUN 32 (H) 8 - 23 mg/dL   Creatinine, Ser 1.66 (H) 0.44 - 1.00 mg/dL   Calcium 8.1 (L) 8.9 - 10.3 mg/dL  Total Protein 6.1 (L) 6.5 - 8.1 g/dL   Albumin 3.0 (L) 3.5 - 5.0 g/dL   AST 15 15 - 41 U/L     ALT 15 0 - 44 U/L   Alkaline Phosphatase 69 38 - 126 U/L   Total Bilirubin 0.4 0.3 - 1.2 mg/dL   GFR calc non Af Amer 32 (L) >60 mL/min   GFR calc Af Amer 37 (L) >60 mL/min   Anion gap 7 5 - 15  Troponin I (High Sensitivity)     Status: None   Collection Time: 07/03/19 11:07 PM  Result Value Ref Range   Troponin I (High Sensitivity) 14 <18 ng/L   ____________________________________________  EKG My review and personal interpretation at Time: 21:58   Indication: chest pain  Rate: 85  Rhythm: afib Axis: normal Other: no stemi, nonspecific st abn ____________________________________________  RADIOLOGY  I personally reviewed all radiographic images ordered to evaluate for the above acute complaints and reviewed radiology reports and findings.  These findings were personally discussed with the patient.  Please see medical record for radiology report.  ____________________________________________   PROCEDURES  Procedure(s) performed:  Procedures    Critical Care performed: no ____________________________________________   INITIAL IMPRESSION / ASSESSMENT AND PLAN / ED COURSE  Pertinent labs & imaging results that were available during my care of the patient were reviewed by me and considered in my medical decision making (see chart for details).   DDX: ACS, pericarditis, esophagitis, boerhaaves, pe, dissection, pna, bronchitis, costochondritis   ANDRELL TALLMAN is a 64 y.o. who presents to the ED with chest pain.  Patient does wear chronic O2 has a history of COPD as well as CHF.  Patient called EMS due to having severe pain and pressure feeling someone sitting on her chest.  According to EMS patient appeared pale diaphoretic and critically ill-appearing was given aspirin as well as nitro with improvement in symptoms.  Stress the ER feeling well.  Denies any melena or hematochezia.  Has been using her inhalers as prescribed.  Denies any pain radiating through to her back.   Denies any pleuritic pain.  Given patient's history and risk factors do feel that she would benefit from observation the hospital. Hypertensive urgency.  I will see any evidence of flash pulmonary edema.  Initial troponin is negative but patient with extensive comorbidities.     The patient was evaluated in Emergency Department today for the symptoms described in the history of present illness. He/she was evaluated in the context of the global COVID-19 pandemic, which necessitated consideration that the patient might be at risk for infection with the SARS-CoV-2 virus that causes COVID-19. Institutional protocols and algorithms that pertain to the evaluation of patients at risk for COVID-19 are in a state of rapid change based on information released by regulatory bodies including the CDC and federal and state organizations. These policies and algorithms were followed during the patient's care in the ED.  As part of my medical decision making, I reviewed the following data within the [facility] notes reviewed and incorporated, Labs reviewed, notes from prior ED visits and [place] Controlled Substance Database   ____________________________________________   FINAL CLINICAL IMPRESSION(S) / ED DIAGNOSES  Final diagnoses:  Chest pain, unspecified type      NEW MEDICATIONS STARTED DURING THIS VISIT:  New Prescriptions   No medications on file     Note:  This document was prepared using Dragon voice recognition software and may include unintentional dictation  errors.    Merlyn Lot, MD 07/04/19 [phone]

## 2019-07-03 NOTE — ED Triage Notes (Signed)
Arrives [facility] from home w cc of shob. Pt was getting into bed when it happened. Per neighbor pt was diaphoretic and pale. Pt had 2/10 CP. Pt took 4 baby aspirins. 1/10 CP. Pt has hx COPD, diabetes. Denies n/v/d, fevers, LOC, dizziness or pain radiation.

## 2019-07-04 DIAGNOSIS — I10 Essential (primary) hypertension: Secondary | ICD-10-CM

## 2019-07-04 DIAGNOSIS — Z794 Long term (current) use of insulin: Secondary | ICD-10-CM

## 2019-07-04 DIAGNOSIS — E119 Type 2 diabetes mellitus without complications: Secondary | ICD-10-CM | POA: Diagnosis not present

## 2019-07-04 DIAGNOSIS — E785 Hyperlipidemia, unspecified: Secondary | ICD-10-CM

## 2019-07-04 DIAGNOSIS — R079 Chest pain, unspecified: Secondary | ICD-10-CM | POA: Diagnosis not present

## 2019-07-04 LAB — CBC
HCT: 25.8 % — ABNORMAL LOW (ref 36.0–46.0)
Hemoglobin: 8.4 g/dL — ABNORMAL LOW (ref 12.0–15.0)
MCH: 30.3 pg (ref 26.0–34.0)
MCHC: 32.6 g/dL (ref 30.0–36.0)
MCV: 93.1 fL (ref 80.0–100.0)
Platelets: 139 10*3/uL — ABNORMAL LOW (ref 150–400)
RBC: 2.77 MIL/uL — ABNORMAL LOW (ref 3.87–5.11)
RDW: 15.7 % — ABNORMAL HIGH (ref 11.5–15.5)
WBC: 7.9 10*3/uL (ref 4.0–10.5)
nRBC: 0 % (ref 0.0–0.2)

## 2019-07-04 LAB — RESPIRATORY PANEL BY RT PCR (FLU A&B, COVID)
Influenza A by PCR: NEGATIVE
Influenza B by PCR: NEGATIVE
SARS Coronavirus 2 by RT PCR: NEGATIVE

## 2019-07-04 LAB — TROPONIN I (HIGH SENSITIVITY)
Troponin I (High Sensitivity): 14 ng/L (ref <18)
Troponin I (High Sensitivity): 11 ng/L (ref <18)

## 2019-07-04 LAB — LIPID PANEL
Cholesterol: 106 mg/dL (ref 0–200)
HDL: 36 mg/dL — ABNORMAL LOW (ref >40)
LDL Cholesterol: 36 mg/dL (ref 0–99)
Total CHOL/HDL Ratio: 2.9 RATIO
Triglycerides: 172 mg/dL — ABNORMAL HIGH (ref <150)
VLDL: 34 mg/dL (ref 0–40)

## 2019-07-04 LAB — BRAIN NATRIURETIC PEPTIDE: B Natriuretic Peptide: 97 pg/mL (ref 0.0–100.0)

## 2019-07-04 LAB — CREATININE, SERUM
Creatinine, Ser: 1.69 mg/dL — ABNORMAL HIGH (ref 0.44–1.00)
GFR calc Af Amer: 36 mL/min — ABNORMAL LOW (ref >60)
GFR calc non Af Amer: 31 mL/min — ABNORMAL LOW (ref >60)

## 2019-07-04 LAB — HIV ANTIBODY (ROUTINE TESTING W REFLEX): HIV Screen 4th Generation wRfx: NONREACTIVE

## 2019-07-04 MED ORDER — ATORVASTATIN CALCIUM 20 MG PO TABS: 40.0000 mg | ORAL_TABLET | Freq: Every day | ORAL | Status: DC

## 2019-07-04 MED ORDER — ATORVASTATIN CALCIUM 40 MG PO TABS: 40.0000 mg | ORAL_TABLET | Freq: Every day | ORAL | 1 refills | Status: DC

## 2019-07-04 MED ORDER — METOPROLOL SUCCINATE ER 50 MG PO TB24: 25.0000 mg | ORAL_TABLET | Freq: Every day | ORAL | Status: DC

## 2019-07-04 MED ORDER — CLOPIDOGREL BISULFATE 75 MG PO TABS: 75.0000 mg | ORAL_TABLET | Freq: Every day | ORAL | Status: DC

## 2019-07-04 MED ORDER — ALPRAZOLAM 0.5 MG PO TABS: 0.2500 mg | ORAL_TABLET | Freq: Two times a day (BID) | ORAL | Status: DC | PRN

## 2019-07-04 MED ORDER — NITROGLYCERIN 0.4 MG SL SUBL: 0.4000 mg | SUBLINGUAL_TABLET | SUBLINGUAL | Status: DC | PRN

## 2019-07-04 MED ORDER — LIDOCAINE VISCOUS HCL 2 % MT SOLN
15.0000 mL | Freq: Once | OROMUCOSAL | Status: AC
  Administered 2019-07-04: 15 mL via ORAL
  Filled 2019-07-04: qty 15

## 2019-07-04 MED ORDER — ONDANSETRON HCL 4 MG/2ML IJ SOLN: 4.0000 mg | Freq: Four times a day (QID) | INTRAMUSCULAR | Status: DC | PRN

## 2019-07-04 MED ORDER — ENOXAPARIN SODIUM 40 MG/0.4ML ~~LOC~~ SOLN
40.0000 mg | Freq: Two times a day (BID) | SUBCUTANEOUS | Status: DC
  Administered 2019-07-04: 40 mg via SUBCUTANEOUS
  Filled 2019-07-04: qty 0.4

## 2019-07-04 MED ORDER — INSULIN GLARGINE 100 UNIT/ML ~~LOC~~ SOLN
25.0000 [IU] | Freq: Every day | SUBCUTANEOUS | Status: DC
  Filled 2019-07-04: qty 0.25

## 2019-07-04 MED ORDER — ADULT MULTIVITAMIN W/MINERALS CH: 1.0000 | ORAL_TABLET | Freq: Every day | ORAL | Status: DC

## 2019-07-04 MED ORDER — ATORVASTATIN CALCIUM 20 MG PO TABS: 20.0000 mg | ORAL_TABLET | Freq: Every day | ORAL | Status: DC

## 2019-07-04 MED ORDER — FLUTICASONE PROPIONATE 50 MCG/ACT NA SUSP
2.0000 | Freq: Every day | NASAL | Status: DC | PRN
  Filled 2019-07-04: qty 16

## 2019-07-04 MED ORDER — DULOXETINE HCL 60 MG PO CPEP
60.0000 mg | ORAL_CAPSULE | Freq: Two times a day (BID) | ORAL | Status: DC
  Administered 2019-07-04: 60 mg via ORAL
  Filled 2019-07-04: qty 1

## 2019-07-04 MED ORDER — ISOSORBIDE MONONITRATE ER 60 MG PO TB24: 30.0000 mg | ORAL_TABLET | Freq: Every day | ORAL | Status: DC

## 2019-07-04 MED ORDER — ALUM & MAG HYDROXIDE-SIMETH 200-200-20 MG/5ML PO SUSP
30.0000 mL | Freq: Once | ORAL | Status: AC
  Administered 2019-07-04: 30 mL via ORAL
  Filled 2019-07-04: qty 30

## 2019-07-04 MED ORDER — LORATADINE 10 MG PO TABS: 10.0000 mg | ORAL_TABLET | Freq: Every day | ORAL | Status: DC

## 2019-07-04 MED ORDER — ASPIRIN EC 325 MG PO TBEC: 325.0000 mg | DELAYED_RELEASE_TABLET | Freq: Every day | ORAL | Status: DC

## 2019-07-04 MED ORDER — SODIUM CHLORIDE 0.9 % IV SOLN: INTRAVENOUS | Status: DC

## 2019-07-04 MED ORDER — DOCUSATE SODIUM 100 MG PO CAPS: 100.0000 mg | ORAL_CAPSULE | Freq: Every day | ORAL | Status: DC | PRN

## 2019-07-04 MED ORDER — FOLIC ACID 1 MG PO TABS: 1.0000 mg | ORAL_TABLET | Freq: Every day | ORAL | Status: DC

## 2019-07-04 MED ORDER — ASPIRIN EC 81 MG PO TBEC: 81.0000 mg | DELAYED_RELEASE_TABLET | Freq: Every day | ORAL | Status: DC

## 2019-07-04 MED ORDER — ACETAMINOPHEN 325 MG PO TABS: 650.0000 mg | ORAL_TABLET | ORAL | Status: DC | PRN

## 2019-07-04 MED ORDER — DOXYCYCLINE HYCLATE 100 MG PO TABS: 100.0000 mg | ORAL_TABLET | Freq: Two times a day (BID) | ORAL | Status: DC

## 2019-07-04 MED ORDER — ALLOPURINOL 100 MG PO TABS
100.0000 mg | ORAL_TABLET | Freq: Every day | ORAL | Status: DC
  Filled 2019-07-04: qty 1

## 2019-07-04 MED ORDER — MELATONIN 5 MG PO TABS: 10.0000 mg | ORAL_TABLET | Freq: Every day | ORAL | Status: DC

## 2019-07-04 MED ORDER — ZOLPIDEM TARTRATE 5 MG PO TABS: 5.0000 mg | ORAL_TABLET | Freq: Every evening | ORAL | Status: DC | PRN

## 2019-07-04 MED ORDER — PANTOPRAZOLE SODIUM 40 MG PO TBEC: 40.0000 mg | DELAYED_RELEASE_TABLET | Freq: Every day | ORAL | Status: DC

## 2019-07-04 MED ORDER — NITROGLYCERIN 0.4 MG/SPRAY TL SOLN: 2.0000 | Status: DC | PRN

## 2019-07-04 MED ORDER — BASAGLAR KWIKPEN 100 UNIT/ML ~~LOC~~ SOPN: 25.0000 [IU] | PEN_INJECTOR | Freq: Every day | SUBCUTANEOUS | Status: DC

## 2019-07-04 MED ORDER — MUPIROCIN 2 % EX OINT
TOPICAL_OINTMENT | Freq: Two times a day (BID) | CUTANEOUS | Status: DC
  Filled 2019-07-04: qty 22

## 2019-07-04 MED ORDER — MORPHINE SULFATE (PF) 2 MG/ML IV SOLN: 2.0000 mg | INTRAVENOUS | Status: DC | PRN

## 2019-07-04 MED ORDER — SULFAMETHOXAZOLE-TRIMETHOPRIM 800-160 MG PO TABS: 1.0000 | ORAL_TABLET | Freq: Two times a day (BID) | ORAL | Status: DC

## 2019-07-04 MED ORDER — IPRATROPIUM-ALBUTEROL 0.5-2.5 (3) MG/3ML IN SOLN: 3.0000 mL | Freq: Four times a day (QID) | RESPIRATORY_TRACT | Status: DC | PRN

## 2019-07-04 MED ORDER — MELATONIN 10 MG PO TABS: 40.0000 mg | ORAL_TABLET | Freq: Every day | ORAL | Status: DC

## 2019-07-04 MED ORDER — TRAMADOL HCL 50 MG PO TABS: 50.0000 mg | ORAL_TABLET | Freq: Two times a day (BID) | ORAL | Status: DC | PRN

## 2019-07-04 NOTE — Discharge Summary (Signed)
Physician Discharge Summary  TZIVIA ONEIL QZE:[id number] DOB: [birth year] DOA: 07/03/2019  PCP: Lavera Guise, MD  Admit date: 07/03/2019 Discharge date: 07/04/2019  Admitted From: Home Disposition: Home   Recommendations for Outpatient Follow-up:  1. Follow up with PCP in 1-2 weeks 2. Follow-up with cardiologist 3. Please obtain BMP/CBC in one week 4. Please follow up on the following pending results:None  Home Health:No Equipment/Devices: Home oxygen Discharge Condition: Stable  CODE STATUS: Full Diet recommendation: Heart Healthy / Carb Modified   Brief/Interim Summary: Deanna Rice  is a 64 y.o. obese Caucasian female with a known history of CHF, coronary artery disease, COPD, type 2 diabetes mellitus, hypertension and dyslipidemia as well as obstructive sleep apnea, who presented to the emergency room with acute onset of midsternal chest pain from pressure and tightness with associated diaphoresis and feeling clammy.  EKG with atrial fibrillation with well-controlled rate.  Troponin remains negative x3.  Next morning her chest pain improves and she was eager to go home, stating that she is very uncomfortable in this bed.  Patient is obese with many risk factors and needs further evaluation by a cardiologist.  She was provided with cardiology appointment with Dr. Humphrey Rolls who is  her cardiologist and she will follow with them as an outpatient for further evaluation if needed.  She will continue rest of her home meds and will follow up with her primary care physician for further management and risk reduction.  Discharge Diagnoses:  Active Problems:   Chest pain  Discharge Instructions  Discharge Instructions    Diet - low sodium heart healthy   Complete by: As directed    Discharge instructions   Complete by: As directed    It was pleasure taking care of you. As we discussed it is important that you see a cardiologist for further investigation of your chest pain.  At this time it does  not look coming from your heart but you do have multiple risk factors. Please follow-up with your primary care physician for further management.   Increase activity slowly   Complete by: As directed      Allergies as of 07/04/2019   No Known Allergies     Medication List    STOP taking these medications   simvastatin 80 MG tablet Commonly known as: ZOCOR Replaced by: atorvastatin 40 MG tablet     TAKE these medications   Advair Diskus 250-50 MCG/DOSE Aepb Generic drug: Fluticasone-Salmeterol INHALE 1 PUFF BY MOUTH INTO THE LUNGS 2 TIMES A DAY   allopurinol 100 MG tablet Commonly known as: ZYLOPRIM TAKE 1 TABLET BY MOUTH DAILY   amitriptyline 25 MG tablet Commonly known as: ELAVIL TAKE 2 TABLETS BY MOUTH AT BEDTIME   aspirin EC 81 MG tablet Take 81 mg by mouth daily.   atorvastatin 40 MG tablet Commonly known as: LIPITOR Take 1 tablet (40 mg total) by mouth daily. Replaces: simvastatin 80 MG tablet   Basaglar KwikPen 100 UNIT/ML INJECT 50 UNITS SUBCUTANEOUSLY EVERY DAYAND 18 TO 24 UNITS AT BEDTIME   clopidogrel 75 MG tablet Commonly known as: PLAVIX TAKE 1 TABLET BY MOUTH DAILY   Combivent Respimat 20-100 MCG/ACT Aers respimat Generic drug: Ipratropium-Albuterol INHALE 1 PUFF BY MOUTH INTO THE LUNGS EVERY 6 HOURS.   Contour Next Monitor w/Device Kit by Does not apply route.   diclofenac sodium 1 % Gel Commonly known as: VOLTAREN Apply 2 g topically 3 (three) times daily.   docusate sodium 100 MG capsule Commonly known as:  COLACE Take 50 mg by mouth daily as needed. Reported on 06/28/2015   DULoxetine 60 MG capsule Commonly known as: CYMBALTA TAKE ONE CAPSULE BY MOUTH TWICE A DAY   fluticasone 50 MCG/ACT nasal spray Commonly known as: FLONASE PLACE 2 SPRAYS INTO BOTH NOSTRILS DAILY   folic acid 1 MG tablet Commonly known as: FOLVITE TAKE 1 TABLET BY MOUTH DAILY   Global Ease Inject Pen Needles 32G X 4 MM Misc Generic drug: Insulin Pen Needle Use   As directed   Contour Next Test test strip Generic drug: glucose blood Use as instructed 4 times a daily Diag E11.65   glucose blood test strip Commonly known as: Accu-Chek Aviva Plus Use as instructed   isosorbide mononitrate 30 MG 24 hr tablet Commonly known as: IMDUR TAKE 1 TABLET BY MOUTH DAILY   Lancets 33G Misc by Does not apply route. Use as directed 4 times a daily diag E11.65   lidocaine 2 % solution Commonly known as: XYLOCAINE Apply small amount to affected areas three to four times daily prn pain.   loratadine 10 MG tablet Commonly known as: CLARITIN TAKE 1 TABLET BY MOUTH DAILY   Melatonin 10 MG Tabs Take 40 mg by mouth at bedtime.   metoprolol succinate 25 MG 24 hr tablet Commonly known as: TOPROL-XL TAKE 1 TABLET BY MOUTH DAILY   multivitamin with minerals tablet Take 1 tablet by mouth daily.   mupirocin ointment 2 % Commonly known as: BACTROBAN Apply small amount to affected area BID   nitroGLYCERIN 0.4 MG/SPRAY spray Commonly known as: NITROLINGUAL PLACE 1 SPRAY UNDER THE TONGUE EVERY 5 MINUTES FOR 3 DOSES AS NEEDED FOR CHEST PAIN.   NovoLOG FlexPen 100 UNIT/ML FlexPen Generic drug: insulin aspart Use as directed with sliding scale maximum 63 units no more   nystatin powder Commonly known as: Nyamyc APPLY TOPICALLY 6 TIMES DAILY   omeprazole 40 MG capsule Commonly known as: PRILOSEC TAKE 1 CAPSULE BY MOUTH DAILY   OXYGEN Inhale into the lungs. Inhale 4 liters into lungs continously   Phendimetrazine Tartrate 35 MG Tabs Take 1 tablet po bid prior to meals.   silver sulfADIAZINE 1 % cream Commonly known as: SILVADENE APPLY 1 APPLICATION TOPICALLY DAILY   traMADol 50 MG tablet Commonly known as: ULTRAM TAKE 1 TABLET BY MOUTH EVERY 12 HOURS ASNEEDED FOR MODERATE PAIN   Victoza 18 MG/3ML Sopn Generic drug: liraglutide INJECT 1.8 MG EVERY MORNING      Follow-up Information    Lavera Guise, MD. Schedule an appointment as soon as  possible for a visit.   Specialty: Internal Medicine Contact information: [place] [address] [phone]        Dionisio David, MD. Schedule an appointment as soon as possible for a visit.   Specialty: Cardiology Contact information: [address] [place] [address] [phone]          No Known Allergies  Consultations:  Cardiology  Procedures/Studies: DG Chest Portable 1 View  Result Date: 07/03/2019 CLINICAL DATA:  Shortness of breath. EXAM: PORTABLE CHEST 1 VIEW COMPARISON:  None. FINDINGS: Mild, chronic appearing increased lung markings are seen without evidence of acute infiltrate, pleural effusion or pneumothorax. The heart size and mediastinal contours are within normal limits. Multilevel degenerative changes seen throughout the thoracic spine. IMPRESSION: No active disease. Electronically Signed   By: Virgina Norfolk M.D.   On: 07/03/2019 23:08     Subjective: Patient denies any more chest pain or shortness of  breath.  She wants to go home stating that she is very uncomfortable in this bed.  Discharge Exam: Vitals:   07/04/19 0900 07/04/19 0930  BP: (!) 169/91 (!) 172/84  Pulse: 75 77  Resp: 17   Temp:    SpO2: 98% 100%   Vitals:   07/04/19 0800 07/04/19 0830 07/04/19 0900 07/04/19 0930  BP: (!) 164/72 (!) 166/89 (!) 169/91 (!) 172/84  Pulse: 80 77 75 77  Resp: '16 17 17   ' Temp:      TempSrc:      SpO2: 100% 100% 98% 100%  Weight:      Height:        General: Pt is alert, awake, and morbidly obese not in acute distress Cardiovascular: RRR, S1/S2 +, no rubs, no gallops Respiratory: CTA bilaterally, no wheezing, no rhonchi Abdominal: Soft, NT, ND, bowel sounds + Extremities: no edema, no cyanosis   The results of significant diagnostics from this hospitalization (including imaging, microbiology, ancillary and laboratory) are listed below for reference.    Microbiology: Recent Results (from the past 240 hour(s))   Respiratory Panel by RT PCR (Flu A&B, Covid) - Nasopharyngeal Swab     Status: None   Collection Time: 07/04/19 12:26 AM   Specimen: Nasopharyngeal Swab  Result Value Ref Range Status   SARS Coronavirus 2 by RT PCR NEGATIVE NEGATIVE Final    Comment: (NOTE) SARS-CoV-2 target nucleic acids are NOT DETECTED. The SARS-CoV-2 RNA is generally detectable in upper respiratoy specimens during the acute phase of infection. The lowest concentration of SARS-CoV-2 viral copies this assay can detect is 131 copies/mL. A negative result does not preclude SARS-Cov-2 infection and should not be used as the sole basis for treatment or other patient management decisions. A negative result may occur with  improper specimen collection/handling, submission of specimen other than nasopharyngeal swab, presence of viral mutation(s) within the areas targeted by this assay, and inadequate number of viral copies (<131 copies/mL). A negative result must be combined with clinical observations, patient history, and epidemiological information. The expected result is Negative. Fact Sheet for Patients:  PinkCheek.be Fact Sheet for Healthcare Providers:  GravelBags.it This test is not yet ap proved or cleared by the [place] FDA and  has been authorized for detection and/or diagnosis of SARS-CoV-2 by FDA under an Emergency Use Authorization (EUA). This EUA will remain  in effect (meaning this test can be used) for the duration of the COVID-19 declaration under Section 564(b)(1) of the Act, 21 U.S.C. section 360bbb-3(b)(1), unless the authorization is terminated or revoked sooner.    Influenza A by PCR NEGATIVE NEGATIVE Final   Influenza B by PCR NEGATIVE NEGATIVE Final    Comment: (NOTE) The Xpert Xpress SARS-CoV-2/FLU/RSV assay is intended as an aid in  the diagnosis of influenza from Nasopharyngeal swab specimens and  should not be used as a sole  basis for treatment. Nasal washings and  aspirates are unacceptable for Xpert Xpress SARS-CoV-2/FLU/RSV  testing. Fact Sheet for Patients: PinkCheek.be Fact Sheet for Healthcare Providers: GravelBags.it This test is not yet approved or cleared by the [place] FDA and  has been authorized for detection and/or diagnosis of SARS-CoV-2 by  FDA under an Emergency Use Authorization (EUA). This EUA will remain  in effect (meaning this test can be used) for the duration of the  Covid-19 declaration under Section 564(b)(1) of the Act, 21  U.S.C. section 360bbb-3(b)(1), unless the authorization is  terminated or revoked. Performed at [facility], [phone] [place], [place] [address]      Labs: BNP (last 3 results) Recent Labs    07/03/19 2307  BNP 94.7   Basic Metabolic Panel: Recent Labs  Lab 07/03/19 2307 07/04/19 0150  NA 137  --   K 3.8  --   CL 104  --   CO2 26  --   GLUCOSE 172*  --   BUN 32*  --   CREATININE 1.66* 1.69*  CALCIUM 8.1*  --    Liver Function Tests: Recent Labs  Lab 07/03/19 2307  AST 15  ALT 15  ALKPHOS 69  BILITOT 0.4  PROT 6.1*  ALBUMIN 3.0*   No results for input(s): LIPASE, AMYLASE in the last 168 hours. No results for input(s): AMMONIA in the last 168 hours. CBC: Recent Labs  Lab 07/03/19 2307 07/04/19 0150  WBC 7.9 7.9  NEUTROABS 6.5  --   HGB 9.0* 8.4*  HCT 26.1* 25.8*  MCV 89.4 93.1  PLT 132* 139*   Cardiac Enzymes: No results for input(s): CKTOTAL, CKMB, CKMBINDEX, TROPONINI in the last 168 hours. BNP: Invalid input(s): POCBNP CBG: No results for input(s): GLUCAP in the last 168 hours. D-Dimer No results for input(s): DDIMER in the last 72 hours. Hgb A1c No results for input(s): HGBA1C in the last 72 hours. Lipid Profile Recent Labs    07/04/19 0150  CHOL 106  HDL 36*  LDLCALC 36  TRIG 172*  CHOLHDL 2.9   Thyroid function studies No  results for input(s): TSH, T4TOTAL, T3FREE, THYROIDAB in the last 72 hours.  Invalid input(s): FREET3 Anemia work up No results for input(s): VITAMINB12, FOLATE, FERRITIN, TIBC, IRON, RETICCTPCT in the last 72 hours. Urinalysis    Component Value Date/Time   COLORURINE STRAW (A) 12/28/2015 1948   APPEARANCEUR CLEAR (A) 12/28/2015 1948   LABSPEC 1.003 (L) 12/28/2015 1948   PHURINE 6.0 12/28/2015 1948   GLUCOSEU NEGATIVE 12/28/2015 1948   HGBUR 1+ (A) 12/28/2015 1948   BILIRUBINUR NEGATIVE 12/28/2015 [place] NEGATIVE 12/28/2015 1948   PROTEINUR 100 (A) 12/28/2015 1948   NITRITE POSITIVE (A) 12/28/2015 1948   LEUKOCYTESUR TRACE (A) 12/28/2015 1948   Sepsis Labs Invalid input(s): PROCALCITONIN,  WBC,  LACTICIDVEN Microbiology Recent Results (from the past 240 hour(s))  Respiratory Panel by RT PCR (Flu A&B, Covid) - Nasopharyngeal Swab     Status: None   Collection Time: 07/04/19 12:26 AM   Specimen: Nasopharyngeal Swab  Result Value Ref Range Status   SARS Coronavirus 2 by RT PCR NEGATIVE NEGATIVE Final    Comment: (NOTE) SARS-CoV-2 target nucleic acids are NOT DETECTED. The SARS-CoV-2 RNA is generally detectable in upper respiratoy specimens during the acute phase of infection. The lowest concentration of SARS-CoV-2 viral copies this assay can detect is 131 copies/mL. A negative result does not preclude SARS-Cov-2 infection and should not be used as the sole basis for treatment or other patient management decisions. A negative result may occur with  improper specimen collection/handling, submission of specimen other than nasopharyngeal swab, presence of viral mutation(s) within the areas targeted by this assay, and inadequate number of viral copies (<131 copies/mL). A negative result must be combined with clinical observations, patient history, and epidemiological information. The expected result is Negative. Fact Sheet for Patients:   PinkCheek.be Fact Sheet for Healthcare Providers:  GravelBags.it This test is not yet ap proved or cleared by the [place] FDA and  has been authorized for detection and/or diagnosis of SARS-CoV-2 by FDA  under an Emergency Use Authorization (EUA). This EUA will remain  in effect (meaning this test can be used) for the duration of the COVID-19 declaration under Section 564(b)(1) of the Act, 21 U.S.C. section 360bbb-3(b)(1), unless the authorization is terminated or revoked sooner.    Influenza A by PCR NEGATIVE NEGATIVE Final   Influenza B by PCR NEGATIVE NEGATIVE Final    Comment: (NOTE) The Xpert Xpress SARS-CoV-2/FLU/RSV assay is intended as an aid in  the diagnosis of influenza from Nasopharyngeal swab specimens and  should not be used as a sole basis for treatment. Nasal washings and  aspirates are unacceptable for Xpert Xpress SARS-CoV-2/FLU/RSV  testing. Fact Sheet for Patients: PinkCheek.be Fact Sheet for Healthcare Providers: GravelBags.it This test is not yet approved or cleared by the [place] FDA and  has been authorized for detection and/or diagnosis of SARS-CoV-2 by  FDA under an Emergency Use Authorization (EUA). This EUA will remain  in effect (meaning this test can be used) for the duration of the  Covid-19 declaration under Section 564(b)(1) of the Act, 21  U.S.C. section 360bbb-3(b)(1), unless the authorization is  terminated or revoked. Performed at [facility], [address] [place], [place] [address]     Time coordinating discharge: Over 30 minutes  SIGNED:  Lorella Nimrod, MD  [facility] 07/04/2019, 9:51 AM  If 7PM-7AM, please contact night-coverage [email or web address]  This record has been created using Systems analyst. Errors have been sought and corrected,but may not always be located. Such  creation errors do not reflect on the standard of care.

## 2019-07-04 NOTE — ED Notes (Signed)
MD at bedside. 

## 2019-07-04 NOTE — ED Notes (Signed)
Pt called out stating that she wanted to leave. Informed that this RN would get in touch with doctor about leaving. Pt agreed to stay to wait for doctor to come see her. States she has a friend that can come pick her up and that she is feeling much better at this time.

## 2019-07-04 NOTE — ED Notes (Signed)
Pt hit call bell. Voiced again that is she ready to go and that she's not staying any longer. Asked if her ride was on the way. She stated she needed a phone to call for her ride. Provided phone to pt at this time.

## 2019-07-04 NOTE — H&P (Addendum)
[facility] [place] NAME: Carlota Philley    MR#:  [id number]  DATE OF BIRTH:  04-11-1955  DATE OF ADMISSION:  07/04/2019  PRIMARY CARE PHYSICIAN: Lavera Guise, MD   REQUESTING/REFERRING PHYSICIAN: Merlyn Lot, MD CHIEF COMPLAINT:   Chief Complaint  Patient presents with  . Shortness of Breath    HISTORY OF PRESENT ILLNESS:  Jaquetta Currier  is a 64 y.o. obese Caucasian female with a known history of CHF, coronary artery disease, COPD, type 2 diabetes mellitus, hypertension and dyslipidemia as well as obstructive sleep apnea, who presented to the emergency room with acute onset of midsternal chest pain from pressure and tightness with associated diaphoresis and feeling clammy.  She admits to radiation of her pain to her neck and her left arm.  No associated dyspnea or palpitations.  No cough or wheezing or hemoptysis.  She denies any leg pain or edema recent travels or surgeries.  Upon presentation to the emergency room, blood pressure was 122/58 with otherwise normal vital signs.  Labs revealed a blood glucose of 172, BUN of 32 and creatinine 1.66 and later 1.69.  BNP came back 97.  High-sensitivity troponin I was 14 and later 14.  CBC showed anemia with hemoglobin of 8.4 hematocrit 25.8 compared to 9 and 26.1 COVID-19 PCR and influenza antigens came back negative.  Portable chest x-ray showed acute cardiopulmonary disease. EKG showed atrial fibrillation with controlled ventricular response of 85 and low voltage QRS.  The patient was given aspirin and an inch of Nitropaste as well as DuoNeb.  The patient will be admitted to a progressive cardiac observation unit bed for further evaluation and management. PAST MEDICAL HISTORY:   Past Medical History:  Diagnosis Date  . Anemia, unspecified   . CHF (congestive heart failure) ([place])   . Chronic ischemic heart disease, unspecified   . COPD (chronic obstructive pulmonary disease) ([place])   . Dependence on supplemental  oxygen   . Diabetes mellitus without complication ([place])   . Gout, unspecified   . Hypertension   . Mixed hyperlipidemia   . Obstructive chronic bronchitis without exacerbation ([place])   . Obstructive sleep apnea syndrome   . Peripheral venous insufficiency   . Primary generalized hypertrophic osteoarthrosis   . Primary hypothyroidism   . Severe recurrent major depression with psychotic features ([place])     PAST SURGICAL HISTORY:   Past Surgical History:  Procedure Laterality Date  . TONSILLECTOMY      SOCIAL HISTORY:   Social History   Tobacco Use  . Smoking status: Former Research scientist (life sciences)  . Smokeless tobacco: Never Used  Substance Use Topics  . Alcohol use: No    FAMILY HISTORY:   Family History  Problem Relation Age of Onset  . Hypertension Mother   . Diabetes Mother   . CAD Mother   . CAD Father   . Diabetes Father   . Hypertension Father   . Hypertension Sister   . Diabetes Sister   . CAD Sister     DRUG ALLERGIES:  No Known Allergies  REVIEW OF SYSTEMS:   ROS As per history of present illness. All pertinent systems were reviewed above. Constitutional,  HEENT, cardiovascular, respiratory, GI, GU, musculoskeletal, neuro, psychiatric, endocrine,  integumentary and hematologic systems were reviewed and are otherwise  negative/unremarkable except for positive findings mentioned above in the HPI.   MEDICATIONS AT HOME:   Prior to Admission medications   Medication Sig Start Date End Date Taking?  Authorizing Provider  ADVAIR DISKUS 250-50 MCG/DOSE AEPB INHALE 1 PUFF BY MOUTH INTO THE LUNGS 2 TIMES A DAY 05/27/19  Yes Scarboro, Audie Clear, NP  allopurinol (ZYLOPRIM) 100 MG tablet TAKE 1 TABLET BY MOUTH DAILY 05/27/19  Yes Scarboro, Audie Clear, NP  amitriptyline (ELAVIL) 25 MG tablet TAKE 2 TABLETS BY MOUTH AT BEDTIME 05/27/19  Yes Scarboro, Audie Clear, NP  aspirin EC 81 MG tablet Take 81 mg by mouth daily.   Yes [provider]  Blood Glucose Monitoring Suppl (CONTOUR NEXT  MONITOR) w/Device KIT by Does not apply route.   Yes [provider]  clopidogrel (PLAVIX) 75 MG tablet TAKE 1 TABLET BY MOUTH DAILY 05/27/19  Yes Scarboro, Audie Clear, NP  COMBIVENT RESPIMAT 20-100 MCG/ACT AERS respimat INHALE 1 PUFF BY MOUTH INTO THE LUNGS EVERY 6 HOURS. 05/27/19  Yes Scarboro, Audie Clear, NP  diclofenac sodium (VOLTAREN) 1 % GEL Apply 2 g topically 3 (three) times daily. 03/26/17  Yes Boscia, Heather E, NP  docusate sodium (COLACE) 100 MG capsule Take 50 mg by mouth daily as needed. Reported on 06/28/2015   Yes [provider]  DULoxetine (CYMBALTA) 60 MG capsule TAKE ONE CAPSULE BY MOUTH TWICE A DAY 05/27/19  Yes Scarboro, Audie Clear, NP  fluticasone (FLONASE) 50 MCG/ACT nasal spray PLACE 2 SPRAYS INTO BOTH NOSTRILS DAILY 05/27/19  Yes Scarboro, Audie Clear, NP  folic acid (FOLVITE) 1 MG tablet TAKE 1 TABLET BY MOUTH DAILY 05/27/19  Yes Kendell Bane, NP  glucose blood (ACCU-CHEK AVIVA PLUS) test strip Use as instructed 11/14/17  Yes Scarboro, Audie Clear, NP  glucose blood (CONTOUR NEXT TEST) test strip Use as instructed 4 times a daily Diag E11.65   Yes [provider]  insulin aspart (NOVOLOG FLEXPEN) 100 UNIT/ML FlexPen Use as directed with sliding scale maximum 63 units no more 03/30/19  Yes Scarboro, Audie Clear, NP  Insulin Glargine (BASAGLAR KWIKPEN) 100 UNIT/ML INJECT 50 UNITS SUBCUTANEOUSLY EVERY DAYAND 18 TO 24 UNITS AT BEDTIME 05/12/19  Yes Scarboro, Audie Clear, NP  Insulin Pen Needle (GLOBAL EASE INJECT PEN NEEDLES) 32G X 4 MM MISC Use  As directed 12/28/18  Yes Boscia, Heather E, NP  isosorbide mononitrate (IMDUR) 30 MG 24 hr tablet TAKE 1 TABLET BY MOUTH DAILY 05/27/19  Yes Kendell Bane, NP  Lancets 33G MISC by Does not apply route. Use as directed 4 times a daily diag E11.65   Yes [provider]  lidocaine (XYLOCAINE) 2 % solution Apply small amount to affected areas three to four times daily prn pain. 04/16/19  Yes Boscia, Greer Ee, NP  loratadine (CLARITIN) 10 MG  tablet TAKE 1 TABLET BY MOUTH DAILY 06/23/19  Yes Scarboro, Audie Clear, NP  Melatonin 10 MG TABS Take 40 mg by mouth at bedtime.   Yes [provider]  metoprolol succinate (TOPROL-XL) 25 MG 24 hr tablet TAKE 1 TABLET BY MOUTH DAILY 05/27/19  Yes Scarboro, Audie Clear, NP  Multiple Vitamins-Minerals (MULTIVITAMIN WITH MINERALS) tablet Take 1 tablet by mouth daily.   Yes [provider]  mupirocin ointment (BACTROBAN) 2 % Apply small amount to affected area BID 12/30/18  Yes Boscia, Heather E, NP  nitroGLYCERIN (NITROLINGUAL) 0.4 MG/SPRAY spray PLACE 1 SPRAY UNDER THE TONGUE EVERY 5 MINUTES FOR 3 DOSES AS NEEDED FOR CHEST PAIN. 06/19/18  Yes Ronnell Freshwater, NP  nystatin [facility]) powder APPLY TOPICALLY 6 TIMES DAILY 03/01/19  Yes Boscia, Heather E, NP  omeprazole (PRILOSEC) 40 MG capsule TAKE  1 CAPSULE BY MOUTH DAILY 05/27/19  Yes Kendell Bane, NP  OXYGEN Inhale into the lungs. Inhale 4 liters into lungs continously   Yes [provider]  Phendimetrazine Tartrate 35 MG TABS Take 1 tablet po bid prior to meals. 04/21/18  Yes Scarboro, Audie Clear, NP  silver sulfADIAZINE (SILVADENE) 1 % cream APPLY 1 APPLICATION TOPICALLY DAILY 02/15/19  Yes Boscia, Heather E, NP  simvastatin (ZOCOR) 80 MG tablet TAKE 1 TABLET BY MOUTH DAILY AT 6 PM 05/27/19  Yes Scarboro, Audie Clear, NP  traMADol (ULTRAM) 50 MG tablet TAKE 1 TABLET BY MOUTH EVERY 12 HOURS ASNEEDED FOR MODERATE PAIN 06/29/19  Yes Kendell Bane, NP  VICTOZA 18 MG/3ML SOPN INJECT 1.8 MG EVERY MORNING 05/27/19  Yes Scarboro, Audie Clear, NP      VITAL SIGNS:  Blood pressure (!) 118/55, pulse 84, temperature 98.3 F (36.8 C), temperature source Oral, resp. rate 16, height _0  (1.702 m), weight (!) 163.7 kg, SpO2 100 %.  PHYSICAL EXAMINATION:  Physical Exam  GENERAL:  64 y.o.-year-old patient lying in the bed with no acute distress.  EYES: Pupils equal, round, reactive to light and accommodation. No scleral icterus. Extraocular muscles intact.    HEENT: Head atraumatic, normocephalic. Oropharynx and nasopharynx clear.  NECK:  Supple, no jugular venous distention. No thyroid enlargement, no tenderness.  LUNGS: Normal breath sounds bilaterally, no wheezing, rales,rhonchi or crepitation. No use of accessory muscles of respiration.  CARDIOVASCULAR: Regular rate and rhythm, S1, S2 normal. No murmurs, rubs, or gallops.  ABDOMEN: Soft, nondistended, nontender. Bowel sounds present. No organomegaly or mass.  EXTREMITIES: No pedal edema, cyanosis, or clubbing.  NEUROLOGIC: Cranial nerves II through XII are intact. Muscle strength 5/5 in all extremities. Sensation intact. Gait not checked.  PSYCHIATRIC: The patient is alert and oriented x 3.  Normal affect and good eye contact. SKIN: Left leg discoloration and ecchymosis and right leg ecchymosis LABORATORY PANEL:   CBC Recent Labs  Lab 07/04/19 0150  WBC 7.9  HGB 8.4*  HCT 25.8*  PLT 139*   ------------------------------------------------------------------------------------------------------------------  Chemistries  Recent Labs  Lab 07/03/19 2307 07/03/19 2307 07/04/19 0150  NA 137  --   --   K 3.8  --   --   CL 104  --   --   CO2 26  --   --   GLUCOSE 172*  --   --   BUN 32*  --   --   CREATININE 1.66*   < > 1.69*  CALCIUM 8.1*  --   --   AST 15  --   --   ALT 15  --   --   ALKPHOS 69  --   --   BILITOT 0.4  --   --    < > = values in this interval not displayed.   ------------------------------------------------------------------------------------------------------------------  Cardiac Enzymes No results for input(s): TROPONINI in the last 168 hours. ------------------------------------------------------------------------------------------------------------------  RADIOLOGY:  DG Chest Portable 1 View  Result Date: 07/03/2019 CLINICAL DATA:  Shortness of breath. EXAM: PORTABLE CHEST 1 VIEW COMPARISON:  None. FINDINGS: Mild, chronic appearing increased lung markings  are seen without evidence of acute infiltrate, pleural effusion or pneumothorax. The heart size and mediastinal contours are within normal limits. Multilevel degenerative changes seen throughout the thoracic spine. IMPRESSION: No active disease. Electronically Signed   By: Virgina Norfolk M.D.   On: 07/03/2019 23:08      IMPRESSION AND PLAN:   1.  Chest pain, rule  out acute coronary syndrome. -The patient will be admitted to an observation progressive cardiac unit bed. -We will follow serial troponin I's. -The patient will be placed on aspirin as well as as needed sublingual nitroglycerin and morphine sulfate for pain. -Cardiology consultation will be obtained in a.m. -I notified Dr. Humphrey Rolls about the patient.  2.  Hypertension. -We will continue Toprol-XL and Imdur.  3.  Type 2 diabetes mellitus. -The patient will be placed on supplemental coverage with NovoLog and will continue basal coverage.  4.  Dyslipidemia. -Statin therapy will be resumed.  5.  Gout. -We will continue allopurinol.  6.  DVT prophylaxis. -Subcutaneous Lovenox.  All the records are reviewed and case discussed with ED provider. The plan of care was discussed in details with the patient (and family). I answered all questions. The patient agreed to proceed with the above mentioned plan. Further management will depend upon hospital course.   CODE STATUS: Full code  Status is: Observation  The patient remains OBS appropriate and will d/c before 2 midnights.  Dispo: The patient is from: Home              Anticipated d/c is to: Home              Anticipated d/c date is: 1 day              Patient currently is not medically stable to d/c.   TOTAL TIME TAKING CARE OF THIS PATIENT: 55 minutes.    Christel Mormon M.D on 07/04/2019 at 2:23 AM  [facility]   From 7 PM-7 AM, contact night-coverage [email or web address]  CC: Primary care physician; Lavera Guise, MD   Note: This dictation was prepared with  Dragon dictation along with smaller phrase technology. Any transcriptional errors that result from this process are unintentional.

## 2019-07-06 ENCOUNTER — Ambulatory Visit: Payer: Medicaid Other | Admitting: Adult Health

## 2019-07-08 ENCOUNTER — Telehealth: Payer: Self-pay

## 2019-07-08 NOTE — Telephone Encounter (Signed)
Faxed and mailed forms back to dept of social services for [employer] services. [phone] Mailed.

## 2019-07-08 NOTE — Progress Notes (Signed)
Mailed and scanned form

## 2019-07-12 ENCOUNTER — Telehealth: Payer: Self-pay

## 2019-07-12 ENCOUNTER — Other Ambulatory Visit: Payer: Self-pay

## 2019-07-12 NOTE — Telephone Encounter (Signed)
Confirmed appointment on 07/14/2019 and screened for covid. klh 

## 2019-07-13 ENCOUNTER — Other Ambulatory Visit (HOSPITAL_COMMUNITY)
Admission: RE | Admit: 2019-07-13 | Discharge: 2019-07-13 | Disposition: A | Discharge status: Home / Self Care | Payer: Medicare Other | Source: Ambulatory Visit | Attending: Certified Nurse Midwife | Admitting: Certified Nurse Midwife

## 2019-07-13 ENCOUNTER — Encounter: Payer: Medicare Other | Admitting: Certified Nurse Midwife

## 2019-07-13 ENCOUNTER — Encounter: Payer: Self-pay | Admitting: Certified Nurse Midwife

## 2019-07-13 ENCOUNTER — Ambulatory Visit (INDEPENDENT_AMBULATORY_CARE_PROVIDER_SITE_OTHER): Payer: Medicare Other | Admitting: Certified Nurse Midwife

## 2019-07-13 ENCOUNTER — Other Ambulatory Visit: Payer: Self-pay

## 2019-07-13 VITALS — BP 133/58 | HR 82 | Ht 67.0 in | Wt 361.0 lb

## 2019-07-13 DIAGNOSIS — Z113 Encounter for screening for infections with a predominantly sexual mode of transmission: Secondary | ICD-10-CM | POA: Insufficient documentation

## 2019-07-13 DIAGNOSIS — R102 Pelvic and perineal pain: Secondary | ICD-10-CM | POA: Diagnosis present
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
## 2019-07-13 NOTE — Patient Instructions (Signed)
Vaginal Yeast Infection, Adult  Vaginal yeast infection is a condition that causes vaginal discharge as well as soreness, swelling, and redness (inflammation) of the vagina. This is a common condition. Some women get this infection frequently. What are the causes? This condition is caused by a change in the normal balance of the yeast (candida) and bacteria that live in the vagina. This change causes an overgrowth of yeast, which causes the inflammation. What increases the risk? The condition is more likely to develop in women who:  Take antibiotic medicines.  Have diabetes.  Take birth control pills.  Are pregnant.  Douche often.  Have a weak body defense system (immune system).  Have been taking steroid medicines for a long time.  Frequently wear tight clothing. What are the signs or symptoms? Symptoms of this condition include:  White, thick, creamy vaginal discharge.  Swelling, itching, redness, and irritation of the vagina. The lips of the vagina (vulva) may be affected as well.  Pain or a burning feeling while urinating.  Pain during sex. How is this diagnosed? This condition is diagnosed based on:  Your medical history.  A physical exam.  A pelvic exam. Your health care provider will examine a sample of your vaginal discharge under a microscope. Your health care provider may send this sample for testing to confirm the diagnosis. How is this treated? This condition is treated with medicine. Medicines may be over-the-counter or prescription. You may be told to use one or more of the following:  Medicine that is taken by mouth (orally).  Medicine that is applied as a cream (topically).  Medicine that is inserted directly into the vagina (suppository). Follow these instructions at home:  Lifestyle  Do not have sex until your health care provider approves. Tell your sex partner that you have a yeast infection. That person should go to his or her health care  provider and ask if they should also be treated.  Do not wear tight clothes, such as pantyhose or tight pants.  Wear breathable cotton underwear. General instructions  Take or apply over-the-counter and prescription medicines only as told by your health care provider.  Eat more yogurt. This may help to keep your yeast infection from returning.  Do not use tampons until your health care provider approves.  Try taking a sitz bath to help with discomfort. This is a warm water bath that is taken while you are sitting down. The water should only come up to your hips and should cover your buttocks. Do this 3-4 times per day or as told by your health care provider.  Do not douche.  If you have diabetes, keep your blood sugar levels under control.  Keep all follow-up visits as told by your health care provider. This is important. Contact a health care provider if:  You have a fever.  Your symptoms go away and then return.  Your symptoms do not get better with treatment.  Your symptoms get worse.  You have new symptoms.  You develop blisters in or around your vagina.  You have blood coming from your vagina and it is not your menstrual period.  You develop pain in your abdomen. Summary  Vaginal yeast infection is a condition that causes discharge as well as soreness, swelling, and redness (inflammation) of the vagina.  This condition is treated with medicine. Medicines may be over-the-counter or prescription.  Take or apply over-the-counter and prescription medicines only as told by your health care provider.  Do not douche.   Do not have sex or use tampons until your health care provider approves.  Contact a health care provider if your symptoms do not get better with treatment or your symptoms go away and then return. This information is not intended to replace advice given to you by your health care provider. Make sure you discuss any questions you have with your health care  provider. Document Revised: 09/11/2018 Document Reviewed: 06/30/2017 Elsevier Patient Education  2020 [employer]  

## 2019-07-14 ENCOUNTER — Encounter: Payer: Self-pay | Admitting: Adult Health

## 2019-07-14 ENCOUNTER — Ambulatory Visit (INDEPENDENT_AMBULATORY_CARE_PROVIDER_SITE_OTHER): Payer: Medicare Other | Admitting: Adult Health

## 2019-07-14 VITALS — BP 151/64 | HR 87 | Temp 97.3°F | Resp 16 | Ht 67.0 in | Wt 361.0 lb

## 2019-07-14 DIAGNOSIS — E1165 Type 2 diabetes mellitus with hyperglycemia: Secondary | ICD-10-CM | POA: Diagnosis not present

## 2019-07-14 DIAGNOSIS — E119 Type 2 diabetes mellitus without complications: Secondary | ICD-10-CM | POA: Diagnosis not present

## 2019-07-14 DIAGNOSIS — S61218D Laceration without foreign body of other finger without damage to nail, subsequent encounter: Secondary | ICD-10-CM

## 2019-07-14 DIAGNOSIS — Z794 Long term (current) use of insulin: Secondary | ICD-10-CM

## 2019-07-14 DIAGNOSIS — E7849 Other hyperlipidemia: Secondary | ICD-10-CM | POA: Diagnosis not present

## 2019-07-14 LAB — POCT GLYCOSYLATED HEMOGLOBIN (HGB A1C): Hemoglobin A1C: 8.6 % — AB (ref 4.0–5.6)

## 2019-07-14 LAB — HSV 1 AND 2 IGM ABS, INDIRECT
HSV 1 IgM: <1:10 {titer}
HSV 2 IgM: <1:10 {titer}

## 2019-07-14 LAB — RPR: RPR Ser Ql: NONREACTIVE

## 2019-07-14 LAB — HEPATITIS B SURFACE ANTIGEN: Hepatitis B Surface Ag: NEGATIVE

## 2019-07-14 MED ORDER — SULFAMETHOXAZOLE-TRIMETHOPRIM 400-80 MG PO TABS: 1.0000 | ORAL_TABLET | Freq: Two times a day (BID) | ORAL | 0 refills | Status: DC

## 2019-07-14 NOTE — Progress Notes (Signed)
[facility] [place], [place] [address]  Internal MEDICINE  Office Visit Note  Patient Name: Deanna Rice  [id number]  [id number]  Date of Service: 07/14/2019  Chief Complaint  Patient presents with  . Follow-up  . Depression  . Diabetes  . Hyperlipidemia  . Hypertension    HPI PT is here for follow up on DM, Depression, HTN, and HLD. Overall she is doing well.  Her bp today is 151/64.  Denies Chest pain, Shortness of breath, palpitations, headache, or blurred vision. Her blood sugars remain elevated at times.  She reports she is taking her medications. Her A1C today is 8.6.  Her depression is well controlled at this time.     Current Medication: Outpatient Encounter Medications as of 07/14/2019  Medication Sig  . ADVAIR DISKUS 250-50 MCG/DOSE AEPB INHALE 1 PUFF BY MOUTH INTO THE LUNGS 2 TIMES A DAY  . allopurinol (ZYLOPRIM) 100 MG tablet TAKE 1 TABLET BY MOUTH DAILY  . amitriptyline (ELAVIL) 25 MG tablet TAKE 2 TABLETS BY MOUTH AT BEDTIME  . Blood Glucose Monitoring Suppl (CONTOUR NEXT MONITOR) w/Device KIT by Does not apply route.  . clopidogrel (PLAVIX) 75 MG tablet TAKE 1 TABLET BY MOUTH DAILY  . COMBIVENT RESPIMAT 20-100 MCG/ACT AERS respimat INHALE 1 PUFF BY MOUTH INTO THE LUNGS EVERY 6 HOURS.  Marland Kitchen diclofenac sodium (VOLTAREN) 1 % GEL Apply 2 g topically 3 (three) times daily.  Marland Kitchen docusate sodium (COLACE) 100 MG capsule Take 50 mg by mouth daily as needed. Reported on 06/28/2015  . DULoxetine (CYMBALTA) 60 MG capsule TAKE ONE CAPSULE BY MOUTH TWICE A DAY  . fluticasone (FLONASE) 50 MCG/ACT nasal spray PLACE 2 SPRAYS INTO BOTH NOSTRILS DAILY  . folic acid (FOLVITE) 1 MG tablet TAKE 1 TABLET BY MOUTH DAILY  . glucose blood (ACCU-CHEK AVIVA PLUS) test strip Use as instructed  . glucose blood (CONTOUR NEXT TEST) test strip Use as instructed 4 times a daily Diag E11.65  . insulin aspart (NOVOLOG FLEXPEN) 100 UNIT/ML FlexPen Use as directed with sliding  scale maximum 63 units no more  . Insulin Glargine (BASAGLAR KWIKPEN) 100 UNIT/ML INJECT 50 UNITS SUBCUTANEOUSLY EVERY DAYAND 18 TO 24 UNITS AT BEDTIME  . Insulin Pen Needle (GLOBAL EASE INJECT PEN NEEDLES) 32G X 4 MM MISC Use  As directed  . isosorbide mononitrate (IMDUR) 30 MG 24 hr tablet TAKE 1 TABLET BY MOUTH DAILY  . Lancets 33G MISC by Does not apply route. Use as directed 4 times a daily diag E11.65  . lidocaine (XYLOCAINE) 2 % solution Apply small amount to affected areas three to four times daily prn pain.  Marland Kitchen loratadine (CLARITIN) 10 MG tablet TAKE 1 TABLET BY MOUTH DAILY  . Melatonin 10 MG TABS Take 40 mg by mouth at bedtime.  . metoprolol succinate (TOPROL-XL) 25 MG 24 hr tablet TAKE 1 TABLET BY MOUTH DAILY  . Multiple Vitamins-Minerals (MULTIVITAMIN WITH MINERALS) tablet Take 1 tablet by mouth daily.  . nitroGLYCERIN (NITROLINGUAL) 0.4 MG/SPRAY spray PLACE 1 SPRAY UNDER THE TONGUE EVERY 5 MINUTES FOR 3 DOSES AS NEEDED FOR CHEST PAIN.  Marland Kitchen nystatin (NYAMYC) powder APPLY TOPICALLY 6 TIMES DAILY  . omeprazole (PRILOSEC) 40 MG capsule TAKE 1 CAPSULE BY MOUTH DAILY  . OXYGEN Inhale into the lungs. Inhale 4 liters into lungs continously  . silver sulfADIAZINE (SILVADENE) 1 % cream APPLY 1 APPLICATION TOPICALLY DAILY  . simvastatin (ZOCOR) 80 MG tablet Take 80 mg by mouth daily.  . traMADol (  ULTRAM) 50 MG tablet TAKE 1 TABLET BY MOUTH EVERY 12 HOURS ASNEEDED FOR MODERATE PAIN  . VICTOZA 18 MG/3ML SOPN INJECT 1.8 MG EVERY MORNING  . [DISCONTINUED] mupirocin ointment (BACTROBAN) 2 % Apply small amount to affected area BID  . [DISCONTINUED] Phendimetrazine Tartrate 35 MG TABS Take 1 tablet po bid prior to meals.  Marland Kitchen aspirin EC 81 MG tablet Take 81 mg by mouth daily.  . [DISCONTINUED] atorvastatin (LIPITOR) 40 MG tablet Take 1 tablet (40 mg total) by mouth daily. (Patient not taking: Reported on 07/14/2019)   No facility-administered encounter medications on file as of 07/14/2019.    Surgical  History: Past Surgical History:  Procedure Laterality Date  . TONSILLECTOMY      Medical History: Past Medical History:  Diagnosis Date  . Anemia, unspecified   . CHF (congestive heart failure) ([place])   . Chronic ischemic heart disease, unspecified   . COPD (chronic obstructive pulmonary disease) ([place])   . Dependence on supplemental oxygen   . Diabetes mellitus without complication ([place])   . Gout, unspecified   . Hypertension   . Mixed hyperlipidemia   . Obstructive chronic bronchitis without exacerbation ([place])   . Obstructive sleep apnea syndrome   . Peripheral venous insufficiency   . Primary generalized hypertrophic osteoarthrosis   . Primary hypothyroidism   . Severe recurrent major depression with psychotic features ([place])     Family History: Family History  Problem Relation Age of Onset  . Hypertension Mother   . Diabetes Mother   . CAD Mother   . CAD Father   . Diabetes Father   . Hypertension Father   . Hypertension Sister   . Diabetes Sister   . CAD Sister     Social History   Socioeconomic History  . Marital status: Single    Spouse name: Not on file  . Number of children: Not on file  . Years of education: Not on file  . Highest education level: Not on file  Occupational History  . Not on file  Tobacco Use  . Smoking status: Former Research scientist (life sciences)  . Smokeless tobacco: Never Used  Substance and Sexual Activity  . Alcohol use: No  . Drug use: No  . Sexual activity: Not Currently    Birth control/protection: Post-menopausal  Other Topics Concern  . Not on file  Social History Narrative  . Not on file   Social Determinants of Health   Financial Resource Strain:   . Difficulty of Paying Living Expenses:   Food Insecurity:   . Worried About Charity fundraiser in the Last Year:   . Arboriculturist in the Last Year:   Transportation Needs:   . Film/video editor (Medical):   Marland Kitchen Lack of Transportation (Non-Medical):   Physical Activity:   . Days  of Exercise per Week:   . Minutes of Exercise per Session:   Stress:   . Feeling of Stress :   Social Connections:   . Frequency of Communication with Friends and Family:   . Frequency of Social Gatherings with Friends and Family:   . Attends Religious Services:   . Active Member of Clubs or Organizations:   . Attends Archivist Meetings:   Marland Kitchen Marital Status:   Intimate Partner Violence:   . Fear of Current or Ex-Partner:   . Emotionally Abused:   Marland Kitchen Physically Abused:   . Sexually Abused:       Review of Systems  Constitutional: Negative for  chills, fatigue and unexpected weight change.  HENT: Negative for congestion, rhinorrhea, sneezing and sore throat.   Eyes: Negative for photophobia, pain and redness.  Respiratory: Negative for cough, chest tightness and shortness of breath.   Cardiovascular: Negative for chest pain and palpitations.  Gastrointestinal: Negative for abdominal pain, constipation, diarrhea, nausea and vomiting.  Endocrine: Negative.   Genitourinary: Negative for dysuria and frequency.  Musculoskeletal: Negative for arthralgias, back pain, joint swelling and neck pain.  Skin: Negative for rash.  Allergic/Immunologic: Negative.   Neurological: Negative for tremors and numbness.  Hematological: Negative for adenopathy. Does not bruise/bleed easily.  Psychiatric/Behavioral: Negative for behavioral problems and sleep disturbance. The patient is not nervous/anxious.     Vital Signs: BP (!) 151/64   Pulse 87   Temp (!) 97.3 F (36.3 C)   Resp 16   Ht '5\' 7"'  (1.702 m)   Wt (!) 361 lb (163.7 kg)   SpO2 94%   BMI 56.54 kg/m    Physical Exam Vitals and nursing note reviewed.  Constitutional:      General: She is not in acute distress.    Appearance: She is well-developed. She is not diaphoretic.  HENT:     Head: Normocephalic and atraumatic.     Mouth/Throat:     Pharynx: No oropharyngeal exudate.  Eyes:     Pupils: Pupils are equal, round,  and reactive to light.  Neck:     Thyroid: No thyromegaly.     Vascular: No JVD.     Trachea: No tracheal deviation.  Cardiovascular:     Rate and Rhythm: Normal rate and regular rhythm.     Heart sounds: Normal heart sounds. No murmur. No friction rub. No gallop.   Pulmonary:     Effort: Pulmonary effort is normal. No respiratory distress.     Breath sounds: Normal breath sounds. No wheezing or rales.  Chest:     Chest wall: No tenderness.  Abdominal:     Palpations: Abdomen is soft.     Tenderness: There is no abdominal tenderness. There is no guarding.  Musculoskeletal:        General: Normal range of motion.     Cervical back: Normal range of motion and neck supple.  Lymphadenopathy:     Cervical: No cervical adenopathy.  Skin:    General: Skin is warm and dry.  Neurological:     Mental Status: She is alert and oriented to person, place, and time.     Cranial Nerves: No cranial nerve deficit.  Psychiatric:        Behavior: Behavior normal.        Thought Content: Thought content normal.        Judgment: Judgment normal.     Assessment/Plan: 1. Uncontrolled type 2 diabetes mellitus with hyperglycemia (HCC) A1C is 8.6, encouraged patient to continue to monitor diet and take medications as directed.  - POCT HgB A1C  2. Diabetes mellitus without complication (HCC) Continue current medications.  3. Encounter for long-term (current) use of insulin ([place]) Continue meds as before.   4. Laceration of index finger without foreign body without damage to nail, unspecified laterality, subsequent encounter Take Bactrim as directed. -Bactrim DS  5. Other hyperlipidemia Continue zocor - simvastatin (ZOCOR) 80 MG tablet; Take 80 mg by mouth daily.  General Counseling: Deanna Rice understanding of the findings of todays visit and agrees with plan of treatment. I have discussed any further diagnostic evaluation that may be needed or ordered today. We  also reviewed her  medications today. she has been encouraged to call the office with any questions or concerns that should arise related to todays visit.    Orders Placed This Encounter  Procedures  . POCT HgB A1C    No orders of the defined types were placed in this encounter.   Time spent: 30 Minutes   This patient was seen by Orson Gear AGNP-C in Collaboration with Dr Lavera Guise as a part of collaborative care agreement     Kendell Bane AGNP-C Internal medicine

## 2019-07-15 ENCOUNTER — Telehealth: Payer: Self-pay

## 2019-07-15 LAB — CERVICOVAGINAL ANCILLARY ONLY
Bacterial Vaginitis (gardnerella): NEGATIVE
Candida Glabrata: NEGATIVE
Candida Vaginitis: NEGATIVE
Chlamydia: NEGATIVE
Comment: NEGATIVE
Comment: NEGATIVE
Comment: NEGATIVE
Comment: NEGATIVE
Comment: NEGATIVE
Comment: NORMAL
Neisseria Gonorrhea: NEGATIVE
Trichomonas: NEGATIVE

## 2019-07-15 NOTE — Telephone Encounter (Signed)
Patient informed of normal test results per Serafina Royals CNM.

## 2019-07-16 ENCOUNTER — Telehealth: Payer: Self-pay

## 2019-07-16 NOTE — Telephone Encounter (Signed)
Confirmed appointment on 07/20/2019. klh 

## 2019-07-20 ENCOUNTER — Encounter: Payer: Self-pay | Admitting: Adult Health

## 2019-07-20 ENCOUNTER — Ambulatory Visit (INDEPENDENT_AMBULATORY_CARE_PROVIDER_SITE_OTHER): Payer: Medicare Other | Admitting: Adult Health

## 2019-07-20 ENCOUNTER — Other Ambulatory Visit: Payer: Self-pay

## 2019-07-20 VITALS — BP 129/61 | HR 85 | Temp 97.3°F | Resp 16 | Ht 67.0 in | Wt 361.0 lb

## 2019-07-20 DIAGNOSIS — Z7689 Persons encountering health services in other specified circumstances: Secondary | ICD-10-CM

## 2019-07-20 DIAGNOSIS — I10 Essential (primary) hypertension: Secondary | ICD-10-CM

## 2019-07-20 DIAGNOSIS — L03116 Cellulitis of left lower limb: Secondary | ICD-10-CM | POA: Diagnosis not present

## 2019-07-20 MED ORDER — SULFAMETHOXAZOLE-TRIMETHOPRIM 400-80 MG PO TABS: 1.0000 | ORAL_TABLET | Freq: Two times a day (BID) | ORAL | 0 refills | Status: DC

## 2019-07-20 NOTE — Progress Notes (Addendum)
[facility] [place], [place] [address]  Internal MEDICINE  Office Visit Note  Patient Name: Deanna Rice  [id number]  [id number]  Date of Service: 07/20/2019  Chief Complaint  Patient presents with  . Follow-up    paper work for power wheelchair  . Depression  . Diabetes  . Hypertension  . Hyperlipidemia    HPI  Pt is seen for face to face mobility assessment for power wheelchair.  Her previous power chair has stopped working and can not be fixed.  She is essentially stranded and must rely on other people for total care of her ADL's.  She was using an in home power chair that she requires in order facilitate her with her ADLS including; cooking, cleaning, showering/toileting. The patient has Bilateral lower extremity  Weakness 2/5 on evaluation, therefore she can not use a cane or walker.  She also has upper extremity weakness 1/5 on right and left side.  Her grip strength is 2/5 bilaterally.  We discussed using a Scooter(pov) which she can not use due to lack of postural stability. A manual wheelchair is also ruled out because her upper extremity weakness down not allow her to propel herself. She has been safely using a power chair for in and around her home for many years, and I continue to feel this is the best option for her.  She certainly has the mental capacity to continue to operate a power wheelchair safely.  Due to her chair and her morbid obesity, and impaired mobility she uses incontinent supplies which she needs a new RX for.  She does complain today of cellulitis around the wound of her left lower extremity.      Current Medication: Outpatient Encounter Medications as of 07/20/2019  Medication Sig  . ADVAIR DISKUS 250-50 MCG/DOSE AEPB INHALE 1 PUFF BY MOUTH INTO THE LUNGS 2 TIMES A DAY  . allopurinol (ZYLOPRIM) 100 MG tablet TAKE 1 TABLET BY MOUTH DAILY  . amitriptyline (ELAVIL) 25 MG tablet TAKE 2 TABLETS BY MOUTH AT BEDTIME  . aspirin EC 81 MG  tablet Take 81 mg by mouth daily.  . Blood Glucose Monitoring Suppl (CONTOUR NEXT MONITOR) w/Device KIT by Does not apply route.  . clopidogrel (PLAVIX) 75 MG tablet TAKE 1 TABLET BY MOUTH DAILY  . COMBIVENT RESPIMAT 20-100 MCG/ACT AERS respimat INHALE 1 PUFF BY MOUTH INTO THE LUNGS EVERY 6 HOURS.  Marland Kitchen diclofenac sodium (VOLTAREN) 1 % GEL Apply 2 g topically 3 (three) times daily.  Marland Kitchen docusate sodium (COLACE) 100 MG capsule Take 50 mg by mouth daily as needed. Reported on 06/28/2015  . DULoxetine (CYMBALTA) 60 MG capsule TAKE ONE CAPSULE BY MOUTH TWICE A DAY  . fluticasone (FLONASE) 50 MCG/ACT nasal spray PLACE 2 SPRAYS INTO BOTH NOSTRILS DAILY  . folic acid (FOLVITE) 1 MG tablet TAKE 1 TABLET BY MOUTH DAILY  . glucose blood (ACCU-CHEK AVIVA PLUS) test strip Use as instructed  . glucose blood (CONTOUR NEXT TEST) test strip Use as instructed 4 times a daily Diag E11.65  . insulin aspart (NOVOLOG FLEXPEN) 100 UNIT/ML FlexPen Use as directed with sliding scale maximum 63 units no more  . Insulin Glargine (BASAGLAR KWIKPEN) 100 UNIT/ML INJECT 50 UNITS SUBCUTANEOUSLY EVERY DAYAND 18 TO 24 UNITS AT BEDTIME  . Insulin Pen Needle (GLOBAL EASE INJECT PEN NEEDLES) 32G X 4 MM MISC Use  As directed  . isosorbide mononitrate (IMDUR) 30 MG 24 hr tablet TAKE 1 TABLET BY MOUTH DAILY  .  Lancets 33G MISC by Does not apply route. Use as directed 4 times a daily diag E11.65  . lidocaine (XYLOCAINE) 2 % solution Apply small amount to affected areas three to four times daily prn pain.  Marland Kitchen loratadine (CLARITIN) 10 MG tablet TAKE 1 TABLET BY MOUTH DAILY  . Melatonin 10 MG TABS Take 40 mg by mouth at bedtime.  . metoprolol succinate (TOPROL-XL) 25 MG 24 hr tablet TAKE 1 TABLET BY MOUTH DAILY  . Multiple Vitamins-Minerals (MULTIVITAMIN WITH MINERALS) tablet Take 1 tablet by mouth daily.  . nitroGLYCERIN (NITROLINGUAL) 0.4 MG/SPRAY spray PLACE 1 SPRAY UNDER THE TONGUE EVERY 5 MINUTES FOR 3 DOSES AS NEEDED FOR CHEST PAIN.  Marland Kitchen  nystatin (NYAMYC) powder APPLY TOPICALLY 6 TIMES DAILY  . omeprazole (PRILOSEC) 40 MG capsule TAKE 1 CAPSULE BY MOUTH DAILY  . OXYGEN Inhale into the lungs. Inhale 4 liters into lungs continously  . silver sulfADIAZINE (SILVADENE) 1 % cream APPLY 1 APPLICATION TOPICALLY DAILY  . simvastatin (ZOCOR) 80 MG tablet Take 80 mg by mouth daily.  Marland Kitchen sulfamethoxazole-trimethoprim (BACTRIM) 400-80 MG tablet Take 1 tablet by mouth 2 (two) times daily.  . traMADol (ULTRAM) 50 MG tablet TAKE 1 TABLET BY MOUTH EVERY 12 HOURS ASNEEDED FOR MODERATE PAIN  . VICTOZA 18 MG/3ML SOPN INJECT 1.8 MG EVERY MORNING  . [DISCONTINUED] sulfamethoxazole-trimethoprim (BACTRIM) 400-80 MG tablet Take 1 tablet by mouth 2 (two) times daily.   No facility-administered encounter medications on file as of 07/20/2019.    Surgical History: Past Surgical History:  Procedure Laterality Date  . TONSILLECTOMY      Medical History: Past Medical History:  Diagnosis Date  . Anemia, unspecified   . CHF (congestive heart failure) ([place])   . Chronic ischemic heart disease, unspecified   . COPD (chronic obstructive pulmonary disease) ([place])   . Dependence on supplemental oxygen   . Diabetes mellitus without complication ([place])   . Gout, unspecified   . Hypertension   . Mixed hyperlipidemia   . Obstructive chronic bronchitis without exacerbation ([place])   . Obstructive sleep apnea syndrome   . Peripheral venous insufficiency   . Primary generalized hypertrophic osteoarthrosis   . Primary hypothyroidism   . Severe recurrent major depression with psychotic features ([place])     Family History: Family History  Problem Relation Age of Onset  . Hypertension Mother   . Diabetes Mother   . CAD Mother   . CAD Father   . Diabetes Father   . Hypertension Father   . Hypertension Sister   . Diabetes Sister   . CAD Sister     Social History   Socioeconomic History  . Marital status: Single    Spouse name: Not on file  . Number of  children: Not on file  . Years of education: Not on file  . Highest education level: Not on file  Occupational History  . Not on file  Tobacco Use  . Smoking status: Former Research scientist (life sciences)  . Smokeless tobacco: Never Used  Substance and Sexual Activity  . Alcohol use: No  . Drug use: No  . Sexual activity: Not Currently    Birth control/protection: Post-menopausal  Other Topics Concern  . Not on file  Social History Narrative  . Not on file   Social Determinants of Health   Financial Resource Strain:   . Difficulty of Paying Living Expenses:   Food Insecurity:   . Worried About Charity fundraiser in the Last Year:   . [employer]  of Food in the Last Year:   Transportation Needs:   . Film/video editor (Medical):   Marland Kitchen Lack of Transportation (Non-Medical):   Physical Activity:   . Days of Exercise per Week:   . Minutes of Exercise per Session:   Stress:   . Feeling of Stress :   Social Connections:   . Frequency of Communication with Friends and Family:   . Frequency of Social Gatherings with Friends and Family:   . Attends Religious Services:   . Active Member of Clubs or Organizations:   . Attends Archivist Meetings:   Marland Kitchen Marital Status:   Intimate Partner Violence:   . Fear of Current or Ex-Partner:   . Emotionally Abused:   Marland Kitchen Physically Abused:   . Sexually Abused:       Review of Systems  Constitutional: Negative for chills, fatigue and unexpected weight change.  HENT: Negative for congestion, rhinorrhea, sneezing and sore throat.   Eyes: Negative for photophobia, pain and redness.  Respiratory: Negative for cough, chest tightness and shortness of breath.   Cardiovascular: Negative for chest pain and palpitations.  Gastrointestinal: Negative for abdominal pain, constipation, diarrhea, nausea and vomiting.  Endocrine: Negative.   Genitourinary: Negative for dysuria and frequency.  Musculoskeletal: Negative for arthralgias, back pain, joint swelling and  neck pain.  Skin: Negative for rash.  Allergic/Immunologic: Negative.   Neurological: Negative for tremors and numbness.  Hematological: Negative for adenopathy. Does not bruise/bleed easily.  Psychiatric/Behavioral: Negative for behavioral problems and sleep disturbance. The patient is not nervous/anxious.     Vital Signs: BP 129/61   Pulse 85   Temp (!) 97.3 F (36.3 C)   Resp 16   Ht 5' 7" (1.702 m)   Wt (!) 361 lb (163.7 kg)   SpO2 95%   BMI 56.54 kg/m    Physical Exam Vitals and nursing note reviewed.  Constitutional:      General: She is not in acute distress.    Appearance: She is well-developed. She is not diaphoretic.  HENT:     Head: Normocephalic and atraumatic.     Mouth/Throat:     Pharynx: No oropharyngeal exudate.  Eyes:     Pupils: Pupils are equal, round, and reactive to light.  Neck:     Thyroid: No thyromegaly.     Vascular: No JVD.     Trachea: No tracheal deviation.  Cardiovascular:     Rate and Rhythm: Normal rate and regular rhythm.     Heart sounds: Normal heart sounds. No murmur. No friction rub. No gallop.   Pulmonary:     Effort: Pulmonary effort is normal. No respiratory distress.     Breath sounds: Normal breath sounds. No wheezing or rales.  Chest:     Chest wall: No tenderness.  Abdominal:     Palpations: Abdomen is soft.     Tenderness: There is no abdominal tenderness. There is no guarding.  Musculoskeletal:        General: Normal range of motion.     Cervical back: Normal range of motion and neck supple.  Lymphadenopathy:     Cervical: No cervical adenopathy.  Skin:    General: Skin is warm and dry.  Neurological:     Mental Status: She is alert and oriented to person, place, and time.     Cranial Nerves: No cranial nerve deficit.  Psychiatric:        Behavior: Behavior normal.        Thought  Content: Thought content normal.        Judgment: Judgment normal.     Assessment/Plan: 1. Encounter for power mobility device  assessment Pt needs power wheelchair.   2. Cellulitis of left lower extremity without foot Advised patient to take entire course of antibiotics as prescribed with food. Pt should return to clinic in 7-10 days if symptoms fail to improve or new symptoms develop.  - sulfamethoxazole-trimethoprim (BACTRIM) 400-80 MG tablet; Take 1 tablet by mouth 2 (two) times daily.  Dispense: 14 tablet; Refill: 0  3. Essential hypertension Controlled, continue present management.   General Counseling: dynasty holquin understanding of the findings of todays visit and agrees with plan of treatment. I have discussed any further diagnostic evaluation that may be needed or ordered today. We also reviewed her medications today. she has been encouraged to call the office with any questions or concerns that should arise related to todays visit.    No orders of the defined types were placed in this encounter.   Meds ordered this encounter  Medications  . sulfamethoxazole-trimethoprim (BACTRIM) 400-80 MG tablet    Sig: Take 1 tablet by mouth 2 (two) times daily.    Dispense:  14 tablet    Refill:  0    Time spent: 30 Minutes   This patient was seen by Orson Gear AGNP-C in Collaboration with Dr Lavera Guise as a part of collaborative care agreement     Kendell Bane AGNP-C Internal medicine

## 2019-07-21 ENCOUNTER — Other Ambulatory Visit: Payer: Self-pay

## 2019-07-21 NOTE — Telephone Encounter (Signed)
[facility] with [facility] due to insurance not covered they going to try one touch gave verbal approval

## 2019-07-22 ENCOUNTER — Telehealth: Payer: Self-pay

## 2019-07-22 NOTE — Telephone Encounter (Signed)
Diabetic supply companies needs to be verified with patient in order to make sure she is getting supplies from just one company. Deanna Rice

## 2019-07-27 ENCOUNTER — Other Ambulatory Visit: Payer: Self-pay

## 2019-07-27 ENCOUNTER — Other Ambulatory Visit: Payer: Self-pay | Admitting: Adult Health

## 2019-07-27 DIAGNOSIS — N764 Abscess of vulva: Secondary | ICD-10-CM

## 2019-07-27 MED ORDER — LIDOCAINE VISCOUS HCL 2 % MT SOLN: OROMUCOSAL | 0 refills | Status: DC

## 2019-07-29 ENCOUNTER — Telehealth: Payer: Self-pay

## 2019-07-29 NOTE — Telephone Encounter (Signed)
Faxed face to face to notes for power wheelchair. [phone] faxed [phone]. beth

## 2019-07-29 NOTE — Telephone Encounter (Signed)
Faxed notes to

## 2019-08-05 ENCOUNTER — Telehealth: Payer: Self-pay

## 2019-08-05 NOTE — Telephone Encounter (Signed)
Order for incontinent supplies signed and faxed back to [employer]. Copy placed in scan.

## 2019-08-05 NOTE — Telephone Encounter (Signed)
Advised [facility] we are working on the notes for power wheel chair and once provider is finished he will let beth know to fax the updated notes. [name]

## 2019-08-11 ENCOUNTER — Telehealth: Payer: Self-pay

## 2019-08-11 NOTE — Telephone Encounter (Signed)
Revised FL2 forms signed and completed and ready for pickup. Advised Social Worker Robert L at [employer]. Copy placed in scan.

## 2019-08-18 ENCOUNTER — Telehealth: Payer: Self-pay

## 2019-08-18 NOTE — Telephone Encounter (Signed)
CMN signed for Medicaid approval and faxed to Vernona Rieger at [phone].

## 2019-08-23 ENCOUNTER — Other Ambulatory Visit: Payer: Self-pay | Admitting: Adult Health

## 2019-08-23 DIAGNOSIS — G894 Chronic pain syndrome: Secondary | ICD-10-CM

## 2019-08-23 NOTE — Telephone Encounter (Signed)
Last appt 07/16/19 and next 10/20/19

## 2019-08-26 ENCOUNTER — Other Ambulatory Visit: Payer: Self-pay

## 2019-08-31 ENCOUNTER — Other Ambulatory Visit: Payer: Self-pay

## 2019-08-31 DIAGNOSIS — D696 Thrombocytopenia, unspecified: Secondary | ICD-10-CM

## 2019-08-31 NOTE — Telephone Encounter (Signed)
Faxed [facility] for clarification for med for basaglar as per adam 50 units day and 22 at night and novolog used sliding scale per meals

## 2019-09-01 ENCOUNTER — Other Ambulatory Visit: Payer: Self-pay

## 2019-09-01 DIAGNOSIS — D696 Thrombocytopenia, unspecified: Secondary | ICD-10-CM

## 2019-09-02 ENCOUNTER — Telehealth: Payer: Self-pay

## 2019-09-02 NOTE — Telephone Encounter (Signed)
[facility] called PT had skin tear FYI advised Med [facility] PT needs to seen if it gets worse

## 2019-09-03 ENCOUNTER — Telehealth: Payer: Self-pay

## 2019-09-03 NOTE — Telephone Encounter (Signed)
Authorization was approved for Basaglar 100 Unit/ML from 09/01/19 through 02/25/20 SL

## 2019-09-07 ENCOUNTER — Telehealth: Payer: Self-pay

## 2019-09-07 NOTE — Telephone Encounter (Signed)
CMN signed for power wheelchair and faxed back to [facility].Placed in scan.

## 2019-09-14 ENCOUNTER — Other Ambulatory Visit: Payer: Self-pay

## 2019-09-14 DIAGNOSIS — D696 Thrombocytopenia, unspecified: Secondary | ICD-10-CM

## 2019-09-14 MED ORDER — BASAGLAR KWIKPEN 100 UNIT/ML ~~LOC~~ SOPN: PEN_INJECTOR | SUBCUTANEOUS | 5 refills | Status: DC

## 2019-09-17 ENCOUNTER — Telehealth: Payer: Self-pay

## 2019-09-17 NOTE — Telephone Encounter (Signed)
CMN recert for Medicaid pulse ox signed and faxed back to Vernona Rieger at [phone] placed in scan.

## 2019-09-27 ENCOUNTER — Telehealth: Payer: Self-pay

## 2019-09-27 NOTE — Telephone Encounter (Signed)
Completed medical record request and mailed requested records to [facility]. Copy of request placed in hold at front desk.

## 2019-10-18 ENCOUNTER — Telehealth: Payer: Self-pay

## 2019-10-18 NOTE — Telephone Encounter (Signed)
Advised we have the rong phone number for Deanna Rice. Please update #

## 2019-10-20 ENCOUNTER — Telehealth: Payer: Self-pay

## 2019-10-20 ENCOUNTER — Ambulatory Visit: Payer: Medicare Other | Admitting: Hospice and Palliative Medicine

## 2019-10-20 NOTE — Telephone Encounter (Signed)
lmom to call us back to schedule annual wellness as per dfk ok to do telephone visit

## 2019-10-27 ENCOUNTER — Telehealth: Payer: Self-pay

## 2019-10-27 NOTE — Telephone Encounter (Signed)
Home health plan of care signed and placed in  [facility] folder.

## 2019-12-02 NOTE — Progress Notes (Deleted)
[facility]  [phone] [phone]  ID: Deanna Rice OB: 04-18-1955  MR#: [id number]  QIR#:[id number]  Patient Care Team: Lyndon Code, MD as PCP - General (Internal Medicine)  CHIEF COMPLAINT: Anemia in chronic kidney disease and thrombocytopenia  INTERVAL HISTORY: Patient last seen over 3 years ago.    She has chronic weakness, fatigue, and shortness of breath. She has no neurologic complaints. She denies any fevers. She denies any chest pain. She denies any nausea, vomiting, constipation, or diarrhea. She has no melena or hematochezia. She has no urinary complaints. Patient offers no further specific complaints today.  REVIEW OF SYSTEMS:   Review of Systems  Constitutional: Positive for malaise/fatigue. Negative for fever and weight loss.  Respiratory: Positive for shortness of breath. Negative for cough.   Cardiovascular: Negative.  Negative for chest pain and leg swelling.  Gastrointestinal: Negative.  Negative for abdominal pain, blood in stool and melena.  Genitourinary: Negative.   Musculoskeletal: Negative.   Skin: Negative.  Negative for rash.  Neurological: Positive for weakness.  Psychiatric/Behavioral: Negative.  The patient is not nervous/anxious.     As per HPI. Otherwise, a complete review of systems is negatve.  PAST MEDICAL HISTORY: Past Medical History:  Diagnosis Date   Anemia, unspecified    CHF (congestive heart failure) ([facility])    Chronic ischemic heart disease, unspecified    COPD (chronic obstructive pulmonary disease) ([facility])    Dependence on supplemental oxygen    Diabetes mellitus without complication ([facility])    Gout, unspecified    Hypertension    Mixed hyperlipidemia    Obstructive chronic bronchitis without exacerbation ([facility])    Obstructive sleep apnea syndrome    Peripheral venous insufficiency    Primary generalized hypertrophic osteoarthrosis    Primary hypothyroidism    Severe recurrent  major depression with psychotic features ([facility])     PAST SURGICAL HISTORY: Past Surgical History:  Procedure Laterality Date   TONSILLECTOMY      FAMILY HISTORY Family History  Problem Relation Age of Onset   Hypertension Mother    Diabetes Mother    CAD Mother    CAD Father    Diabetes Father    Hypertension Father    Hypertension Sister    Diabetes Sister    CAD Sister        ADVANCED DIRECTIVES:    HEALTH MAINTENANCE: Social History   Tobacco Use   Smoking status: Former Smoker   Smokeless tobacco: Never Used  Building services engineer Use: Never used  Substance Use Topics   Alcohol use: No   Drug use: No     Colonoscopy:  PAP:  Bone density:  Lipid panel:  No Known Allergies  Current Outpatient Medications  Medication Sig Dispense Refill   ADVAIR DISKUS 250-50 MCG/DOSE AEPB INHALE 1 PUFF BY MOUTH INTO THE LUNGS 2 TIMES A DAY 60 each 5   allopurinol (ZYLOPRIM) 100 MG tablet TAKE 1 TABLET BY MOUTH DAILY 30 tablet 5   amitriptyline (ELAVIL) 25 MG tablet TAKE 2 TABLETS BY MOUTH AT BEDTIME 60 tablet 5   aspirin EC 81 MG tablet Take 81 mg by mouth daily.     Blood Glucose Monitoring Suppl (ONE TOUCH ULTRA 2) w/Device KIT by Does not apply route. Use as directed DX e11.65     clopidogrel (PLAVIX) 75 MG tablet TAKE 1 TABLET BY MOUTH DAILY 30 tablet 5   COMBIVENT RESPIMAT 20-100 MCG/ACT AERS respimat INHALE 1 PUFF BY MOUTH  INTO THE LUNGS EVERY 6 HOURS. 4 g 5   diclofenac sodium (VOLTAREN) 1 % GEL Apply 2 g topically 3 (three) times daily. 1 Tube 3   docusate sodium (COLACE) 100 MG capsule Take 50 mg by mouth daily as needed. Reported on 06/28/2015     DULoxetine (CYMBALTA) 60 MG capsule TAKE ONE CAPSULE BY MOUTH TWICE A DAY 60 capsule 5   fluticasone (FLONASE) 50 MCG/ACT nasal spray PLACE 2 SPRAYS INTO BOTH NOSTRILS DAILY 16 g 5   folic acid (FOLVITE) 1 MG tablet TAKE 1 TABLET BY MOUTH DAILY 30 tablet 5   glucose blood (ONETOUCH ULTRA) test  strip 1 each by Other route 4 (four) times daily. DX e11.65     insulin aspart (NOVOLOG FLEXPEN) 100 UNIT/ML FlexPen Use as directed with sliding scale maximum 63 units no more 3 pen 3   Insulin Glargine (BASAGLAR KWIKPEN) 100 UNIT/ML Inject 50 units subcutaneously every day and inject 22 units subcutaneously at bedtime 15 mL 5   Insulin Pen Needle (GLOBAL EASE INJECT PEN NEEDLES) 32G X 4 MM MISC Use  As directed 200 each 5   isosorbide mononitrate (IMDUR) 30 MG 24 hr tablet TAKE 1 TABLET BY MOUTH DAILY 30 tablet 5   lidocaine (XYLOCAINE) 2 % solution Apply small amount to affected areas three to four times daily prn pain. 100 mL 0   loratadine (CLARITIN) 10 MG tablet TAKE 1 TABLET BY MOUTH DAILY 30 tablet 5   Melatonin 10 MG TABS Take 40 mg by mouth at bedtime.     metoprolol succinate (TOPROL-XL) 25 MG 24 hr tablet TAKE 1 TABLET BY MOUTH DAILY 30 tablet 5   Microlet Lancets MISC by Does not apply route 4 (four) times daily. DX e11.65     Multiple Vitamins-Minerals (MULTIVITAMIN WITH MINERALS) tablet Take 1 tablet by mouth daily.     nitroGLYCERIN (NITROLINGUAL) 0.4 MG/SPRAY spray PLACE 1 SPRAY UNDER THE TONGUE EVERY 5 MINUTES FOR 3 DOSES AS NEEDED FOR CHEST PAIN. 12 g 3   nystatin (NYAMYC) powder APPLY TOPICALLY 6 TIMES DAILY 600 g 1   omeprazole (PRILOSEC) 40 MG capsule TAKE 1 CAPSULE BY MOUTH DAILY 30 capsule 5   OXYGEN Inhale into the lungs. Inhale 4 liters into lungs continously     silver sulfADIAZINE (SILVADENE) 1 % cream APPLY 1 APPLICATION TOPICALLY DAILY 400 g 3   simvastatin (ZOCOR) 80 MG tablet Take 80 mg by mouth daily.     sulfamethoxazole-trimethoprim (BACTRIM) 400-80 MG tablet Take 1 tablet by mouth 2 (two) times daily. 14 tablet 0   traMADol (ULTRAM) 50 MG tablet TAKE 1 TABLET BY MOUTH EVERY 12 HOURS ASNEEDED FOR MODERATE PAIN 60 tablet 1   VICTOZA 18 MG/3ML SOPN INJECT 1.8 MG EVERY MORNING 9 mL 5   No current facility-administered medications for this visit.     OBJECTIVE: There were no vitals filed for this visit.   There is no height or weight on file to calculate BMI.    ECOG FS:1 - Symptomatic but completely ambulatory  General:  Obese, sitting in a wheelchair. no acute distress. Eyes: Pink conjunctiva, anicteric sclera. Lungs: Clear to auscultation bilaterally. Heart: Regular rate and rhythm. No rubs, murmurs, or gallops. Abdomen: Soft, nontender, nondistended. No organomegaly noted, normoactive bowel sounds. Musculoskeletal: No edema, cyanosis, or clubbing. Neuro: Alert, answering all questions appropriately. Cranial nerves grossly intact. Skin: No rashes or petechiae noted. Psych: Normal affect.   LAB RESULTS:  Lab Results  Component Value Date  NA 137 07/03/2019   K 3.8 07/03/2019   CL 104 07/03/2019   CO2 26 07/03/2019   GLUCOSE 172 (H) 07/03/2019   BUN 32 (H) 07/03/2019   CREATININE 1.69 (H) 07/04/2019   CALCIUM 8.1 (L) 07/03/2019   PROT 6.1 (L) 07/03/2019   ALBUMIN 3.0 (L) 07/03/2019   AST 15 07/03/2019   ALT 15 07/03/2019   ALKPHOS 69 07/03/2019   BILITOT 0.4 07/03/2019   GFRNONAA 31 (L) 07/04/2019   GFRAA 36 (L) 07/04/2019    Lab Results  Component Value Date   WBC 7.9 07/04/2019   NEUTROABS 6.5 07/03/2019   HGB 8.4 (L) 07/04/2019   HCT 25.8 (L) 07/04/2019   MCV 93.1 07/04/2019   PLT 139 (L) 07/04/2019   Lab Results  Component Value Date   IRON 62 05/22/2016   TIBC 324 05/22/2016   IRONPCTSAT 19 05/22/2016    Lab Results  Component Value Date   FERRITIN 159 05/22/2016     STUDIES: b  No results found.  ASSESSMENT: Anemia in chronic kidney disease and thrombocytopenia:  PLAN:    1. Anemia in chronic kidney disease: Patient's hemoglobin Continues to be greater than 10.0. Her iron stores are also within normal limits. Previously, all of her labwork was either negative or within normal limits. No intervention is needed at this time. After lengthy discussion with the patient, as agreed upon the  no further follow-up is necessary. Please refer patient back if her hemoglobin remains persistently below 10.0. 2. Thrombocytopenia: Patient's platelet count remains decreased, but essentially unchanged. No bone marrow biopsy is needed at this time. Previously, all of her laboratory work was either negative or within normal limits. No follow-up necessary as above.  3. Chronic renal insufficiency: Mild, if patient's hemoglobin falls below 10.0 she may benefit from Procrit in the future.  Patient expressed understanding and was in agreement with this plan. She also understands that She can call clinic at any time with any questions, concerns, or complaints.   Lloyd Huger, MD   12/02/2019 4:12 PM

## 2019-12-06 ENCOUNTER — Encounter: Payer: Medicare Other | Admitting: Oncology

## 2019-12-06 ENCOUNTER — Other Ambulatory Visit: Payer: Medicare Other

## 2019-12-09 ENCOUNTER — Inpatient Hospital Stay: Payer: Medicare Other

## 2019-12-09 ENCOUNTER — Inpatient Hospital Stay: Payer: Medicare Other | Admitting: Oncology

## 2019-12-10 ENCOUNTER — Telehealth: Payer: Self-pay

## 2019-12-10 NOTE — Telephone Encounter (Signed)
Standard written order signed by provider and faxed back to [facility] with [facility] [phone]. Placed in scan.

## 2020-01-06 ENCOUNTER — Telehealth: Payer: Self-pay

## 2020-01-06 NOTE — Telephone Encounter (Signed)
Faxed updated standard written order for power wheelchair to Debbie at [facility] at [phone]. Copy placed in scan.

## 2020-01-13 ENCOUNTER — Telehealth: Payer: Self-pay

## 2020-01-13 NOTE — Telephone Encounter (Signed)
Corrected detailed written order signed and faxed back to Debbie with [facility] at [phone].Copy placed in scan.

## 2020-01-19 NOTE — Progress Notes (Signed)
[name]  [phone] [phone]  ID: Deanna Rice OB: October 17, 1955  MR#: [id number]  BTD#:[id number]  Patient Care Team: Lavera Guise, MD as PCP - General (Internal Medicine)  CHIEF COMPLAINT: Anemia in chronic kidney disease and thrombocytopenia  INTERVAL HISTORY: Patient was last evaluated in clinic in 2018.  She is referred back for further evaluation of her anemia and thrombocytopenia.  She currently feels well and is at her baseline.  She has chronic shortness of breath and requires oxygen 24 hours a day.  She has noticed no change in her energy levels.  She has good appetite and denies weight loss.  She has no neurologic complaints.  She denies any chest pain, cough, or hemoptysis.  She has no nausea, vomiting, constipation, or diarrhea.  She has no melena or hematochezia.  She has no urinary complaints.  Patient offers no further specific complaints today.  REVIEW OF SYSTEMS:   Review of Systems  Constitutional: Positive for malaise/fatigue. Negative for fever and weight loss.  Respiratory: Positive for shortness of breath. Negative for cough.   Cardiovascular: Negative.  Negative for chest pain and leg swelling.  Gastrointestinal: Negative.  Negative for abdominal pain, blood in stool and melena.  Genitourinary: Negative.  Negative for dysuria.  Musculoskeletal: Negative.  Negative for back pain.  Skin: Negative.  Negative for rash.  Neurological: Positive for weakness. Negative for dizziness and headaches.  Psychiatric/Behavioral: Negative.  The patient is not nervous/anxious.     As per HPI. Otherwise, a complete review of systems is negatve.  PAST MEDICAL HISTORY: Past Medical History:  Diagnosis Date  . Anemia, unspecified   . CHF (congestive heart failure) ([place])   . Chronic ischemic heart disease, unspecified   . COPD (chronic obstructive pulmonary disease) ([place])   . Dependence on supplemental oxygen   . Diabetes mellitus  without complication ([place])   . Gout, unspecified   . Hypertension   . Mixed hyperlipidemia   . 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 Obstructive sleep apnea syndrome   . Peripheral venous insufficiency   . Primary generalized hypertrophic osteoarthrosis   . Primary hypothyroidism   . Severe recurrent major depression with psychotic features ([place])     PAST SURGICAL HISTORY: Past Surgical History:  Procedure Laterality Date  . TONSILLECTOMY      FAMILY HISTORY Family History  Problem Relation Age of Onset  . Hypertension Mother   . Diabetes Mother   . CAD Mother   . CAD Father   . Diabetes Father   . Hypertension Father   . Hypertension Sister   . Diabetes Sister   . CAD Sister        ADVANCED DIRECTIVES:    HEALTH MAINTENANCE: Social History   Tobacco Use  . Smoking status: Former Research scientist (life sciences)  . Smokeless tobacco: Never Used  Vaping Use  . Vaping Use: Never used  Substance Use Topics  . Alcohol use: No  . Drug use: No     Colonoscopy:  PAP:  Bone density:  Lipid panel:  No Known Allergies  Current Outpatient Medications  Medication Sig Dispense Refill  . ADVAIR DISKUS 250-50 MCG/DOSE AEPB INHALE 1 PUFF BY MOUTH INTO THE LUNGS 2 TIMES A DAY 60 each 5  . allopurinol (ZYLOPRIM) 100 MG tablet TAKE 1 TABLET BY MOUTH DAILY 30 tablet 5  . amitriptyline (ELAVIL) 25 MG tablet TAKE 2 TABLETS BY MOUTH AT BEDTIME 60 tablet 5  . aspirin EC 81 MG  tablet Take 81 mg by mouth daily.    . Blood Glucose Monitoring Suppl (ONE TOUCH ULTRA 2) w/Device KIT by Does not apply route. Use as directed DX e11.65    . clopidogrel (PLAVIX) 75 MG tablet TAKE 1 TABLET BY MOUTH DAILY 30 tablet 5  . COMBIVENT RESPIMAT 20-100 MCG/ACT AERS respimat INHALE 1 PUFF BY MOUTH INTO THE LUNGS EVERY 6 HOURS. 4 g 5  . diclofenac sodium (VOLTAREN) 1 % GEL Apply 2 g topically 3 (three) times daily. 1 Tube 3  . docusate sodium (COLACE) 100 MG capsule Take 50 mg by mouth daily as  needed. Reported on 06/28/2015    . DULoxetine (CYMBALTA) 60 MG capsule TAKE ONE CAPSULE BY MOUTH TWICE A DAY 60 capsule 5  . fluticasone (FLONASE) 50 MCG/ACT nasal spray PLACE 2 SPRAYS INTO BOTH NOSTRILS DAILY 16 g 5  . folic acid (FOLVITE) 1 MG tablet TAKE 1 TABLET BY MOUTH DAILY 30 tablet 5  . glucose blood (ONETOUCH ULTRA) test strip 1 each by Other route 4 (four) times daily. DX e11.65    . insulin aspart (NOVOLOG FLEXPEN) 100 UNIT/ML FlexPen Use as directed with sliding scale maximum 63 units no more 3 pen 3  . Insulin Glargine (BASAGLAR KWIKPEN) 100 UNIT/ML Inject 50 units subcutaneously every day and inject 22 units subcutaneously at bedtime 15 mL 5  . Insulin Pen Needle (GLOBAL EASE INJECT PEN NEEDLES) 32G X 4 MM MISC Use  As directed 200 each 5  . isosorbide mononitrate (IMDUR) 30 MG 24 hr tablet TAKE 1 TABLET BY MOUTH DAILY 30 tablet 5  . lidocaine (XYLOCAINE) 2 % solution Apply small amount to affected areas three to four times daily prn pain. 100 mL 0  . loratadine (CLARITIN) 10 MG tablet TAKE 1 TABLET BY MOUTH DAILY 30 tablet 5  . Melatonin 10 MG TABS Take 40 mg by mouth at bedtime.    . metoprolol succinate (TOPROL-XL) 25 MG 24 hr tablet TAKE 1 TABLET BY MOUTH DAILY 30 tablet 5  . Microlet Lancets MISC by Does not apply route 4 (four) times daily. DX e11.65    . Multiple Vitamins-Minerals (MULTIVITAMIN WITH MINERALS) tablet Take 1 tablet by mouth daily.    . nitroGLYCERIN (NITROLINGUAL) 0.4 MG/SPRAY spray PLACE 1 SPRAY UNDER THE TONGUE EVERY 5 MINUTES FOR 3 DOSES AS NEEDED FOR CHEST PAIN. 12 g 3  . nystatin (NYAMYC) powder APPLY TOPICALLY 6 TIMES DAILY 600 g 1  . omeprazole (PRILOSEC) 40 MG capsule TAKE 1 CAPSULE BY MOUTH DAILY 30 capsule 5  . OXYGEN Inhale into the lungs. Inhale 4 liters into lungs continously    . silver sulfADIAZINE (SILVADENE) 1 % cream APPLY 1 APPLICATION TOPICALLY DAILY 400 g 3  . simvastatin (ZOCOR) 80 MG tablet Take 80 mg by mouth daily.    Marland Kitchen  sulfamethoxazole-trimethoprim (BACTRIM) 400-80 MG tablet Take 1 tablet by mouth 2 (two) times daily. 14 tablet 0  . traMADol (ULTRAM) 50 MG tablet TAKE 1 TABLET BY MOUTH EVERY 12 HOURS ASNEEDED FOR MODERATE PAIN 60 tablet 1  . VICTOZA 18 MG/3ML SOPN INJECT 1.8 MG EVERY MORNING 9 mL 5   No current facility-administered medications for this visit.    OBJECTIVE: Vitals:   01/24/20 1423  BP: (!) 154/88  Pulse: (!) 105  Resp: 18  Temp: (!) 97.4 F (36.3 C)  SpO2: 98%     Body mass index is 56.13 kg/m.    ECOG FS:1 - Symptomatic but completely ambulatory  General:  Well-developed, well-nourished, no acute distress.  Sitting in a wheelchair. Eyes: Pink conjunctiva, anicteric sclera. HEENT: Normocephalic, moist mucous membranes. Lungs: No audible wheezing or coughing. Heart: Regular rate and rhythm. Abdomen: Soft, nontender, no obvious distention. Musculoskeletal: No edema, cyanosis, or clubbing. Neuro: Alert, answering all questions appropriately. Cranial nerves grossly intact. Skin: No rashes or petechiae noted. Psych: Normal affect.   LAB RESULTS:  Lab Results  Component Value Date   NA 138 01/24/2020   K 4.2 01/24/2020   CL 100 01/24/2020   CO2 27 01/24/2020   GLUCOSE 188 (H) 01/24/2020   BUN 25 (H) 01/24/2020   CREATININE 1.60 (H) 01/24/2020   CALCIUM 8.0 (L) 01/24/2020   PROT 6.7 01/24/2020   ALBUMIN 3.4 (L) 01/24/2020   AST 16 01/24/2020   ALT 15 01/24/2020   ALKPHOS 69 01/24/2020   BILITOT 0.5 01/24/2020   GFRNONAA 36 (L) 01/24/2020   GFRAA 36 (L) 07/04/2019    Lab Results  Component Value Date   WBC 5.8 01/24/2020   NEUTROABS 6.5 07/03/2019   HGB 8.4 (L) 01/24/2020   HCT 27.4 (L) 01/24/2020   MCV 92.9 01/24/2020   PLT 98 (L) 01/24/2020   Lab Results  Component Value Date   IRON 62 05/22/2016   TIBC 324 05/22/2016   IRONPCTSAT 19 05/22/2016    Lab Results  Component Value Date   FERRITIN 159 05/22/2016     STUDIES: b  No results  found.  ASSESSMENT: Anemia in chronic kidney disease and thrombocytopenia:  PLAN:    1. Anemia in chronic kidney disease: Patient's hemoglobin is decreased and is now 8.4.  Reticulocyte sound is only mildly elevated.  LDH is within normal limits therefore there is likely no hemolysis.  The remainder of her laboratory work is pending at time of dictation.  Suspect this is related to her chronic renal insufficiency and patient may benefit from Retacrit in the future.  No further intervention is needed.  Patient does not require bone marrow biopsy.  Return to clinic in approximately 1 month for further evaluation and initiation of treatment if needed.   2. Thrombocytopenia: Chronic and unchanged.  Patient's platelet count is 98. 3.  Chronic renal insufficiency: Patient's creatinine is elevated to 1.6 which is approximately her baseline.  Consider Retacrit as above. 4.  Hypertension: Patient's blood pressure is moderately elevated today.  Continue monitoring and treatment per primary care.  Patient expressed understanding and was in agreement with this plan. She also understands that She can call clinic at any time with any questions, concerns, or complaints.   Lloyd Huger, MD   01/24/2020 3:22 PM

## 2020-01-24 ENCOUNTER — Encounter (INDEPENDENT_AMBULATORY_CARE_PROVIDER_SITE_OTHER): Payer: Self-pay

## 2020-01-24 ENCOUNTER — Inpatient Hospital Stay: Payer: Medicare Other

## 2020-01-24 ENCOUNTER — Inpatient Hospital Stay: Payer: Medicare Other | Attending: Oncology | Admitting: Oncology

## 2020-01-24 ENCOUNTER — Other Ambulatory Visit: Payer: Self-pay

## 2020-01-24 ENCOUNTER — Encounter: Payer: Self-pay | Admitting: Oncology

## 2020-01-24 VITALS — BP 154/88 | HR 105 | Temp 97.4°F | Resp 18 | Wt 358.4 lb

## 2020-01-24 DIAGNOSIS — G4733 Obstructive sleep apnea (adult) (pediatric): Secondary | ICD-10-CM | POA: Insufficient documentation

## 2020-01-24 DIAGNOSIS — I130 Hypertensive heart and chronic kidney disease with heart failure and stage 1 through stage 4 chronic kidney disease, or unspecified chronic kidney disease: Secondary | ICD-10-CM | POA: Insufficient documentation

## 2020-01-24 DIAGNOSIS — I129 Hypertensive chronic kidney disease with stage 1 through stage 4 chronic kidney disease, or unspecified chronic kidney disease: Secondary | ICD-10-CM

## 2020-01-24 DIAGNOSIS — I259 Chronic ischemic heart disease, unspecified: Secondary | ICD-10-CM | POA: Diagnosis not present

## 2020-01-24 DIAGNOSIS — E039 Hypothyroidism, unspecified: Secondary | ICD-10-CM | POA: Insufficient documentation

## 2020-01-24 DIAGNOSIS — Z87891 Personal history of nicotine dependence: Secondary | ICD-10-CM | POA: Insufficient documentation

## 2020-01-24 DIAGNOSIS — R531 Weakness: Secondary | ICD-10-CM | POA: Insufficient documentation

## 2020-01-24 DIAGNOSIS — I509 Heart failure, unspecified: Secondary | ICD-10-CM | POA: Insufficient documentation

## 2020-01-24 DIAGNOSIS — D696 Thrombocytopenia, unspecified: Secondary | ICD-10-CM | POA: Diagnosis not present

## 2020-01-24 DIAGNOSIS — N189 Chronic kidney disease, unspecified: Secondary | ICD-10-CM

## 2020-01-24 DIAGNOSIS — Z9981 Dependence on supplemental oxygen: Secondary | ICD-10-CM | POA: Insufficient documentation

## 2020-01-24 DIAGNOSIS — Z79899 Other long term (current) drug therapy: Secondary | ICD-10-CM | POA: Insufficient documentation

## 2020-01-24 DIAGNOSIS — J449 Chronic obstructive pulmonary disease, unspecified: Secondary | ICD-10-CM | POA: Diagnosis not present

## 2020-01-24 DIAGNOSIS — E1122 Type 2 diabetes mellitus with diabetic chronic kidney disease: Secondary | ICD-10-CM

## 2020-01-24 DIAGNOSIS — Z794 Long term (current) use of insulin: Secondary | ICD-10-CM | POA: Insufficient documentation

## 2020-01-24 DIAGNOSIS — D631 Anemia in chronic kidney disease: Secondary | ICD-10-CM | POA: Diagnosis present

## 2020-01-24 DIAGNOSIS — R0602 Shortness of breath: Secondary | ICD-10-CM | POA: Insufficient documentation

## 2020-01-24 DIAGNOSIS — E782 Mixed hyperlipidemia: Secondary | ICD-10-CM | POA: Insufficient documentation

## 2020-01-24 DIAGNOSIS — Z7982 Long term (current) use of aspirin: Secondary | ICD-10-CM | POA: Insufficient documentation

## 2020-01-24 DIAGNOSIS — I872 Venous insufficiency (chronic) (peripheral): Secondary | ICD-10-CM | POA: Insufficient documentation

## 2020-01-24 LAB — COMPREHENSIVE METABOLIC PANEL
ALT: 15 U/L (ref 0–44)
AST: 16 U/L (ref 15–41)
Albumin: 3.4 g/dL — ABNORMAL LOW (ref 3.5–5.0)
Alkaline Phosphatase: 69 U/L (ref 38–126)
Anion gap: 11 (ref 5–15)
BUN: 25 mg/dL — ABNORMAL HIGH (ref 8–23)
CO2: 27 mmol/L (ref 22–32)
Calcium: 8 mg/dL — ABNORMAL LOW (ref 8.9–10.3)
Chloride: 100 mmol/L (ref 98–111)
Creatinine, Ser: 1.6 mg/dL — ABNORMAL HIGH (ref 0.44–1.00)
GFR, Estimated: 36 mL/min — ABNORMAL LOW (ref >60)
Glucose, Bld: 188 mg/dL — ABNORMAL HIGH (ref 70–99)
Potassium: 4.2 mmol/L (ref 3.5–5.1)
Sodium: 138 mmol/L (ref 135–145)
Total Bilirubin: 0.5 mg/dL (ref 0.3–1.2)
Total Protein: 6.7 g/dL (ref 6.5–8.1)

## 2020-01-24 LAB — CBC
HCT: 27.4 % — ABNORMAL LOW (ref 36.0–46.0)
Hemoglobin: 8.4 g/dL — ABNORMAL LOW (ref 12.0–15.0)
MCH: 28.5 pg (ref 26.0–34.0)
MCHC: 30.7 g/dL (ref 30.0–36.0)
MCV: 92.9 fL (ref 80.0–100.0)
Platelets: 98 10*3/uL — ABNORMAL LOW (ref 150–400)
RBC: 2.95 MIL/uL — ABNORMAL LOW (ref 3.87–5.11)
RDW: 16.9 % — ABNORMAL HIGH (ref 11.5–15.5)
WBC: 5.8 10*3/uL (ref 4.0–10.5)
nRBC: 0 % (ref 0.0–0.2)

## 2020-01-24 LAB — RETICULOCYTES
Immature Retic Fract: 20.9 % — ABNORMAL HIGH (ref 2.3–15.9)
RBC.: 2.9 MIL/uL — ABNORMAL LOW (ref 3.87–5.11)
Retic Count, Absolute: 94 10*3/uL (ref 19.0–186.0)
Retic Ct Pct: 3.2 % — ABNORMAL HIGH (ref 0.4–3.1)

## 2020-01-24 LAB — FOLATE: Folate: 68 ng/mL (ref >5.9)

## 2020-01-24 LAB — LACTATE DEHYDROGENASE: LDH: 142 U/L (ref 98–192)

## 2020-01-24 LAB — IRON AND TIBC
Iron: 38 ug/dL (ref 28–170)
Saturation Ratios: 13 % (ref 10.4–31.8)
TIBC: 294 ug/dL (ref 250–450)
UIBC: 256 ug/dL

## 2020-01-24 LAB — VITAMIN B12: Vitamin B-12: 281 pg/mL (ref 180–914)

## 2020-01-24 LAB — DAT, POLYSPECIFIC AHG (ARMC ONLY): Polyspecific AHG test: NEGATIVE

## 2020-01-24 LAB — FERRITIN: Ferritin: 94 ng/mL (ref 11–307)

## 2020-01-25 LAB — ERYTHROPOIETIN: Erythropoietin: 52.9 m[IU]/mL — ABNORMAL HIGH (ref 2.6–18.5)

## 2020-01-25 LAB — PROTEIN ELECTROPHORESIS, SERUM
A/G Ratio: 1 (ref 0.7–1.7)
Albumin ELP: 3 g/dL (ref 2.9–4.4)
Alpha-1-Globulin: 0.2 g/dL (ref 0.0–0.4)
Alpha-2-Globulin: 0.8 g/dL (ref 0.4–1.0)
Beta Globulin: 0.9 g/dL (ref 0.7–1.3)
Gamma Globulin: 1 g/dL (ref 0.4–1.8)
Globulin, Total: 2.9 g/dL (ref 2.2–3.9)
Total Protein ELP: 5.9 g/dL — ABNORMAL LOW (ref 6.0–8.5)

## 2020-01-25 LAB — PLATELET ANTIBODY PROFILE
Glycoprotein IV Antibody: NEGATIVE
HLA Ab Ser Ql EIA: NEGATIVE
IB/IX Antibody: NEGATIVE
IIB/IIIA Antibody: NEGATIVE

## 2020-01-25 LAB — HAPTOGLOBIN: Haptoglobin: 157 mg/dL (ref 37–355)

## 2020-03-04 NOTE — Progress Notes (Deleted)
[place]  [phone] [phone]) [phone]  ID: SKAI LICKTEIG OB: 05-25-1955  MR#: [id number]  IZT#:[id number]  Patient Care Team: Lavera Guise, MD as PCP - General (Internal Medicine)  CHIEF COMPLAINT: Anemia in chronic kidney disease and thrombocytopenia  INTERVAL HISTORY: Patient was last evaluated in clinic in 2018.  She is referred back for further evaluation of her anemia and thrombocytopenia.  She currently feels well and is at her baseline.  She has chronic shortness of breath and requires oxygen 24 hours a day.  She has noticed no change in her energy levels.  She has good appetite and denies weight loss.  She has no neurologic complaints.  She denies any chest pain, cough, or hemoptysis.  She has no nausea, vomiting, constipation, or diarrhea.  She has no melena or hematochezia.  She has no urinary complaints.  Patient offers no further specific complaints today.  REVIEW OF SYSTEMS:   Review of Systems  Constitutional: Positive for malaise/fatigue. Negative for fever and weight loss.  Respiratory: Positive for shortness of breath. Negative for cough.   Cardiovascular: Negative.  Negative for chest pain and leg swelling.  Gastrointestinal: Negative.  Negative for abdominal pain, blood in stool and melena.  Genitourinary: Negative.  Negative for dysuria.  Musculoskeletal: Negative.  Negative for back pain.  Skin: Negative.  Negative for rash.  Neurological: Positive for weakness. Negative for dizziness and headaches.  Psychiatric/Behavioral: Negative.  The patient is not nervous/anxious.     As per HPI. Otherwise, a complete review of systems is negatve.  PAST MEDICAL HISTORY: Past Medical History:  Diagnosis Date  . Anemia, unspecified   . CHF (congestive heart failure) ([place])   . Chronic ischemic heart disease, unspecified   . COPD (chronic obstructive pulmonary disease) ([place])   . Dependence on supplemental oxygen   . Diabetes mellitus  without complication ([place])   . Gout, unspecified   . Hypertension   . Mixed hyperlipidemia   . Obstructive chronic bronchitis without exacerbation ([place])   . Obstructive sleep apnea syndrome   . Peripheral venous insufficiency   . Primary generalized hypertrophic osteoarthrosis   . Primary hypothyroidism   . Severe recurrent major depression with psychotic features ([place])     PAST SURGICAL HISTORY: Past Surgical History:  Procedure Laterality Date  . TONSILLECTOMY      FAMILY HISTORY Family History  Problem Relation Age of Onset  . Hypertension Mother   . Diabetes Mother   . CAD Mother   . CAD Father   . Diabetes Father   . Hypertension Father   . Hypertension Sister   . Diabetes Sister   . CAD Sister        ADVANCED DIRECTIVES:    HEALTH MAINTENANCE: Social History   Tobacco Use  . Smoking status: Former Research scientist (life sciences)  . Smokeless tobacco: Never Used  Vaping Use  . Vaping Use: Never used  Substance Use Topics  . Alcohol use: No  . Drug use: No     Colonoscopy:  PAP:  Bone density:  Lipid panel:  No Known Allergies  Current Outpatient Medications  Medication Sig Dispense Refill  . ADVAIR DISKUS 250-50 MCG/DOSE AEPB INHALE 1 PUFF BY MOUTH INTO THE LUNGS 2 TIMES A DAY 60 each 5  . allopurinol (ZYLOPRIM) 100 MG tablet TAKE 1 TABLET BY MOUTH DAILY 30 tablet 5  . amitriptyline (ELAVIL) 25 MG tablet TAKE 2 TABLETS BY MOUTH AT BEDTIME 60 tablet 5  . aspirin EC 81 MG  tablet Take 81 mg by mouth daily.    . Blood Glucose Monitoring Suppl (ONE TOUCH ULTRA 2) w/Device KIT by Does not apply route. Use as directed DX e11.65    . clopidogrel (PLAVIX) 75 MG tablet TAKE 1 TABLET BY MOUTH DAILY 30 tablet 5  . COMBIVENT RESPIMAT 20-100 MCG/ACT AERS respimat INHALE 1 PUFF BY MOUTH INTO THE LUNGS EVERY 6 HOURS. 4 g 5  . diclofenac sodium (VOLTAREN) 1 % GEL Apply 2 g topically 3 (three) times daily. 1 Tube 3  . docusate sodium (COLACE) 100 MG capsule Take 50 mg by mouth daily as  needed. Reported on 06/28/2015    . DULoxetine (CYMBALTA) 60 MG capsule TAKE ONE CAPSULE BY MOUTH TWICE A DAY 60 capsule 5  . fluticasone (FLONASE) 50 MCG/ACT nasal spray PLACE 2 SPRAYS INTO BOTH NOSTRILS DAILY 16 g 5  . folic acid (FOLVITE) 1 MG tablet TAKE 1 TABLET BY MOUTH DAILY 30 tablet 5  . glucose blood (ONETOUCH ULTRA) test strip 1 each by Other route 4 (four) times daily. DX e11.65    . insulin aspart (NOVOLOG FLEXPEN) 100 UNIT/ML FlexPen Use as directed with sliding scale maximum 63 units no more 3 pen 3  . Insulin Glargine (BASAGLAR KWIKPEN) 100 UNIT/ML Inject 50 units subcutaneously every day and inject 22 units subcutaneously at bedtime 15 mL 5  . Insulin Pen Needle (GLOBAL EASE INJECT PEN NEEDLES) 32G X 4 MM MISC Use  As directed 200 each 5  . isosorbide mononitrate (IMDUR) 30 MG 24 hr tablet TAKE 1 TABLET BY MOUTH DAILY 30 tablet 5  . lidocaine (XYLOCAINE) 2 % solution Apply small amount to affected areas three to four times daily prn pain. 100 mL 0  . loratadine (CLARITIN) 10 MG tablet TAKE 1 TABLET BY MOUTH DAILY 30 tablet 5  . Melatonin 10 MG TABS Take 40 mg by mouth at bedtime.    . metoprolol succinate (TOPROL-XL) 25 MG 24 hr tablet TAKE 1 TABLET BY MOUTH DAILY 30 tablet 5  . Microlet Lancets MISC by Does not apply route 4 (four) times daily. DX e11.65    . Multiple Vitamins-Minerals (MULTIVITAMIN WITH MINERALS) tablet Take 1 tablet by mouth daily.    . nitroGLYCERIN (NITROLINGUAL) 0.4 MG/SPRAY spray PLACE 1 SPRAY UNDER THE TONGUE EVERY 5 MINUTES FOR 3 DOSES AS NEEDED FOR CHEST PAIN. 12 g 3  . nystatin (NYAMYC) powder APPLY TOPICALLY 6 TIMES DAILY 600 g 1  . omeprazole (PRILOSEC) 40 MG capsule TAKE 1 CAPSULE BY MOUTH DAILY 30 capsule 5  . OXYGEN Inhale into the lungs. Inhale 4 liters into lungs continously    . silver sulfADIAZINE (SILVADENE) 1 % cream APPLY 1 APPLICATION TOPICALLY DAILY 400 g 3  . simvastatin (ZOCOR) 80 MG tablet Take 80 mg by mouth daily.    Marland Kitchen  sulfamethoxazole-trimethoprim (BACTRIM) 400-80 MG tablet Take 1 tablet by mouth 2 (two) times daily. 14 tablet 0  . traMADol (ULTRAM) 50 MG tablet TAKE 1 TABLET BY MOUTH EVERY 12 HOURS ASNEEDED FOR MODERATE PAIN 60 tablet 1  . VICTOZA 18 MG/3ML SOPN INJECT 1.8 MG EVERY MORNING 9 mL 5   No current facility-administered medications for this visit.    OBJECTIVE: There were no vitals filed for this visit.   There is no height or weight on file to calculate BMI.    ECOG FS:1 - Symptomatic but completely ambulatory  General: Well-developed, well-nourished, no acute distress.  Sitting in a wheelchair. Eyes: Pink conjunctiva, anicteric sclera.  HEENT: Normocephalic, moist mucous membranes. Lungs: No audible wheezing or coughing. Heart: Regular rate and rhythm. Abdomen: Soft, nontender, no obvious distention. Musculoskeletal: No edema, cyanosis, or clubbing. Neuro: Alert, answering all questions appropriately. Cranial nerves grossly intact. Skin: No rashes or petechiae noted. Psych: Normal affect.   LAB RESULTS:  Lab Results  Component Value Date   NA 138 01/24/2020   K 4.2 01/24/2020   CL 100 01/24/2020   CO2 27 01/24/2020   GLUCOSE 188 (H) 01/24/2020   BUN 25 (H) 01/24/2020   CREATININE 1.60 (H) 01/24/2020   CALCIUM 8.0 (L) 01/24/2020   PROT 6.7 01/24/2020   ALBUMIN 3.4 (L) 01/24/2020   AST 16 01/24/2020   ALT 15 01/24/2020   ALKPHOS 69 01/24/2020   BILITOT 0.5 01/24/2020   GFRNONAA 36 (L) 01/24/2020   GFRAA 36 (L) 07/04/2019    Lab Results  Component Value Date   WBC 5.8 01/24/2020   NEUTROABS 6.5 07/03/2019   HGB 8.4 (L) 01/24/2020   HCT 27.4 (L) 01/24/2020   MCV 92.9 01/24/2020   PLT 98 (L) 01/24/2020   Lab Results  Component Value Date   IRON 38 01/24/2020   TIBC 294 01/24/2020   IRONPCTSAT 13 01/24/2020    Lab Results  Component Value Date   FERRITIN 94 01/24/2020     STUDIES: b  No results found.  ASSESSMENT: Anemia in chronic kidney disease and  thrombocytopenia:  PLAN:    1. Anemia in chronic kidney disease: Patient's hemoglobin is decreased and is now 8.4.  Reticulocyte sound is only mildly elevated.  LDH is within normal limits therefore there is likely no hemolysis.  The remainder of her laboratory work is pending at time of dictation.  Suspect this is related to her chronic renal insufficiency and patient may benefit from Retacrit in the future.  No further intervention is needed.  Patient does not require bone marrow biopsy.  Return to clinic in approximately 1 month for further evaluation and initiation of treatment if needed.   2. Thrombocytopenia: Chronic and unchanged.  Patient's platelet count is 98. 3.  Chronic renal insufficiency: Patient's creatinine is elevated to 1.6 which is approximately her baseline.  Consider Retacrit as above. 4.  Hypertension: Patient's blood pressure is moderately elevated today.  Continue monitoring and treatment per primary care.  Patient expressed understanding and was in agreement with this plan. She also understands that She can call clinic at any time with any questions, concerns, or complaints.   Lloyd Huger, MD   03/04/2020 10:43 AM

## 2020-03-07 ENCOUNTER — Inpatient Hospital Stay: Payer: Medicare Other | Admitting: Oncology

## 2020-03-07 DIAGNOSIS — D631 Anemia in chronic kidney disease: Secondary | ICD-10-CM

## 2020-03-07 DIAGNOSIS — D696 Thrombocytopenia, unspecified: Secondary | ICD-10-CM

## 2020-03-21 ENCOUNTER — Other Ambulatory Visit: Payer: Self-pay

## 2020-03-21 ENCOUNTER — Emergency Department
Admission: EM | Admit: 2020-03-21 | Discharge: 2020-03-21 | Disposition: A | Discharge status: Home / Self Care | Payer: Medicare Other | Attending: Emergency Medicine | Admitting: Emergency Medicine

## 2020-03-21 DIAGNOSIS — Z7982 Long term (current) use of aspirin: Secondary | ICD-10-CM | POA: Diagnosis not present

## 2020-03-21 DIAGNOSIS — I509 Heart failure, unspecified: Secondary | ICD-10-CM | POA: Insufficient documentation

## 2020-03-21 DIAGNOSIS — N390 Urinary tract infection, site not specified: Secondary | ICD-10-CM | POA: Diagnosis not present

## 2020-03-21 DIAGNOSIS — B379 Candidiasis, unspecified: Secondary | ICD-10-CM | POA: Diagnosis not present

## 2020-03-21 DIAGNOSIS — Z87891 Personal history of nicotine dependence: Secondary | ICD-10-CM | POA: Diagnosis not present

## 2020-03-21 DIAGNOSIS — Z794 Long term (current) use of insulin: Secondary | ICD-10-CM | POA: Insufficient documentation

## 2020-03-21 DIAGNOSIS — E119 Type 2 diabetes mellitus without complications: Secondary | ICD-10-CM | POA: Diagnosis not present

## 2020-03-21 DIAGNOSIS — J449 Chronic obstructive pulmonary disease, unspecified: Secondary | ICD-10-CM | POA: Insufficient documentation

## 2020-03-21 DIAGNOSIS — B373 Candidiasis of vulva and vagina: Secondary | ICD-10-CM

## 2020-03-21 DIAGNOSIS — I110 Hypertensive heart disease with heart failure: Secondary | ICD-10-CM | POA: Diagnosis not present

## 2020-03-21 DIAGNOSIS — Z79899 Other long term (current) drug therapy: Secondary | ICD-10-CM | POA: Diagnosis not present

## 2020-03-21 DIAGNOSIS — B3731 Acute candidiasis of vulva and vagina: Secondary | ICD-10-CM

## 2020-03-21 DIAGNOSIS — Z043 Encounter for examination and observation following other accident: Secondary | ICD-10-CM | POA: Diagnosis present

## 2020-03-21 LAB — URINALYSIS, COMPLETE (UACMP) WITH MICROSCOPIC
Bilirubin Urine: NEGATIVE
Glucose, UA: NEGATIVE mg/dL
Ketones, ur: NEGATIVE mg/dL
Nitrite: NEGATIVE
Protein, ur: >=300 mg/dL — AB
Specific Gravity, Urine: 1.01 (ref 1.005–1.030)
Squamous Epithelial / HPF: NONE SEEN (ref 0–5)
pH: 5 (ref 5.0–8.0)

## 2020-03-21 MED ORDER — CEPHALEXIN 500 MG PO CAPS
500.0000 mg | ORAL_CAPSULE | Freq: Once | ORAL | Status: AC
  Administered 2020-03-21: 500 mg via ORAL
  Filled 2020-03-21: qty 1

## 2020-03-21 MED ORDER — NYSTATIN 100000 UNIT/GM EX POWD: 1.0000 "application " | Freq: Three times a day (TID) | CUTANEOUS | 0 refills | Status: AC

## 2020-03-21 MED ORDER — CEPHALEXIN 500 MG PO CAPS: 500.0000 mg | ORAL_CAPSULE | Freq: Two times a day (BID) | ORAL | 0 refills | Status: AC

## 2020-03-21 NOTE — ED Notes (Signed)
Report called to [facility] staff

## 2020-03-21 NOTE — ED Triage Notes (Signed)
Pt comes into the ED via EMS from [facility] with c/o multiple falls over the last several days, altered mental status per staff. On 4L [place] at baseline.  [id number],  145/64 100% on 4L [place] 78HR

## 2020-03-21 NOTE — ED Triage Notes (Addendum)
Pt comes via EMS from [facility] with c/o fall and possible UTI. Pt states she slid off bed. Pt denies any injuries or pain.  Pt states the nurse thought she might have a UTI and sent her here.  Pt A&OX4

## 2020-03-21 NOTE — ED Provider Notes (Signed)
[facility] Emergency Department Provider Note   ____________________________________________   Event Date/Time   First MD Initiated Contact with Patient 03/21/20 1816     (approximate)  I have reviewed the triage vital signs and the nursing notes.   HISTORY  Chief Complaint Fall    HPI Deanna Rice is a 65 y.o. female with past medical history of hypertension, diabetes, COPD on 4 L, and CHF who presents to the ED complaining of fall.  Patient reports that she slid off of her bed earlier today, hitting her butt on the ground.  She denies hitting her head or losing consciousness, is not anticoagulated.  She denies any pain after the fall, including hip pain, back pain, or extremity pain.  She does complain of a painful rash in her groin area along with some burning when she urinates.  She denies any fevers, abdominal pain, nausea, vomiting, or flank pain.  She resides at [facility] and staff there were concerned that she could have a UTI.        Past Medical History:  Diagnosis Date  . Anemia, unspecified   . CHF (congestive heart failure) ([place])   . Chronic ischemic heart disease, unspecified   . COPD (chronic obstructive pulmonary disease) ([place])   . Dependence on supplemental oxygen   . Diabetes mellitus without complication ([place])   . Gout, unspecified   . Hypertension   . Mixed hyperlipidemia   . Obstructive chronic bronchitis without exacerbation ([place])   . Obstructive sleep apnea syndrome   . Peripheral venous insufficiency   . Primary generalized hypertrophic osteoarthrosis   . Primary hypothyroidism   . Severe recurrent major depression with psychotic features [facility])     Patient Active Problem List   Diagnosis Date Noted  . Chest pain 07/04/2019  . Furuncle of vulva 02/28/2019  . Cellulitis of left lower extremity 12/31/2018  . Diabetic ulcer of left lower leg associated with type 2 diabetes mellitus ([place]) 12/31/2018  . Uncontrolled  type 2 diabetes mellitus with hyperglycemia ([place]) 12/31/2018  . Chronic pain syndrome 08/03/2017  . Oxygen dependent 08/03/2017  . Thrombocytopenia ([place]) 09/28/2015  . Anemia in chronic kidney disease 09/28/2015  . Diarrhea 11/28/2014  . AKI (acute kidney injury) ([place]) 11/28/2014  . COPD (chronic obstructive pulmonary disease) ([place]) 11/28/2014  . Uncontrolled type 2 diabetes mellitus with hypoglycemia ([place]) 11/28/2014  . HTN (hypertension) 11/28/2014    Past Surgical History:  Procedure Laterality Date  . TONSILLECTOMY      Prior to Admission medications   Medication Sig Start Date End Date Taking? Authorizing Provider  cephALEXin (KEFLEX) 500 MG capsule Take 1 capsule (500 mg total) by mouth 2 (two) times daily for 7 days. 03/21/20 03/28/20 Yes Blake Divine, MD  nystatin (MYCOSTATIN/NYSTOP) powder Apply 1 application topically 3 (three) times daily. 03/21/20  Yes Blake Divine, MD  ADVAIR DISKUS 250-50 MCG/DOSE AEPB INHALE 1 PUFF BY MOUTH INTO THE LUNGS 2 TIMES A DAY 05/27/19   Kendell Bane, NP  allopurinol (ZYLOPRIM) 100 MG tablet TAKE 1 TABLET BY MOUTH DAILY 05/27/19   Kendell Bane, NP  amitriptyline (ELAVIL) 25 MG tablet TAKE 2 TABLETS BY MOUTH AT BEDTIME 05/27/19   Kendell Bane, NP  aspirin EC 81 MG tablet Take 81 mg by mouth daily.    [provider]  Blood Glucose Monitoring Suppl (ONE TOUCH ULTRA 2) w/Device KIT by Does not apply route. Use as directed DX e11.65    [provider]  clopidogrel (PLAVIX) 75 MG tablet TAKE 1 TABLET BY MOUTH DAILY 05/27/19   Scarboro, Audie Clear, NP  COMBIVENT RESPIMAT 20-100 MCG/ACT AERS respimat INHALE 1 PUFF BY MOUTH INTO THE LUNGS EVERY 6 HOURS. 05/27/19   Kendell Bane, NP  diclofenac sodium (VOLTAREN) 1 % GEL Apply 2 g topically 3 (three) times daily. 03/26/17   Ronnell Freshwater, NP  docusate sodium (COLACE) 100 MG capsule Take 50 mg by mouth daily as needed. Reported on 06/28/2015    [provider]  DULoxetine  (CYMBALTA) 60 MG capsule TAKE ONE CAPSULE BY MOUTH TWICE A DAY 05/27/19   Kendell Bane, NP  fluticasone (FLONASE) 50 MCG/ACT nasal spray PLACE 2 SPRAYS INTO BOTH NOSTRILS DAILY 05/27/19   Kendell Bane, NP  folic acid (FOLVITE) 1 MG tablet TAKE 1 TABLET BY MOUTH DAILY 05/27/19   Kendell Bane, NP  glucose blood (ONETOUCH ULTRA) test strip 1 each by Other route 4 (four) times daily. DX e11.65    [provider]  insulin aspart (NOVOLOG FLEXPEN) 100 UNIT/ML FlexPen Use as directed with sliding scale maximum 63 units no more 03/30/19   Scarboro, Audie Clear, NP  Insulin Glargine (BASAGLAR KWIKPEN) 100 UNIT/ML Inject 50 units subcutaneously every day and inject 22 units subcutaneously at bedtime 09/14/19   Kendell Bane, NP  Insulin Pen Needle (GLOBAL EASE INJECT PEN NEEDLES) 32G X 4 MM MISC Use  As directed 12/28/18   Ronnell Freshwater, NP  isosorbide mononitrate (IMDUR) 30 MG 24 hr tablet TAKE 1 TABLET BY MOUTH DAILY 05/27/19   Kendell Bane, NP  lidocaine (XYLOCAINE) 2 % solution Apply small amount to affected areas three to four times daily prn pain. 07/27/19   Ronnell Freshwater, NP  loratadine (CLARITIN) 10 MG tablet TAKE 1 TABLET BY MOUTH DAILY 06/23/19   Kendell Bane, NP  Melatonin 10 MG TABS Take 40 mg by mouth at bedtime.    [provider]  metoprolol succinate (TOPROL-XL) 25 MG 24 hr tablet TAKE 1 TABLET BY MOUTH DAILY 05/27/19   Kendell Bane, NP  Microlet Lancets MISC by Does not apply route 4 (four) times daily. DX e11.65    [provider]  Multiple Vitamins-Minerals (MULTIVITAMIN WITH MINERALS) tablet Take 1 tablet by mouth daily.    [provider]  nitroGLYCERIN (NITROLINGUAL) 0.4 MG/SPRAY spray PLACE 1 SPRAY UNDER THE TONGUE EVERY 5 MINUTES FOR 3 DOSES AS NEEDED FOR CHEST PAIN. 06/19/18   Ronnell Freshwater, NP  omeprazole (PRILOSEC) 40 MG capsule TAKE 1 CAPSULE BY MOUTH DAILY 05/27/19   Kendell Bane, NP  OXYGEN Inhale into the lungs. Inhale 4  liters into lungs continously    [provider]  silver sulfADIAZINE (SILVADENE) 1 % cream APPLY 1 APPLICATION TOPICALLY DAILY 02/15/19   Ronnell Freshwater, NP  simvastatin (ZOCOR) 80 MG tablet Take 80 mg by mouth daily.    [provider]  sulfamethoxazole-trimethoprim (BACTRIM) 400-80 MG tablet Take 1 tablet by mouth 2 (two) times daily. 07/20/19   Kendell Bane, NP  traMADol (ULTRAM) 50 MG tablet TAKE 1 TABLET BY MOUTH EVERY 12 HOURS ASNEEDED FOR MODERATE PAIN 08/24/19   Kendell Bane, NP  VICTOZA 18 MG/3ML SOPN INJECT 1.8 MG EVERY MORNING 05/27/19   Kendell Bane, NP    Allergies Patient has no known allergies.  Family History  Problem Relation Age of Onset  . Hypertension Mother   . Diabetes Mother   .  CAD Mother   . CAD Father   . Diabetes Father   . Hypertension Father   . Hypertension Sister   . Diabetes Sister   . CAD Sister     Social History Social History   Tobacco Use  . Smoking status: Former Research scientist (life sciences)  . Smokeless tobacco: Never Used  Vaping Use  . Vaping Use: Never used  Substance Use Topics  . Alcohol use: No  . Drug use: No    Review of Systems  Constitutional: No fever/chills Eyes: No visual changes. ENT: No sore throat. Cardiovascular: Denies chest pain. Respiratory: Denies shortness of breath. Gastrointestinal: No abdominal pain.  No nausea, no vomiting.  No diarrhea.  No constipation. Genitourinary: Positive for dysuria. Musculoskeletal: Negative for back pain. Skin: Positive for rash. Neurological: Negative for headaches, focal weakness or numbness.  ____________________________________________   PHYSICAL EXAM:  VITAL SIGNS: ED Triage Vitals [03/21/20 1433]  Enc Vitals Group     BP 134/60     Pulse Rate 74     Resp 18     Temp 98.3 F (36.8 C)     Temp src      SpO2 100 %     Weight      Height      Head Circumference      Peak Flow      Pain Score 6     Pain Loc      Pain Edu?      Excl. in [place]?      Constitutional: Obese female, alert and oriented. Eyes: Conjunctivae are normal. Head: Atraumatic. Nose: No congestion/rhinnorhea. Mouth/Throat: Mucous membranes are moist. Neck: Normal ROM Cardiovascular: Normal rate, regular rhythm. Grossly normal heart sounds. Respiratory: Normal respiratory effort.  No retractions. Lungs CTAB. Gastrointestinal: Soft and nontender. No distention. Genitourinary: Red and irritated skin around vulva and skin folds of groin with yeastlike material. Musculoskeletal: No lower extremity tenderness nor edema. Neurologic:  Normal speech and language. No gross focal neurologic deficits are appreciated. Skin:  Skin is warm, dry and intact. No rash noted. Psychiatric: Mood and affect are normal. Speech and behavior are normal.  ____________________________________________   LABS (all labs ordered are listed, but only abnormal results are displayed)  Labs Reviewed  URINALYSIS, COMPLETE (UACMP) WITH MICROSCOPIC - Abnormal; Notable for the following components:      Result Value   Color, Urine YELLOW (*)    APPearance HAZY (*)    Hgb urine dipstick SMALL (*)    Protein, ur >=300 (*)    Leukocytes,Ua SMALL (*)    Bacteria, UA MANY (*)    All other components within normal limits  URINE CULTURE   ____________________________________________  EKG   PROCEDURES  Procedure(s) performed (including Critical Care):  Procedures   ____________________________________________   INITIAL IMPRESSION / ASSESSMENT AND PLAN / ED COURSE       65 year old female with past medical history of hypertension, diabetes, COPD on 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  Urine sample shows possible UTI, we will send for culture and treat with Keflex.  Patient also with skin irritation due to what appears to be candidiasis.  We will start patient on topical nystatin.  She denies any complaints related to her fall, no evidence of traumatic injury.   She otherwise feels well and vital signs are reassuring.  She is appropriate for discharge home to nursing facility, was counseled to follow-up with PCP and return to the ED for  new worsening symptoms.  Patient agrees with plan.      ____________________________________________   FINAL CLINICAL IMPRESSION(S) / ED DIAGNOSES  Final diagnoses:  Lower urinary tract infectious disease  Candidiasis of genitalia in female     ED Discharge Orders         Ordered    nystatin (MYCOSTATIN/NYSTOP) powder  3 times daily        03/21/20 2046    cephALEXin (KEFLEX) 500 MG capsule  2 times daily        03/21/20 2046           Note:  This document was prepared using Dragon voice recognition software and may include unintentional dictation errors.   Blake Divine, MD 03/21/20 2048

## 2020-03-24 LAB — URINE CULTURE: Culture: >=100000 — AB

## 2020-03-28 ENCOUNTER — Other Ambulatory Visit: Payer: Self-pay

## 2020-03-28 ENCOUNTER — Emergency Department
Admission: EM | Admit: 2020-03-28 | Discharge: 2020-03-28 | Disposition: A | Discharge status: Home / Self Care | Payer: Medicare Other | Source: Home / Self Care | Attending: Emergency Medicine | Admitting: Emergency Medicine

## 2020-03-28 DIAGNOSIS — J449 Chronic obstructive pulmonary disease, unspecified: Secondary | ICD-10-CM | POA: Insufficient documentation

## 2020-03-28 DIAGNOSIS — I509 Heart failure, unspecified: Secondary | ICD-10-CM | POA: Insufficient documentation

## 2020-03-28 DIAGNOSIS — E1122 Type 2 diabetes mellitus with diabetic chronic kidney disease: Secondary | ICD-10-CM | POA: Insufficient documentation

## 2020-03-28 DIAGNOSIS — E039 Hypothyroidism, unspecified: Secondary | ICD-10-CM | POA: Insufficient documentation

## 2020-03-28 DIAGNOSIS — I5032 Chronic diastolic (congestive) heart failure: Secondary | ICD-10-CM | POA: Diagnosis not present

## 2020-03-28 DIAGNOSIS — Z87891 Personal history of nicotine dependence: Secondary | ICD-10-CM | POA: Insufficient documentation

## 2020-03-28 DIAGNOSIS — M25562 Pain in left knee: Secondary | ICD-10-CM | POA: Diagnosis not present

## 2020-03-28 DIAGNOSIS — I130 Hypertensive heart and chronic kidney disease with heart failure and stage 1 through stage 4 chronic kidney disease, or unspecified chronic kidney disease: Secondary | ICD-10-CM | POA: Insufficient documentation

## 2020-03-28 DIAGNOSIS — E782 Mixed hyperlipidemia: Secondary | ICD-10-CM | POA: Diagnosis not present

## 2020-03-28 DIAGNOSIS — W19XXXA Unspecified fall, initial encounter: Secondary | ICD-10-CM | POA: Diagnosis not present

## 2020-03-28 DIAGNOSIS — Z794 Long term (current) use of insulin: Secondary | ICD-10-CM | POA: Insufficient documentation

## 2020-03-28 DIAGNOSIS — E11649 Type 2 diabetes mellitus with hypoglycemia without coma: Secondary | ICD-10-CM | POA: Diagnosis not present

## 2020-03-28 DIAGNOSIS — N189 Chronic kidney disease, unspecified: Secondary | ICD-10-CM | POA: Insufficient documentation

## 2020-03-28 DIAGNOSIS — Z7982 Long term (current) use of aspirin: Secondary | ICD-10-CM | POA: Insufficient documentation

## 2020-03-28 DIAGNOSIS — M79605 Pain in left leg: Secondary | ICD-10-CM | POA: Insufficient documentation

## 2020-03-28 DIAGNOSIS — Z833 Family history of diabetes mellitus: Secondary | ICD-10-CM | POA: Diagnosis not present

## 2020-03-28 DIAGNOSIS — Z7951 Long term (current) use of inhaled steroids: Secondary | ICD-10-CM | POA: Insufficient documentation

## 2020-03-28 DIAGNOSIS — I872 Venous insufficiency (chronic) (peripheral): Secondary | ICD-10-CM | POA: Diagnosis not present

## 2020-03-28 DIAGNOSIS — W1830XA Fall on same level, unspecified, initial encounter: Secondary | ICD-10-CM | POA: Insufficient documentation

## 2020-03-28 DIAGNOSIS — Z79899 Other long term (current) drug therapy: Secondary | ICD-10-CM | POA: Insufficient documentation

## 2020-03-28 DIAGNOSIS — Z9981 Dependence on supplemental oxygen: Secondary | ICD-10-CM | POA: Diagnosis not present

## 2020-03-28 DIAGNOSIS — Z7902 Long term (current) use of antithrombotics/antiplatelets: Secondary | ICD-10-CM | POA: Insufficient documentation

## 2020-03-28 DIAGNOSIS — J9611 Chronic respiratory failure with hypoxia: Secondary | ICD-10-CM | POA: Diagnosis not present

## 2020-03-28 DIAGNOSIS — I259 Chronic ischemic heart disease, unspecified: Secondary | ICD-10-CM | POA: Diagnosis not present

## 2020-03-28 DIAGNOSIS — G4733 Obstructive sleep apnea (adult) (pediatric): Secondary | ICD-10-CM | POA: Diagnosis not present

## 2020-03-28 DIAGNOSIS — G9341 Metabolic encephalopathy: Secondary | ICD-10-CM | POA: Diagnosis not present

## 2020-03-28 DIAGNOSIS — Z8249 Family history of ischemic heart disease and other diseases of the circulatory system: Secondary | ICD-10-CM | POA: Diagnosis not present

## 2020-03-28 DIAGNOSIS — Z20822 Contact with and (suspected) exposure to covid-19: Secondary | ICD-10-CM | POA: Diagnosis not present

## 2020-03-28 DIAGNOSIS — N390 Urinary tract infection, site not specified: Secondary | ICD-10-CM | POA: Diagnosis not present

## 2020-03-28 LAB — BASIC METABOLIC PANEL
Anion gap: 11 (ref 5–15)
BUN: 29 mg/dL — ABNORMAL HIGH (ref 8–23)
CO2: 25 mmol/L (ref 22–32)
Calcium: 7.8 mg/dL — ABNORMAL LOW (ref 8.9–10.3)
Chloride: 105 mmol/L (ref 98–111)
Creatinine, Ser: 1.62 mg/dL — ABNORMAL HIGH (ref 0.44–1.00)
GFR, Estimated: 35 mL/min — ABNORMAL LOW (ref >60)
Glucose, Bld: 62 mg/dL — ABNORMAL LOW (ref 70–99)
Potassium: 3.8 mmol/L (ref 3.5–5.1)
Sodium: 141 mmol/L (ref 135–145)

## 2020-03-28 LAB — CBC
HCT: 26.6 % — ABNORMAL LOW (ref 36.0–46.0)
Hemoglobin: 8.2 g/dL — ABNORMAL LOW (ref 12.0–15.0)
MCH: 28.2 pg (ref 26.0–34.0)
MCHC: 30.8 g/dL (ref 30.0–36.0)
MCV: 91.4 fL (ref 80.0–100.0)
Platelets: 134 10*3/uL — ABNORMAL LOW (ref 150–400)
RBC: 2.91 MIL/uL — ABNORMAL LOW (ref 3.87–5.11)
RDW: 16.6 % — ABNORMAL HIGH (ref 11.5–15.5)
WBC: 5 10*3/uL (ref 4.0–10.5)
nRBC: 0 % (ref 0.0–0.2)

## 2020-03-28 LAB — URINALYSIS, COMPLETE (UACMP) WITH MICROSCOPIC
Bilirubin Urine: NEGATIVE
Glucose, UA: NEGATIVE mg/dL
Ketones, ur: NEGATIVE mg/dL
Leukocytes,Ua: NEGATIVE
Nitrite: NEGATIVE
Protein, ur: >=300 mg/dL — AB
Specific Gravity, Urine: 1.01 (ref 1.005–1.030)
pH: 5 (ref 5.0–8.0)

## 2020-03-28 NOTE — ED Notes (Signed)
Patient continues to sit in chair, NADN, awaiting transport.

## 2020-03-28 NOTE — ED Triage Notes (Signed)
Pt to ED via [facility] from [facility] for chief complaint of multiple falls over the last week. Pt currently being treated for UTI, denies urinary sx.  Denies hitting head with falls. Recently seen for same Alert and oriented, clear speech, denies pain 100% on 4L [place], wears chronically

## 2020-03-28 NOTE — ED Provider Notes (Signed)
[facility] Emergency Department Provider Note   ____________________________________________    I have reviewed the triage vital signs and the nursing notes.   HISTORY  Chief Complaint Fall     HPI Deanna Rice is a 65 y.o. female with past medical history as detailed below presents for evaluation after reported frequent falls over the last week.  Patient denies injury.  She denies head injury.  She states her falls are likely related to her chronic left leg pain.  She denies fevers chills nausea or vomiting.  No abdominal pain.  No chest pain.  No shortness of breath.  Has recently been treated for urinary tract infection and symptoms have improved.  Normal stools  Past Medical History:  Diagnosis Date  . Anemia, unspecified   . CHF (congestive heart failure) ([place])   . Chronic ischemic heart disease, unspecified   . COPD (chronic obstructive pulmonary disease) ([place])   . Dependence on supplemental oxygen   . Diabetes mellitus without complication ([place])   . Gout, unspecified   . Hypertension   . Mixed hyperlipidemia   . Obstructive chronic bronchitis without exacerbation ([place])   . Obstructive sleep apnea syndrome   . Peripheral venous insufficiency   . Primary generalized hypertrophic osteoarthrosis   . Primary hypothyroidism   . Severe recurrent major depression with psychotic features [facility])     Patient Active Problem List   Diagnosis Date Noted  . Chest pain 07/04/2019  . Furuncle of vulva 02/28/2019  . Cellulitis of left lower extremity 12/31/2018  . Diabetic ulcer of left lower leg associated with type 2 diabetes mellitus ([place]) 12/31/2018  . Uncontrolled type 2 diabetes mellitus with hyperglycemia ([place]) 12/31/2018  . Chronic pain syndrome 08/03/2017  . Oxygen dependent 08/03/2017  . Thrombocytopenia ([place]) 09/28/2015  . Anemia in chronic kidney disease 09/28/2015  . Diarrhea 11/28/2014  . AKI (acute kidney injury) ([place]) 11/28/2014   . COPD (chronic obstructive pulmonary disease) ([place]) 11/28/2014  . Uncontrolled type 2 diabetes mellitus with hypoglycemia ([place]) 11/28/2014  . HTN (hypertension) 11/28/2014    Past Surgical History:  Procedure Laterality Date  . TONSILLECTOMY      Prior to Admission medications   Medication Sig Start Date End Date Taking? Authorizing Provider  ADVAIR DISKUS 250-50 MCG/DOSE AEPB INHALE 1 PUFF BY MOUTH INTO THE LUNGS 2 TIMES A DAY 05/27/19   Kendell Bane, NP  allopurinol (ZYLOPRIM) 100 MG tablet TAKE 1 TABLET BY MOUTH DAILY 05/27/19   Kendell Bane, NP  amitriptyline (ELAVIL) 25 MG tablet TAKE 2 TABLETS BY MOUTH AT BEDTIME 05/27/19   Kendell Bane, NP  aspirin EC 81 MG tablet Take 81 mg by mouth daily.    [provider]  Blood Glucose Monitoring Suppl (ONE TOUCH ULTRA 2) w/Device KIT by Does not apply route. Use as directed DX e11.65    [provider]  cephALEXin (KEFLEX) 500 MG capsule Take 1 capsule (500 mg total) by mouth 2 (two) times daily for 7 days. 03/21/20 03/28/20  Blake Divine, MD  clopidogrel (PLAVIX) 75 MG tablet TAKE 1 TABLET BY MOUTH DAILY 05/27/19   Scarboro, Audie Clear, NP  COMBIVENT RESPIMAT 20-100 MCG/ACT AERS respimat INHALE 1 PUFF BY MOUTH INTO THE LUNGS EVERY 6 HOURS. 05/27/19   Kendell Bane, NP  diclofenac sodium (VOLTAREN) 1 % GEL Apply 2 g topically 3 (three) times daily. 03/26/17   Ronnell Freshwater, NP  docusate sodium (COLACE) 100 MG capsule Take 50  mg by mouth daily as needed. Reported on 06/28/2015    [provider]  DULoxetine (CYMBALTA) 60 MG capsule TAKE ONE CAPSULE BY MOUTH TWICE A DAY 05/27/19   Kendell Bane, NP  fluticasone (FLONASE) 50 MCG/ACT nasal spray PLACE 2 SPRAYS INTO BOTH NOSTRILS DAILY 05/27/19   Kendell Bane, NP  folic acid (FOLVITE) 1 MG tablet TAKE 1 TABLET BY MOUTH DAILY 05/27/19   Kendell Bane, NP  glucose blood (ONETOUCH ULTRA) test strip 1 each by Other route 4 (four) times daily. DX e11.65    [provider]  insulin aspart (NOVOLOG FLEXPEN) 100 UNIT/ML FlexPen Use as directed with sliding scale maximum 63 units no more 03/30/19   Scarboro, Audie Clear, NP  Insulin Glargine (BASAGLAR KWIKPEN) 100 UNIT/ML Inject 50 units subcutaneously every day and inject 22 units subcutaneously at bedtime 09/14/19   Kendell Bane, NP  Insulin Pen Needle (GLOBAL EASE INJECT PEN NEEDLES) 32G X 4 MM MISC Use  As directed 12/28/18   Ronnell Freshwater, NP  isosorbide mononitrate (IMDUR) 30 MG 24 hr tablet TAKE 1 TABLET BY MOUTH DAILY 05/27/19   Kendell Bane, NP  lidocaine (XYLOCAINE) 2 % solution Apply small amount to affected areas three to four times daily prn pain. 07/27/19   Ronnell Freshwater, NP  loratadine (CLARITIN) 10 MG tablet TAKE 1 TABLET BY MOUTH DAILY 06/23/19   Kendell Bane, NP  Melatonin 10 MG TABS Take 40 mg by mouth at bedtime.    [provider]  metoprolol succinate (TOPROL-XL) 25 MG 24 hr tablet TAKE 1 TABLET BY MOUTH DAILY 05/27/19   Kendell Bane, NP  Microlet Lancets MISC by Does not apply route 4 (four) times daily. DX e11.65    [provider]  Multiple Vitamins-Minerals (MULTIVITAMIN WITH MINERALS) tablet Take 1 tablet by mouth daily.    [provider]  nitroGLYCERIN (NITROLINGUAL) 0.4 MG/SPRAY spray PLACE 1 SPRAY UNDER THE TONGUE EVERY 5 MINUTES FOR 3 DOSES AS NEEDED FOR CHEST PAIN. 06/19/18   Ronnell Freshwater, NP  nystatin (MYCOSTATIN/NYSTOP) powder Apply 1 application topically 3 (three) times daily. 03/21/20   Blake Divine, MD  omeprazole (PRILOSEC) 40 MG capsule TAKE 1 CAPSULE BY MOUTH DAILY 05/27/19   Kendell Bane, NP  OXYGEN Inhale into the lungs. Inhale 4 liters into lungs continously    [provider]  silver sulfADIAZINE (SILVADENE) 1 % cream APPLY 1 APPLICATION TOPICALLY DAILY 02/15/19   Ronnell Freshwater, NP  simvastatin (ZOCOR) 80 MG tablet Take 80 mg by mouth daily.    [provider]  sulfamethoxazole-trimethoprim  (BACTRIM) 400-80 MG tablet Take 1 tablet by mouth 2 (two) times daily. 07/20/19   Kendell Bane, NP  traMADol (ULTRAM) 50 MG tablet TAKE 1 TABLET BY MOUTH EVERY 12 HOURS ASNEEDED FOR MODERATE PAIN 08/24/19   Kendell Bane, NP  VICTOZA 18 MG/3ML SOPN INJECT 1.8 MG EVERY MORNING 05/27/19   Kendell Bane, NP     Allergies Patient has no known allergies.  Family History  Problem Relation Age of Onset  . Hypertension Mother   . Diabetes Mother   . CAD Mother   . CAD Father   . Diabetes Father   . Hypertension Father   . Hypertension Sister   . Diabetes Sister   . CAD Sister     Social History Social History   Tobacco Use  . Smoking status: Former Research scientist (life sciences)  . Smokeless tobacco: Never  Used  Vaping Use  . Vaping Use: Never used  Substance Use Topics  . Alcohol use: No  . Drug use: No    Review of Systems  Constitutional: No fever/chills Eyes: No visual changes.  ENT: No sore throat. Cardiovascular: Denies chest pain. Respiratory: Denies shortness of breath. Gastrointestinal: No abdominal pain.  No nausea, no vomiting.   Genitourinary: Negative for dysuria. Musculoskeletal: Chronic left leg pain Skin: Negative for rash. Neurological: Negative for headaches or weakness   ____________________________________________   PHYSICAL EXAM:  VITAL SIGNS: ED Triage Vitals  Enc Vitals Group     BP 03/28/20 1004 (!) 149/78     Pulse Rate 03/28/20 1004 80     Resp 03/28/20 1004 20     Temp 03/28/20 1004 98.6 F (37 C)     Temp Source 03/28/20 1004 Oral     SpO2 03/28/20 1004 100 %     Weight 03/28/20 1005 (!) 162.4 kg (358 lb)     Height 03/28/20 1005 1.702 m ('5\' 7"' )     Head Circumference --      Peak Flow --      Pain Score 03/28/20 1004 0     Pain Loc --      Pain Edu? --      Excl. in [place]? --     Constitutional: Alert and oriented. Pleasant and interactive Eyes: Conjunctivae are normal.  Head: Atraumatic. Nose: No congestion/rhinnorhea. Mouth/Throat: Mucous  membranes are moist.   Neck:  Painless ROM Cardiovascular: Normal rate, regular rhythm. Grossly normal heart sounds.  Good peripheral circulation. Respiratory: Normal respiratory effort.  No retractions.  Gastrointestinal: Soft and nontender. No distention.  No CVA tenderness.  Musculoskeletal: No lower extremity tenderness nor edema.  Warm and well perfused Neurologic:  Normal speech and language. No gross focal neurologic deficits are appreciated.  Skin:  Skin is warm, dry and intact. No rash noted. Psychiatric: Mood and affect are normal. Speech and behavior are normal.  ____________________________________________   LABS (all labs ordered are listed, but only abnormal results are displayed)  Labs Reviewed  BASIC METABOLIC PANEL - Abnormal; Notable for the following components:      Result Value   Glucose, Bld 62 (*)    BUN 29 (*)    Creatinine, Ser 1.62 (*)    Calcium 7.8 (*)    GFR, Estimated 35 (*)    All other components within normal limits  CBC - Abnormal; Notable for the following components:   RBC 2.91 (*)    Hemoglobin 8.2 (*)    HCT 26.6 (*)    RDW 16.6 (*)    Platelets 134 (*)    All other components within normal limits  URINALYSIS, COMPLETE (UACMP) WITH MICROSCOPIC - Abnormal; Notable for the following components:   Color, Urine YELLOW (*)    APPearance CLEAR (*)    Hgb urine dipstick SMALL (*)    Protein, ur >=300 (*)    Bacteria, UA RARE (*)    All other components within normal limits   ____________________________________________  EKG  ED ECG REPORT I, Lavonia Drafts, the attending physician, personally viewed and interpreted this ECG.  Date: 03/28/2020  Rhythm: normal sinus rhythm QRS Axis: normal Intervals: normal ST/T Wave abnormalities: normal Narrative Interpretation: no evidence of acute ischemia  ____________________________________________  RADIOLOGY   ____________________________________________   PROCEDURES  Procedure(s)  performed: No  Procedures   Critical Care performed: No ____________________________________________   INITIAL IMPRESSION / ASSESSMENT AND PLAN / ED COURSE  Pertinent labs & imaging results that were available during my care of the patient were reviewed by me and considered in my medical decision making (see chart for details).  Patient well-appearing in no acute distress sent by [facility] for for evaluation due to falls as noted above.  Patient feels this is related to chronic left leg pain.  She denies new neuro deficits.  No headache.  No nausea vomiting abdominal pain or chest pain.  Lab work today is unremarkable, she has chronic anemia which is unchanged  Urinalysis is unremarkable, kidney function is unchanged, no evidence of severe dehydration.  Blood pressure unremarkable, vitals reassuring, exam benign  Appropriate for discharge with outpatient follow-up for further work-up    ____________________________________________   FINAL CLINICAL IMPRESSION(S) / ED DIAGNOSES  Final diagnoses:  Fall, initial encounter        Note:  This document was prepared using Dragon voice recognition software and may include unintentional dictation errors.   Lavonia Drafts, MD 03/28/20 [phone]

## 2020-03-28 NOTE — ED Notes (Signed)
Patient assisted with moving to chair, very unsteady on her feet, able to transfer to chair with minimal difficulty

## 2020-03-28 NOTE — ED Notes (Signed)
[facility]  CALLED  TO  TRANSPORT PT  TO  [facility]  HOUSE

## 2020-03-29 ENCOUNTER — Emergency Department: Payer: Medicare Other

## 2020-03-29 ENCOUNTER — Inpatient Hospital Stay: Payer: Medicare Other

## 2020-03-29 ENCOUNTER — Inpatient Hospital Stay
Admission: EM | Admit: 2020-03-29 | Discharge: 2020-04-13 | DRG: 689 | Disposition: A | Discharge status: Skilled Nursing Facility | Payer: Medicare Other | Source: Skilled Nursing Facility | Attending: Internal Medicine | Admitting: Internal Medicine

## 2020-03-29 DIAGNOSIS — B9620 Unspecified Escherichia coli [E. coli] as the cause of diseases classified elsewhere: Secondary | ICD-10-CM | POA: Diagnosis present

## 2020-03-29 DIAGNOSIS — E782 Mixed hyperlipidemia: Secondary | ICD-10-CM | POA: Diagnosis present

## 2020-03-29 DIAGNOSIS — W19XXXA Unspecified fall, initial encounter: Secondary | ICD-10-CM | POA: Diagnosis present

## 2020-03-29 DIAGNOSIS — I130 Hypertensive heart and chronic kidney disease with heart failure and stage 1 through stage 4 chronic kidney disease, or unspecified chronic kidney disease: Secondary | ICD-10-CM | POA: Diagnosis present

## 2020-03-29 DIAGNOSIS — I509 Heart failure, unspecified: Secondary | ICD-10-CM | POA: Diagnosis present

## 2020-03-29 DIAGNOSIS — J9610 Chronic respiratory failure, unspecified whether with hypoxia or hypercapnia: Secondary | ICD-10-CM | POA: Diagnosis present

## 2020-03-29 DIAGNOSIS — E039 Hypothyroidism, unspecified: Secondary | ICD-10-CM | POA: Diagnosis present

## 2020-03-29 DIAGNOSIS — Z8249 Family history of ischemic heart disease and other diseases of the circulatory system: Secondary | ICD-10-CM

## 2020-03-29 DIAGNOSIS — Z87891 Personal history of nicotine dependence: Secondary | ICD-10-CM | POA: Diagnosis not present

## 2020-03-29 DIAGNOSIS — E11649 Type 2 diabetes mellitus with hypoglycemia without coma: Secondary | ICD-10-CM | POA: Diagnosis present

## 2020-03-29 DIAGNOSIS — R296 Repeated falls: Secondary | ICD-10-CM | POA: Diagnosis present

## 2020-03-29 DIAGNOSIS — Z20822 Contact with and (suspected) exposure to covid-19: Secondary | ICD-10-CM | POA: Diagnosis present

## 2020-03-29 DIAGNOSIS — Z791 Long term (current) use of non-steroidal anti-inflammatories (NSAID): Secondary | ICD-10-CM

## 2020-03-29 DIAGNOSIS — I5032 Chronic diastolic (congestive) heart failure: Secondary | ICD-10-CM | POA: Diagnosis present

## 2020-03-29 DIAGNOSIS — N390 Urinary tract infection, site not specified: Secondary | ICD-10-CM | POA: Diagnosis present

## 2020-03-29 DIAGNOSIS — I259 Chronic ischemic heart disease, unspecified: Secondary | ICD-10-CM | POA: Diagnosis present

## 2020-03-29 DIAGNOSIS — J9611 Chronic respiratory failure with hypoxia: Secondary | ICD-10-CM | POA: Diagnosis present

## 2020-03-29 DIAGNOSIS — Z7951 Long term (current) use of inhaled steroids: Secondary | ICD-10-CM

## 2020-03-29 DIAGNOSIS — Z79899 Other long term (current) drug therapy: Secondary | ICD-10-CM

## 2020-03-29 DIAGNOSIS — G4733 Obstructive sleep apnea (adult) (pediatric): Secondary | ICD-10-CM | POA: Diagnosis present

## 2020-03-29 DIAGNOSIS — D631 Anemia in chronic kidney disease: Secondary | ICD-10-CM | POA: Diagnosis present

## 2020-03-29 DIAGNOSIS — I872 Venous insufficiency (chronic) (peripheral): Secondary | ICD-10-CM | POA: Diagnosis present

## 2020-03-29 DIAGNOSIS — Z7982 Long term (current) use of aspirin: Secondary | ICD-10-CM

## 2020-03-29 DIAGNOSIS — G9341 Metabolic encephalopathy: Secondary | ICD-10-CM | POA: Diagnosis present

## 2020-03-29 DIAGNOSIS — J449 Chronic obstructive pulmonary disease, unspecified: Secondary | ICD-10-CM | POA: Diagnosis present

## 2020-03-29 DIAGNOSIS — I10 Essential (primary) hypertension: Secondary | ICD-10-CM | POA: Diagnosis present

## 2020-03-29 DIAGNOSIS — M109 Gout, unspecified: Secondary | ICD-10-CM | POA: Diagnosis present

## 2020-03-29 DIAGNOSIS — E1122 Type 2 diabetes mellitus with diabetic chronic kidney disease: Secondary | ICD-10-CM | POA: Diagnosis present

## 2020-03-29 DIAGNOSIS — N1831 Chronic kidney disease, stage 3a: Secondary | ICD-10-CM | POA: Diagnosis present

## 2020-03-29 DIAGNOSIS — Z7902 Long term (current) use of antithrombotics/antiplatelets: Secondary | ICD-10-CM

## 2020-03-29 DIAGNOSIS — Z9981 Dependence on supplemental oxygen: Secondary | ICD-10-CM

## 2020-03-29 DIAGNOSIS — I890 Lymphedema, not elsewhere classified: Secondary | ICD-10-CM | POA: Diagnosis present

## 2020-03-29 DIAGNOSIS — Z87440 Personal history of urinary (tract) infections: Secondary | ICD-10-CM

## 2020-03-29 DIAGNOSIS — Z833 Family history of diabetes mellitus: Secondary | ICD-10-CM | POA: Diagnosis not present

## 2020-03-29 DIAGNOSIS — Z794 Long term (current) use of insulin: Secondary | ICD-10-CM | POA: Diagnosis not present

## 2020-03-29 DIAGNOSIS — N183 Chronic kidney disease, stage 3 unspecified: Secondary | ICD-10-CM | POA: Diagnosis present

## 2020-03-29 DIAGNOSIS — D696 Thrombocytopenia, unspecified: Secondary | ICD-10-CM

## 2020-03-29 DIAGNOSIS — E119 Type 2 diabetes mellitus without complications: Secondary | ICD-10-CM

## 2020-03-29 DIAGNOSIS — G894 Chronic pain syndrome: Secondary | ICD-10-CM | POA: Diagnosis present

## 2020-03-29 DIAGNOSIS — M25562 Pain in left knee: Secondary | ICD-10-CM | POA: Diagnosis present

## 2020-03-29 LAB — BASIC METABOLIC PANEL
Anion gap: 12 (ref 5–15)
BUN: 28 mg/dL — ABNORMAL HIGH (ref 8–23)
CO2: 26 mmol/L (ref 22–32)
Calcium: 8.1 mg/dL — ABNORMAL LOW (ref 8.9–10.3)
Chloride: 104 mmol/L (ref 98–111)
Creatinine, Ser: 1.65 mg/dL — ABNORMAL HIGH (ref 0.44–1.00)
GFR, Estimated: 34 mL/min — ABNORMAL LOW (ref >60)
Glucose, Bld: 126 mg/dL — ABNORMAL HIGH (ref 70–99)
Potassium: 4.2 mmol/L (ref 3.5–5.1)
Sodium: 142 mmol/L (ref 135–145)

## 2020-03-29 LAB — URINALYSIS, COMPLETE (UACMP) WITH MICROSCOPIC
Bilirubin Urine: NEGATIVE
Glucose, UA: NEGATIVE mg/dL
Ketones, ur: NEGATIVE mg/dL
Leukocytes,Ua: NEGATIVE
Nitrite: NEGATIVE
Protein, ur: >=300 mg/dL — AB
Specific Gravity, Urine: 1.01 (ref 1.005–1.030)
Squamous Epithelial / HPF: NONE SEEN (ref 0–5)
pH: 5 (ref 5.0–8.0)

## 2020-03-29 LAB — CBC WITH DIFFERENTIAL/PLATELET
Abs Immature Granulocytes: 0.05 10*3/uL (ref 0.00–0.07)
Basophils Absolute: 0 10*3/uL (ref 0.0–0.1)
Basophils Relative: 0 %
Eosinophils Absolute: 0.1 10*3/uL (ref 0.0–0.5)
Eosinophils Relative: 2 %
HCT: 29.5 % — ABNORMAL LOW (ref 36.0–46.0)
Hemoglobin: 9.2 g/dL — ABNORMAL LOW (ref 12.0–15.0)
Immature Granulocytes: 1 %
Lymphocytes Relative: 10 %
Lymphs Abs: 0.7 10*3/uL (ref 0.7–4.0)
MCH: 28.1 pg (ref 26.0–34.0)
MCHC: 31.2 g/dL (ref 30.0–36.0)
MCV: 90.2 fL (ref 80.0–100.0)
Monocytes Absolute: 0.5 10*3/uL (ref 0.1–1.0)
Monocytes Relative: 7 %
Neutro Abs: 6 10*3/uL (ref 1.7–7.7)
Neutrophils Relative %: 80 %
Platelets: 139 10*3/uL — ABNORMAL LOW (ref 150–400)
RBC: 3.27 MIL/uL — ABNORMAL LOW (ref 3.87–5.11)
RDW: 16.8 % — ABNORMAL HIGH (ref 11.5–15.5)
WBC: 7.4 10*3/uL (ref 4.0–10.5)
nRBC: 0 % (ref 0.0–0.2)

## 2020-03-29 LAB — TSH: TSH: 8.112 u[IU]/mL — ABNORMAL HIGH (ref 0.350–4.500)

## 2020-03-29 LAB — HEMOGLOBIN A1C
Hgb A1c MFr Bld: 7.6 % — ABNORMAL HIGH (ref 4.8–5.6)
Mean Plasma Glucose: 171.42 mg/dL

## 2020-03-29 LAB — BRAIN NATRIURETIC PEPTIDE: B Natriuretic Peptide: 1212.5 pg/mL — ABNORMAL HIGH (ref 0.0–100.0)

## 2020-03-29 LAB — TROPONIN I (HIGH SENSITIVITY)
Troponin I (High Sensitivity): 22 ng/L — ABNORMAL HIGH (ref <18)
Troponin I (High Sensitivity): 19 ng/L — ABNORMAL HIGH (ref <18)

## 2020-03-29 LAB — CBG MONITORING, ED
Glucose-Capillary: 147 mg/dL — ABNORMAL HIGH (ref 70–99)
Glucose-Capillary: 234 mg/dL — ABNORMAL HIGH (ref 70–99)

## 2020-03-29 LAB — T4, FREE: Free T4: 1.11 ng/dL (ref 0.61–1.12)

## 2020-03-29 MED ORDER — ONDANSETRON HCL 4 MG/2ML IJ SOLN: 4.0000 mg | Freq: Four times a day (QID) | INTRAMUSCULAR | Status: DC | PRN

## 2020-03-29 MED ORDER — SODIUM CHLORIDE 0.9 % IV SOLN
250.0000 mL | INTRAVENOUS | Status: DC | PRN
  Administered 2020-03-30: 250 mL via INTRAVENOUS

## 2020-03-29 MED ORDER — ATORVASTATIN CALCIUM 20 MG PO TABS
40.0000 mg | ORAL_TABLET | Freq: Every day | ORAL | Status: DC
  Administered 2020-03-29 – 2020-04-13 (×16): 40 mg via ORAL
  Filled 2020-03-29 (×16): qty 2

## 2020-03-29 MED ORDER — SODIUM CHLORIDE 0.9 % IV SOLN
1.0000 g | INTRAVENOUS | Status: AC
  Administered 2020-03-29 – 2020-03-31 (×3): 1 g via INTRAVENOUS
  Filled 2020-03-29: qty 1
  Filled 2020-03-29 (×2): qty 10
  Filled 2020-03-29: qty 1

## 2020-03-29 MED ORDER — NITROGLYCERIN 0.4 MG/SPRAY TL SOLN
1.0000 | Status: DC | PRN
  Filled 2020-03-29: qty 4.9

## 2020-03-29 MED ORDER — DULOXETINE HCL 30 MG PO CPEP
60.0000 mg | ORAL_CAPSULE | Freq: Two times a day (BID) | ORAL | Status: DC
Start: 2020-03-29 — End: 2020-04-13
  Administered 2020-03-29 – 2020-04-13 (×30): 60 mg via ORAL
  Filled 2020-03-29: qty 2
  Filled 2020-03-29: qty 1
  Filled 2020-03-29 (×12): qty 2
  Filled 2020-03-29: qty 1
  Filled 2020-03-29 (×16): qty 2

## 2020-03-29 MED ORDER — IPRATROPIUM-ALBUTEROL 0.5-2.5 (3) MG/3ML IN SOLN
3.0000 mL | Freq: Once | RESPIRATORY_TRACT | Status: AC
  Administered 2020-03-29: 3 mL via RESPIRATORY_TRACT
  Filled 2020-03-29: qty 3

## 2020-03-29 MED ORDER — TRAMADOL HCL 50 MG PO TABS
50.0000 mg | ORAL_TABLET | Freq: Two times a day (BID) | ORAL | Status: DC | PRN
  Administered 2020-03-29: 50 mg via ORAL
  Filled 2020-03-29: qty 1

## 2020-03-29 MED ORDER — MOMETASONE FURO-FORMOTEROL FUM 200-5 MCG/ACT IN AERO
2.0000 | INHALATION_SPRAY | Freq: Two times a day (BID) | RESPIRATORY_TRACT | Status: DC
  Administered 2020-03-29 – 2020-04-13 (×29): 2 via RESPIRATORY_TRACT
  Filled 2020-03-29 (×2): qty 8.8

## 2020-03-29 MED ORDER — DICLOFENAC SODIUM 1 % TD GEL
2.0000 g | Freq: Three times a day (TID) | TRANSDERMAL | Status: DC
  Administered 2020-03-29 – 2020-04-02 (×7): 2 g via TOPICAL
  Filled 2020-03-29 (×2): qty 100

## 2020-03-29 MED ORDER — ADULT MULTIVITAMIN W/MINERALS CH
1.0000 | ORAL_TABLET | Freq: Every day | ORAL | Status: DC
  Administered 2020-03-29 – 2020-04-13 (×16): 1 via ORAL
  Filled 2020-03-29 (×16): qty 1

## 2020-03-29 MED ORDER — METOPROLOL SUCCINATE ER 25 MG PO TB24
25.0000 mg | ORAL_TABLET | Freq: Every day | ORAL | Status: DC
  Administered 2020-03-29 – 2020-04-10 (×13): 25 mg via ORAL
  Filled 2020-03-29 (×13): qty 1

## 2020-03-29 MED ORDER — ENOXAPARIN SODIUM 100 MG/ML ~~LOC~~ SOLN
0.5000 mg/kg | SUBCUTANEOUS | Status: DC
  Administered 2020-03-29 – 2020-04-12 (×15): 90 mg via SUBCUTANEOUS
  Filled 2020-03-29 (×18): qty 1

## 2020-03-29 MED ORDER — SODIUM CHLORIDE 0.9% FLUSH
3.0000 mL | Freq: Two times a day (BID) | INTRAVENOUS | Status: DC
  Administered 2020-03-29 – 2020-04-12 (×29): 3 mL via INTRAVENOUS

## 2020-03-29 MED ORDER — ALLOPURINOL 100 MG PO TABS
100.0000 mg | ORAL_TABLET | Freq: Every day | ORAL | Status: DC
Start: 2020-03-29 — End: 2020-04-13
  Administered 2020-03-29 – 2020-04-13 (×16): 100 mg via ORAL
  Filled 2020-03-29 (×18): qty 1

## 2020-03-29 MED ORDER — SODIUM CHLORIDE 0.9% FLUSH: 3.0000 mL | INTRAVENOUS | Status: DC | PRN

## 2020-03-29 MED ORDER — ENOXAPARIN SODIUM 40 MG/0.4ML ~~LOC~~ SOLN: 40.0000 mg | SUBCUTANEOUS | Status: DC

## 2020-03-29 MED ORDER — ASPIRIN EC 81 MG PO TBEC
81.0000 mg | DELAYED_RELEASE_TABLET | Freq: Every day | ORAL | Status: DC
  Administered 2020-03-29 – 2020-04-13 (×15): 81 mg via ORAL
  Filled 2020-03-29 (×15): qty 1

## 2020-03-29 MED ORDER — FUROSEMIDE 10 MG/ML IJ SOLN
40.0000 mg | Freq: Once | INTRAMUSCULAR | Status: AC
  Administered 2020-03-29: 40 mg via INTRAVENOUS
  Filled 2020-03-29: qty 4

## 2020-03-29 MED ORDER — FLUTICASONE PROPIONATE 50 MCG/ACT NA SUSP
2.0000 | Freq: Every day | NASAL | Status: DC
  Administered 2020-03-31 – 2020-04-13 (×14): 2 via NASAL
  Filled 2020-03-29 (×2): qty 16

## 2020-03-29 MED ORDER — INSULIN ASPART 100 UNIT/ML ~~LOC~~ SOLN
0.0000 [IU] | Freq: Three times a day (TID) | SUBCUTANEOUS | Status: DC
  Administered 2020-03-30 (×3): 7 [IU] via SUBCUTANEOUS
  Administered 2020-04-01: 3 [IU] via SUBCUTANEOUS
  Administered 2020-04-01: 4 [IU] via SUBCUTANEOUS
  Administered 2020-04-01: 11 [IU] via SUBCUTANEOUS
  Filled 2020-03-29 (×6): qty 1

## 2020-03-29 MED ORDER — SODIUM CHLORIDE 0.9 % IV BOLUS
1000.0000 mL | Freq: Once | INTRAVENOUS | Status: AC
  Administered 2020-03-29: 1000 mL via INTRAVENOUS

## 2020-03-29 MED ORDER — MELATONIN 5 MG PO TABS
10.0000 mg | ORAL_TABLET | Freq: Every day | ORAL | Status: DC
Start: 2020-03-29 — End: 2020-04-13
  Administered 2020-03-29 – 2020-04-12 (×14): 10 mg via ORAL
  Filled 2020-03-29 (×15): qty 2

## 2020-03-29 MED ORDER — IOHEXOL 350 MG/ML SOLN: 75.0000 mL | Freq: Once | INTRAVENOUS | Status: DC | PRN

## 2020-03-29 MED ORDER — IOHEXOL 350 MG/ML SOLN
60.0000 mL | Freq: Once | INTRAVENOUS | Status: AC | PRN
  Administered 2020-03-29: 60 mL via INTRAVENOUS

## 2020-03-29 MED ORDER — LORATADINE 10 MG PO TABS
10.0000 mg | ORAL_TABLET | Freq: Every day | ORAL | Status: DC
  Administered 2020-03-30 – 2020-04-13 (×15): 10 mg via ORAL
  Filled 2020-03-29 (×15): qty 1

## 2020-03-29 MED ORDER — ONDANSETRON HCL 4 MG PO TABS: 4.0000 mg | ORAL_TABLET | Freq: Four times a day (QID) | ORAL | Status: DC | PRN

## 2020-03-29 MED ORDER — ISOSORBIDE MONONITRATE ER 30 MG PO TB24
30.0000 mg | ORAL_TABLET | Freq: Every day | ORAL | Status: DC
Start: 2020-03-29 — End: 2020-03-30
  Administered 2020-03-29 – 2020-03-30 (×2): 30 mg via ORAL
  Filled 2020-03-29 (×2): qty 1

## 2020-03-29 NOTE — ED Notes (Signed)
Patient provided with snack

## 2020-03-29 NOTE — Progress Notes (Signed)
PHARMACIST - PHYSICIAN COMMUNICATION  CONCERNING:  Enoxaparin (Lovenox) for DVT Prophylaxis    RECOMMENDATION: Patient was prescribed enoxaparin 40mg  q24 hours for VTE prophylaxis.   Filed Weights   03/29/20 0626  Weight: (!) 179.2 kg (395 lb 1 oz)    Body mass index is 61.88 kg/m.  Estimated Creatinine Clearance: 58.3 mL/min (A) (by C-G formula based on SCr of 1.65 mg/dL (H)).   Based on [facility] policy patient is candidate for enoxaparin 0.5mg /kg TBW SQ every 24 hours based on BMI being >30.  DESCRIPTION: Pharmacy has adjusted enoxaparin dose per [facility] policy.  Patient is now receiving enoxaparin 90 mg every 24 hours    [facility] 03/29/2020 1:39 PM

## 2020-03-29 NOTE — ED Notes (Signed)
Pt having periods of visual hallucinations. While RN was administering medications, pt asked if there was a "person sleeping up there" while pointing to label maker. RN assured that this was the label maker and that this room was just hers and no other people would be sleeping in there. Pt also asked another RN if she could see things that were moving in room.   Per report from previous nurse, pt has had hallucinations previously. Pt is NAD and is continuing to be monitored on o2, BP, and cardiac monitors at this time.

## 2020-03-29 NOTE — ED Provider Notes (Addendum)
Assumed care from Dr. Scotty Court at 7 AM. Briefly, the patient is a 65 y.o. female with PMHx of  has a past medical history of Anemia, unspecified, CHF (congestive heart failure) (HCC), Chronic ischemic heart disease, unspecified, COPD (chronic obstructive pulmonary disease) (HCC), Dependence on supplemental oxygen, Diabetes mellitus without complication (HCC), Gout, unspecified, Hypertension, Mixed hyperlipidemia, Obstructive chronic bronchitis without exacerbation (HCC), Obstructive sleep apnea syndrome, Peripheral venous insufficiency, Primary generalized hypertrophic osteoarthrosis, Primary hypothyroidism, and Severe recurrent major depression with psychotic features (HCC). here with recurrent falls. Imaging is negative. Labs are unremarkable. Pt has been seen multiple times for this recently. UA from prior visit with possible UTI but this has been appropriately treated. No signs of sepsis.   Labs Reviewed  BASIC METABOLIC PANEL - Abnormal; Notable for the following components:      Result Value   Glucose, Bld 126 (*)    BUN 28 (*)    Creatinine, Ser 1.65 (*)    Calcium 8.1 (*)    GFR, Estimated 34 (*)    All other components within normal limits  CBC WITH DIFFERENTIAL/PLATELET - Abnormal; Notable for the following components:   RBC 3.27 (*)    Hemoglobin 9.2 (*)    HCT 29.5 (*)    RDW 16.8 (*)    Platelets 139 (*)    All other components within normal limits  URINALYSIS, COMPLETE (UACMP) WITH MICROSCOPIC    Course of Care: -Pt remains HDS. Breathing is at baseline. Cardiomegaly noted on CXR with vascular congestion, and BNP elevated here. Will give lasix. Otherwise, pt noted to be intermittently confused in ED with auditory hallucinations. Concern for recurrent falls 2/2 encephalopathy related to recent UTI, possible concomitant CHF, and chronic COPD. Will admit for treatment and monitoring. 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 Of  note, carotids occluded on MR so will obtain CT - no stroke like deficits currently and do not feel this relates to her current presentation.       Shaune Pollack, MD 03/29/20 3300    Shaune Pollack, MD 03/29/20 [phone]

## 2020-03-29 NOTE — ED Provider Notes (Signed)
[facility] Emergency Department Provider Note  ____________________________________________  Time seen: Approximately 7:08 AM  I have reviewed the triage vital signs and the nursing notes.   HISTORY  Chief Complaint Fall    HPI Deanna Rice is a 65 y.o. female with past history of CHF COPD IDDM hypertension morbid obesity lymphedema who comes the ED from [place] due to a fall.  Patient was in her usual state of health, fell and hit the left side of her head.  She is on Plavix but no anticoagulants.  Denies neck pain.  No loss of consciousness vision change paresthesias or motor weakness.  No vomiting.  Also complains of left knee pain which is chronic for her.  She reports that she tends to fall because of the knee pain and leg swelling which are chronic.      Past Medical History:  Diagnosis Date  . Anemia, unspecified   . CHF (congestive heart failure) ([place])   . Chronic ischemic heart disease, unspecified   . COPD (chronic obstructive pulmonary disease) ([place])   . Dependence on supplemental oxygen   . Diabetes mellitus without complication ([place])   . Gout, unspecified   . Hypertension   . Mixed hyperlipidemia   . Obstructive chronic bronchitis without exacerbation ([place])   . Obstructive sleep apnea syndrome   . Peripheral venous insufficiency   . Primary generalized hypertrophic osteoarthrosis   . Primary hypothyroidism   . Severe recurrent major depression with psychotic features [facility])      Patient Active Problem List   Diagnosis Date Noted  . Chest pain 07/04/2019  . Furuncle of vulva 02/28/2019  . Cellulitis of left lower extremity 12/31/2018  . Diabetic ulcer of left lower leg associated with type 2 diabetes mellitus ([place]) 12/31/2018  . Uncontrolled type 2 diabetes mellitus with hyperglycemia ([place]) 12/31/2018  . Chronic pain syndrome 08/03/2017  . Oxygen dependent 08/03/2017  . Thrombocytopenia ([place]) 09/28/2015  . Anemia in  chronic kidney disease 09/28/2015  . Diarrhea 11/28/2014  . AKI (acute kidney injury) ([place]) 11/28/2014  . COPD (chronic obstructive pulmonary disease) ([place]) 11/28/2014  . Uncontrolled type 2 diabetes mellitus with hypoglycemia ([place]) 11/28/2014  . HTN (hypertension) 11/28/2014     Past Surgical History:  Procedure Laterality Date  . TONSILLECTOMY       Prior to Admission medications   Medication Sig Start Date End Date Taking? Authorizing Provider  ADVAIR DISKUS 250-50 MCG/DOSE AEPB INHALE 1 PUFF BY MOUTH INTO THE LUNGS 2 TIMES A DAY 05/27/19   Kendell Bane, NP  allopurinol (ZYLOPRIM) 100 MG tablet TAKE 1 TABLET BY MOUTH DAILY 05/27/19   Kendell Bane, NP  amitriptyline (ELAVIL) 25 MG tablet TAKE 2 TABLETS BY MOUTH AT BEDTIME 05/27/19   Kendell Bane, NP  aspirin EC 81 MG tablet Take 81 mg by mouth daily.    [provider]  Blood Glucose Monitoring Suppl (ONE TOUCH ULTRA 2) w/Device KIT by Does not apply route. Use as directed DX e11.65    [provider]  clopidogrel (PLAVIX) 75 MG tablet TAKE 1 TABLET BY MOUTH DAILY 05/27/19   Scarboro, Audie Clear, NP  COMBIVENT RESPIMAT 20-100 MCG/ACT AERS respimat INHALE 1 PUFF BY MOUTH INTO THE LUNGS EVERY 6 HOURS. 05/27/19   Kendell Bane, NP  diclofenac sodium (VOLTAREN) 1 % GEL Apply 2 g topically 3 (three) times daily. 03/26/17   Ronnell Freshwater, NP  docusate sodium (COLACE) 100 MG capsule Take 50 mg  by mouth daily as needed. Reported on 06/28/2015    [provider]  DULoxetine (CYMBALTA) 60 MG capsule TAKE ONE CAPSULE BY MOUTH TWICE A DAY 05/27/19   Kendell Bane, NP  fluticasone (FLONASE) 50 MCG/ACT nasal spray PLACE 2 SPRAYS INTO BOTH NOSTRILS DAILY 05/27/19   Kendell Bane, NP  folic acid (FOLVITE) 1 MG tablet TAKE 1 TABLET BY MOUTH DAILY 05/27/19   Kendell Bane, NP  glucose blood (ONETOUCH ULTRA) test strip 1 each by Other route 4 (four) times daily. DX e11.65    [provider]  insulin aspart  (NOVOLOG FLEXPEN) 100 UNIT/ML FlexPen Use as directed with sliding scale maximum 63 units no more 03/30/19   Scarboro, Audie Clear, NP  Insulin Glargine (BASAGLAR KWIKPEN) 100 UNIT/ML Inject 50 units subcutaneously every day and inject 22 units subcutaneously at bedtime 09/14/19   Kendell Bane, NP  Insulin Pen Needle (GLOBAL EASE INJECT PEN NEEDLES) 32G X 4 MM MISC Use  As directed 12/28/18   Ronnell Freshwater, NP  isosorbide mononitrate (IMDUR) 30 MG 24 hr tablet TAKE 1 TABLET BY MOUTH DAILY 05/27/19   Kendell Bane, NP  lidocaine (XYLOCAINE) 2 % solution Apply small amount to affected areas three to four times daily prn pain. 07/27/19   Ronnell Freshwater, NP  loratadine (CLARITIN) 10 MG tablet TAKE 1 TABLET BY MOUTH DAILY 06/23/19   Kendell Bane, NP  Melatonin 10 MG TABS Take 40 mg by mouth at bedtime.    [provider]  metoprolol succinate (TOPROL-XL) 25 MG 24 hr tablet TAKE 1 TABLET BY MOUTH DAILY 05/27/19   Kendell Bane, NP  Microlet Lancets MISC by Does not apply route 4 (four) times daily. DX e11.65    [provider]  Multiple Vitamins-Minerals (MULTIVITAMIN WITH MINERALS) tablet Take 1 tablet by mouth daily.    [provider]  nitroGLYCERIN (NITROLINGUAL) 0.4 MG/SPRAY spray PLACE 1 SPRAY UNDER THE TONGUE EVERY 5 MINUTES FOR 3 DOSES AS NEEDED FOR CHEST PAIN. 06/19/18   Ronnell Freshwater, NP  nystatin (MYCOSTATIN/NYSTOP) powder Apply 1 application topically 3 (three) times daily. 03/21/20   Blake Divine, MD  omeprazole (PRILOSEC) 40 MG capsule TAKE 1 CAPSULE BY MOUTH DAILY 05/27/19   Kendell Bane, NP  OXYGEN Inhale into the lungs. Inhale 4 liters into lungs continously    [provider]  silver sulfADIAZINE (SILVADENE) 1 % cream APPLY 1 APPLICATION TOPICALLY DAILY 02/15/19   Ronnell Freshwater, NP  simvastatin (ZOCOR) 80 MG tablet Take 80 mg by mouth daily.    [provider]  sulfamethoxazole-trimethoprim (BACTRIM) 400-80 MG tablet Take 1  tablet by mouth 2 (two) times daily. 07/20/19   Kendell Bane, NP  traMADol (ULTRAM) 50 MG tablet TAKE 1 TABLET BY MOUTH EVERY 12 HOURS ASNEEDED FOR MODERATE PAIN 08/24/19   Kendell Bane, NP  VICTOZA 18 MG/3ML SOPN INJECT 1.8 MG EVERY MORNING 05/27/19   Kendell Bane, NP     Allergies Patient has no known allergies.   Family History  Problem Relation Age of Onset  . Hypertension Mother   . Diabetes Mother   . CAD Mother   . CAD Father   . Diabetes Father   . Hypertension Father   . Hypertension Sister   . Diabetes Sister   . CAD Sister     Social History Social History   Tobacco Use  . Smoking status: Former Research scientist (life sciences)  . Smokeless tobacco: Never  Used  Vaping Use  . Vaping Use: Never used  Substance Use Topics  . Alcohol use: No  . Drug use: No    Review of Systems  Constitutional:   No fever or chills.  ENT:   No sore throat. No rhinorrhea. Cardiovascular:   No chest pain or syncope. Respiratory:   No dyspnea or cough. Gastrointestinal:   Negative for abdominal pain, vomiting and diarrhea.  Musculoskeletal:   Negative for focal pain or swelling All other systems reviewed and are negative except as documented above in ROS and HPI.  ____________________________________________   PHYSICAL EXAM:  VITAL SIGNS: ED Triage Vitals  Enc Vitals Group     BP 03/29/20 0627 (!) 176/81     Pulse Rate 03/29/20 0627 85     Resp 03/29/20 0627 (!) 23     Temp 03/29/20 0627 97.6 F (36.4 C)     Temp Source 03/29/20 0627 Oral     SpO2 03/29/20 0627 100 %     Weight 03/29/20 0626 (!) 395 lb 1 oz (179.2 kg)     Height 03/29/20 0626 _0  (1.702 m)     Head Circumference --      Peak Flow --      Pain Score 03/29/20 0625 7     Pain Loc --      Pain Edu? --      Excl. in [place]? --     Vital signs reviewed, nursing assessments reviewed.   Constitutional:   Alert and oriented. Non-toxic appearance. Eyes:   Conjunctivae are normal. EOMI. PERRL. ENT      Head:    Normocephalic and atraumatic.  No hematoma or laceration      Nose:   Normal      Mouth/Throat: Dry mucous membranes      Neck:   No meningismus. Full ROM.  No midline tenderness Hematological/Lymphatic/Immunilogical:   No cervical lymphadenopathy. Cardiovascular:   RRR. Symmetric bilateral radial and DP pulses.  No murmurs. Cap refill less than 2 seconds. Respiratory:   Normal respiratory effort without tachypnea/retractions. Breath sounds are clear and equal bilaterally. No wheezes/rales/rhonchi. Gastrointestinal:   Soft and nontender. Non distended. There is no CVA tenderness.  No rebound, rigidity, or guarding. Musculoskeletal:   Normal range of motion in all extremities.  Large chronic lymphedema bilateral lower extremities.  There is tenderness over the left knee without bony point tenderness or obvious deformity. Neurologic:   Normal speech and language.  Motor grossly intact. No acute focal neurologic deficits are appreciated.  Skin:    Skin is warm, dry and intact.  No laceration.  No rash noted.  No petechiae, purpura, or bullae.  ____________________________________________    LABS (pertinent positives/negatives) (all labs ordered are listed, but only abnormal results are displayed) Labs Reviewed  CBC WITH DIFFERENTIAL/PLATELET - Abnormal; Notable for the following components:      Result Value   RBC 3.27 (*)    Hemoglobin 9.2 (*)    HCT 29.5 (*)    RDW 16.8 (*)    Platelets 139 (*)    All other components within normal limits  BASIC METABOLIC PANEL  URINALYSIS, COMPLETE (UACMP) WITH MICROSCOPIC   ____________________________________________   EKG  Interpreted by me Sinus rhythm rate of 80, normal axis, prolonged QTC of 535 ms.  Poor R wave progression.  Normal ST segments and T waves.  ____________________________________________    RADIOLOGY  No results  found.  ____________________________________________   PROCEDURES Procedures  ____________________________________________  DIFFERENTIAL  DIAGNOSIS   Intracranial hemorrhage, knee fracture, dehydration, electrolyte abnormality  CLINICAL IMPRESSION / ASSESSMENT AND PLAN / ED COURSE  Medications ordered in the ED: Medications  sodium chloride 0.9 % bolus 1,000 mL (1,000 mLs Intravenous New Bag/Given 03/29/20 8063)    Pertinent labs & imaging results that were available during my care of the patient were reviewed by me and considered in my medical decision making (see chart for details).  Deanna Rice was evaluated in Emergency Department on 03/29/2020 for the symptoms described in the history of present illness. She was evaluated in the context of the global COVID-19 pandemic, which necessitated consideration that the patient might be at risk for infection with the SARS-CoV-2 virus that causes COVID-19. Institutional protocols and algorithms that pertain to the evaluation of patients at risk for COVID-19 are in a state of rapid change based on information released by regulatory bodies including the CDC and federal and state organizations. These policies and algorithms were followed during the patient's care in the ED.   Patient presents for recurrent falls which appear to be mechanical due to lymphedema and chronic leg pain.  Vital signs unremarkable.  Mental status is normal.  Will obtain CT scan of the head, chest x-ray knee x-ray, lab panel.  If reassuring, I think patient will be stable for discharge back to her skilled nursing facility.      ____________________________________________   FINAL CLINICAL IMPRESSION(S) / ED DIAGNOSES    Final diagnoses:  Fall, initial encounter  Lymphedema  Morbid obesity ([place])  Type 2 diabetes mellitus without complication, with long-term current use of insulin [facility])     ED Discharge Orders    None      Portions of this note were  generated with dragon dictation software. Dictation errors may occur despite best attempts at proofreading.   Carrie Mew, MD 03/29/20 [phone]

## 2020-03-29 NOTE — ED Notes (Signed)
Pt taken to MRI  

## 2020-03-29 NOTE — ED Notes (Signed)
Admitting provider at bedside.

## 2020-03-29 NOTE — ED Notes (Signed)
Report given to [facility] on patient status.

## 2020-03-29 NOTE — ED Notes (Addendum)
Pt given phone and water per request at this time. RN made aware.

## 2020-03-29 NOTE — TOC Initial Note (Signed)
Transition of Care [facility]) - Initial/Assessment Note    Patient Details  Name: Deanna Rice MRN: [id number] Date of Birth: 01/01/1956  [facility]) CM/SW Contact:    Marina Goodell Phone Number:  [phone] 03/29/2020, 3:41 PM  Clinical Narrative:                  Patient presents to [facility] due to multiple falls at ALF. CSW spoke with patient and explained role of TOC in patient care. Patient stated she is able to perform all ADLs without assistance but she is sometimes incontinent and uses walker and or wheelchair to ambulate.  Patient stated she does have a caregiver usually from 7-3.  Patient stated she is able to received and take her medications without issues, patient's current PCP is Dr. Aleene Davidson. Patient stated she has fallen in the past but not so many times.  Patient stated "it is like someone flips a switch and I fall." Patient is okay with SNF placement but would prefer SNF in [place], because her sister Wiliam Ke [phone] lives there.  CSW explained SNF and HH placement process and that I would not be abel to guarantee specific placement.  Patient verbalized understanding.  CSW called [facility] [phone] and spoke with Crystal with update on patient.    Expected Discharge Plan: Skilled Nursing Facility Barriers to Discharge: Continued Medical Work up   Patient Goals and CMS Choice Patient states their goals for this hospitalization and ongoing recovery are:: Patient is okay with SNF palcement but would like placement in [place].      Expected Discharge Plan and Services Expected Discharge Plan: Skilled Nursing Facility In-house Referral: Clinical Social Work   Post Acute Care Choice: Skilled Nursing Facility Living arrangements for the past 2 months: Assisted Living Facility [facility])                                      Prior Living Arrangements/Services Living arrangements for the past 2 months:  Assisted Living Facility [employer] forces operational officer) Lives with:: Facility Resident Patient language and need for interpreter reviewed:: Yes Do you feel safe going back to the place where you live?: Yes      Need for Family Participation in Patient Care: No (Comment) Care giver support system in place?: Yes (comment)   Criminal Activity/Legal Involvement Pertinent to Current Situation/Hospitalization: No - Comment as needed  Activities of Daily Living      Permission Sought/Granted Permission sought to share information with : Family Supports Permission granted to share information with : Yes, Verbal Permission Granted  Share Information with NAME: Wiliam Ke Sister     [phone]  Permission granted to share info w AGENCY: [facility]     Permission granted to share info w Contact Information: Ephriam Knuckles (covers AL & Memory Care) at [phone].  Emotional Assessment Appearance:: Appears stated age Attitude/Demeanor/Rapport: Engaged Affect (typically observed): Stable Orientation: : Oriented to Self,Oriented to Place,Oriented to  Time,Oriented to Situation Alcohol / Substance Use: Not Applicable Psych Involvement: No (comment)  Admission diagnosis:  Acute metabolic encephalopathy [G93.41] Patient Active Problem List   Diagnosis Date Noted  . Acute metabolic encephalopathy 03/29/2020  . Primary hypothyroidism   . Type 2 diabetes mellitus with stage 3 chronic kidney disease (HCC)   . Acute lower UTI   . Chest pain 07/04/2019  . Furuncle of vulva 02/28/2019  . Cellulitis of  left lower extremity 12/31/2018  . Diabetic ulcer of left lower leg associated with type 2 diabetes mellitus (HCC) 12/31/2018  . Uncontrolled type 2 diabetes mellitus with hyperglycemia (HCC) 12/31/2018  . Chronic pain syndrome 08/03/2017  . Oxygen dependent 08/03/2017  . Thrombocytopenia (HCC) 09/28/2015  . Anemia in chronic kidney disease 09/28/2015  . Diarrhea 11/28/2014  . AKI (acute kidney injury)  (HCC) 11/28/2014  . COPD (chronic obstructive pulmonary disease) (HCC) 11/28/2014  . Uncontrolled type 2 diabetes mellitus with hypoglycemia (HCC) 11/28/2014  . HTN (hypertension) 11/28/2014   PCP:  Lyndon Code, MD Pharmacy:   [address] [place], [place] - [address] [place] [address] Phone: [phone] Fax: [phone]  MED[facility] [address] [place], [place] - [address] [place] [address] Phone: [phone] Fax: [phone]  [facility] [place], [place] - [address] [place] [address] Phone: [phone] Fax: [phone]     Social Determinants of Health (SDOH) Interventions    Readmission Risk Interventions No flowsheet data found.

## 2020-03-29 NOTE — ED Triage Notes (Signed)
Pt arrives EMS from [name]. Pt lost her balance and fell and hit the top of her head. Facility reports this was her 6th fall in a week.

## 2020-03-29 NOTE — H&P (Signed)
History and Physical    Deanna Rice GBT:[id number] DOB: 1956/01/12 DOA: 03/29/2020  PCP: Lavera Guise, MD   Patient coming from: [place] have personally briefly reviewed patient's old medical records in [place]  Chief Complaint: Fall  HPI: Deanna Rice is a 65 y.o. female with medical history significant for morbid obesity, diabetes mellitus, COPD with chronic respiratory failure, venous insufficiency, hypothyroidism, depression and CHF who was brought into the ER for evaluation following a fall.  She usually ambulates with an assist device and occasionally is in a wheelchair.  She states that she fell because of pain in her knee and denies any loss of consciousness, no dizziness or lightheadedness.  She denies having any nausea, no vomiting, no diarrhea or abdominal pain. Patient has been seen in the ER multiple times for recurrent falls. While in the ER she was noted to have visual hallucinations.  She is oriented to person and time but not to place which is not her baseline.  She complains of dysuria and frequency. She denies having any fever, no chills, no cough, no headache, no chest pain, no diaphoresis, no palpitations, no lower extremity swelling, no lower extremity weakness Arterial blood gas 7.36/49/27/37 Sodium 142, potassium 4.2, chloride 104, bicarb 26, glucose 126, BUN 28, creatinine 9.65, calcium 8.1, BNP 1212, white count 7.4 with a left shift, hemoglobin 9.2, hematocrit 29.5, MCV 90.2, RDW 16.8, platelet count 139, TSH 8.1 CT scan of the head without contrast shows no evidence of intracranial injury.  Remote left frontal infarct. Chest x-ray shows cardiomegaly with mild pulmonary venous congestion.  Low lung volumes with bibasilar atelectasis. MRI of the left knee shows postsurgical changes noted above the femur.  Severe degenerative changes again noted about the left knee. MRI of the brain without contrast shows encephalomalacia and gliosis in the  paramedial left frontal and parietal regions, suggestive of prior watershed infarcts.  Mild chronic microangiopathic changes.   ED Course: Patient is a 65 year old female who resides in a skilled nursing facility and was sent to the ER for evaluation of frequent falls.  Patient noted to have visual hallucinations in the ER and is oriented to person and time not to place which is not her baseline.  Patient has pyuria recent urine cultures that yielded E. Coli.  She will be admitted to the hospital for further evaluation.  Review of Systems: As per HPI otherwise all systems reviewed and negative.    Past Medical History:  Diagnosis Date  . Anemia, unspecified   . CHF (congestive heart failure) ([place])   . Chronic ischemic heart disease, unspecified   . COPD (chronic obstructive pulmonary disease) ([place])   . Dependence on supplemental oxygen   . Diabetes mellitus without complication ([place])   . Gout, unspecified   . Hypertension   . Mixed hyperlipidemia   . Obstructive chronic bronchitis without exacerbation ([place])   . Obstructive sleep apnea syndrome   . Peripheral venous insufficiency   . Primary generalized hypertrophic osteoarthrosis   . Primary hypothyroidism   . Severe recurrent major depression with psychotic features [facility])     Past Surgical History:  Procedure Laterality Date  . TONSILLECTOMY       reports that she has quit smoking. She has never used smokeless tobacco. She reports that she does not drink alcohol and does not use drugs.  No Known Allergies  Family History  Problem Relation Age of Onset  . Hypertension Mother   . Diabetes  Mother   . CAD Mother   . CAD Father   . Diabetes Father   . Hypertension Father   . Hypertension Sister   . Diabetes Sister   . CAD Sister      Prior to Admission medications   Medication Sig Start Date End Date Taking? Authorizing Provider  ADVAIR DISKUS 250-50 MCG/DOSE AEPB INHALE 1 PUFF BY MOUTH INTO THE LUNGS 2 TIMES A DAY  05/27/19   Kendell Bane, NP  allopurinol (ZYLOPRIM) 100 MG tablet TAKE 1 TABLET BY MOUTH DAILY 05/27/19   Kendell Bane, NP  amitriptyline (ELAVIL) 25 MG tablet TAKE 2 TABLETS BY MOUTH AT BEDTIME 05/27/19   Kendell Bane, NP  aspirin EC 81 MG tablet Take 81 mg by mouth daily.    [provider]  Blood Glucose Monitoring Suppl (ONE TOUCH ULTRA 2) w/Device KIT by Does not apply route. Use as directed DX e11.65    [provider]  clopidogrel (PLAVIX) 75 MG tablet TAKE 1 TABLET BY MOUTH DAILY 05/27/19   Scarboro, Audie Clear, NP  COMBIVENT RESPIMAT 20-100 MCG/ACT AERS respimat INHALE 1 PUFF BY MOUTH INTO THE LUNGS EVERY 6 HOURS. 05/27/19   Kendell Bane, NP  diclofenac sodium (VOLTAREN) 1 % GEL Apply 2 g topically 3 (three) times daily. 03/26/17   Ronnell Freshwater, NP  docusate sodium (COLACE) 100 MG capsule Take 50 mg by mouth daily as needed. Reported on 06/28/2015    [provider]  DULoxetine (CYMBALTA) 60 MG capsule TAKE ONE CAPSULE BY MOUTH TWICE A DAY 05/27/19   Kendell Bane, NP  fluticasone (FLONASE) 50 MCG/ACT nasal spray PLACE 2 SPRAYS INTO BOTH NOSTRILS DAILY 05/27/19   Kendell Bane, NP  folic acid (FOLVITE) 1 MG tablet TAKE 1 TABLET BY MOUTH DAILY 05/27/19   Kendell Bane, NP  glucose blood (ONETOUCH ULTRA) test strip 1 each by Other route 4 (four) times daily. DX e11.65    [provider]  insulin aspart (NOVOLOG FLEXPEN) 100 UNIT/ML FlexPen Use as directed with sliding scale maximum 63 units no more 03/30/19   Scarboro, Audie Clear, NP  Insulin Glargine (BASAGLAR KWIKPEN) 100 UNIT/ML Inject 50 units subcutaneously every day and inject 22 units subcutaneously at bedtime 09/14/19   Kendell Bane, NP  Insulin Pen Needle (GLOBAL EASE INJECT PEN NEEDLES) 32G X 4 MM MISC Use  As directed 12/28/18   Ronnell Freshwater, NP  isosorbide mononitrate (IMDUR) 30 MG 24 hr tablet TAKE 1 TABLET BY MOUTH DAILY 05/27/19   Kendell Bane, NP  lidocaine (XYLOCAINE) 2 % solution  Apply small amount to affected areas three to four times daily prn pain. 07/27/19   Ronnell Freshwater, NP  loratadine (CLARITIN) 10 MG tablet TAKE 1 TABLET BY MOUTH DAILY 06/23/19   Kendell Bane, NP  Melatonin 10 MG TABS Take 40 mg by mouth at bedtime.    [provider]  metoprolol succinate (TOPROL-XL) 25 MG 24 hr tablet TAKE 1 TABLET BY MOUTH DAILY 05/27/19   Kendell Bane, NP  Microlet Lancets MISC by Does not apply route 4 (four) times daily. DX e11.65    [provider]  Multiple Vitamins-Minerals (MULTIVITAMIN WITH MINERALS) tablet Take 1 tablet by mouth daily.    [provider]  nitroGLYCERIN (NITROLINGUAL) 0.4 MG/SPRAY spray PLACE 1 SPRAY UNDER THE TONGUE EVERY 5 MINUTES FOR 3 DOSES AS NEEDED FOR CHEST PAIN. 06/19/18   Ronnell Freshwater, NP  nystatin (  MYCOSTATIN/NYSTOP) powder Apply 1 application topically 3 (three) times daily. 03/21/20   Blake Divine, MD  omeprazole (PRILOSEC) 40 MG capsule TAKE 1 CAPSULE BY MOUTH DAILY 05/27/19   Kendell Bane, NP  OXYGEN Inhale into the lungs. Inhale 4 liters into lungs continously    [provider]  silver sulfADIAZINE (SILVADENE) 1 % cream APPLY 1 APPLICATION TOPICALLY DAILY 02/15/19   Ronnell Freshwater, NP  simvastatin (ZOCOR) 80 MG tablet Take 80 mg by mouth daily.    [provider]  sulfamethoxazole-trimethoprim (BACTRIM) 400-80 MG tablet Take 1 tablet by mouth 2 (two) times daily. 07/20/19   Kendell Bane, NP  traMADol (ULTRAM) 50 MG tablet TAKE 1 TABLET BY MOUTH EVERY 12 HOURS ASNEEDED FOR MODERATE PAIN 08/24/19   Kendell Bane, NP  VICTOZA 18 MG/3ML SOPN INJECT 1.8 MG EVERY MORNING 05/27/19   Kendell Bane, NP    Physical Exam: Vitals:   03/29/20 1110 03/29/20 1111 03/29/20 1115 03/29/20 1130  BP:  (!) 154/119 (!) 154/119 (!) 163/69  Pulse: 88 89 89 87  Resp: '19  16 19  ' Temp:      TempSrc:      SpO2: 100% 100% 100% 98%  Weight:      Height:         Vitals:   03/29/20 1110  03/29/20 1111 03/29/20 1115 03/29/20 1130  BP:  (!) 154/119 (!) 154/119 (!) 163/69  Pulse: 88 89 89 87  Resp: '19  16 19  ' Temp:      TempSrc:      SpO2: 100% 100% 100% 98%  Weight:      Height:        Constitutional: NAD, alert and oriented x 2.  Person and time.  Morbidly obese Eyes: PERRL, lids and conjunctivae normal ENMT: Mucous membranes are moist.  Neck: normal, supple, no masses, no thyromegaly Respiratory: clear to auscultation bilaterally, no wheezing, no crackles. Normal respiratory effort. No accessory muscle use.  Cardiovascular: Regular rate and rhythm, no murmurs / rubs / gallops. No extremity edema. 2+ pedal pulses. No carotid bruits.  Abdomen: no tenderness, no masses palpated. No hepatosplenomegaly. Bowel sounds positive.  Central adiposity Musculoskeletal: no clubbing / cyanosis.  Chronic left leg swelling with redness Skin: no rashes, lesions, ulcers.  Neurologic: No gross focal neurologic deficit.  Generalized weakness Psychiatric: Normal mood and affect.   Labs on Admission: I have personally reviewed following labs and imaging studies  CBC: Recent Labs  Lab 03/28/20 1019 03/29/20 0636  WBC 5.0 7.4  NEUTROABS  --  6.0  HGB 8.2* 9.2*  HCT 26.6* 29.5*  MCV 91.4 90.2  PLT 134* 191*   Basic Metabolic Panel: Recent Labs  Lab 03/28/20 1019 03/29/20 0636  NA 141 142  K 3.8 4.2  CL 105 104  CO2 25 26  GLUCOSE 62* 126*  BUN 29* 28*  CREATININE 1.62* 1.65*  CALCIUM 7.8* 8.1*   GFR: Estimated Creatinine Clearance: 58.3 mL/min (A) (by C-G formula based on SCr of 1.65 mg/dL (H)). Liver Function Tests: No results for input(s): AST, ALT, ALKPHOS, BILITOT, PROT, ALBUMIN in the last 168 hours. No results for input(s): LIPASE, AMYLASE in the last 168 hours. No results for input(s): AMMONIA in the last 168 hours. Coagulation Profile: No results for input(s): INR, PROTIME in the last 168 hours. Cardiac Enzymes: No results for input(s): CKTOTAL, CKMB,  CKMBINDEX, TROPONINI in the last 168 hours. BNP (last 3 results) No results for input(s): PROBNP in  the last 8760 hours. HbA1C: No results for input(s): HGBA1C in the last 72 hours. CBG: No results for input(s): GLUCAP in the last 168 hours. Lipid Profile: No results for input(s): CHOL, HDL, LDLCALC, TRIG, CHOLHDL, LDLDIRECT in the last 72 hours. Thyroid Function Tests: Recent Labs    03/29/20 0636  TSH 8.112*   Anemia Panel: No results for input(s): VITAMINB12, FOLATE, FERRITIN, TIBC, IRON, RETICCTPCT in the last 72 hours. Urine analysis:    Component Value Date/Time   COLORURINE YELLOW (A) 03/29/2020 0948   APPEARANCEUR CLEAR (A) 03/29/2020 0948   LABSPEC 1.010 03/29/2020 0948   PHURINE 5.0 03/29/2020 0948   GLUCOSEU NEGATIVE 03/29/2020 0948   HGBUR SMALL (A) 03/29/2020 0948   BILIRUBINUR NEGATIVE 03/29/2020 0948   KETONESUR NEGATIVE 03/29/2020 0948   PROTEINUR >=300 (A) 03/29/2020 0948   NITRITE NEGATIVE 03/29/2020 0948   LEUKOCYTESUR NEGATIVE 03/29/2020 0948    Radiological Exams on Admission: CT Head Wo Contrast  Result Date: 03/29/2020 CLINICAL DATA:  Minor head trauma.  Fall. EXAM: CT HEAD WITHOUT CONTRAST TECHNIQUE: Contiguous axial images were obtained from the base of the skull through the vertex without intravenous contrast. COMPARISON:  02/26/2019 FINDINGS: Brain: No evidence of acute infarction, hemorrhage, hydrocephalus, extra-axial collection or mass lesion/mass effect. Moderate remote infarct in the superior left frontal lobe, non progressed. Vascular: No hyperdense vessel or unexpected calcification. Skull: Normal. Negative for fracture or focal lesion. Sinuses/Orbits: No acute finding. IMPRESSION: 1. No evidence of intracranial injury. 2. Remote left frontal infarct. Electronically Signed   By: Monte Fantasia M.D.   On: 03/29/2020 07:46   MR BRAIN WO CONTRAST  Result Date: 03/29/2020 CLINICAL DATA:  Delirium EXAM: MRI HEAD WITHOUT CONTRAST TECHNIQUE:  Multiplanar, multiecho pulse sequences of the brain and surrounding structures were obtained without intravenous contrast. COMPARISON:  Head CT March 29, 2020 FINDINGS: Brain: No acute infarction, hemorrhage, hydrocephalus, extra-axial collection or mass lesion. Area of encephalomalacia and gliosis is seen in paramedian left frontal and parietal regions, suggestive of prior watershed infarcts. Scattered foci of T2 hyperintensity are seen within the white matter of the cerebral hemispheres and within the pons, nonspecific, most likely related to mild chronic microangiopathic changes. Vascular: Abnormal flow void in the bilateral carotid canals may represent slow flow versus occlusion. Correlation MRA or CTA recommended. Skull and upper cervical spine: Normal marrow signal. Sinuses/Orbits: Negative. Other: Mild bilateral mastoid effusion. IMPRESSION: 1. No acute intracranial abnormality. 2. Abnormal flow void in the bilateral carotid canals may represent slow flow versus occlusion. Correlation MRA or CTA recommended. 3. Encephalomalacia and gliosis in the paramedian left frontal and parietal regions, suggestive of prior watershed infarcts. 4. Mild chronic microangiopathic changes. Electronically Signed   By: Pedro Earls M.D.   On: 03/29/2020 11:08   DG Chest Portable 1 View  Result Date: 03/29/2020 CLINICAL DATA:  Recurrent falls. EXAM: PORTABLE CHEST 1 VIEW COMPARISON:  07/03/2019.  11/28/2014. FINDINGS: Cardiomegaly with mild pulmonary venous congestion. Low lung volumes with bibasilar atelectasis/scarring again noted. Pleural effusion pneumothorax no acute bony abnormality identified. IMPRESSION: 1. Cardiomegaly with mild pulmonary venous congestion. 2. Low lung volumes with bibasilar atelectasis/scarring again noted. Electronically Signed   By: Marcello Moores  Register   On: 03/29/2020 07:24   DG Knee Complete 4 Views Left  Result Date: 03/29/2020 CLINICAL DATA:  Recurrent falls.  Left knee pain.  EXAM: LEFT KNEE - COMPLETE 4+ VIEW COMPARISON:  Left tib fib 12/29/2015. FINDINGS: Postsurgical changes noted the distal femur. Severe degenerative changes about the  left knee joint again noted. Stable lateral subluxation of the tibia, most likely degenerative. No evidence of fracture or dislocation. Coarse dystrophic calcifications noted about the lower extremity. Peripheral vascular calcification noted. IMPRESSION: 1. No acute bony abnormality. 2. Postsurgical changes noted about the femur. Severe degenerative changes again noted about the left knee. Lateral subluxation of the tibia, stable from prior exam and most likely degenerative. 3. Peripheral vascular disease. Electronically Signed   By: Marcello Moores  Register   On: 03/29/2020 07:27    EKG: Independently reviewed.  Sinus rhythm with low voltage QRS  Assessment/Plan Principal Problem:   Acute metabolic encephalopathy Active Problems:   COPD (chronic obstructive pulmonary disease) ([facility])   HTN (hypertension)   Primary hypothyroidism   Type 2 diabetes mellitus with stage 3 chronic kidney disease ([facility])   Acute lower UTI     Acute metabolic encephalopathy Most likely secondary to urinary tract infection Patient has visual hallucinations and is only oriented to person and time not to place which is not her baseline Expect improvement in patient's mental status following resolution of acute infection   Urinary tract infection Patient has pyuria Urine culture yields > 100,000 colonies of E. Coli We will place patient on Rocephin 1 g IV daily to complete a 3-day course of therapy    Diabetes with complications of stage III chronic kidney disease Place patient on consistent carbohydrate diet We will place patient on sliding scale coverage Accu-Cheks before meals and at bedtime     COPD with chronic respiratory failure Not acutely exacerbated Patient is on 4 L of oxygen continuous Continue as needed bronchodilator therapy as well as  inhaled steroids    Coronary artery disease Continue metoprolol, nitrates, statins, aspirin and Plavix   Morbid obesity (BMI 61) Complicates overall prognosis and care   Frequent falls Unclear etiology We will hold Elavil due to concerns of hypotension Place patient on fall precautions Request PT evaluation  DVT prophylaxis: Lovenox Code Status: Full code Family Communication: Greater than 50% of time was spent discussing patient's condition and plan of care at the bedside.  All questions and concerns have been addressed.  She verbalizes understanding and agrees to the plan. Disposition Plan: Back to previous home environment Consults called: Physical therapy    Jessikah Dicker MD [facility]     03/29/2020, 1:04 PM

## 2020-03-29 NOTE — ED Notes (Signed)
Patient provided with TV remote, diet soda and warm blanket. Continues to eat diet tray as well. purewick in place, remains on cardiac monitor.

## 2020-03-30 ENCOUNTER — Encounter: Payer: Self-pay | Admitting: Internal Medicine

## 2020-03-30 DIAGNOSIS — N390 Urinary tract infection, site not specified: Secondary | ICD-10-CM | POA: Diagnosis not present

## 2020-03-30 DIAGNOSIS — M25562 Pain in left knee: Secondary | ICD-10-CM | POA: Diagnosis not present

## 2020-03-30 DIAGNOSIS — G9341 Metabolic encephalopathy: Secondary | ICD-10-CM | POA: Diagnosis not present

## 2020-03-30 LAB — GLUCOSE, CAPILLARY
Glucose-Capillary: 210 mg/dL — ABNORMAL HIGH (ref 70–99)
Glucose-Capillary: 210 mg/dL — ABNORMAL HIGH (ref 70–99)
Glucose-Capillary: 202 mg/dL — ABNORMAL HIGH (ref 70–99)
Glucose-Capillary: 144 mg/dL — ABNORMAL HIGH (ref 70–99)

## 2020-03-30 LAB — BASIC METABOLIC PANEL
Anion gap: 9 (ref 5–15)
BUN: 29 mg/dL — ABNORMAL HIGH (ref 8–23)
CO2: 27 mmol/L (ref 22–32)
Calcium: 7.7 mg/dL — ABNORMAL LOW (ref 8.9–10.3)
Chloride: 102 mmol/L (ref 98–111)
Creatinine, Ser: 1.78 mg/dL — ABNORMAL HIGH (ref 0.44–1.00)
GFR, Estimated: 31 mL/min — ABNORMAL LOW (ref >60)
Glucose, Bld: 251 mg/dL — ABNORMAL HIGH (ref 70–99)
Potassium: 4.2 mmol/L (ref 3.5–5.1)
Sodium: 138 mmol/L (ref 135–145)

## 2020-03-30 LAB — CBC
HCT: 26.5 % — ABNORMAL LOW (ref 36.0–46.0)
Hemoglobin: 8.3 g/dL — ABNORMAL LOW (ref 12.0–15.0)
MCH: 28.3 pg (ref 26.0–34.0)
MCHC: 31.3 g/dL (ref 30.0–36.0)
MCV: 90.4 fL (ref 80.0–100.0)
Platelets: 135 10*3/uL — ABNORMAL LOW (ref 150–400)
RBC: 2.93 MIL/uL — ABNORMAL LOW (ref 3.87–5.11)
RDW: 16.9 % — ABNORMAL HIGH (ref 11.5–15.5)
WBC: 6 10*3/uL (ref 4.0–10.5)
nRBC: 0 % (ref 0.0–0.2)

## 2020-03-30 LAB — MRSA PCR SCREENING: MRSA by PCR: NEGATIVE

## 2020-03-30 LAB — AMMONIA: Ammonia: 16 umol/L (ref 9–35)

## 2020-03-30 LAB — SARS CORONAVIRUS 2 (TAT 6-24 HRS): SARS Coronavirus 2: NEGATIVE

## 2020-03-30 MED ORDER — NYSTATIN 100000 UNIT/GM EX POWD
Freq: Two times a day (BID) | CUTANEOUS | Status: DC
  Filled 2020-03-30 (×3): qty 15

## 2020-03-30 MED ORDER — ISOSORBIDE MONONITRATE ER 30 MG PO TB24
15.0000 mg | ORAL_TABLET | Freq: Every day | ORAL | Status: DC
  Administered 2020-03-31 – 2020-04-13 (×14): 15 mg via ORAL
  Filled 2020-03-30 (×14): qty 1

## 2020-03-30 MED ORDER — HALOPERIDOL LACTATE 5 MG/ML IJ SOLN
2.0000 mg | Freq: Once | INTRAMUSCULAR | Status: AC
  Administered 2020-03-30: 2 mg via INTRAVENOUS
  Filled 2020-03-30: qty 1

## 2020-03-30 MED ORDER — INSULIN GLARGINE 100 UNIT/ML ~~LOC~~ SOLN
30.0000 [IU] | Freq: Every day | SUBCUTANEOUS | Status: DC
  Administered 2020-03-30: 30 [IU] via SUBCUTANEOUS
  Filled 2020-03-30 (×2): qty 0.3

## 2020-03-30 NOTE — Plan of Care (Signed)
Pt is alert and oriented to person and place only. V/S checked and stable. On chronic oxygen at 4lpm/[place] with oxygen saturation at high 90s%. Complains of pain on both legs when being repositioned. Has excoriation on perineal area and abdominal folds. Nystatin powder applied as ordered.

## 2020-03-30 NOTE — Progress Notes (Signed)
Pt yelling out and crying, pt states, "she's scared" pt is seeing mice and roaches everywhere. Pt also talking to someone and fighting with the person she's seeing. Pt is very anxious and restless. Provider informed and ordered Haldol. Stayed with the pt and reassured. Haldol given as ordered.

## 2020-03-30 NOTE — Progress Notes (Signed)
PROGRESS NOTE    ALBINA GOSNEY  ONG:[id number] DOB: August 19, 1955 DOA: 03/29/2020 PCP: Lyndon Code, MD  Brief Narrative: This is a chronically ill morbidly obese female BMI of 60, type 2 diabetes mellitus, COPD/chronic respiratory failure on 4 L home O2, chronic venous insufficiency, hypothyroidism, chronic diastolic CHF, chronic osteoarthritis, depression was brought to the ER after he had another fall, she resides at a local assisted living facility and usually ambulates only short distances with an assist device, otherwise uses a wheelchair has been seen in the ER multiple times for recurrent falls, while in the emergency room she was noted to have visual hallucinations.  Work-up in the emergency room was relatively unrevealing except for abnormal urinalysis, ABG ruled out hypercarbia, no leukocytosis, CT head without acute findings, chest x-ray noted mild pulmonary venous congestion and basilar atelectasis she also had an MRI of her left knee which showed severe degenerative changes.  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  Assessment & Plan:   Visual hallucinations Encephalopathy -Continues to have some visual hallucinations this morning but otherwise she is awake alert oriented to self, place and partly to time -Etiology is unclear, urinalysis not very impressive however recently grew pansensitive E. coli from urine culture last week -Agree with empiric course of ceftriaxone day 2 today -History not suggestive of any medication withdrawal, also check ammonia level -TSH is high but free T4 is normal, will need thyroid function test repeated in 4 to 6 weeks -PT/OT  Possible UTI -See above, continue IV ceftriaxone day 2  Morbid obesity -BMI 60.2 -Most significant comorbidity, contributing to ongoing functional decline  Frequent falls -Suspect this is secondary to morbid obesity, overall gait disorder and chronic  osteoarthritis especially in her left knee -PT OT eval -She may need SNF  COPD Chronic respiratory failure on 4 L home O2 -Stable, no wheezing continue nebs, ICS/LABA  History of CAD -Stable, continue aspirin, Plavix, metoprolol and nitrates -Decrease nitrate dose to avoid orthostatic hypotension  CKD 3a -Baseline creatinine around 1.6, now 1.7 -Stable, monitor  Type 2 diabetes mellitus -On 50 units of Lantus and NovoLog sliding scale at baseline -Restart Lantus at a lower dose  DVT prophylaxis: Lovenox Code Status: Full code Family Communication: No family at bedside Disposition Plan:  Status is: Inpatient  Remains inpatient appropriate because:Inpatient level of care appropriate due to severity of illness   Dispo: The patient is from: ALF              Anticipated d/c is to: SNF              Anticipated d/c date is: 1 day              Patient currently is not medically stable to d/c.   Difficult to place patient No  Consultants:      Procedures:   No outven`t. Antimicrobials:    Subjective: -Reported seeing some babies by her window -Denies any other complaints, tells me that she falls every time she tries to get up and walk, has been in the ED numerous times for same  Objective: Vitals:   03/29/20 2226 03/30/20 0028 03/30/20 0321 03/30/20 0828  BP: (!) 115/99 133/64 122/70 133/75  Pulse: 81 72 75 76  Resp: 20 20 20 20   Temp:  98.4 F (36.9 C) 98.4 F (36.9 C) 98.2 F (36.8 C)  TempSrc:  Oral Oral Oral  SpO2: 98% 98% 98% 100%  Weight:  (!) 174.4 kg    Height:  5' 7.01" (1.702 m)      Intake/Output Summary (Last 24 hours) at 03/30/2020 1120 Last data filed at 03/30/2020 0941 Gross per 24 hour  Intake 420 ml  Output 2700 ml  Net -2280 ml   Filed Weights   03/29/20 0626 03/30/20 0028  Weight: (!) 179.2 kg (!) 174.4 kg    Examination:  General exam: Morbidly obese female laying in bed, awake alert oriented to self and place, partly to time, no  distress CVS: S1-S2, distant heart sounds Lungs: Distant lung sounds, otherwise clear, no wheezing heard Abdomen: Obese, soft, large pannus, nontender, bowel sounds present Extremities: No edema, multiple scabs in both lower legs and feet Neuro: Moves all extremities, no localizing signs  Psychiatry: Mood & affect appropriate.     Data Reviewed:   CBC: Recent Labs  Lab 03/28/20 1019 03/29/20 0636 03/30/20 0520  WBC 5.0 7.4 6.0  NEUTROABS  --  6.0  --   HGB 8.2* 9.2* 8.3*  HCT 26.6* 29.5* 26.5*  MCV 91.4 90.2 90.4  PLT 134* 139* 135*   Basic Metabolic Panel: Recent Labs  Lab 03/28/20 1019 03/29/20 0636 03/30/20 0520  NA 141 142 138  K 3.8 4.2 4.2  CL 105 104 102  CO2 25 26 27   GLUCOSE 62* 126* 251*  BUN 29* 28* 29*  CREATININE 1.62* 1.65* 1.78*  CALCIUM 7.8* 8.1* 7.7*   GFR: Estimated Creatinine Clearance: 53.1 mL/min (A) (by C-G formula based on SCr of 1.78 mg/dL (H)). Liver Function Tests: No results for input(s): AST, ALT, ALKPHOS, BILITOT, PROT, ALBUMIN in the last 168 hours. No results for input(s): LIPASE, AMYLASE in the last 168 hours. No results for input(s): AMMONIA in the last 168 hours. Coagulation Profile: No results for input(s): INR, PROTIME in the last 168 hours. Cardiac Enzymes: No results for input(s): CKTOTAL, CKMB, CKMBINDEX, TROPONINI in the last 168 hours. BNP (last 3 results) No results for input(s): PROBNP in the last 8760 hours. HbA1C: Recent Labs    03/29/20 1305  HGBA1C 7.6*   CBG: Recent Labs  Lab 03/29/20 1644 03/29/20 2128 03/30/20 0755  GLUCAP 147* 234* 210*   Lipid Profile: No results for input(s): CHOL, HDL, LDLCALC, TRIG, CHOLHDL, LDLDIRECT in the last 72 hours. Thyroid Function Tests: Recent Labs    03/29/20 0636 03/29/20 1436  TSH 8.112*  --   FREET4  --  1.11   Anemia Panel: No results for input(s): VITAMINB12, FOLATE, FERRITIN, TIBC, IRON, RETICCTPCT in the last 72 hours. Urine analysis:    Component  Value Date/Time   COLORURINE YELLOW (A) 03/29/2020 0948   APPEARANCEUR CLEAR (A) 03/29/2020 0948   LABSPEC 1.010 03/29/2020 0948   PHURINE 5.0 03/29/2020 0948   GLUCOSEU NEGATIVE 03/29/2020 0948   HGBUR SMALL (A) 03/29/2020 0948   BILIRUBINUR NEGATIVE 03/29/2020 0948   KETONESUR NEGATIVE 03/29/2020 0948   PROTEINUR >=300 (A) 03/29/2020 0948   NITRITE NEGATIVE 03/29/2020 0948   LEUKOCYTESUR NEGATIVE 03/29/2020 0948   Sepsis Labs: @LABRCNTIP (procalcitonin:4,lacticidven:4)  ) Recent Results (from the past 240 hour(s))  Urine culture     Status: Abnormal   Collection Time: 03/21/20  7:06 PM   Specimen: Urine, Random  Result Value Ref Range Status   Specimen Description   Final    URINE, RANDOM Performed at [facility], [address], [place], [address] [place]    Special Requests   Final    NONE Performed at [facility], [address] [place], [place] [address]    Culture >=100,000 COLONIES/mL ESCHERICHIA COLI (A)  Final   Report Status 03/24/2020 FINAL  Final   Organism ID, Bacteria ESCHERICHIA COLI (A)  Final      Susceptibility   Escherichia coli - MIC*    AMPICILLIN >=32 RESISTANT Resistant     CEFAZOLIN <=4 SENSITIVE Sensitive     CEFEPIME <=0.12 SENSITIVE Sensitive     CEFTRIAXONE <=0.25 SENSITIVE Sensitive     CIPROFLOXACIN <=0.25 SENSITIVE Sensitive     GENTAMICIN <=1 SENSITIVE Sensitive     IMIPENEM <=0.25 SENSITIVE Sensitive     NITROFURANTOIN <=16 SENSITIVE Sensitive     TRIMETH/SULFA <=20 SENSITIVE Sensitive     AMPICILLIN/SULBACTAM 4 SENSITIVE Sensitive     PIP/TAZO <=4 SENSITIVE Sensitive     * >=100,000 COLONIES/mL ESCHERICHIA COLI  SARS CORONAVIRUS 2 (TAT 6-24 HRS) Nasopharyngeal Nasopharyngeal Swab     Status: None   Collection Time: 03/29/20  3:35 PM   Specimen: Nasopharyngeal Swab  Result Value Ref Range Status   SARS Coronavirus 2 NEGATIVE NEGATIVE Final    Comment: (NOTE) SARS-CoV-2 target nucleic acids are NOT  DETECTED.  The SARS-CoV-2 RNA is generally detectable in upper and lower respiratory specimens during the acute phase of infection. Negative results do not preclude SARS-CoV-2 infection, do not rule out co-infections with other pathogens, and should not be used as the sole basis for treatment or other patient management decisions. Negative results must be combined with clinical observations, patient history, and epidemiological information. The expected result is Negative.  Fact Sheet for Patients: HairSlick.nohttps://www.fda.gov/media/138098/download  Fact Sheet for Healthcare Providers: quierodirigir.comhttps://www.fda.gov/media/138095/download  This test is not yet approved or cleared by the [place] FDA and  has been authorized for detection and/or diagnosis of SARS-CoV-2 by FDA under an Emergency Use Authorization (EUA). This EUA will remain  in effect (meaning this test can be used) for the duration of the COVID-19 declaration under Se ction 564(b)(1) of the Act, 21 U.S.C. section 360bbb-3(b)(1), unless the authorization is terminated or revoked sooner.  Performed at [facility], [address] [place], [place] [address]   MRSA PCR Screening     Status: None   Collection Time: 03/30/20  2:05 AM   Specimen: Nasal Mucosa; Nasopharyngeal  Result Value Ref Range Status   MRSA by PCR NEGATIVE NEGATIVE Final    Comment:        The GeneXpert MRSA Assay (FDA approved for NASAL specimens only), is one component of a comprehensive MRSA colonization surveillance program. It is not intended to diagnose MRSA infection nor to guide or monitor treatment for MRSA infections. Performed at [facility], [address] [place], [place] [address]          Radiology Studies: CT Angio Head W or Wo Contrast  Result Date: 03/29/2020 CLINICAL DATA:  Dizziness. EXAM: CT ANGIOGRAPHY HEAD AND NECK TECHNIQUE: Multidetector CT imaging of the head and neck was performed using the standard  protocol during bolus administration of intravenous contrast. Multiplanar CT image reconstructions and MIPs were obtained to evaluate the vascular anatomy. Carotid stenosis measurements (when applicable) are obtained utilizing NASCET criteria, using the distal internal carotid diameter as the denominator. CONTRAST:  60mL OMNIPAQUE IOHEXOL 350 MG/ML SOLN COMPARISON:  MRI of the brain March 29, 2020. FINDINGS: CTA NECK FINDINGS Aortic arch: Standard branching. Imaged portion shows no evidence of aneurysm or dissection. Minimal atherosclerotic changes. No significant stenosis of the major arch vessel origins. Right  carotid system: Atherosclerotic changes of the right carotid bifurcation with complete occlusion of the right ICA at the origin. Left carotid system: Atherosclerotic changes of the left carotid bifurcation with complete occlusion of the left ICA at the origin. Vertebral arteries: Mild atherosclerotic changes at the origin of the bilateral vertebral arteries without hemodynamically significant stenosis. The vertebral arteries are codominant and have normal course and caliber throughout the neck. Skeleton: No acute findings. Other neck: 14 mm hypodense right thyroid lobe nodule. Upper chest: Negative. Review of the MIP images confirms the above findings CTA HEAD FINDINGS Anterior circulation: Reconstitution of the flow to the right ICA at the communicating segment from a prominent right posterior communicating artery. The right ACA and MCA vascular trees are maintained. Reconstitution of flow to the left ICA with diminutive caliber at the communicating segment. Small left posterior communicating artery noted. The left MCA vascular tree is also of diminished caliber. The left A1/ACA segment has small caliber which may be related to hypoplasia versus decreased inflow. The left ACA vascular tree is maintained, likely related to flow via anterior communicating artery. Posterior circulation: Calcified plaques are  seen in the bilateral V4 segments without hemodynamically significant stenosis. The basilar artery and bilateral posterior cerebral arteries are maintained. Venous sinuses: As permitted by contrast timing, patent. Review of the MIP images confirms the above findings IMPRESSION: 1. 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 2. Reconstitution of flow to the right ICA at the communicating segment from a prominent right posterior communicating artery. 3. Reconstitution of flow to the left ICA, with diminutive caliber, at the communicating segment from a small left posterior communicating artery. 4. Small caliber left A1/ACA segment and left MCA vascular tree, which may be related to hypoplasia versus decreased inflow. Left ACA vascular tree is maintained, likely related to flow via anterior communicating artery. 5. 14 mm hypodense right thyroid lobe nodule. These results were called by telephone at the time of interpretation on 03/29/2020 at 2:04 pm to provider Shaune Pollack , who verbally acknowledged these results. Electronically Signed   By: Baldemar Lenis M.D.   On: 03/29/2020 14:04   CT Head Wo Contrast  Result Date: 03/29/2020 CLINICAL DATA:  Minor head trauma.  Fall. EXAM: CT HEAD WITHOUT CONTRAST TECHNIQUE: Contiguous axial images were obtained from the base of the skull through the vertex without intravenous contrast. COMPARISON:  02/26/2019 FINDINGS: Brain: No evidence of acute infarction, hemorrhage, hydrocephalus, extra-axial collection or mass lesion/mass effect. Moderate remote infarct in the superior left frontal lobe, non progressed. Vascular: No hyperdense vessel or unexpected calcification. Skull: Normal. Negative for fracture or focal lesion. Sinuses/Orbits: No acute finding. IMPRESSION: 1. No evidence of intracranial injury. 2. Remote left frontal infarct. Electronically Signed   By: Marnee Spring M.D.   On: 03/29/2020 07:46   CT Angio Neck W and/or Wo  Contrast  Result Date: 03/29/2020 CLINICAL DATA:  Dizziness. EXAM: CT ANGIOGRAPHY HEAD AND NECK TECHNIQUE: Multidetector CT imaging of the head and neck was performed using the standard protocol during bolus administration of intravenous contrast. Multiplanar CT image reconstructions and MIPs were obtained to evaluate the vascular anatomy. Carotid stenosis measurements (when applicable) are obtained utilizing NASCET criteria, using the distal internal carotid diameter as the denominator. CONTRAST:  64mL OMNIPAQUE IOHEXOL 350 MG/ML SOLN COMPARISON:  MRI of the brain March 29, 2020. FINDINGS: CTA NECK FINDINGS Aortic arch: Standard branching. Imaged portion shows no evidence of aneurysm or dissection. Minimal atherosclerotic changes. No significant  stenosis of the major arch vessel origins. Right carotid system: Atherosclerotic changes of the right carotid bifurcation with complete occlusion of the right ICA at the origin. Left carotid system: Atherosclerotic changes of the left carotid bifurcation with complete occlusion of the left ICA at the origin. Vertebral arteries: Mild atherosclerotic changes at the origin of the bilateral vertebral arteries without hemodynamically significant stenosis. The vertebral arteries are codominant and have normal course and caliber throughout the neck. Skeleton: No acute findings. Other neck: 14 mm hypodense right thyroid lobe nodule. Upper chest: Negative. Review of the MIP images confirms the above findings CTA HEAD FINDINGS Anterior circulation: Reconstitution of the flow to the right ICA at the communicating segment from a prominent right posterior communicating artery. The right ACA and MCA vascular trees are maintained. Reconstitution of flow to the left ICA with diminutive caliber at the communicating segment. Small left posterior communicating artery noted. The left MCA vascular tree is also of diminished caliber. The left A1/ACA segment has small caliber which may be  related to hypoplasia versus decreased inflow. The left ACA vascular tree is maintained, likely related to flow via anterior communicating artery. Posterior circulation: Calcified plaques are seen in the bilateral V4 segments without hemodynamically significant stenosis. The basilar artery and bilateral posterior cerebral arteries are maintained. Venous sinuses: As permitted by contrast timing, patent. Review of the MIP images confirms the above findings IMPRESSION: 1. 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 2. Reconstitution of flow to the right ICA at the communicating segment from a prominent right posterior communicating artery. 3. Reconstitution of flow to the left ICA, with diminutive caliber, at the communicating segment from a small left posterior communicating artery. 4. Small caliber left A1/ACA segment and left MCA vascular tree, which may be related to hypoplasia versus decreased inflow. Left ACA vascular tree is maintained, likely related to flow via anterior communicating artery. 5. 14 mm hypodense right thyroid lobe nodule. These results were called by telephone at the time of interpretation on 03/29/2020 at 2:04 pm to provider Shaune Pollack , who verbally acknowledged these results. Electronically Signed   By: Baldemar Lenis M.D.   On: 03/29/2020 14:04   MR BRAIN WO CONTRAST  Result Date: 03/29/2020 CLINICAL DATA:  Delirium EXAM: MRI HEAD WITHOUT CONTRAST TECHNIQUE: Multiplanar, multiecho pulse sequences of the brain and surrounding structures were obtained without intravenous contrast. COMPARISON:  Head CT March 29, 2020 FINDINGS: Brain: No acute infarction, hemorrhage, hydrocephalus, extra-axial collection or mass lesion. Area of encephalomalacia and gliosis is seen in paramedian left frontal and parietal regions, suggestive of prior watershed infarcts. Scattered foci of T2 hyperintensity are seen within the white matter of the cerebral hemispheres  and within the pons, nonspecific, most likely related to mild chronic microangiopathic changes. Vascular: Abnormal flow void in the bilateral carotid canals may represent slow flow versus occlusion. Correlation MRA or CTA recommended. Skull and upper cervical spine: Normal marrow signal. Sinuses/Orbits: Negative. Other: Mild bilateral mastoid effusion. IMPRESSION: 1. No acute intracranial abnormality. 2. Abnormal flow void in the bilateral carotid canals may represent slow flow versus occlusion. Correlation MRA or CTA recommended. 3. Encephalomalacia and gliosis in the paramedian left frontal and parietal regions, suggestive of prior watershed infarcts. 4. Mild chronic microangiopathic changes. Electronically Signed   By: Baldemar Lenis M.D.   On: 03/29/2020 11:08   DG Chest Portable 1 View  Result Date: 03/29/2020 CLINICAL DATA:  Recurrent falls. EXAM: PORTABLE CHEST 1 VIEW COMPARISON:  07/03/2019.  11/28/2014. FINDINGS: Cardiomegaly with mild pulmonary venous congestion. Low lung volumes with bibasilar atelectasis/scarring again noted. Pleural effusion pneumothorax no acute bony abnormality identified. IMPRESSION: 1. Cardiomegaly with mild pulmonary venous congestion. 2. Low lung volumes with bibasilar atelectasis/scarring again noted. Electronically Signed   By: Maisie Fus  Register   On: 03/29/2020 07:24   DG Knee Complete 4 Views Left  Result Date: 03/29/2020 CLINICAL DATA:  Recurrent falls.  Left knee pain. EXAM: LEFT KNEE - COMPLETE 4+ VIEW COMPARISON:  Left tib fib 12/29/2015. FINDINGS: Postsurgical changes noted the distal femur. Severe degenerative changes about the left knee joint again noted. Stable lateral subluxation of the tibia, most likely degenerative. No evidence of fracture or dislocation. Coarse dystrophic calcifications noted about the lower extremity. Peripheral vascular calcification noted. IMPRESSION: 1. No acute bony abnormality. 2. Postsurgical changes noted about the  femur. Severe degenerative changes again noted about the left knee. Lateral subluxation of the tibia, stable from prior exam and most likely degenerative. 3. Peripheral vascular disease. Electronically Signed   By: Maisie Fus  Register   On: 03/29/2020 07:27        Scheduled Meds: . allopurinol  100 mg Oral Daily  . aspirin EC  81 mg Oral Daily  . atorvastatin  40 mg Oral Daily  . diclofenac sodium  2 g Topical TID  . DULoxetine  60 mg Oral BID  . enoxaparin (LOVENOX) injection  0.5 mg/kg Subcutaneous Q24H  . fluticasone  2 spray Each Nare Daily  . insulin aspart  0-20 Units Subcutaneous TID WC  . isosorbide mononitrate  30 mg Oral Daily  . loratadine  10 mg Oral Daily  . melatonin  10 mg Oral QHS  . metoprolol succinate  25 mg Oral Daily  . mometasone-formoterol  2 puff Inhalation BID  . multivitamin with minerals  1 tablet Oral Daily  . nystatin   Topical BID  . sodium chloride flush  3 mL Intravenous Q12H   Continuous Infusions: . sodium chloride    . cefTRIAXone (ROCEPHIN)  IV Stopped (03/29/20 1428)     LOS: 1 day    Time spent:  Zannie Cove, MD [facility] 03/30/2020, 11:20 AM

## 2020-03-30 NOTE — Evaluation (Signed)
Physical Therapy Evaluation Patient Details Name: Deanna Rice MRN: [id number] DOB: 09-30-1955 Today's Date: 03/30/2020   History of Present Illness  Patient is a 65 y.o. female with medical history significant for morbid obesity, diabetes mellitus, COPD with chronic respiratory failure, venous insufficiency, hypothyroidism, depression and CHF who was brought into the ER for evaluation following a fall with history of multiple falls. Noted to have visual hallucinations while in the ED. Acute metabolic encephalopathy and possible UTI    Clinical Impression  PT evaluation completed. Patient oriented x 4 but does have visual hallucinations of two children in the room. Patient able to follow all single step commands without difficulty. Patient reports multiple recent falls at ALF due to BLE giving out. Patient reports she ambulates with crutches, limited distance at baseline. Patient required minimal assistance for occasional trunk support to sit upright. Patient declined standing this date due to fear of the bed moving and falling. Patient needs minimal assistance for scooting along edge of bed. Patient fatigued with activity with Sp02 96% on 4 L02. As patient lives in ALF at baseline and currently requires assistance with mobilizing, consider SNF placement at discharge for ongoing PT efforts before return to ALF. Recommend to continue PT to maximize independence and address functional limitations.    Follow Up Recommendations SNF    Equipment Recommendations   (to be determined at next level of care)    Recommendations for Other Services       Precautions / Restrictions Precautions Precautions: Fall Restrictions Weight Bearing Restrictions: No      Mobility  Bed Mobility Overal bed mobility: Needs Assistance Bed Mobility: Supine to Sit;Sit to Supine     Supine to sit: Min assist Sit to supine: Min guard   General bed mobility comments: Min A for occasional trunk support. verbal cues  for technique    Transfers Overall transfer level: Needs assistance   Transfers: Lateral/Scoot Transfers          Lateral/Scoot Transfers: Min assist General transfer comment: Min A provided for incremental scooting to right in sitting position. cues for technique. patient declined attempting to stand this date  Ambulation/Gait                Stairs            Wheelchair Mobility    Modified Rankin (Stroke Patients Only)       Balance Overall balance assessment: Needs assistance Sitting-balance support: Feet supported Sitting balance-Leahy Scale: Good Sitting balance - Comments: no loss of balance in sitting position                                     Pertinent Vitals/Pain Pain Assessment: No/denies pain    Home Living Family/patient expects to be discharged to:: Assisted living               Home Equipment: Walker - 4 wheels;Crutches;Wheelchair - manual;Hospital bed Additional Comments: Patient reports she has medication assistance and meals    Prior Function Level of Independence: Independent with assistive device(s)         Comments: patient reports she ambulates short distance with crutches and uses wheelchair for longer distances. patient reports she is independent with ADLs at baseline. 11 falls reported recently     Hand Dominance   Dominant Hand: Right    Extremity/Trunk Assessment   Upper Extremity Assessment Upper Extremity Assessment: Generalized weakness  Lower Extremity Assessment Lower Extremity Assessment: Generalized weakness (decreased light touch sensation distally BLE (chronic))       Communication   Communication: No difficulties  Cognition Arousal/Alertness: Awake/alert Behavior During Therapy: WFL for tasks assessed/performed                                   General Comments: patient is alert and oreinted x 4 and following all single step commands without difficulty.  patient does have visual hallucinations of 2 children playing in the room      General Comments      Exercises     Assessment/Plan    PT Assessment Patient needs continued PT services  PT Problem List Decreased strength;Decreased activity tolerance;Decreased balance;Decreased mobility;Decreased range of motion;Decreased safety awareness;Obesity;Impaired sensation       PT Treatment Interventions Gait training;DME instruction;Stair training;Functional mobility training;Therapeutic activities;Therapeutic exercise;Balance training;Patient/family education    PT Goals (Current goals can be found in the Care Plan section)  Acute Rehab PT Goals Patient Stated Goal: to go home PT Goal Formulation: With patient Time For Goal Achievement: 04/13/20 Potential to Achieve Goals: Fair    Frequency Min 2X/week   Barriers to discharge        Co-evaluation               AM-PAC PT "6 Clicks" Mobility  Outcome Measure Help needed turning from your back to your side while in a flat bed without using bedrails?: A Little Help needed moving from lying on your back to sitting on the side of a flat bed without using bedrails?: A Little Help needed moving to and from a bed to a chair (including a wheelchair)?: A Little Help needed standing up from a chair using your arms (e.g., wheelchair or bedside chair)?: A Little Help needed to walk in hospital room?: A Lot Help needed climbing 3-5 steps with a railing? : Total 6 Click Score: 15    End of Session   Activity Tolerance: Patient limited by fatigue Patient left: in bed;with call bell/phone within reach;with bed alarm set Nurse Communication: Mobility status PT Visit Diagnosis: Repeated falls (R29.6);Muscle weakness (generalized) (M62.81);Difficulty in walking, not elsewhere classified (R26.2)    Time: 1020-1041 PT Time Calculation (min) (ACUTE ONLY): 21 min   Charges:   PT Evaluation $PT Eval Low Complexity: 1 Low PT  Treatments $Therapeutic Activity: 8-22 mins       Donna Bernard, PT, MPT   Ina Homes 03/30/2020, 12:36 PM

## 2020-03-31 DIAGNOSIS — G9341 Metabolic encephalopathy: Secondary | ICD-10-CM | POA: Diagnosis not present

## 2020-03-31 DIAGNOSIS — N390 Urinary tract infection, site not specified: Secondary | ICD-10-CM | POA: Diagnosis not present

## 2020-03-31 DIAGNOSIS — M25562 Pain in left knee: Secondary | ICD-10-CM | POA: Diagnosis not present

## 2020-03-31 LAB — GLUCOSE, CAPILLARY
Glucose-Capillary: 114 mg/dL — ABNORMAL HIGH (ref 70–99)
Glucose-Capillary: 137 mg/dL — ABNORMAL HIGH (ref 70–99)
Glucose-Capillary: 145 mg/dL — ABNORMAL HIGH (ref 70–99)
Glucose-Capillary: 176 mg/dL — ABNORMAL HIGH (ref 70–99)

## 2020-03-31 LAB — BASIC METABOLIC PANEL
Anion gap: 9 (ref 5–15)
BUN: 33 mg/dL — ABNORMAL HIGH (ref 8–23)
CO2: 25 mmol/L (ref 22–32)
Calcium: 8 mg/dL — ABNORMAL LOW (ref 8.9–10.3)
Chloride: 104 mmol/L (ref 98–111)
Creatinine, Ser: 1.64 mg/dL — ABNORMAL HIGH (ref 0.44–1.00)
GFR, Estimated: 35 mL/min — ABNORMAL LOW (ref >60)
Glucose, Bld: 109 mg/dL — ABNORMAL HIGH (ref 70–99)
Potassium: 4.1 mmol/L (ref 3.5–5.1)
Sodium: 138 mmol/L (ref 135–145)

## 2020-03-31 MED ORDER — HALOPERIDOL LACTATE 5 MG/ML IJ SOLN
2.0000 mg | Freq: Four times a day (QID) | INTRAMUSCULAR | Status: DC | PRN
  Administered 2020-03-31 (×2): 2 mg via INTRAVENOUS
  Filled 2020-03-31 (×2): qty 1

## 2020-03-31 MED ORDER — DIAZEPAM 1 MG/ML PO SOLN
5.0000 mg | Freq: Once | ORAL | Status: DC
  Filled 2020-03-31 (×2): qty 5

## 2020-03-31 MED ORDER — LORAZEPAM 2 MG/ML IJ SOLN
0.5000 mg | Freq: Two times a day (BID) | INTRAMUSCULAR | Status: DC | PRN
  Administered 2020-03-31: 0.5 mg via INTRAVENOUS
  Filled 2020-03-31: qty 1

## 2020-03-31 MED ORDER — LORAZEPAM 2 MG/ML IJ SOLN: 1.0000 mg | INTRAMUSCULAR | Status: DC | PRN

## 2020-03-31 MED ORDER — INSULIN GLARGINE 100 UNIT/ML ~~LOC~~ SOLN
20.0000 [IU] | Freq: Every day | SUBCUTANEOUS | Status: DC
  Administered 2020-04-01 – 2020-04-02 (×2): 20 [IU] via SUBCUTANEOUS
  Filled 2020-03-31 (×3): qty 0.2

## 2020-03-31 MED ORDER — HALOPERIDOL LACTATE 5 MG/ML IJ SOLN
1.0000 mg | Freq: Four times a day (QID) | INTRAMUSCULAR | Status: DC | PRN
  Administered 2020-03-31: 1 mg via INTRAVENOUS

## 2020-03-31 NOTE — Progress Notes (Addendum)
PROGRESS NOTE    Deanna Rice  TDV:[id number] DOB: [birth year] DOA: 03/29/2020 PCP: Lyndon Code, MD  Brief Narrative: This is a chronically ill morbidly obese female BMI of 60, type 2 diabetes mellitus, COPD/chronic respiratory failure on 4 L home O2, chronic venous insufficiency, hypothyroidism, chronic diastolic CHF, chronic osteoarthritis, depression was brought to the ER after he had another fall, she resides at a local assisted living facility and usually ambulates only short distances with an assist device, otherwise uses a wheelchair has been seen in the ER multiple times for recurrent falls, while in the emergency room she was noted to have visual hallucinations.  Work-up in the emergency room was relatively unrevealing except for abnormal urinalysis, ABG ruled out hypercarbia, no leukocytosis, CT head without acute findings, chest x-ray noted mild pulmonary venous congestion and basilar atelectasis she also had an MRI of her left knee which showed severe degenerative changes.  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  Assessment & Plan:   Visual hallucinations Encephalopathy -Continues to have some visual hallucinations this morning but otherwise she is awake alert oriented to self, place and partly to time -Etiology is unclear, urinalysis not very impressive however recently grew pansensitive E. coli from urine culture last week -Agree with empiric course of ceftriaxone day 3 today -History not suggestive of any medication withdrawal, also check ammonia level -TSH is high but free T4 is normal, will need thyroid function test repeated in 4 to 6 weeks -PT/OT ADDENDUM: significantly worsened agitation this afternoon, labs and workup overall unremarkable, no urinary retention, attempt haldol   Possible UTI -See above, on day 3 of Ceftriaxone -Unfortunately urine culture did not get collected on admission  Morbid  obesity -BMI 60.2 -Most significant comorbidity, contributing to ongoing functional decline  Frequent falls -Suspect this is secondary to morbid obesity, overall gait disorder and chronic osteoarthritis especially in her left knee -PT OT eval -She may need SNF  COPD Chronic respiratory failure on 4 L home O2 -Stable, no wheezing continue nebs, ICS/LABA  History of CAD -Stable, continue aspirin, Plavix, metoprolol and nitrates -Decrease nitrate dose to avoid orthostatic hypotension  CKD 3a -Baseline creatinine around 1.6, now 1.7 -Stable, monitor  Type 2 diabetes mellitus -On 50 units of Lantus and NovoLog sliding scale at baseline -Restart Lantus at a lower dose  DVT prophylaxis: Lovenox Code Status: DNR per patients wishes according to her sister Elease Hashimoto Family Communication: No family at bedside, d/w sister Elease Hashimoto Disposition Plan:  Status is: Inpatient  Remains inpatient appropriate because:Inpatient level of care appropriate due to severity of illness   Dispo: The patient is from: ALF              Anticipated d/c is to: SNF              Anticipated d/c date is: unknown when Mental status more stable              Patient currently is not medically stable to d/c.   Difficult to place patient No  Consultants:      Procedures:   No outven`t. Antimicrobials:    Subjective: -No events overnight, had some visual hallucinations overnight  Objective: Vitals:   03/30/20 2057 03/30/20 2341 03/31/20 0437 03/31/20 1109  BP: (!) 157/83 (!) 148/81 122/85 139/71  Pulse: 72 80 77 83  Resp: 20 19 20 16   Temp: 98.3 F (36.8 C) 97.9 F (36.6 C) 98.3 F (36.8  C) 98.2 F (36.8 C)  TempSrc: Oral Oral Oral Oral  SpO2: 100% 99% 97% 95%  Weight:      Height:        Intake/Output Summary (Last 24 hours) at 03/31/2020 1438 Last data filed at 03/31/2020 1300 Gross per 24 hour  Intake 590 ml  Output 1600 ml  Net -1010 ml   Filed Weights   03/29/20 0626 03/30/20  0028  Weight: (!) 179.2 kg (!) 174.4 kg    Examination:  General exam: Morbidly obese female laying in bed, awake alert oriented to self and place, partly to time, no distress CVS: S1-S2, distant heart sounds Lungs: Distant breath sounds otherwise clear, no wheezing heard Abdomen: Obese, soft, massive abdominal pannus, nontender, bowel sounds present Extremities: No edema, multiple scabs in both lower legs and feet  Neuro: Moves all extremities, no localizing signs  Psychiatry: Mood & affect appropriate.     Data Reviewed:   CBC: Recent Labs  Lab 03/28/20 1019 03/29/20 0636 03/30/20 0520  WBC 5.0 7.4 6.0  NEUTROABS  --  6.0  --   HGB 8.2* 9.2* 8.3*  HCT 26.6* 29.5* 26.5*  MCV 91.4 90.2 90.4  PLT 134* 139* 135*   Basic Metabolic Panel: Recent Labs  Lab 03/28/20 1019 03/29/20 0636 03/30/20 0520 03/31/20 0540  NA 141 142 138 138  K 3.8 4.2 4.2 4.1  CL 105 104 102 104  CO2 25 26 27 25   GLUCOSE 62* 126* 251* 109*  BUN 29* 28* 29* 33*  CREATININE 1.62* 1.65* 1.78* 1.64*  CALCIUM 7.8* 8.1* 7.7* 8.0*   GFR: Estimated Creatinine Clearance: 57.6 mL/min (A) (by C-G formula based on SCr of 1.64 mg/dL (H)). Liver Function Tests: No results for input(s): AST, ALT, ALKPHOS, BILITOT, PROT, ALBUMIN in the last 168 hours. No results for input(s): LIPASE, AMYLASE in the last 168 hours. Recent Labs  Lab 03/30/20 1222  AMMONIA 16   Coagulation Profile: No results for input(s): INR, PROTIME in the last 168 hours. Cardiac Enzymes: No results for input(s): CKTOTAL, CKMB, CKMBINDEX, TROPONINI in the last 168 hours. BNP (last 3 results) No results for input(s): PROBNP in the last 8760 hours. HbA1C: Recent Labs    03/29/20 1305  HGBA1C 7.6*   CBG: Recent Labs  Lab 03/30/20 1148 03/30/20 1644 03/30/20 2127 03/31/20 0740 03/31/20 1107  GLUCAP 210* 202* 144* 114* 137*   Lipid Profile: No results for input(s): CHOL, HDL, LDLCALC, TRIG, CHOLHDL, LDLDIRECT in the last  72 hours. Thyroid Function Tests: Recent Labs    03/29/20 0636 03/29/20 1436  TSH 8.112*  --   FREET4  --  1.11   Anemia Panel: No results for input(s): VITAMINB12, FOLATE, FERRITIN, TIBC, IRON, RETICCTPCT in the last 72 hours. Urine analysis:    Component Value Date/Time   COLORURINE YELLOW (A) 03/29/2020 0948   APPEARANCEUR CLEAR (A) 03/29/2020 0948   LABSPEC 1.010 03/29/2020 0948   PHURINE 5.0 03/29/2020 0948   GLUCOSEU NEGATIVE 03/29/2020 0948   HGBUR SMALL (A) 03/29/2020 0948   BILIRUBINUR NEGATIVE 03/29/2020 0948   KETONESUR NEGATIVE 03/29/2020 0948   PROTEINUR >=300 (A) 03/29/2020 0948   NITRITE NEGATIVE 03/29/2020 0948   LEUKOCYTESUR NEGATIVE 03/29/2020 0948   Sepsis Labs: @LABRCNTIP (procalcitonin:4,lacticidven:4)  ) Recent Results (from the past 240 hour(s))  Urine culture     Status: Abnormal   Collection Time: 03/21/20  7:06 PM   Specimen: Urine, Random  Result Value Ref Range Status   Specimen Description   Final  URINE, RANDOM Performed at [facility], [address] [place], [place] [address]    Special Requests   Final    NONE Performed at [facility], [address] [place], [place] [address]    Culture >=100,000 COLONIES/mL ESCHERICHIA COLI (A)  Final   Report Status 03/24/2020 FINAL  Final   Organism ID, Bacteria ESCHERICHIA COLI (A)  Final      Susceptibility   Escherichia coli - MIC*    AMPICILLIN >=32 RESISTANT Resistant     CEFAZOLIN <=4 SENSITIVE Sensitive     CEFEPIME <=0.12 SENSITIVE Sensitive     CEFTRIAXONE <=0.25 SENSITIVE Sensitive     CIPROFLOXACIN <=0.25 SENSITIVE Sensitive     GENTAMICIN <=1 SENSITIVE Sensitive     IMIPENEM <=0.25 SENSITIVE Sensitive     NITROFURANTOIN <=16 SENSITIVE Sensitive     TRIMETH/SULFA <=20 SENSITIVE Sensitive     AMPICILLIN/SULBACTAM 4 SENSITIVE Sensitive     PIP/TAZO <=4 SENSITIVE Sensitive     * >=100,000 COLONIES/mL ESCHERICHIA COLI  SARS CORONAVIRUS 2 (TAT 6-24 HRS)  Nasopharyngeal Nasopharyngeal Swab     Status: None   Collection Time: 03/29/20  3:35 PM   Specimen: Nasopharyngeal Swab  Result Value Ref Range Status   SARS Coronavirus 2 NEGATIVE NEGATIVE Final    Comment: (NOTE) SARS-CoV-2 target nucleic acids are NOT DETECTED.  The SARS-CoV-2 RNA is generally detectable in upper and lower respiratory specimens during the acute phase of infection. Negative results do not preclude SARS-CoV-2 infection, do not rule out co-infections with other pathogens, and should not be used as the sole basis for treatment or other patient management decisions. Negative results must be combined with clinical observations, patient history, and epidemiological information. The expected result is Negative.  Fact Sheet for Patients: HairSlick.no  Fact Sheet for Healthcare Providers: quierodirigir.com  This test is not yet approved or cleared by the [place] FDA and  has been authorized for detection and/or diagnosis of SARS-CoV-2 by FDA under an Emergency Use Authorization (EUA). This EUA will remain  in effect (meaning this test can be used) for the duration of the COVID-19 declaration under Se ction 564(b)(1) of the Act, 21 U.S.C. section 360bbb-3(b)(1), unless the authorization is terminated or revoked sooner.  Performed at [facility], [address] [place], [place] [address]   MRSA PCR Screening     Status: None   Collection Time: 03/30/20  2:05 AM   Specimen: Nasal Mucosa; Nasopharyngeal  Result Value Ref Range Status   MRSA by PCR NEGATIVE NEGATIVE Final    Comment:        The GeneXpert MRSA Assay (FDA approved for NASAL specimens only), is one component of a comprehensive MRSA colonization surveillance program. It is not intended to diagnose MRSA infection nor to guide or monitor treatment for MRSA infections. Performed at [facility], [address]  [place], [place] [address]     Scheduled Meds: . allopurinol  100 mg Oral Daily  . aspirin EC  81 mg Oral Daily  . atorvastatin  40 mg Oral Daily  . diazepam  5 mg Oral Once  . diclofenac sodium  2 g Topical TID  . DULoxetine  60 mg Oral BID  . enoxaparin (LOVENOX) injection  0.5 mg/kg Subcutaneous Q24H  . fluticasone  2 spray Each Nare Daily  . insulin aspart  0-20 Units Subcutaneous TID WC  . insulin glargine  30 Units Subcutaneous Daily  . isosorbide mononitrate  15 mg Oral Daily  .  loratadine  10 mg Oral Daily  . melatonin  10 mg Oral QHS  . metoprolol succinate  25 mg Oral Daily  . mometasone-formoterol  2 puff Inhalation BID  . multivitamin with minerals  1 tablet Oral Daily  . nystatin   Topical BID  . sodium chloride flush  3 mL Intravenous Q12H   Continuous Infusions: . sodium chloride 250 mL (03/30/20 1304)  . cefTRIAXone (ROCEPHIN)  IV 1 g (03/30/20 1306)     LOS: 2 days    Time spent:  Zannie Cove, MD [facility] 03/31/2020, 2:38 PM

## 2020-03-31 NOTE — TOC Progression Note (Signed)
Transition of Care [facility]) - Progression Note    Patient Details  Name: Deanna Rice MRN: [id number] Date of Birth: 10-09-1955  Transition of Care [facility]) CM/SW Contact  Chapman Fitch, RN Phone Number: 03/31/2020, 9:51 AM  Clinical Narrative:      PT recommending SNF Spoke with sister.  She is agreement  Pasrr went to level 2 Fl2 and 30day note sent for signature Bed search initiated  Expected Discharge Plan: Skilled Nursing Facility Barriers to Discharge: Continued Medical Work up  Expected Discharge Plan and Services Expected Discharge Plan: Skilled Nursing Facility In-house Referral: Clinical Social Work   Post Acute Care Choice: Skilled Nursing Facility Living arrangements for the past 2 months: Assisted Living Facility [employer])                                       Social Determinants of Health (SDOH) Interventions    Readmission Risk Interventions Readmission Risk Prevention Plan 03/31/2020  Transportation Screening Complete  Palliative Care Screening Not Applicable  Medication Review (RN Care Manager) Complete  Some recent data might be hidden

## 2020-03-31 NOTE — NC FL2 (Signed)
[place] MEDICAID FL2 LEVEL OF CARE SCREENING TOOL     IDENTIFICATION  Patient Name: Deanna Rice Birthdate: 1955-04-08 Sex: female Admission Date (Current Location): 03/29/2020  [place] and [place] Number:  Chiropodist and Address:  [facility] [address] [place], [place] [address]      Provider Number: [id number]  Attending Physician Name and Address:  Zannie Cove, MD  Relative Name and Phone Number:       Current Level of Care: Hospital Recommended Level of Care: Skilled Nursing Facility Prior Approval Number:    Date Approved/Denied:   PASRR Number: pending  Discharge Plan: SNF    Current Diagnoses: Patient Active Problem List   Diagnosis Date Noted  . Acute metabolic encephalopathy 03/29/2020  . Primary hypothyroidism   . Type 2 diabetes mellitus with stage 3 chronic kidney disease (HCC)   . Acute lower UTI   . Chest pain 07/04/2019  . Furuncle of vulva 02/28/2019  . Cellulitis of left lower extremity 12/31/2018  . Diabetic ulcer of left lower leg associated with type 2 diabetes mellitus (HCC) 12/31/2018  . Uncontrolled type 2 diabetes mellitus with hyperglycemia (HCC) 12/31/2018  . Chronic pain syndrome 08/03/2017  . Oxygen dependent 08/03/2017  . Thrombocytopenia (HCC) 09/28/2015  . Anemia in chronic kidney disease 09/28/2015  . Diarrhea 11/28/2014  . AKI (acute kidney injury) (HCC) 11/28/2014  . COPD (chronic obstructive pulmonary disease) (HCC) 11/28/2014  . Uncontrolled type 2 diabetes mellitus with hypoglycemia (HCC) 11/28/2014  . HTN (hypertension) 11/28/2014    Orientation RESPIRATION BLADDER Height & Weight     Place,Self  O2 (4L) Incontinent Weight: (!) 174.4 kg Height:  5' 7.01" (170.2 cm)  BEHAVIORAL SYMPTOMS/MOOD NEUROLOGICAL BOWEL NUTRITION STATUS      Continent Diet (carb modified)  AMBULATORY STATUS COMMUNICATION OF NEEDS Skin   Extensive Assist (Wheelchair bound) Verbally Normal                        Personal Care Assistance Level of Assistance              Functional Limitations Info             SPECIAL CARE FACTORS FREQUENCY  PT (By licensed PT),OT (By licensed OT)                    Contractures Contractures Info: Not present    Additional Factors Info  Code Status,Allergies Code Status Info: Full Allergies Info: NKDA           Current Medications (03/31/2020):  This is the current hospital active medication list Current Facility-Administered Medications  Medication Dose Route Frequency Provider Last Rate Last Admin  . 0.9 %  sodium chloride infusion  250 mL Intravenous PRN Agbata, Tochukwu, MD 10 mL/hr at 03/30/20 1304 250 mL at 03/30/20 1304  . allopurinol (ZYLOPRIM) tablet 100 mg  100 mg Oral Daily Agbata, Tochukwu, MD   100 mg at 03/31/20 0931  . aspirin EC tablet 81 mg  81 mg Oral Daily Agbata, Tochukwu, MD   81 mg at 03/31/20 0931  . atorvastatin (LIPITOR) tablet 40 mg  40 mg Oral Daily Agbata, Tochukwu, MD   40 mg at 03/31/20 0932  . cefTRIAXone (ROCEPHIN) 1 g in sodium chloride 0.9 % 100 mL IVPB  1 g Intravenous Q24H Agbata, Tochukwu, MD 200 mL/hr at 03/30/20 1306 1 g at 03/30/20 1306  . diclofenac sodium (VOLTAREN) 1 % transdermal gel  2 g  2 g Topical TID Agbata, Tochukwu, MD   2 g at 03/31/20 0932  . DULoxetine (CYMBALTA) DR capsule 60 mg  60 mg Oral BID Agbata, Tochukwu, MD   60 mg at 03/31/20 0932  . enoxaparin (LOVENOX) injection 90 mg  0.5 mg/kg Subcutaneous Q24H Tressie Ellis, RPH   90 mg at 03/30/20 2258  . fluticasone (FLONASE) 50 MCG/ACT nasal spray 2 spray  2 spray Each Nare Daily Agbata, Tochukwu, MD   2 spray at 03/31/20 0933  . insulin aspart (novoLOG) injection 0-20 Units  0-20 Units Subcutaneous TID WC Agbata, Tochukwu, MD   7 Units at 03/30/20 1724  . insulin glargine (LANTUS) injection 30 Units  30 Units Subcutaneous Daily Zannie Cove, MD   30 Units at 03/30/20 1321  . isosorbide mononitrate (IMDUR) 24 hr tablet  15 mg  15 mg Oral Daily Zannie Cove, MD   15 mg at 03/31/20 0931  . loratadine (CLARITIN) tablet 10 mg  10 mg Oral Daily Agbata, Tochukwu, MD   10 mg at 03/31/20 0932  . melatonin tablet 10 mg  10 mg Oral QHS Agbata, Tochukwu, MD   10 mg at 03/30/20 2142  . metoprolol succinate (TOPROL-XL) 24 hr tablet 25 mg  25 mg Oral Daily Agbata, Tochukwu, MD   25 mg at 03/31/20 0932  . mometasone-formoterol (DULERA) 200-5 MCG/ACT inhaler 2 puff  2 puff Inhalation BID Agbata, Tochukwu, MD   2 puff at 03/31/20 0933  . multivitamin with minerals tablet 1 tablet  1 tablet Oral Daily Agbata, Tochukwu, MD   1 tablet at 03/31/20 0931  . nitroGLYCERIN (NITROLINGUAL) 0.4 MG/SPRAY spray 1 spray  1 spray Sublingual Q5 min PRN Agbata, Tochukwu, MD      . nystatin (MYCOSTATIN/NYSTOP) topical powder   Topical BID Andris Baumann, MD   Given at 03/31/20 [phone]  . ondansetron (ZOFRAN) tablet 4 mg  4 mg Oral Q6H PRN Agbata, Tochukwu, MD       Or  . ondansetron (ZOFRAN) injection 4 mg  4 mg Intravenous Q6H PRN Agbata, Tochukwu, MD      . sodium chloride flush (NS) 0.9 % injection 3 mL  3 mL Intravenous Q12H Agbata, Tochukwu, MD   3 mL at 03/31/20 0933  . sodium chloride flush (NS) 0.9 % injection 3 mL  3 mL Intravenous PRN Agbata, Tochukwu, MD      . traMADol (ULTRAM) tablet 50 mg  50 mg Oral Q12H PRN Agbata, Tochukwu, MD   50 mg at 03/29/20 2341     Discharge Medications: Please see discharge summary for a list of discharge medications.  Relevant Imaging Results:  Relevant Lab Results:   Additional Information ss [id number]  Chapman Fitch, RN

## 2020-03-31 NOTE — Care Management (Signed)
RE: Deanna Rice Date of Birth: 05/28/1955 Date: 03/31/20  To Whom It May Concern:  Please be advised that the above-named patient will require a short-term nursing home stay - anticipated 30 days or less for rehabilitation and strengthening.  The plan is for return home.

## 2020-03-31 NOTE — TOC Progression Note (Signed)
Transition of Care [facility]) - Progression Note    Patient Details  Name: Deanna Rice MRN: [id number] Date of Birth: [birth year]  Transition of Care [facility] CM/SW Contact  Chapman Fitch, RN Phone Number: 03/31/2020, 2:16 PM  Clinical Narrative:     Clinical uploaded to [place] must for level 2 PASRR [place] house confirmed patient is vaccinated, and to fax copy of records   Expected Discharge Plan: Skilled Nursing Facility Barriers to Discharge: Continued Medical Work up  Expected Discharge Plan and Services Expected Discharge Plan: Skilled Nursing Facility In-house Referral: Clinical Social Work   Post Acute Care Choice: Skilled Nursing Facility Living arrangements for the past 2 months: Assisted Living Facility [employer])                                       Social Determinants of Health (SDOH) Interventions    Readmission Risk Interventions Readmission Risk Prevention Plan 03/31/2020  Transportation Screening Complete  Palliative Care Screening Not Applicable  Medication Review (RN Care Manager) Complete  Some recent data might be hidden

## 2020-03-31 NOTE — Plan of Care (Signed)
Continuing with plan of care. 

## 2020-03-31 NOTE — Care Management Important Message (Signed)
Important Message  Patient Details  Name: Deanna Rice MRN: [id number] Date of Birth: 10/09/1955   Medicare Important Message Given:  Yes     Deanna Rice 03/31/2020, 11:39 AM

## 2020-03-31 NOTE — Progress Notes (Signed)
PT Cancellation Note  Patient Details Name: Deanna Rice MRN: [id number] DOB: 03-11-1955   Cancelled Treatment:    Reason Eval/Treat Not Completed: Medical issues which prohibited therapy (Pt currently unable to be calmed, yelling and screaming. Will be administered sedation medications.)   [name] A Arcadio Cope 03/31/2020, 2:24 PM

## 2020-04-01 DIAGNOSIS — N390 Urinary tract infection, site not specified: Secondary | ICD-10-CM | POA: Diagnosis not present

## 2020-04-01 DIAGNOSIS — M25562 Pain in left knee: Secondary | ICD-10-CM | POA: Diagnosis not present

## 2020-04-01 DIAGNOSIS — G9341 Metabolic encephalopathy: Secondary | ICD-10-CM | POA: Diagnosis not present

## 2020-04-01 LAB — COMPREHENSIVE METABOLIC PANEL
ALT: 17 U/L (ref 0–44)
AST: 18 U/L (ref 15–41)
Albumin: 2.9 g/dL — ABNORMAL LOW (ref 3.5–5.0)
Alkaline Phosphatase: 81 U/L (ref 38–126)
Anion gap: 12 (ref 5–15)
BUN: 29 mg/dL — ABNORMAL HIGH (ref 8–23)
CO2: 25 mmol/L (ref 22–32)
Calcium: 8.3 mg/dL — ABNORMAL LOW (ref 8.9–10.3)
Chloride: 104 mmol/L (ref 98–111)
Creatinine, Ser: 1.54 mg/dL — ABNORMAL HIGH (ref 0.44–1.00)
GFR, Estimated: 37 mL/min — ABNORMAL LOW (ref >60)
Glucose, Bld: 129 mg/dL — ABNORMAL HIGH (ref 70–99)
Potassium: 4.1 mmol/L (ref 3.5–5.1)
Sodium: 141 mmol/L (ref 135–145)
Total Bilirubin: 0.7 mg/dL (ref 0.3–1.2)
Total Protein: 5.9 g/dL — ABNORMAL LOW (ref 6.5–8.1)

## 2020-04-01 LAB — CBC
HCT: 26.7 % — ABNORMAL LOW (ref 36.0–46.0)
Hemoglobin: 8.2 g/dL — ABNORMAL LOW (ref 12.0–15.0)
MCH: 28.1 pg (ref 26.0–34.0)
MCHC: 30.7 g/dL (ref 30.0–36.0)
MCV: 91.4 fL (ref 80.0–100.0)
Platelets: 128 10*3/uL — ABNORMAL LOW (ref 150–400)
RBC: 2.92 MIL/uL — ABNORMAL LOW (ref 3.87–5.11)
RDW: 17.2 % — ABNORMAL HIGH (ref 11.5–15.5)
WBC: 5 10*3/uL (ref 4.0–10.5)
nRBC: 0 % (ref 0.0–0.2)

## 2020-04-01 LAB — GLUCOSE, CAPILLARY
Glucose-Capillary: 124 mg/dL — ABNORMAL HIGH (ref 70–99)
Glucose-Capillary: 159 mg/dL — ABNORMAL HIGH (ref 70–99)
Glucose-Capillary: 256 mg/dL — ABNORMAL HIGH (ref 70–99)
Glucose-Capillary: 329 mg/dL — ABNORMAL HIGH (ref 70–99)

## 2020-04-01 MED ORDER — INSULIN ASPART 100 UNIT/ML ~~LOC~~ SOLN
0.0000 [IU] | Freq: Three times a day (TID) | SUBCUTANEOUS | Status: DC
  Administered 2020-04-02 (×3): 7 [IU] via SUBCUTANEOUS
  Administered 2020-04-03: 4 [IU] via SUBCUTANEOUS
  Administered 2020-04-03 (×2): 7 [IU] via SUBCUTANEOUS
  Administered 2020-04-04: 11 [IU] via SUBCUTANEOUS
  Administered 2020-04-04 (×2): 7 [IU] via SUBCUTANEOUS
  Administered 2020-04-05 (×2): 3 [IU] via SUBCUTANEOUS
  Administered 2020-04-05 – 2020-04-06 (×2): 4 [IU] via SUBCUTANEOUS
  Administered 2020-04-06 (×2): 3 [IU] via SUBCUTANEOUS
  Administered 2020-04-07: 7 [IU] via SUBCUTANEOUS
  Administered 2020-04-07 (×2): 3 [IU] via SUBCUTANEOUS
  Administered 2020-04-08: 4 [IU] via SUBCUTANEOUS
  Administered 2020-04-08 – 2020-04-09 (×5): 7 [IU] via SUBCUTANEOUS
  Administered 2020-04-10: 3 [IU] via SUBCUTANEOUS
  Administered 2020-04-10: 7 [IU] via SUBCUTANEOUS
  Administered 2020-04-10: 4 [IU] via SUBCUTANEOUS
  Administered 2020-04-11: 11 [IU] via SUBCUTANEOUS
  Administered 2020-04-11: 7 [IU] via SUBCUTANEOUS
  Administered 2020-04-11: 11 [IU] via SUBCUTANEOUS
  Administered 2020-04-12: 4 [IU] via SUBCUTANEOUS
  Administered 2020-04-12: 7 [IU] via SUBCUTANEOUS
  Administered 2020-04-13 (×2): 3 [IU] via SUBCUTANEOUS
  Filled 2020-04-01 (×36): qty 1

## 2020-04-01 MED ORDER — INSULIN ASPART 100 UNIT/ML ~~LOC~~ SOLN
0.0000 [IU] | Freq: Every day | SUBCUTANEOUS | Status: DC
  Administered 2020-04-01 – 2020-04-02 (×2): 4 [IU] via SUBCUTANEOUS
  Administered 2020-04-03 – 2020-04-04 (×2): 3 [IU] via SUBCUTANEOUS
  Administered 2020-04-07: 2 [IU] via SUBCUTANEOUS
  Administered 2020-04-09: 3 [IU] via SUBCUTANEOUS
  Administered 2020-04-10: 4 [IU] via SUBCUTANEOUS
  Administered 2020-04-12: 3 [IU] via SUBCUTANEOUS
  Administered 2020-04-12: 2 [IU] via SUBCUTANEOUS
  Filled 2020-04-01 (×9): qty 1

## 2020-04-01 NOTE — Progress Notes (Signed)
PROGRESS NOTE    Deanna Rice  FVC:[id number] DOB: 02/28/1955 DOA: 03/29/2020 PCP: Lyndon Code, MD  Brief Narrative: This is a chronically ill morbidly obese female BMI of 60, type 2 diabetes mellitus, COPD/chronic respiratory failure on 4 L home O2, chronic venous insufficiency, hypothyroidism, chronic diastolic CHF, chronic osteoarthritis, depression was brought to the ER after he had another fall, she resides at a local assisted living facility and usually ambulates only short distances with an assist device, otherwise uses a wheelchair has been seen in the ER multiple times for recurrent falls, while in the emergency room she was noted to have visual hallucinations.  Work-up in the emergency room was relatively unrevealing except for abnormal urinalysis, ABG ruled out hypercarbia, no leukocytosis, CT head without acute findings, chest x-ray noted mild pulmonary venous congestion and basilar atelectasis she also had an MRI of her left knee which showed severe degenerative changes.  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  Assessment & Plan:   Visual hallucinations Encephalopathy -Had visual hallucinations on 2/2 and 2/3, on 2/4 had significantly increased agitation requiring Haldol x2 -Etiology is unclear, urinalysis not very impressive however recently grew pansensitive E. coli from urine culture last week -Day 4 of ceftriaxone today, discontinue after tomorrow's dose -History not suggestive of any medication withdrawal, ammonia level within normal limits -TSH is high but free T4 is normal, will need thyroid function test repeated in 4 to 6 weeks -PT OT eval completed, SNF recommended for rehab -Discharge planning, suspect she needs long-term SNF  Possible UTI -See above, on day 4/5 of Ceftriaxone -Unfortunately urine culture did not get collected on admission  Morbid obesity -BMI 60.2 -Most significant comorbidity,  contributing to ongoing functional decline  Frequent falls -Suspect this is secondary to morbid obesity, overall gait disorder and chronic osteoarthritis especially in her left knee -PT OT eval -She may need SNF  COPD Chronic respiratory failure on 4 L home O2 -Stable, no wheezing continue nebs, ICS/LABA  History of CAD -Stable, continue aspirin, Plavix, metoprolol and nitrates -Decrease nitrate dose to avoid orthostatic hypotension  CKD 3a -Baseline creatinine around 1.6, now 1.7 -Stable, monitor  Type 2 diabetes mellitus -On 50 units of Lantus and NovoLog sliding scale at baseline -CBG stable, now on 20 units of Lantus  DVT prophylaxis: Lovenox Code Status: DNR per patients wishes and sister Elease Hashimoto Family Communication: No family at bedside, updated Sister Elease Hashimoto yesterday Disposition Plan:  Status is: Inpatient  Remains inpatient appropriate because:Inpatient level of care appropriate due to severity of illness   Dispo: The patient is from: ALF              Anticipated d/c is to: SNF              Anticipated d/c date is: Hopefully Monday              Patient currently is not medically stable to d/c.   Difficult to place patient No  Consultants:      Procedures:   No outven`t. Antimicrobials:    Subjective: -Had increased agitation and hallucinations yesterday afternoon and evening, finally slept after getting 2 doses of Haldol, pleasant and appropriate this morning  Objective: Vitals:   03/31/20 1952 04/01/20 0019 04/01/20 0412 04/01/20 1110  BP:  120/83 (!) 170/84 (!) 161/73  Pulse:  75 78 82  Resp:  16 16 15   Temp:  98.3 F (36.8 C) 97.6 F (36.4  C) 98.6 F (37 C)  TempSrc:   Oral Oral  SpO2: 98% 98% 99% 99%  Weight:      Height:        Intake/Output Summary (Last 24 hours) at 04/01/2020 1332 Last data filed at 04/01/2020 1235 Gross per 24 hour  Intake 240 ml  Output 1300 ml  Net -1060 ml   Filed Weights   03/29/20 0626 03/30/20 0028   Weight: (!) 179.2 kg (!) 174.4 kg    Examination:  General exam: Morbidly obese female laying in bed, awake and alert today, oriented to self, place and partly to time, no distress CVS: S1-S2, distant heart sounds Lungs: Distant breath sounds, otherwise clear no wheezing heard Abdomen: Soft, obese, mass abdominal part pannus, nontender, bowel sounds present Extremities: No edema, multiple scabs in both lower legs and feet Neuro: Moves all extremities, no localizing signs  Psychiatry: Mood & affect appropriate.     Data Reviewed:   CBC: Recent Labs  Lab 03/28/20 1019 03/29/20 0636 03/30/20 0520 04/01/20 0839  WBC 5.0 7.4 6.0 5.0  NEUTROABS  --  6.0  --   --   HGB 8.2* 9.2* 8.3* 8.2*  HCT 26.6* 29.5* 26.5* 26.7*  MCV 91.4 90.2 90.4 91.4  PLT 134* 139* 135* 128*   Basic Metabolic Panel: Recent Labs  Lab 03/28/20 1019 03/29/20 0636 03/30/20 0520 03/31/20 0540 04/01/20 0839  NA 141 142 138 138 141  K 3.8 4.2 4.2 4.1 4.1  CL 105 104 102 104 104  CO2 25 26 27 25 25   GLUCOSE 62* 126* 251* 109* 129*  BUN 29* 28* 29* 33* 29*  CREATININE 1.62* 1.65* 1.78* 1.64* 1.54*  CALCIUM 7.8* 8.1* 7.7* 8.0* 8.3*   GFR: Estimated Creatinine Clearance: 61.3 mL/min (A) (by C-G formula based on SCr of 1.54 mg/dL (H)). Liver Function Tests: Recent Labs  Lab 04/01/20 0839  AST 18  ALT 17  ALKPHOS 81  BILITOT 0.7  PROT 5.9*  ALBUMIN 2.9*   No results for input(s): LIPASE, AMYLASE in the last 168 hours. Recent Labs  Lab 03/30/20 1222  AMMONIA 16   Coagulation Profile: No results for input(s): INR, PROTIME in the last 168 hours. Cardiac Enzymes: No results for input(s): CKTOTAL, CKMB, CKMBINDEX, TROPONINI in the last 168 hours. BNP (last 3 results) No results for input(s): PROBNP in the last 8760 hours. HbA1C: No results for input(s): HGBA1C in the last 72 hours. CBG: Recent Labs  Lab 03/31/20 1107 03/31/20 1548 03/31/20 2214 04/01/20 0809 04/01/20 1227  GLUCAP  137* 145* 176* 124* 159*   Lipid Profile: No results for input(s): CHOL, HDL, LDLCALC, TRIG, CHOLHDL, LDLDIRECT in the last 72 hours. Thyroid Function Tests: Recent Labs    03/29/20 1436  FREET4 1.11   Anemia Panel: No results for input(s): VITAMINB12, FOLATE, FERRITIN, TIBC, IRON, RETICCTPCT in the last 72 hours. Urine analysis:    Component Value Date/Time   COLORURINE YELLOW (A) 03/29/2020 0948   APPEARANCEUR CLEAR (A) 03/29/2020 0948   LABSPEC 1.010 03/29/2020 0948   PHURINE 5.0 03/29/2020 0948   GLUCOSEU NEGATIVE 03/29/2020 0948   HGBUR SMALL (A) 03/29/2020 0948   BILIRUBINUR NEGATIVE 03/29/2020 0948   KETONESUR NEGATIVE 03/29/2020 0948   PROTEINUR >=300 (A) 03/29/2020 0948   NITRITE NEGATIVE 03/29/2020 0948   LEUKOCYTESUR NEGATIVE 03/29/2020 0948   Sepsis Labs: @LABRCNTIP (procalcitonin:4,lacticidven:4)  ) Recent Results (from the past 240 hour(s))  SARS CORONAVIRUS 2 (TAT 6-24 HRS) Nasopharyngeal Nasopharyngeal Swab     Status: None  Collection Time: 03/29/20  3:35 PM   Specimen: Nasopharyngeal Swab  Result Value Ref Range Status   SARS Coronavirus 2 NEGATIVE NEGATIVE Final    Comment: (NOTE) SARS-CoV-2 target nucleic acids are NOT DETECTED.  The SARS-CoV-2 RNA is generally detectable in upper and lower respiratory specimens during the acute phase of infection. Negative results do not preclude SARS-CoV-2 infection, do not rule out co-infections with other pathogens, and should not be used as the sole basis for treatment or other patient management decisions. Negative results must be combined with clinical observations, patient history, and epidemiological information. The expected result is Negative.  Fact Sheet for Patients: HairSlick.no  Fact Sheet for Healthcare Providers: quierodirigir.com  This test is not yet approved or cleared by the [place] FDA and  has been authorized for detection  and/or diagnosis of SARS-CoV-2 by FDA under an Emergency Use Authorization (EUA). This EUA will remain  in effect (meaning this test can be used) for the duration of the COVID-19 declaration under Se ction 564(b)(1) of the Act, 21 U.S.C. section 360bbb-3(b)(1), unless the authorization is terminated or revoked sooner.  Performed at [facility], [address], [place], [place] [address]   MRSA PCR Screening     Status: None   Collection Time: 03/30/20  2:05 AM   Specimen: Nasal Mucosa; Nasopharyngeal  Result Value Ref Range Status   MRSA by PCR NEGATIVE NEGATIVE Final    Comment:        The GeneXpert MRSA Assay (FDA approved for NASAL specimens only), is one component of a comprehensive MRSA colonization surveillance program. It is not intended to diagnose MRSA infection nor to guide or monitor treatment for MRSA infections. Performed at [facility], [address] [place], [place] [address]     Scheduled Meds: . allopurinol  100 mg Oral Daily  . aspirin EC  81 mg Oral Daily  . atorvastatin  40 mg Oral Daily  . diclofenac sodium  2 g Topical TID  . DULoxetine  60 mg Oral BID  . enoxaparin (LOVENOX) injection  0.5 mg/kg Subcutaneous Q24H  . fluticasone  2 spray Each Nare Daily  . insulin aspart  0-20 Units Subcutaneous TID WC  . insulin glargine  20 Units Subcutaneous Daily  . isosorbide mononitrate  15 mg Oral Daily  . loratadine  10 mg Oral Daily  . melatonin  10 mg Oral QHS  . metoprolol succinate  25 mg Oral Daily  . mometasone-formoterol  2 puff Inhalation BID  . multivitamin with minerals  1 tablet Oral Daily  . nystatin   Topical BID  . sodium chloride flush  3 mL Intravenous Q12H   Continuous Infusions: . sodium chloride 250 mL (03/30/20 1304)     LOS: 3 days   Time spent:  Zannie Cove, MD [facility] 04/01/2020, 1:32 PM

## 2020-04-01 NOTE — Plan of Care (Signed)
Continuing with plan of care. 

## 2020-04-02 DIAGNOSIS — M25562 Pain in left knee: Secondary | ICD-10-CM | POA: Diagnosis not present

## 2020-04-02 DIAGNOSIS — N390 Urinary tract infection, site not specified: Secondary | ICD-10-CM | POA: Diagnosis not present

## 2020-04-02 DIAGNOSIS — G9341 Metabolic encephalopathy: Secondary | ICD-10-CM | POA: Diagnosis not present

## 2020-04-02 LAB — GLUCOSE, CAPILLARY
Glucose-Capillary: 228 mg/dL — ABNORMAL HIGH (ref 70–99)
Glucose-Capillary: 213 mg/dL — ABNORMAL HIGH (ref 70–99)
Glucose-Capillary: 219 mg/dL — ABNORMAL HIGH (ref 70–99)
Glucose-Capillary: 306 mg/dL — ABNORMAL HIGH (ref 70–99)

## 2020-04-02 NOTE — Progress Notes (Signed)
Patient seen and examined, Remains stable, without further episodes of agitation or hallucination Is chronically debilitated and morbidly obese with frequent falls, now awaiting SNF  Zannie Cove, MD

## 2020-04-02 NOTE — Plan of Care (Signed)
Continuing with plan of care. 

## 2020-04-02 NOTE — Progress Notes (Signed)
Physical Therapy Treatment Patient Details Name: Deanna Rice MRN: [id number] DOB: [birth year] Today's Date: 04/02/2020    History of Present Illness Patient is a 65 y.o. female with medical history significant for morbid obesity, diabetes mellitus, COPD with chronic respiratory failure, venous insufficiency, hypothyroidism, depression and CHF who was brought into the ER for evaluation following a fall with history of multiple falls. Noted to have visual hallucinations while in the ED. Acute metabolic encephalopathy and possible UTI    PT Comments    Patient alert and oriented this session and did not mention any hallucinations. Agreed to PT and reported no pain. Patient required min A to come supine to sit with HOB elevated with left hand held support. Practiced transfer training with CGA, 5 reps to RW focusing on proper sequencing and use of RW. Ambulated 2x5 feet with RW and CGA with 2nd person for close chair follow due to potential for buckling and sudden fatigue.  Patient's nasal cannula tubing found to have a hole, so it was replaced. Patient on 4L/min O2 throughout session. SpO2 dipped as low as 86% during one mobility attempt but recovered quickly with rest. Patient would benefit from continued skilled physical therapy to address remaining impairments and functional limitations to work towards stated goals and return to PLOF or maximal functional independence.      Follow Up Recommendations  SNF     Equipment Recommendations   (to be determined at next level of care)    Recommendations for Other Services       Precautions / Restrictions Precautions Precautions: Fall Restrictions Weight Bearing Restrictions: No    Mobility  Bed Mobility Overal bed mobility: Needs Assistance Bed Mobility: Supine to Sit     Supine to sit: Min assist     General bed mobility comments: Min A for left hand held assist to pull herself to edge of bed.  Transfers Overall transfer level: Needs  assistance Equipment used: Rolling walker (2 wheeled) Transfers: Sit to/from Stand Sit to Stand: Min guard        Lateral/Scoot Transfers: Min guard General transfer comment: CGA for safety and cuing for sequencing and hand placement with safe use of RW. Completed one rep at edge of bed, one rep bed to chair and 3 reps chair to chair.  Ambulation/Gait Ambulation/Gait assistance: Min guard;+2 safety/equipment Gait Distance (Feet): 5 Feet (2x5 with close chair follow) Assistive device: Rolling walker (2 wheeled) Gait Pattern/deviations: Step-through pattern;Trunk flexed;Decreased stride length;Wide base of support Gait velocity: very slow   General Gait Details: Patient ambulated to bouts of 5 feet using RW and very close chair follow. Requested to sit after 5 feet due to quick fatigue.   Stairs             Wheelchair Mobility    Modified Rankin (Stroke Patients Only)       Balance Overall balance assessment: Needs assistance Sitting-balance support: Feet supported Sitting balance-Leahy Scale: Good Sitting balance - Comments: no loss of balance in sitting position   Standing balance support: Bilateral upper extremity supported;During functional activity Standing balance-Leahy Scale: Poor Standing balance comment: dependent on RW for supoprt                            Cognition Arousal/Alertness: Awake/alert Behavior During Therapy: WFL for tasks assessed/performed Overall Cognitive Status: Within Functional Limits for tasks assessed  General Comments: patient is alert and oreinted x 4 and following all single step commands without difficulty. no mention of hallucinations      Exercises Other Exercises Other Exercises: practiced several reps of sit <> stand (total 5) with focus on proper sequencing and safe use of RW. Required CGA for safety.    General Comments General comments (skin integrity, edema,  etc.): Patient using 4L/min O2 throughout session but hole was found in tubing during session. SpO2 breifly dropped as low as 86% during mobility but recovered to mid-to upper 90s with rest. faulty tubing replaced.      Pertinent Vitals/Pain Pain Assessment: No/denies pain    Home Living                      Prior Function            PT Goals (current goals can now be found in the care plan section) Acute Rehab PT Goals Patient Stated Goal: to go home PT Goal Formulation: With patient Time For Goal Achievement: 04/13/20 Potential to Achieve Goals: Fair Progress towards PT goals: Progressing toward goals    Frequency    Min 2X/week      PT Plan Current plan remains appropriate    Co-evaluation              AM-PAC PT "6 Clicks" Mobility   Outcome Measure  Help needed turning from your back to your side while in a flat bed without using bedrails?: A Little Help needed moving from lying on your back to sitting on the side of a flat bed without using bedrails?: A Little Help needed moving to and from a bed to a chair (including a wheelchair)?: A Little Help needed standing up from a chair using your arms (e.g., wheelchair or bedside chair)?: A Little Help needed to walk in hospital room?: A Lot Help needed climbing 3-5 steps with a railing? : Total 6 Click Score: 15    End of Session Equipment Utilized During Treatment: Gait belt;Oxygen (4L/min) Activity Tolerance: Patient limited by fatigue;No increased pain;Patient tolerated treatment well Patient left: in bed;with call bell/phone within reach;in chair;with chair alarm set Nurse Communication: Mobility status PT Visit Diagnosis: Repeated falls (R29.6);Muscle weakness (generalized) (M62.81);Difficulty in walking, not elsewhere classified (R26.2)     Time: 9604-5409 PT Time Calculation (min) (ACUTE ONLY): 40 min  Charges:  $Gait Training: 8-22 mins $Therapeutic Activity: 23-37 mins                      Luretha Murphy. Ilsa Iha, PT, DPT 04/02/20, 9:52 AM

## 2020-04-03 DIAGNOSIS — N390 Urinary tract infection, site not specified: Secondary | ICD-10-CM | POA: Diagnosis not present

## 2020-04-03 DIAGNOSIS — M25562 Pain in left knee: Secondary | ICD-10-CM | POA: Diagnosis not present

## 2020-04-03 DIAGNOSIS — G9341 Metabolic encephalopathy: Secondary | ICD-10-CM | POA: Diagnosis not present

## 2020-04-03 LAB — GLUCOSE, CAPILLARY
Glucose-Capillary: 233 mg/dL — ABNORMAL HIGH (ref 70–99)
Glucose-Capillary: 223 mg/dL — ABNORMAL HIGH (ref 70–99)
Glucose-Capillary: 179 mg/dL — ABNORMAL HIGH (ref 70–99)
Glucose-Capillary: 255 mg/dL — ABNORMAL HIGH (ref 70–99)

## 2020-04-03 MED ORDER — INSULIN GLARGINE 100 UNIT/ML ~~LOC~~ SOLN
25.0000 [IU] | Freq: Every day | SUBCUTANEOUS | Status: DC
  Administered 2020-04-03: 25 [IU] via SUBCUTANEOUS
  Filled 2020-04-03: qty 0.25

## 2020-04-03 MED ORDER — INSULIN GLARGINE 100 UNIT/ML ~~LOC~~ SOLN
30.0000 [IU] | Freq: Every day | SUBCUTANEOUS | Status: DC
  Filled 2020-04-03: qty 0.3

## 2020-04-03 NOTE — Care Management Important Message (Signed)
Important Message  Patient Details  Name: Deanna Rice MRN: [id number] Date of Birth: January 31, 1956   Medicare Important Message Given:  Yes     [name] 04/03/2020, 10:42 AM

## 2020-04-03 NOTE — Progress Notes (Signed)
Inpatient Diabetes Program Recommendations  AACE/ADA: New Consensus Statement on Inpatient Glycemic Control   Target Ranges:  Prepandial:   less than 140 mg/dL      Peak postprandial:   less than 180 mg/dL (1-2 hours)      Critically ill patients:  140 - 180 mg/dL   Results for Deanna Rice, Deanna Rice (MRN [id number]) as of 04/03/2020 13:16  Ref. Range 04/02/2020 07:46 04/02/2020 12:25 04/02/2020 17:04 04/02/2020 21:40 04/03/2020 07:54 04/03/2020 11:45  Glucose-Capillary Latest Ref Range: 70 - 99 mg/dL 564 (H) 332 (H) 951 (H) 306 (H) 233 (H) 223 (H)   Review of Glycemic Control  Diabetes history: DM2 Outpatient Diabetes medications: Basaglar 18 units QAM, Basaglar 25 units QHS, Novolog sliding scale, Victoza 1.8 mg daily Current orders for Inpatient glycemic control: Lantus 25 units daily, Novolog 0-20 units TID with meals, Novolog 0-5 units QHS  Inpatient Diabetes Program Recommendations:    Insulin: Noted Lantus increased from 20 to 25 units daily today.  Please consider ordering Novolog 4 units TID with meals for meal coverage if patient eats at least 50% of meals.  Thanks, Orlando Penner, RN, MSN, CDE Diabetes Coordinator Inpatient Diabetes Program [phone] (Team Pager from 8am to 5pm)

## 2020-04-03 NOTE — Progress Notes (Addendum)
Physical Therapy Treatment Patient Details Name: Deanna Rice MRN: [id number] DOB: [birth year] Today's Date: 04/03/2020    History of Present Illness Patient is a 65 y.o. female with medical history significant for morbid obesity, diabetes mellitus, COPD with chronic respiratory failure, venous insufficiency, hypothyroidism, depression and CHF who was brought into the ER for evaluation following a fall with history of multiple falls. Noted to have visual hallucinations while in the ED. Acute metabolic encephalopathy and possible UTI    PT Comments    Pt A&O x4 with no c/o hallucinations for several days. Pt tolerated treatment well today and was able to improve overall assistance levels, standing balance, and activity tolerance. Pt required min assist for trunk facilitation with supine>sit transfer, min guard - supervision for transfers and short distance ambulation with BRW. Pt demo improvements with time, assistance, and safety with 5x STS; pt required 55s for 1st set, and 45s for 2nd set with x1 seated rest break between. Despite improvements within session, pt 5x STS score indicates she is at increased risk of falls. Pt able to maintain SpO2 >90% throughout session with RPE of 4-6/10 indicating "moderate activity". Pt continues to be limited with further progress towards goals due to generalized weakness, decreased activity tolerance, and decreased standing balance. Pt will continue to benefit from skilled acute PT services to address deficits for return to baseline function. Will continue to recommend SNF at DC.  Of note, pt with c/o burning in bil feet post session which she notes is a precursor to blisters. RN notified.    Follow Up Recommendations  SNF     Equipment Recommendations  Other (comment) (TBD at post acute)    Recommendations for Other Services       Precautions / Restrictions Precautions Precautions: Fall Restrictions Other Position/Activity Restrictions: DNR     Mobility  Bed Mobility Overal bed mobility: Needs Assistance Bed Mobility: Supine to Sit     Supine to sit: Min assist     General bed mobility comments: Min A for left hand held assist to pull herself to edge of bed. Multimodal cues for sequencing/hand placement.  Transfers Overall transfer level: Needs assistance Equipment used: Rolling walker (2 wheeled) Transfers: Sit to/from Stand Sit to Stand: Min guard;Supervision         General transfer comment: CGA for safety with cues to stand from EOB x1 and recliner x5 with bariatric RW. Supervision for safety with 2nd set of x5 STS from recliner with bariatric RW.  Ambulation/Gait Ambulation/Gait assistance: Min guard Gait Distance (Feet): 5 Feet Assistive device: Rolling walker (2 wheeled)   Gait velocity: decreased   General Gait Details: Min guard to ambulate 53ft from EOB>recliner with RW; verbal cues for RW proximity. Decreased step length/foot clearance bil, wide BOS due to pt habitus, and labored breathing.     Balance Overall balance assessment: Needs assistance Sitting-balance support: Feet supported Sitting balance-Leahy Scale: Good Sitting balance - Comments: no loss of balance in sitting position   Standing balance support: Bilateral upper extremity supported;During functional activity   Standing balance comment: Fair standing balance with BUE support, requiring supervision from therapist for safety                            Cognition Arousal/Alertness: Awake/alert Behavior During Therapy: WFL for tasks assessed/performed Overall Cognitive Status: Within Functional Limits for tasks assessed  General Comments: patient is alert and oreinted x 4 and following all single step commands without difficulty. no mention of hallucinations      Exercises Other Exercises Other Exercises: Pt able to participate in bed mobility, transfers, and short distance  ambulation with bariatric RW; pt required grossly CGA/supervision for safety with mobility. Multimodal cueing provided during mobility for safety including: hand placement with transfers, RW proximity, positioning with transfers. Other Exercises: Pt able to participate in x10 BLE exercises including: ankle pumps, SAQ, heel slides, and glute sets. Pt also able to perform 2x5 STS from recliner with bariatric RW and x1 seated rest break. Pt demo improved time and assistance with 2nd set of STS.    General Comments General comments (skin integrity, edema, etc.): Purewick tubing disconnected upon PT entry, with urine all over floor by bedside. PT cleaned up floor; RN notified.      Pertinent Vitals/Pain Pain Assessment: No/denies pain           PT Goals (current goals can now be found in the care plan section) Acute Rehab PT Goals Patient Stated Goal: to go home PT Goal Formulation: With patient Time For Goal Achievement: 04/13/20 Potential to Achieve Goals: Fair Progress towards PT goals: Progressing toward goals    Frequency    Min 2X/week      PT Plan Current plan remains appropriate       AM-PAC PT "6 Clicks" Mobility   Outcome Measure  Help needed turning from your back to your side while in a flat bed without using bedrails?: A Little Help needed moving from lying on your back to sitting on the side of a flat bed without using bedrails?: A Little Help needed moving to and from a bed to a chair (including a wheelchair)?: A Little Help needed standing up from a chair using your arms (e.g., wheelchair or bedside chair)?: A Little Help needed to walk in hospital room?: A Lot Help needed climbing 3-5 steps with a railing? : Total 6 Click Score: 15    End of Session Equipment Utilized During Treatment: Gait belt;Oxygen (4LPM) Activity Tolerance: Patient limited by fatigue;No increased pain;Patient tolerated treatment well Patient left: with call bell/phone within reach;in  chair;with chair alarm set Nurse Communication: Mobility status (purewick, vitals, pt c/o burning in feet) PT Visit Diagnosis: Repeated falls (R29.6);Muscle weakness (generalized) (M62.81);Difficulty in walking, not elsewhere classified (R26.2)     Time: 7510-2585 PT Time Calculation (min) (ACUTE ONLY): 34 min  Charges:  $Therapeutic Exercise: 8-22 mins $Therapeutic Activity: 8-22 mins                    Vira Blanco, PT, DPT 11:02 AM,04/03/20

## 2020-04-03 NOTE — Progress Notes (Signed)
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  Work-up in the emergency room was relatively unrevealing except for abnormal urinalysis, ABG ruled out hypercarbia, no leukocytosis, CT head without acute findings, chest x-ray noted mild pulmonary venous congestion and basilar atelectasis she also had an MRI of her left knee which showed severe degenerative changes.  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  Assessment & Plan:   Visual hallucinations Encephalopathy -Had visual hallucinations on 2/2 and 2/3, on 2/4 had significantly increased agitation requiring Haldol x2 -Etiology is unclear, urinalysis not very impressive however recently grew pansensitive E. coli from urine culture last week -Completed 5 days of IV ceftriaxone -Clinically improved and back to baseline, no further evidence of hallucinations at this time -History not suggestive of any medication withdrawal, ammonia level within normal limits -TSH is high but free T4 is normal, will need thyroid function test repeated in 4 to 6 weeks -PT OT eval completed, SNF recommended for rehab -Discharge planning, I think she needs long-term SNF, social work consulted and following  Possible UTI -See above, completed 5 days of ceftriaxone -Unfortunately urine  culture did not get collected on admission  Morbid obesity -BMI 60.2 -Most significant comorbidity, contributing to ongoing functional decline  Frequent falls -Suspect this is secondary to morbid obesity, overall gait disorder and chronic osteoarthritis especially in her left knee -PT OT eval -She may need SNF  COPD Chronic respiratory failure on 4 L home O2 -Stable, no wheezing continue nebs, ICS/LABA  History of CAD -Stable, continue aspirin, Plavix, metoprolol and nitrates -Decrease nitrate dose to avoid orthostatic hypotension  CKD 3a -Baseline creatinine around 1.6, now 1.7 -Stable, monitor  Type 2 diabetes mellitus -On 50 units of Lantus and NovoLog sliding scale at baseline -CBGs uncontrolled, increase Lantus to 30 units  DVT prophylaxis: Lovenox Code Status: DNR per patients wishes and sister Elease Hashimoto Family Communication: No family at bedside, updated Sister Elease Hashimoto over the weekend Disposition Plan:  Status is: Inpatient  Remains inpatient appropriate because:Inpatient level of care appropriate due to severity of illness   Dispo: The patient is from: ALF              Anticipated d/c is to: SNF              Anticipated d/c date is: When SNF bed is available              Patient currently is medically stable to d/c.   Difficult to place patient No  Consultants:      Procedures:   No outven`t. Antimicrobials:    Subjective: -No events overnight  Objective: Vitals:   04/02/20 2352 04/03/20 0512 04/03/20 0815 04/03/20 1007  BP: (!) 173/73 (!) 156/75 (!) 155/80   Pulse: 81 82 84 90  Resp: 15 16 20    Temp: 98.3 F (36.8 C) 98 F (36.7 C) (!)  97.4 F (36.3 C)   TempSrc:   Oral   SpO2: 100% 100% 100% 95%  Weight:      Height:        Intake/Output Summary (Last 24 hours) at 04/03/2020 1245 Last data filed at 04/03/2020 3329 Gross per 24 hour  Intake 480 ml  Output 2700 ml  Net -2220 ml   Filed Weights   03/29/20 0626 03/30/20 0028  Weight:  (!) 179.2 kg (!) 174.4 kg    Examination:  General exam: Morbidly obese chronically ill female lying in bed, awake and alert, oriented x3, no distress CVS: S1-S2, distant heart sounds Lungs: Distant breath sounds otherwise clear Abdomen: Obese, soft, massive abdominal pannus, nontender, bowel sounds present Extremities: No edema, multiple scabs in both lower legs and feet  Neuro: Moves all extremities, no localizing signs  Psychiatry: Mood & affect appropriate.     Data Reviewed:   CBC: Recent Labs  Lab 03/28/20 1019 03/29/20 0636 03/30/20 0520 04/01/20 0839  WBC 5.0 7.4 6.0 5.0  NEUTROABS  --  6.0  --   --   HGB 8.2* 9.2* 8.3* 8.2*  HCT 26.6* 29.5* 26.5* 26.7*  MCV 91.4 90.2 90.4 91.4  PLT 134* 139* 135* 128*   Basic Metabolic Panel: Recent Labs  Lab 03/28/20 1019 03/29/20 0636 03/30/20 0520 03/31/20 0540 04/01/20 0839  NA 141 142 138 138 141  K 3.8 4.2 4.2 4.1 4.1  CL 105 104 102 104 104  CO2 25 26 27 25 25   GLUCOSE 62* 126* 251* 109* 129*  BUN 29* 28* 29* 33* 29*  CREATININE 1.62* 1.65* 1.78* 1.64* 1.54*  CALCIUM 7.8* 8.1* 7.7* 8.0* 8.3*   GFR: Estimated Creatinine Clearance: 61.3 mL/min (A) (by C-G formula based on SCr of 1.54 mg/dL (H)). Liver Function Tests: Recent Labs  Lab 04/01/20 0839  AST 18  ALT 17  ALKPHOS 81  BILITOT 0.7  PROT 5.9*  ALBUMIN 2.9*   No results for input(s): LIPASE, AMYLASE in the last 168 hours. Recent Labs  Lab 03/30/20 1222  AMMONIA 16   Coagulation Profile: No results for input(s): INR, PROTIME in the last 168 hours. Cardiac Enzymes: No results for input(s): CKTOTAL, CKMB, CKMBINDEX, TROPONINI in the last 168 hours. BNP (last 3 results) No results for input(s): PROBNP in the last 8760 hours. HbA1C: No results for input(s): HGBA1C in the last 72 hours. CBG: Recent Labs  Lab 04/02/20 1225 04/02/20 1704 04/02/20 2140 04/03/20 0754 04/03/20 1145  GLUCAP 213* 219* 306* 233* 223*   Lipid Profile: No  results for input(s): CHOL, HDL, LDLCALC, TRIG, CHOLHDL, LDLDIRECT in the last 72 hours. Thyroid Function Tests: No results for input(s): TSH, T4TOTAL, FREET4, T3FREE, THYROIDAB in the last 72 hours. Anemia Panel: No results for input(s): VITAMINB12, FOLATE, FERRITIN, TIBC, IRON, RETICCTPCT in the last 72 hours. Urine analysis:    Component Value Date/Time   COLORURINE YELLOW (A) 03/29/2020 0948   APPEARANCEUR CLEAR (A) 03/29/2020 0948   LABSPEC 1.010 03/29/2020 0948   PHURINE 5.0 03/29/2020 0948   GLUCOSEU NEGATIVE 03/29/2020 0948   HGBUR SMALL (A) 03/29/2020 0948   BILIRUBINUR NEGATIVE 03/29/2020 0948   KETONESUR NEGATIVE 03/29/2020 0948   PROTEINUR >=300 (A) 03/29/2020 0948   NITRITE NEGATIVE 03/29/2020 0948   LEUKOCYTESUR NEGATIVE 03/29/2020 0948   Sepsis Labs: @LABRCNTIP (procalcitonin:4,lacticidven:4)  ) Recent Results (from the past 240 hour(s))  SARS CORONAVIRUS 2 (TAT 6-24 HRS) Nasopharyngeal Nasopharyngeal Swab     Status: None   Collection Time: 03/29/20  3:35 PM   Specimen: Nasopharyngeal Swab  Result Value Ref Range Status   SARS Coronavirus 2 NEGATIVE NEGATIVE Final    Comment: (NOTE) SARS-CoV-2 target nucleic acids are NOT DETECTED.  The SARS-CoV-2 RNA is generally detectable in upper and lower respiratory specimens during the acute phase of infection. Negative results do not preclude SARS-CoV-2 infection, do not rule out co-infections with other pathogens, and should not be used as the sole basis for treatment or other patient management decisions. Negative results must be combined with clinical observations, patient history, and epidemiological information. The expected result is Negative.  Fact Sheet for Patients: HairSlick.no  Fact Sheet for Healthcare Providers: quierodirigir.com  This test is not yet approved or cleared by the [place] FDA and  has been authorized for detection and/or  diagnosis of SARS-CoV-2 by FDA under an Emergency Use Authorization (EUA). This EUA will remain  in effect (meaning this test can be used) for the duration of the COVID-19 declaration under Se ction 564(b)(1) of the Act, 21 U.S.C. section 360bbb-3(b)(1), unless the authorization is terminated or revoked sooner.  Performed at [facility], [address] [place], [place] [address]   MRSA PCR Screening     Status: None   Collection Time: 03/30/20  2:05 AM   Specimen: Nasal Mucosa; Nasopharyngeal  Result Value Ref Range Status   MRSA by PCR NEGATIVE NEGATIVE Final    Comment:        The GeneXpert MRSA Assay (FDA approved for NASAL specimens only), is one component of a comprehensive MRSA colonization surveillance program. It is not intended to diagnose MRSA infection nor to guide or monitor treatment for MRSA infections. Performed at [facility], [address] [place], [place] [address]     Scheduled Meds: . allopurinol  100 mg Oral Daily  . aspirin EC  81 mg Oral Daily  . atorvastatin  40 mg Oral Daily  . diclofenac sodium  2 g Topical TID  . DULoxetine  60 mg Oral BID  . enoxaparin (LOVENOX) injection  0.5 mg/kg Subcutaneous Q24H  . fluticasone  2 spray Each Nare Daily  . insulin aspart  0-20 Units Subcutaneous TID WC  . insulin aspart  0-5 Units Subcutaneous QHS  . insulin glargine  25 Units Subcutaneous Daily  . isosorbide mononitrate  15 mg Oral Daily  . loratadine  10 mg Oral Daily  . melatonin  10 mg Oral QHS  . metoprolol succinate  25 mg Oral Daily  . mometasone-formoterol  2 puff Inhalation BID  . multivitamin with minerals  1 tablet Oral Daily  . nystatin   Topical BID  . sodium chloride flush  3 mL Intravenous Q12H   Continuous Infusions: . sodium chloride 250 mL (03/30/20 1304)     LOS: 5 days   Time spent:  Zannie Cove, MD [facility] 04/03/2020, 12:45 PM

## 2020-04-03 NOTE — TOC Progression Note (Signed)
Transition of Care [facility]) - Progression Note    Patient Details  Name: Deanna Rice MRN: [id number] Date of Birth: 21-Apr-1955  Transition of Care [facility]) CM/SW Contact  Chapman Fitch, RN Phone Number: 04/03/2020, 12:50 PM  Clinical Narrative:     Confirmed with Christian at [facility] house that patient can not return until after for goes to SNF for STR.  No need offers, bed search extended   Expected Discharge Plan: Skilled Nursing Facility Barriers to Discharge: Continued Medical Work up  Expected Discharge Plan and Services Expected Discharge Plan: Skilled Nursing Facility In-house Referral: Clinical Social Work   Post Acute Care Choice: Skilled Nursing Facility Living arrangements for the past 2 months: Assisted Living Facility [employer])                                       Social Determinants of Health (SDOH) Interventions    Readmission Risk Interventions Readmission Risk Prevention Plan 03/31/2020  Transportation Screening Complete  Palliative Care Screening Not Applicable  Medication Review (RN Care Manager) Complete  Some recent data might be hidden

## 2020-04-04 DIAGNOSIS — G9341 Metabolic encephalopathy: Secondary | ICD-10-CM | POA: Diagnosis not present

## 2020-04-04 DIAGNOSIS — M25562 Pain in left knee: Secondary | ICD-10-CM | POA: Diagnosis not present

## 2020-04-04 DIAGNOSIS — N390 Urinary tract infection, site not specified: Secondary | ICD-10-CM | POA: Diagnosis not present

## 2020-04-04 LAB — GLUCOSE, CAPILLARY
Glucose-Capillary: 273 mg/dL — ABNORMAL HIGH (ref 70–99)
Glucose-Capillary: 232 mg/dL — ABNORMAL HIGH (ref 70–99)
Glucose-Capillary: 238 mg/dL — ABNORMAL HIGH (ref 70–99)
Glucose-Capillary: 281 mg/dL — ABNORMAL HIGH (ref 70–99)

## 2020-04-04 MED ORDER — INSULIN GLARGINE 100 UNIT/ML ~~LOC~~ SOLN
35.0000 [IU] | Freq: Every day | SUBCUTANEOUS | Status: DC
  Administered 2020-04-04 – 2020-04-11 (×8): 35 [IU] via SUBCUTANEOUS
  Filled 2020-04-04 (×8): qty 0.35

## 2020-04-04 MED ORDER — TRAMADOL HCL 50 MG PO TABS: 50.0000 mg | ORAL_TABLET | Freq: Four times a day (QID) | ORAL | Status: DC | PRN

## 2020-04-04 MED ORDER — INSULIN ASPART 100 UNIT/ML ~~LOC~~ SOLN
3.0000 [IU] | Freq: Three times a day (TID) | SUBCUTANEOUS | Status: DC
  Administered 2020-04-04 – 2020-04-13 (×27): 3 [IU] via SUBCUTANEOUS
  Filled 2020-04-04 (×26): qty 1

## 2020-04-04 MED ORDER — ACETAMINOPHEN 325 MG PO TABS
650.0000 mg | ORAL_TABLET | Freq: Four times a day (QID) | ORAL | Status: DC | PRN
  Administered 2020-04-04 – 2020-04-08 (×2): 650 mg via ORAL
  Filled 2020-04-04 (×3): qty 2

## 2020-04-04 MED ORDER — INSULIN GLARGINE 100 UNIT/ML ~~LOC~~ SOLN: 40.0000 [IU] | Freq: Every day | SUBCUTANEOUS | Status: DC

## 2020-04-04 NOTE — Progress Notes (Signed)
PROGRESS NOTE    Deanna Rice  MWN:[id number] DOB: 1955-12-07 DOA: 03/29/2020 PCP: Lyndon Code, MD  Brief Narrative: Chronically ill morbidly obese female BMI of 60, type 2 diabetes mellitus, COPD/chronic respiratory failure on 4 L home O2, chronic venous insufficiency, hypothyroidism, chronic diastolic CHF, chronic osteoarthritis, depression was brought to the ER after he had another fall, she resides at a local assisted living facility and usually ambulates only very short distances with an assist device, otherwise uses a wheelchair has been seen in the ER multiple times for recurrent falls, while in the emergency room she was noted to have visual hallucinations.  Work-up in the emergency room was relatively unrevealing except for abnormal urinalysis, ABG ruled out hypercarbia, no leukocytosis, CT head without acute findings, chest x-ray noted mild pulmonary venous congestion and basilar atelectasis she also had an MRI of her left knee which showed severe degenerative changes.  Also had MRI of the brain which did not have acute findings  Assessment & Plan:   Visual hallucinations Encephalopathy -Had visual hallucinations on 2/2 and 2/3, on 2/4 had significantly increased agitation requiring Haldol x2, significantly improved since then -Etiology is unclear, urinalysis not very impressive however recently grew pansensitive E. coli from urine culture last week -Completed 5 days of IV ceftriaxone -Clinically improved and back to baseline, no further evidence of hallucinations at this time -History not suggestive of any medication withdrawal, ammonia level within normal limits -TSH is high but free T4 is normal, will need thyroid function test repeated in 4 to 6 weeks -PT OT eval completed, SNF recommended for rehab -Discharge planning, medically stable, awaiting SNF, ToC following  Possible UTI -See above, completed 5 days of ceftriaxone -Unfortunately urine culture did not get collected on  admission  Morbid obesity -BMI 60.2 -Most significant comorbidity, contributing to ongoing functional decline  Frequent falls -Suspect this is secondary to morbid obesity, overall gait disorder and chronic osteoarthritis especially in her left knee -PT OT eval -needs SNF  COPD Chronic respiratory failure on 4 L home O2 -Stable, no wheezing continue nebs, ICS/LABA  History of CAD -Stable, continue aspirin, Plavix, metoprolol and nitrates -Decreased nitrate dose to avoid orthostatic hypotension  CKD 3a -Baseline creatinine around 1.6 -Stable, monitor  Type 2 diabetes mellitus -On 50 units of Lantus and NovoLog sliding scale at baseline -CBGs uncontrolled, increased Lantus and novolog with meals  DVT prophylaxis: Lovenox Code Status: DNR per patients wishes and sister Elease Hashimoto Family Communication: No family at bedside, updated Sister Elease Hashimoto over the weekend Disposition Plan:  Status is: Inpatient  Remains inpatient appropriate because:Inpatient level of care appropriate due to severity of illness   Dispo: The patient is from: ALF              Anticipated d/c is to: SNF              Anticipated d/c date is: When SNF bed is available              Patient currently is medically stable to d/c.   Difficult to place patient No  Consultants:      Procedures:   No outven`t. Antimicrobials:    Subjective: -feels ok, no events overnight  Objective: Vitals:   04/03/20 2324 04/04/20 0507 04/04/20 0808 04/04/20 1154  BP: (!) 167/78 (!) 155/76 (!) 160/78 (!) 147/71  Pulse: 87 79 87 75  Resp: 18 18 16 20   Temp: 97.8 F (36.6 C) 98 F (36.7 C) 97.8 F (36.6 C)  98.4 F (36.9 C)  TempSrc:  Oral Oral Oral  SpO2: 100% 100% 100% 100%  Weight:      Height:        Intake/Output Summary (Last 24 hours) at 04/04/2020 1350 Last data filed at 04/04/2020 0443 Gross per 24 hour  Intake 0 ml  Output 2025 ml  Net -2025 ml   Filed Weights   03/29/20 0626 03/30/20 0028   Weight: (!) 179.2 kg (!) 174.4 kg    Examination:  General exam: Morbidly obese chronically ill female, laying in bed awake alert, oriented x3, no distress, on 4 L O2 via nasal cannula CVS: S1-S2, regular rate rhythm, distant heart sounds Lungs: Distant breath sounds otherwise clear Abdomen: Soft, obese, massive abdominal pannus, nontender, bowel sounds present Extremities: No edema, multiple scabs in both lower legs and feet  Neuro: Moves all extremities, no localizing signs  Psychiatry: Mood & affect appropriate.     Data Reviewed:   CBC: Recent Labs  Lab 03/29/20 0636 03/30/20 0520 04/01/20 0839  WBC 7.4 6.0 5.0  NEUTROABS 6.0  --   --   HGB 9.2* 8.3* 8.2*  HCT 29.5* 26.5* 26.7*  MCV 90.2 90.4 91.4  PLT 139* 135* 128*   Basic Metabolic Panel: Recent Labs  Lab 03/29/20 0636 03/30/20 0520 03/31/20 0540 04/01/20 0839  NA 142 138 138 141  K 4.2 4.2 4.1 4.1  CL 104 102 104 104  CO2 26 27 25 25   GLUCOSE 126* 251* 109* 129*  BUN 28* 29* 33* 29*  CREATININE 1.65* 1.78* 1.64* 1.54*  CALCIUM 8.1* 7.7* 8.0* 8.3*   GFR: Estimated Creatinine Clearance: 61.3 mL/min (A) (by C-G formula based on SCr of 1.54 mg/dL (H)). Liver Function Tests: Recent Labs  Lab 04/01/20 0839  AST 18  ALT 17  ALKPHOS 81  BILITOT 0.7  PROT 5.9*  ALBUMIN 2.9*   No results for input(s): LIPASE, AMYLASE in the last 168 hours. Recent Labs  Lab 03/30/20 1222  AMMONIA 16   Coagulation Profile: No results for input(s): INR, PROTIME in the last 168 hours. Cardiac Enzymes: No results for input(s): CKTOTAL, CKMB, CKMBINDEX, TROPONINI in the last 168 hours. BNP (last 3 results) No results for input(s): PROBNP in the last 8760 hours. HbA1C: No results for input(s): HGBA1C in the last 72 hours. CBG: Recent Labs  Lab 04/03/20 1145 04/03/20 1638 04/03/20 2024 04/04/20 0747 04/04/20 1120  GLUCAP 223* 179* 255* 273* 232*   Lipid Profile: No results for input(s): CHOL, HDL, LDLCALC,  TRIG, CHOLHDL, LDLDIRECT in the last 72 hours. Thyroid Function Tests: No results for input(s): TSH, T4TOTAL, FREET4, T3FREE, THYROIDAB in the last 72 hours. Anemia Panel: No results for input(s): VITAMINB12, FOLATE, FERRITIN, TIBC, IRON, RETICCTPCT in the last 72 hours. Urine analysis:    Component Value Date/Time   COLORURINE YELLOW (A) 03/29/2020 0948   APPEARANCEUR CLEAR (A) 03/29/2020 0948   LABSPEC 1.010 03/29/2020 0948   PHURINE 5.0 03/29/2020 0948   GLUCOSEU NEGATIVE 03/29/2020 0948   HGBUR SMALL (A) 03/29/2020 0948   BILIRUBINUR NEGATIVE 03/29/2020 0948   KETONESUR NEGATIVE 03/29/2020 0948   PROTEINUR >=300 (A) 03/29/2020 0948   NITRITE NEGATIVE 03/29/2020 0948   LEUKOCYTESUR NEGATIVE 03/29/2020 0948   Sepsis Labs: @LABRCNTIP (procalcitonin:4,lacticidven:4)  ) Recent Results (from the past 240 hour(s))  SARS CORONAVIRUS 2 (TAT 6-24 HRS) Nasopharyngeal Nasopharyngeal Swab     Status: None   Collection Time: 03/29/20  3:35 PM   Specimen: Nasopharyngeal Swab  Result Value Ref Range  Status   SARS Coronavirus 2 NEGATIVE NEGATIVE Final    Comment: (NOTE) SARS-CoV-2 target nucleic acids are NOT DETECTED.  The SARS-CoV-2 RNA is generally detectable in upper and lower respiratory specimens during the acute phase of infection. Negative results do not preclude SARS-CoV-2 infection, do not rule out co-infections with other pathogens, and should not be used as the sole basis for treatment or other patient management decisions. Negative results must be combined with clinical observations, patient history, and epidemiological information. The expected result is Negative.  Fact Sheet for Patients: HairSlick.no  Fact Sheet for Healthcare Providers: quierodirigir.com  This test is not yet approved or cleared by the [place] FDA and  has been authorized for detection and/or diagnosis of SARS-CoV-2 by FDA under an  Emergency Use Authorization (EUA). This EUA will remain  in effect (meaning this test can be used) for the duration of the COVID-19 declaration under Se ction 564(b)(1) of the Act, 21 U.S.C. section 360bbb-3(b)(1), unless the authorization is terminated or revoked sooner.  Performed at [facility], [address], [place], [place] [address]   MRSA PCR Screening     Status: None   Collection Time: 03/30/20  2:05 AM   Specimen: Nasal Mucosa; Nasopharyngeal  Result Value Ref Range Status   MRSA by PCR NEGATIVE NEGATIVE Final    Comment:        The GeneXpert MRSA Assay (FDA approved for NASAL specimens only), is one component of a comprehensive MRSA colonization surveillance program. It is not intended to diagnose MRSA infection nor to guide or monitor treatment for MRSA infections. Performed at [facility], [address] [place], [place] [address]     Scheduled Meds: . allopurinol  100 mg Oral Daily  . aspirin EC  81 mg Oral Daily  . atorvastatin  40 mg Oral Daily  . diclofenac sodium  2 g Topical TID  . DULoxetine  60 mg Oral BID  . enoxaparin (LOVENOX) injection  0.5 mg/kg Subcutaneous Q24H  . fluticasone  2 spray Each Nare Daily  . insulin aspart  0-20 Units Subcutaneous TID WC  . insulin aspart  0-5 Units Subcutaneous QHS  . insulin aspart  3 Units Subcutaneous TID WC  . insulin glargine  35 Units Subcutaneous Daily  . isosorbide mononitrate  15 mg Oral Daily  . loratadine  10 mg Oral Daily  . melatonin  10 mg Oral QHS  . metoprolol succinate  25 mg Oral Daily  . mometasone-formoterol  2 puff Inhalation BID  . multivitamin with minerals  1 tablet Oral Daily  . nystatin   Topical BID  . sodium chloride flush  3 mL Intravenous Q12H   Continuous Infusions: . sodium chloride 250 mL (03/30/20 1304)     LOS: 6 days   Time spent:  Zannie Cove, MD [facility] 04/04/2020, 1:50 PM

## 2020-04-05 ENCOUNTER — Encounter: Payer: Self-pay | Admitting: Internal Medicine

## 2020-04-05 DIAGNOSIS — N390 Urinary tract infection, site not specified: Secondary | ICD-10-CM | POA: Diagnosis not present

## 2020-04-05 DIAGNOSIS — M25562 Pain in left knee: Secondary | ICD-10-CM | POA: Diagnosis not present

## 2020-04-05 DIAGNOSIS — G9341 Metabolic encephalopathy: Secondary | ICD-10-CM | POA: Diagnosis not present

## 2020-04-05 LAB — GLUCOSE, CAPILLARY
Glucose-Capillary: 162 mg/dL — ABNORMAL HIGH (ref 70–99)
Glucose-Capillary: 148 mg/dL — ABNORMAL HIGH (ref 70–99)
Glucose-Capillary: 141 mg/dL — ABNORMAL HIGH (ref 70–99)
Glucose-Capillary: 197 mg/dL — ABNORMAL HIGH (ref 70–99)

## 2020-04-05 LAB — BASIC METABOLIC PANEL
Anion gap: 8 (ref 5–15)
BUN: 27 mg/dL — ABNORMAL HIGH (ref 8–23)
CO2: 31 mmol/L (ref 22–32)
Calcium: 8.3 mg/dL — ABNORMAL LOW (ref 8.9–10.3)
Chloride: 101 mmol/L (ref 98–111)
Creatinine, Ser: 1.4 mg/dL — ABNORMAL HIGH (ref 0.44–1.00)
GFR, Estimated: 42 mL/min — ABNORMAL LOW (ref >60)
Glucose, Bld: 209 mg/dL — ABNORMAL HIGH (ref 70–99)
Potassium: 4.5 mmol/L (ref 3.5–5.1)
Sodium: 140 mmol/L (ref 135–145)

## 2020-04-05 NOTE — Progress Notes (Signed)
PROGRESS NOTE    Deanna Rice  PJS:[id number] DOB: 12-17-1955 DOA: 03/29/2020 PCP: Lyndon Code, MD  Brief Narrative:  Chronically ill morbidly obese female BMI of 60, type 2 diabetes mellitus, COPD/chronic respiratory failure on 4 L home O2, chronic venous insufficiency, hypothyroidism, chronic diastolic CHF, chronic osteoarthritis, depression was brought to the ER after he had another fall, she resides at a local assisted living facility and usually ambulates only very short distances with an assist device, otherwise uses a wheelchair has been seen in the ER multiple times for recurrent falls, while in the emergency room she was noted to have visual hallucinations.  Work-up in the emergency room was relatively unrevealing except for abnormal urinalysis, ABG ruled out hypercarbia, no leukocytosis, CT head without acute findings, chest x-ray noted mild pulmonary venous congestion and basilar atelectasis she also had an MRI of her left knee which showed severe degenerative changes.  Also had MRI of the brain which did not have acute findings  Patient has been relatively stable and mental status has returned to baseline.  Patient medically stable for discharge at this time however cannot return to her local assisted living facility due to frequent falls and need for higher level of care.  Bed search initiated.  1 facility in [place] is unable to accept due to patient's lymphedema.   Assessment & Plan:   Principal Problem:   Acute metabolic encephalopathy Active Problems:   COPD (chronic obstructive pulmonary disease) (HCC)   HTN (hypertension)   Primary hypothyroidism   Type 2 diabetes mellitus with stage 3 chronic kidney disease (HCC)   Acute lower UTI  Visual hallucinations Acute metabolic encephalopathy -Had visual hallucinations on 2/2 and 2/3, on 2/4 had significantly increased agitation requiring Haldol x2, significantly improved since then -Etiology is unclear, urinalysis not very  impressive however recently grew pansensitive E. coli from urine culture last week -Completed 5 days of IV ceftriaxone -Clinically improved and back to baseline, no further evidence of hallucinations at this time -History not suggestive of any medication withdrawal, ammonia level within normal limits -TSH is high but free T4 is normal, will need thyroid function test repeated in 4 to 6 weeks -PT OT eval completed, SNF recommended for rehab -Discharge planning, medically stable, awaiting SNF, ToC following -Discharge delayed by lack of accepting facility  Possible UTI -See above, completed 5 days of ceftriaxone -Unfortunately urine culture did not get collected on admission  Morbid obesity -BMI 60.2 -Most significant comorbidity, contributing to ongoing functional decline  Frequent falls -Suspect this is secondary to morbid obesity, overall gait disorder and chronic osteoarthritis especially in her left knee -PT OT eval -needs SNF  COPD Chronic respiratory failure on 4 L home O2 -Stable, no wheezing continue nebs, ICS/LABA  History of CAD -Stable, continue aspirin, Plavix, metoprolol and nitrates -Decreased nitrate dose to avoid orthostatic hypotension  CKD 3a -Baseline creatinine around 1.6 -Stable, monitor  Type 2 diabetes mellitus -On 50 units of Lantus and NovoLog sliding scale at baseline -CBGs have been elevated -Basal bolus regimen escalated -Better control.  We will continue to monitor   DVT prophylaxis: Lovenox  Code Status: DNR Family Communication: None today Disposition Plan: Status is: Inpatient  Remains inpatient appropriate because:Unsafe d/c plan   Dispo: The patient is from: ALF              Anticipated d/c is to: SNF              Anticipated d/c date is: 1  day              Patient currently is medically stable to d/c.   Difficult to place patient Yes  As of this date we are unable to find an accepting facility.  TOC aware and is actively  looking for options     Level of care: Med-Surg  Consultants:   None  Procedures:   None  Antimicrobials:      Subjective: Patient seen and examined.  Mental status at baseline.  Denies hallucinations.  No pain complaints.  Objective: Vitals:   04/04/20 1154 04/04/20 2111 04/04/20 2323 04/05/20 1135  BP: (!) 147/71 (!) 147/69 (!) 160/79 (!) 129/48  Pulse: 75 86 83 73  Resp: 20 20 17 16   Temp: 98.4 F (36.9 C) 98.9 F (37.2 C) 98.6 F (37 C) 97.9 F (36.6 C)  TempSrc: Oral  Oral Oral  SpO2: 100% 100% 100% 100%  Weight:      Height:        Intake/Output Summary (Last 24 hours) at 04/05/2020 1347 Last data filed at 04/05/2020 1132 Gross per 24 hour  Intake 0 ml  Output 4000 ml  Net -4000 ml   Filed Weights   03/29/20 0626 03/30/20 0028  Weight: (!) 179.2 kg (!) 174.4 kg    Examination:  General exam: No acute distress.  Appears chronically ill Respiratory system: Lung sounds decreased at bases.  Normal work of breathing.  Room air  cardiovascular system: Distant heart sounds, regular rate and rhythm, no murmurs Gastrointestinal system: Obese, nontender, nondistended, normal bowel sounds Central nervous system: Alert and oriented. No focal neurological deficits. Extremities: Symmetric 5 x 5 power. Skin: Scattered excoriations Psychiatry: Judgement and insight appear normal. Mood & affect appropriate.     Data Reviewed: I have personally reviewed following labs and imaging studies  CBC: Recent Labs  Lab 03/30/20 0520 04/01/20 0839  WBC 6.0 5.0  HGB 8.3* 8.2*  HCT 26.5* 26.7*  MCV 90.4 91.4  PLT 135* 128*   Basic Metabolic Panel: Recent Labs  Lab 03/30/20 0520 03/31/20 0540 04/01/20 0839 04/05/20 0422  NA 138 138 141 140  K 4.2 4.1 4.1 4.5  CL 102 104 104 101  CO2 27 25 25 31   GLUCOSE 251* 109* 129* 209*  BUN 29* 33* 29* 27*  CREATININE 1.78* 1.64* 1.54* 1.40*  CALCIUM 7.7* 8.0* 8.3* 8.3*   GFR: Estimated Creatinine Clearance: 67.5  mL/min (A) (by C-G formula based on SCr of 1.4 mg/dL (H)). Liver Function Tests: Recent Labs  Lab 04/01/20 0839  AST 18  ALT 17  ALKPHOS 81  BILITOT 0.7  PROT 5.9*  ALBUMIN 2.9*   No results for input(s): LIPASE, AMYLASE in the last 168 hours. Recent Labs  Lab 03/30/20 1222  AMMONIA 16   Coagulation Profile: No results for input(s): INR, PROTIME in the last 168 hours. Cardiac Enzymes: No results for input(s): CKTOTAL, CKMB, CKMBINDEX, TROPONINI in the last 168 hours. BNP (last 3 results) No results for input(s): PROBNP in the last 8760 hours. HbA1C: No results for input(s): HGBA1C in the last 72 hours. CBG: Recent Labs  Lab 04/04/20 1120 04/04/20 1715 04/04/20 2155 04/05/20 0749 04/05/20 1155  GLUCAP 232* 238* 281* 162* 148*   Lipid Profile: No results for input(s): CHOL, HDL, LDLCALC, TRIG, CHOLHDL, LDLDIRECT in the last 72 hours. Thyroid Function Tests: No results for input(s): TSH, T4TOTAL, FREET4, T3FREE, THYROIDAB in the last 72 hours. Anemia Panel: No results for input(s): VITAMINB12, FOLATE, FERRITIN,  TIBC, IRON, RETICCTPCT in the last 72 hours. Sepsis Labs: No results for input(s): PROCALCITON, LATICACIDVEN in the last 168 hours.  Recent Results (from the past 240 hour(s))  SARS CORONAVIRUS 2 (TAT 6-24 HRS) Nasopharyngeal Nasopharyngeal Swab     Status: None   Collection Time: 03/29/20  3:35 PM   Specimen: Nasopharyngeal Swab  Result Value Ref Range Status   SARS Coronavirus 2 NEGATIVE NEGATIVE Final    Comment: (NOTE) SARS-CoV-2 target nucleic acids are NOT DETECTED.  The SARS-CoV-2 RNA is generally detectable in upper and lower respiratory specimens during the acute phase of infection. Negative results do not preclude SARS-CoV-2 infection, do not rule out co-infections with other pathogens, and should not be used as the sole basis for treatment or other patient management decisions. Negative results must be combined with clinical  observations, patient history, and epidemiological information. The expected result is Negative.  Fact Sheet for Patients: HairSlick.no  Fact Sheet for Healthcare Providers: quierodirigir.com  This test is not yet approved or cleared by the [place] FDA and  has been authorized for detection and/or diagnosis of SARS-CoV-2 by FDA under an Emergency Use Authorization (EUA). This EUA will remain  in effect (meaning this test can be used) for the duration of the COVID-19 declaration under Se ction 564(b)(1) of the Act, 21 U.S.C. section 360bbb-3(b)(1), unless the authorization is terminated or revoked sooner.  Performed at [facility], [address] [place], [place] [address]   MRSA PCR Screening     Status: None   Collection Time: 03/30/20  2:05 AM   Specimen: Nasal Mucosa; Nasopharyngeal  Result Value Ref Range Status   MRSA by PCR NEGATIVE NEGATIVE Final    Comment:        The GeneXpert MRSA Assay (FDA approved for NASAL specimens only), is one component of a comprehensive MRSA colonization surveillance program. It is not intended to diagnose MRSA infection nor to guide or monitor treatment for MRSA infections. Performed at [facility], [address] [place], [place] [address]          Radiology Studies: No results found.      Scheduled Meds: . allopurinol  100 mg Oral Daily  . aspirin EC  81 mg Oral Daily  . atorvastatin  40 mg Oral Daily  . diclofenac sodium  2 g Topical TID  . DULoxetine  60 mg Oral BID  . enoxaparin (LOVENOX) injection  0.5 mg/kg Subcutaneous Q24H  . fluticasone  2 spray Each Nare Daily  . insulin aspart  0-20 Units Subcutaneous TID WC  . insulin aspart  0-5 Units Subcutaneous QHS  . insulin aspart  3 Units Subcutaneous TID WC  . insulin glargine  35 Units Subcutaneous Daily  . isosorbide mononitrate  15 mg Oral Daily  . loratadine  10 mg Oral Daily  .  melatonin  10 mg Oral QHS  . metoprolol succinate  25 mg Oral Daily  . mometasone-formoterol  2 puff Inhalation BID  . multivitamin with minerals  1 tablet Oral Daily  . nystatin   Topical BID  . sodium chloride flush  3 mL Intravenous Q12H   Continuous Infusions: . sodium chloride 250 mL (03/30/20 1304)     LOS: 7 days    Time spent: 15 minutes    Tresa Moore, MD [facility] Pager [phone]  If 7PM-7AM, please contact night-coverage 04/05/2020, 1:47 PM

## 2020-04-05 NOTE — Progress Notes (Addendum)
Physical Therapy Treatment Patient Details Name: Deanna Rice MRN: [id number] DOB: 08-30-1955 Today's Date: 04/05/2020    History of Present Illness Patient is a 65 y.o. female with medical history significant for morbid obesity, diabetes mellitus, COPD with chronic respiratory failure, venous insufficiency, hypothyroidism, depression and CHF who was brought into the ER for evaluation following a fall with history of multiple falls. Noted to have visual hallucinations while in the ED. Acute metabolic encephalopathy and possible UTI    PT Comments    Pt was pleasant and motivated to participate during the session and put forth very good effort throughout.  Of note pt found on 4LO2/min which per patient is baseline but with SpO2 at 99%.  Pt's SpO2 remained >/= 97% throughout the session measured frequently.  Per patient's respiratory therapist seen in the hall after the session OK to trial patient on 3LO2/min next session with activity. Pt required only min A with sup to sit and no assist with sit to supine.  Pt demonstrated good functional strength during sit to/from stand transfers from an elevated EOB with no instability noted during initial stand.  Pt was able to amb 1 x 5' and then 1 x 8' after seated therapeutic rest break with good stability and only mild SOB.  Pt would benefit from OOB to chair with nursing stating would inquire about obtaining pt a bariatric recliner.  Pt will benefit from PT services in a SNF setting upon discharge to safely address deficits listed in patient problem list for decreased caregiver assistance and eventual return to PLOF.     Follow Up Recommendations  SNF     Equipment Recommendations  Other (comment) (TBD at next venue of care)    Recommendations for Other Services       Precautions / Restrictions Precautions Precautions: Fall Restrictions Weight Bearing Restrictions: No Other Position/Activity Restrictions: DNR    Mobility  Bed Mobility Overal  bed mobility: Needs Assistance Bed Mobility: Supine to Sit;Sit to Supine     Supine to sit: Min assist Sit to supine: Supervision   General bed mobility comments: Min A for left hand held assist to pull herself to edge of bed, no physical assist needed during sit to sup  Transfers Overall transfer level: Needs assistance Equipment used: Rolling walker (2 wheeled) Transfers: Sit to/from Stand Sit to Stand: Min guard;From elevated surface         General transfer comment: Good eccentric and concentric control and stability during sit to/from stand transfer training  Ambulation/Gait Ambulation/Gait assistance: Min guard Gait Distance (Feet): 8 Feet x 1, 5 Feet x 1 Assistive device: Rolling walker (2 wheeled) Gait Pattern/deviations: Step-through pattern;Trunk flexed;Decreased stride length;Wide base of support Gait velocity: decreased   General Gait Details: Wide BOS secondary to body habitus but steady without overt LOB and only min lean on the RW for support   Stairs             Wheelchair Mobility    Modified Rankin (Stroke Patients Only)       Balance Overall balance assessment: Needs assistance Sitting-balance support: Feet supported Sitting balance-Leahy Scale: Good Sitting balance - Comments: no loss of balance in sitting position   Standing balance support: Bilateral upper extremity supported;No upper extremity supported;During functional activity Standing balance-Leahy Scale: Good Standing balance comment: Pt steady with amb with a RW as well as during static and dynamic standing balance training without UE support  Cognition Arousal/Alertness: Awake/alert Behavior During Therapy: WFL for tasks assessed/performed Overall Cognitive Status: Within Functional Limits for tasks assessed                                        Exercises Total Joint Exercises Ankle Circles/Pumps:  AROM;Strengthening;Both;10 reps;15 reps Quad Sets: Strengthening;Both;10 reps;15 reps Gluteal Sets: Strengthening;Both;10 reps Long Arc Quad: Strengthening;Both;10 reps (with manual resistance) Knee Flexion: Strengthening;Both;10 reps (with manual resistance) Other Exercises Other Exercises: HEP education for BLE AP, QS, GS, and LAQs x 10 each every 1-2 hours daily  Static and dynamic standing balance training with reaching activities outside BOS    General Comments        Pertinent Vitals/Pain Pain Assessment: No/denies pain    Home Living                      Prior Function            PT Goals (current goals can now be found in the care plan section) Progress towards PT goals: Progressing toward goals    Frequency    Min 2X/week      PT Plan Current plan remains appropriate    Co-evaluation              AM-PAC PT "6 Clicks" Mobility   Outcome Measure  Help needed turning from your back to your side while in a flat bed without using bedrails?: None Help needed moving from lying on your back to sitting on the side of a flat bed without using bedrails?: None Help needed moving to and from a bed to a chair (including a wheelchair)?: None Help needed standing up from a chair using your arms (e.g., wheelchair or bedside chair)?: None Help needed to walk in hospital room?: A Lot Help needed climbing 3-5 steps with a railing? : Total 6 Click Score: 19    End of Session Equipment Utilized During Treatment: Gait belt;Oxygen Activity Tolerance: Patient tolerated treatment well Patient left: with call bell/phone within reach;in bed;with bed alarm set Nurse Communication: Mobility status PT Visit Diagnosis: Repeated falls (R29.6);Muscle weakness (generalized) (M62.81);Difficulty in walking, not elsewhere classified (R26.2)     Time: 13:45-14:27 PT Time Calculation (min) (ACUTE ONLY): 42 min  Charges:  $Gait Training: 8-22 mins $Therapeutic Exercise:  8-22 mins  $Therapeutic Activity: 8-22 mins                    D. Scott Dreon Pineda PT, DPT 04/05/20, 3:28 PM

## 2020-04-05 NOTE — TOC Progression Note (Addendum)
Transition of Care [facility]) - Progression Note    Patient Details  Name: Deanna Rice MRN: [id number] Date of Birth: 1955-08-29  [facility]) CM/SW Contact  Margarito Liner, LCSW Phone Number: 04/05/2020, 8:53 AM  Clinical Narrative:  PASARR obtained: [phone] F. Expires 3/11. Patient has one bed offer from [facility] in [place]. Left message for admissions coordinator to confirm they can offer and that patient's weight is not a barrier. Will present offer to patient once confirmed.  10:51 am: [place] was not aware of weight. Admissions coordinator will check to make sure they can still offer.  1:10 pm: [facility] DON said they can no longer offer because of her lymphedema.  Expected Discharge Plan: Skilled Nursing Facility Barriers to Discharge: Continued Medical Work up  Expected Discharge Plan and Services Expected Discharge Plan: Skilled Nursing Facility In-house Referral: Clinical Social Work   Post Acute Care Choice: Skilled Nursing Facility Living arrangements for the past 2 months: Assisted Living Facility [employer])                                       Social Determinants of Health (SDOH) Interventions    Readmission Risk Interventions Readmission Risk Prevention Plan 03/31/2020  Transportation Screening Complete  Palliative Care Screening Not Applicable  Medication Review (RN Care Manager) Complete  Some recent data might be hidden

## 2020-04-06 DIAGNOSIS — G9341 Metabolic encephalopathy: Secondary | ICD-10-CM | POA: Diagnosis not present

## 2020-04-06 DIAGNOSIS — N390 Urinary tract infection, site not specified: Secondary | ICD-10-CM | POA: Diagnosis not present

## 2020-04-06 DIAGNOSIS — M25562 Pain in left knee: Secondary | ICD-10-CM | POA: Diagnosis not present

## 2020-04-06 LAB — GLUCOSE, CAPILLARY
Glucose-Capillary: 126 mg/dL — ABNORMAL HIGH (ref 70–99)
Glucose-Capillary: 157 mg/dL — ABNORMAL HIGH (ref 70–99)
Glucose-Capillary: 138 mg/dL — ABNORMAL HIGH (ref 70–99)
Glucose-Capillary: 151 mg/dL — ABNORMAL HIGH (ref 70–99)

## 2020-04-06 NOTE — Progress Notes (Signed)
PROGRESS NOTE    Deanna Rice  VPX:[id number] DOB: [birth year] DOA: 03/29/2020 PCP: Lyndon Code, MD  Brief Narrative:  Chronically ill morbidly obese female BMI of 60, type 2 diabetes mellitus, COPD/chronic respiratory failure on 4 L home O2, chronic venous insufficiency, hypothyroidism, chronic diastolic CHF, chronic osteoarthritis, depression was brought to the ER after he had another fall, she resides at a local assisted living facility and usually ambulates only very short distances with an assist device, otherwise uses a wheelchair has been seen in the ER multiple times for recurrent falls, while in the emergency room she was noted to have visual hallucinations.  Work-up in the emergency room was relatively unrevealing except for abnormal urinalysis, ABG ruled out hypercarbia, no leukocytosis, CT head without acute findings, chest x-ray noted mild pulmonary venous congestion and basilar atelectasis she also had an MRI of her left knee which showed severe degenerative changes.  Also had MRI of the brain which did not have acute findings  Patient has been relatively stable and mental status has returned to baseline.  Patient medically stable for discharge at this time however cannot return to her local assisted living facility due to frequent falls and need for higher level of care.  Bed search initiated.  1 facility in [place] is unable to accept due to patient's lymphedema.   Assessment & Plan:   Principal Problem:   Acute metabolic encephalopathy Active Problems:   COPD (chronic obstructive pulmonary disease) (HCC)   HTN (hypertension)   Primary hypothyroidism   Type 2 diabetes mellitus with stage 3 chronic kidney disease (HCC)   Acute lower UTI  Visual hallucinations Acute metabolic encephalopathy -Had visual hallucinations on 2/2 and 2/3, on 2/4 had significantly increased agitation requiring Haldol x2, significantly improved since then -Etiology is unclear, urinalysis not very  impressive however recently grew pansensitive E. coli from urine culture last week -Completed 5 days of IV ceftriaxone -Clinically improved and back to baseline, no further evidence of hallucinations at this time -History not suggestive of any medication withdrawal, ammonia level within normal limits -TSH is high but free T4 is normal, will need thyroid function test repeated in 4 to 6 weeks Plan: No further antibiotics Avoid sedatives Delirium precautions Frequent reorienting measures Plan for skilled nursing facility at time of discharge  Possible UTI -See above, completed 5 days of ceftriaxone -Unfortunately urine culture did not get collected on admission  Morbid obesity -BMI 60.2 -Most significant comorbidity, contributing to ongoing functional decline  Frequent falls -Suspect this is secondary to morbid obesity, overall gait disorder and chronic osteoarthritis especially in her left knee -PT OT eval -needs SNF  COPD Chronic respiratory failure on 4 L home O2 -Stable, no wheezing continue nebs, ICS/LABA  History of CAD -Stable, continue aspirin, Plavix, metoprolol and nitrates -Decreased nitrate dose to avoid orthostatic hypotension  CKD 3a -Baseline creatinine around 1.6 -Stable, monitor  Type 2 diabetes mellitus -On 50 units of Lantus and NovoLog sliding scale at baseline -CBGs have been elevated -Basal bolus regimen escalated -Better control.  We will continue to monitor   DVT prophylaxis: Lovenox  Code Status: DNR Family Communication: None today Disposition Plan: Status is: Inpatient  Remains inpatient appropriate because:Unsafe d/c plan   Dispo: The patient is from: ALF              Anticipated d/c is to: SNF              Anticipated d/c date is: 1 day  Patient currently is medically stable to d/c.   Difficult to place patient Yes  Unable to find accepting facility at this time.  TOC aware is actively looking for  options.    Level of care: Med-Surg  Consultants:   None  Procedures:   None  Antimicrobials:      Subjective: Patient seen and examined.  Mental status appears baseline.  No hallucinations reported.  Denies pain complaints  Objective: Vitals:   04/06/20 0020 04/06/20 0429 04/06/20 0619 04/06/20 0932  BP: (!) 162/78 (!) 155/72 (!) 158/77 (!) 145/62  Pulse: 78 90 78 77  Resp: 17 18 16 18   Temp: 97.8 F (36.6 C) 99 F (37.2 C) 98.3 F (36.8 C) 98.2 F (36.8 C)  TempSrc: Oral  Oral Oral  SpO2: 99% 100% 100% 99%  Weight:      Height:        Intake/Output Summary (Last 24 hours) at 04/06/2020 1306 Last data filed at 04/06/2020 1000 Gross per 24 hour  Intake 358 ml  Output 2350 ml  Net -1992 ml   Filed Weights   03/29/20 0626 03/30/20 0028  Weight: (!) 179.2 kg (!) 174.4 kg    Examination:  General exam: No acute distress.  Appears chronically ill Respiratory system: Lung sounds decreased at bases.  Normal work of breathing.  Room air  cardiovascular system: Distant heart sounds, regular rate and rhythm, no murmurs Gastrointestinal system: Obese, nontender, nondistended, normal bowel sounds Central nervous system: Alert and oriented. No focal neurological deficits. Extremities: Symmetric 5 x 5 power. Skin: Scattered excoriations Psychiatry: Judgement and insight appear normal. Mood & affect appropriate.     Data Reviewed: I have personally reviewed following labs and imaging studies  CBC: Recent Labs  Lab 04/01/20 0839  WBC 5.0  HGB 8.2*  HCT 26.7*  MCV 91.4  PLT 128*   Basic Metabolic Panel: Recent Labs  Lab 03/31/20 0540 04/01/20 0839 04/05/20 0422  NA 138 141 140  K 4.1 4.1 4.5  CL 104 104 101  CO2 25 25 31   GLUCOSE 109* 129* 209*  BUN 33* 29* 27*  CREATININE 1.64* 1.54* 1.40*  CALCIUM 8.0* 8.3* 8.3*   GFR: Estimated Creatinine Clearance: 67.5 mL/min (A) (by C-G formula based on SCr of 1.4 mg/dL (H)). Liver Function  Tests: Recent Labs  Lab 04/01/20 0839  AST 18  ALT 17  ALKPHOS 81  BILITOT 0.7  PROT 5.9*  ALBUMIN 2.9*   No results for input(s): LIPASE, AMYLASE in the last 168 hours. No results for input(s): AMMONIA in the last 168 hours. Coagulation Profile: No results for input(s): INR, PROTIME in the last 168 hours. Cardiac Enzymes: No results for input(s): CKTOTAL, CKMB, CKMBINDEX, TROPONINI in the last 168 hours. BNP (last 3 results) No results for input(s): PROBNP in the last 8760 hours. HbA1C: No results for input(s): HGBA1C in the last 72 hours. CBG: Recent Labs  Lab 04/05/20 1155 04/05/20 1648 04/05/20 2100 04/06/20 0744 04/06/20 1233  GLUCAP 148* 141* 197* 126* 157*   Lipid Profile: No results for input(s): CHOL, HDL, LDLCALC, TRIG, CHOLHDL, LDLDIRECT in the last 72 hours. Thyroid Function Tests: No results for input(s): TSH, T4TOTAL, FREET4, T3FREE, THYROIDAB in the last 72 hours. Anemia Panel: No results for input(s): VITAMINB12, FOLATE, FERRITIN, TIBC, IRON, RETICCTPCT in the last 72 hours. Sepsis Labs: No results for input(s): PROCALCITON, LATICACIDVEN in the last 168 hours.  Recent Results (from the past 240 hour(s))  SARS CORONAVIRUS 2 (TAT 6-24 HRS) Nasopharyngeal  Nasopharyngeal Swab     Status: None   Collection Time: 03/29/20  3:35 PM   Specimen: Nasopharyngeal Swab  Result Value Ref Range Status   SARS Coronavirus 2 NEGATIVE NEGATIVE Final    Comment: (NOTE) SARS-CoV-2 target nucleic acids are NOT DETECTED.  The SARS-CoV-2 RNA is generally detectable in upper and lower respiratory specimens during the acute phase of infection. Negative results do not preclude SARS-CoV-2 infection, do not rule out co-infections with other pathogens, and should not be used as the sole basis for treatment or other patient management decisions. Negative results must be combined with clinical observations, patient history, and epidemiological information. The expected result  is Negative.  Fact Sheet for Patients: HairSlick.no  Fact Sheet for Healthcare Providers: quierodirigir.com  This test is not yet approved or cleared by the [place] FDA and  has been authorized for detection and/or diagnosis of SARS-CoV-2 by FDA under an Emergency Use Authorization (EUA). This EUA will remain  in effect (meaning this test can be used) for the duration of the COVID-19 declaration under Se ction 564(b)(1) of the Act, 21 U.S.C. section 360bbb-3(b)(1), unless the authorization is terminated or revoked sooner.  Performed at [facility], [address] [place], [place] [address]   MRSA PCR Screening     Status: None   Collection Time: 03/30/20  2:05 AM   Specimen: Nasal Mucosa; Nasopharyngeal  Result Value Ref Range Status   MRSA by PCR NEGATIVE NEGATIVE Final    Comment:        The GeneXpert MRSA Assay (FDA approved for NASAL specimens only), is one component of a comprehensive MRSA colonization surveillance program. It is not intended to diagnose MRSA infection nor to guide or monitor treatment for MRSA infections. Performed at [facility], [address], [place], [place] [address]          Radiology Studies: No results found.      Scheduled Meds: . allopurinol  100 mg Oral Daily  . aspirin EC  81 mg Oral Daily  . atorvastatin  40 mg Oral Daily  . diclofenac sodium  2 g Topical TID  . DULoxetine  60 mg Oral BID  . enoxaparin (LOVENOX) injection  0.5 mg/kg Subcutaneous Q24H  . fluticasone  2 spray Each Nare Daily  . insulin aspart  0-20 Units Subcutaneous TID WC  . insulin aspart  0-5 Units Subcutaneous QHS  . insulin aspart  3 Units Subcutaneous TID WC  . insulin glargine  35 Units Subcutaneous Daily  . isosorbide mononitrate  15 mg Oral Daily  . loratadine  10 mg Oral Daily  . melatonin  10 mg Oral QHS  . metoprolol succinate  25 mg Oral Daily  .  mometasone-formoterol  2 puff Inhalation BID  . multivitamin with minerals  1 tablet Oral Daily  . nystatin   Topical BID  . sodium chloride flush  3 mL Intravenous Q12H   Continuous Infusions: . sodium chloride 250 mL (03/30/20 1304)     LOS: 8 days    Time spent: 15 minutes    Tresa Moore, MD [facility] Pager [phone]  If 7PM-7AM, please contact night-coverage 04/06/2020, 1:06 PM

## 2020-04-06 NOTE — TOC Progression Note (Signed)
Transition of Care [facility]) - Progression Note    Patient Details  Name: Deanna Rice MRN: [id number] Date of Birth: 05-15-1955  Transition of [facility]) CM/SW Contact  Margarito Liner, LCSW Phone Number: 04/06/2020, 9:09 AM  Clinical Narrative: Sherron Monday to [facility] admissions coordinator yesterday. Patient only has Medicare Part B so she would have to pay 14 days worth of copays up front before they could take her ($290 per day). CSW called and spoke with [place] at [facility] to let her know. She said they don't feel like they can manage her needs without her getting rehab but she will discuss with upper management. CSW faxed last PT notes from 2/7 and 2/9 this morning for her to review.    Expected Discharge Plan: Skilled Nursing Facility Barriers to Discharge: Continued Medical Work up  Expected Discharge Plan and Services Expected Discharge Plan: Skilled Nursing Facility In-house Referral: Clinical Social Work   Post Acute Care Choice: Skilled Nursing Facility Living arrangements for the past 2 months: Assisted Living Facility [employer])                                       Social Determinants of Health (SDOH) Interventions    Readmission Risk Interventions Readmission Risk Prevention Plan 03/31/2020  Transportation Screening Complete  Palliative Care Screening Not Applicable  Medication Review (RN Care Manager) Complete  Some recent data might be hidden

## 2020-04-07 ENCOUNTER — Inpatient Hospital Stay: Payer: Medicare Other

## 2020-04-07 DIAGNOSIS — G9341 Metabolic encephalopathy: Secondary | ICD-10-CM | POA: Diagnosis not present

## 2020-04-07 DIAGNOSIS — N390 Urinary tract infection, site not specified: Secondary | ICD-10-CM | POA: Diagnosis not present

## 2020-04-07 DIAGNOSIS — M25562 Pain in left knee: Secondary | ICD-10-CM | POA: Diagnosis not present

## 2020-04-07 LAB — GLUCOSE, CAPILLARY
Glucose-Capillary: 203 mg/dL — ABNORMAL HIGH (ref 70–99)
Glucose-Capillary: 150 mg/dL — ABNORMAL HIGH (ref 70–99)
Glucose-Capillary: 122 mg/dL — ABNORMAL HIGH (ref 70–99)
Glucose-Capillary: 164 mg/dL — ABNORMAL HIGH (ref 70–99)
Glucose-Capillary: 242 mg/dL — ABNORMAL HIGH (ref 70–99)

## 2020-04-07 NOTE — Progress Notes (Signed)
Physical Therapy Treatment Patient Details Name: Deanna Rice MRN: [id number] DOB: 1955-09-24 Today's Date: 04/07/2020    History of Present Illness Patient is a 65 y.o. female with medical history significant for morbid obesity, diabetes mellitus, COPD with chronic respiratory failure, venous insufficiency, hypothyroidism, depression and CHF who was brought into the ER for evaluation following a fall with history of multiple falls. Noted to have visual hallucinations while in the ED. MD assessment includes: visual hallucinations, acute metabolic encephalopathy, possible UTI, and frequent falls.    PT Comments    Pt was pleasant and motivated to participate during the session.  Pt put forth good effort with below therex with SpO2 100% on 4LO2/min.  Nursing contacted and approved trial on 3LO2/min.  Pt remained at 99-100% SpO2 throughout the remainder of the session on 3LO2/min.  While sitting at the EOB performing therex pt became distant and less conversational and began to ask about "the horses in the room".  Pt then became very withdrawn and could not answer basis questions including her name. Nursing called and entered room for assessment.  Pt's BP, SpO2, and HR all WNL with pt slowly becoming more alert but not back to baseline.  Pt returned to supine for patient safety with transfers and amb deferred.  Pt will benefit from PT services in a SNF setting upon discharge to safely address deficits listed in patient problem list for decreased caregiver assistance and eventual return to PLOF.     Follow Up Recommendations  SNF     Equipment Recommendations  Other (comment) (TBD at next venue of care)    Recommendations for Other Services       Precautions / Restrictions Precautions Precautions: Fall Restrictions Weight Bearing Restrictions: No Other Position/Activity Restrictions: DNR    Mobility  Bed Mobility Overal bed mobility: Needs Assistance Bed Mobility: Supine to Sit;Sit to  Supine     Supine to sit: Supervision Sit to supine: Supervision   General bed mobility comments: Extra time and effort only    Transfers Overall transfer level: Needs assistance              Lateral/Scoot Transfers: Min guard General transfer comment: Sit to/from stand deferred secondary to change in mental status; pt able to laterally scoot towards the HOB x 3-4 small scoots  Ambulation/Gait             General Gait Details: Deferred secondary to change in mental status for pt safety   Stairs             Wheelchair Mobility    Modified Rankin (Stroke Patients Only)       Balance Overall balance assessment: Needs assistance Sitting-balance support: Feet supported Sitting balance-Leahy Scale: Good                                      Cognition Arousal/Alertness: Awake/alert Behavior During Therapy: WFL for tasks assessed/performed Overall Cognitive Status: Within Functional Limits for tasks assessed                                        Exercises Total Joint Exercises Ankle Circles/Pumps: AROM;Strengthening;Both;10 reps;5 reps Quad Sets: Strengthening;Both;10 reps;5 reps Gluteal Sets: Strengthening;Both;10 reps;5 reps Hip ABduction/ADduction: AAROM;AROM;Both;10 reps Long Arc Quad: Strengthening;Both;10 reps;15 reps Knee Flexion: Strengthening;Both;10 reps;15 reps Other Exercises Other  Exercises: Static unsupported sitting at EOB for core therex and increased activity tolerance    General Comments        Pertinent Vitals/Pain Pain Assessment: No/denies pain    Home Living                      Prior Function            PT Goals (current goals can now be found in the care plan section) Progress towards PT goals: Not progressing toward goals - comment (Decreased mental status during session)    Frequency    Min 2X/week      PT Plan Current plan remains appropriate    Co-evaluation               AM-PAC PT "6 Clicks" Mobility   Outcome Measure  Help needed turning from your back to your side while in a flat bed without using bedrails?: None Help needed moving from lying on your back to sitting on the side of a flat bed without using bedrails?: None Help needed moving to and from a bed to a chair (including a wheelchair)?: A Little Help needed standing up from a chair using your arms (e.g., wheelchair or bedside chair)?: A Little Help needed to walk in hospital room?: A Lot Help needed climbing 3-5 steps with a railing? : Total 6 Click Score: 17    End of Session Equipment Utilized During Treatment: Gait belt;Oxygen Activity Tolerance: Other (comment) (Decreased mental status during session) Patient left: with call bell/phone within reach;in bed;with bed alarm set;with nursing/sitter in room Nurse Communication: Mobility status;Other (comment) (Pt decreased mental status during session) PT Visit Diagnosis: Repeated falls (R29.6);Muscle weakness (generalized) (M62.81);Difficulty in walking, not elsewhere classified (R26.2)     Time: 5003-7048 PT Time Calculation (min) (ACUTE ONLY): 28 min  Charges:  $Therapeutic Exercise: 8-22 mins $Therapeutic Activity: 8-22 mins                     D. Elly Modena PT, DPT 04/07/20, 5:33 PM

## 2020-04-07 NOTE — Progress Notes (Signed)
PROGRESS NOTE    Deanna Rice  WCH:[id number] DOB: 1955-04-03 DOA: 03/29/2020 PCP: Lyndon Code, MD  Brief Narrative:  Chronically ill morbidly obese female BMI of 60, type 2 diabetes mellitus, COPD/chronic respiratory failure on 4 L home O2, chronic venous insufficiency, hypothyroidism, chronic diastolic CHF, chronic osteoarthritis, depression was brought to the ER after he had another fall, she resides at a local assisted living facility and usually ambulates only very short distances with an assist device, otherwise uses a wheelchair has been seen in the ER multiple times for recurrent falls, while in the emergency room she was noted to have visual hallucinations.  Work-up in the emergency room was relatively unrevealing except for abnormal urinalysis, ABG ruled out hypercarbia, no leukocytosis, CT head without acute findings, chest x-ray noted mild pulmonary venous congestion and basilar atelectasis she also had an MRI of her left knee which showed severe degenerative changes.  Also had MRI of the brain which did not have acute findings  Patient has been relatively stable and mental status has returned to baseline.  Patient medically stable for discharge at this time however cannot return to her local assisted living facility due to frequent falls and need for higher level of care.  Bed search initiated.   facility in [place] is unable to accept due to patient's lymphedema.   Assessment & Plan:   Principal Problem:   Acute metabolic encephalopathy Active Problems:   COPD (chronic obstructive pulmonary disease) (HCC)   HTN (hypertension)   Primary hypothyroidism   Type 2 diabetes mellitus with stage 3 chronic kidney disease (HCC)   Acute lower UTI  Visual hallucinations Acute metabolic encephalopathy -Had visual hallucinations on 2/2 and 2/3, on 2/4 had significantly increased agitation requiring Haldol x2, significantly improved since then -Etiology is unclear, urinalysis not very  impressive however recently grew pansensitive E. coli from urine culture last week -Completed 5 days of IV ceftriaxone -Clinically improved and back to baseline, no further evidence of hallucinations at this time -History not suggestive of any medication withdrawal, ammonia level within normal limits -TSH is high but free T4 is normal, will need thyroid function test repeated in 4 to 6 weeks Plan: No further antibiotics Avoid sedatives Delirium precautions Frequent reorienting measures Pending skilled nursing facility placement  Possible UTI -See above, completed 5 days of ceftriaxone -Unfortunately urine culture did not get collected on admission  Morbid obesity -BMI 60.2 -Most significant comorbidity, contributing to ongoing functional decline  Frequent falls -Suspect this is secondary to morbid obesity, overall gait disorder and chronic osteoarthritis especially in her left knee -PT OT eval -needs SNF  COPD Chronic respiratory failure on 4 L home O2 -Stable, no wheezing continue nebs, ICS/LABA  History of CAD -Stable, continue aspirin, Plavix, metoprolol and nitrates -Decreased nitrate dose to avoid orthostatic hypotension  CKD 3a -Baseline creatinine around 1.6 -Stable, monitor  Type 2 diabetes mellitus -On 50 units of Lantus and NovoLog sliding scale at baseline -CBGs have been elevated -Basal bolus regimen escalated -Better control.  We will continue to monitor   DVT prophylaxis: Lovenox  Code Status: DNR Family Communication: None today Disposition Plan: Status is: Inpatient  Remains inpatient appropriate because:Unsafe d/c plan   Dispo: The patient is from: ALF              Anticipated d/c is to: SNF              Anticipated d/c date is: 1 day  Patient currently is medically stable to d/c.   Difficult to place patient Yes  Unable to find accepting facility at this time.  TOC aware is actively looking for options.    Level of  care: Med-Surg  Consultants:   None  Procedures:   None  Antimicrobials:      Subjective: Patient seen and examined.  Mental status appears baseline.  No hallucinations reported.  Denies pain complaints  Objective: Vitals:   04/06/20 2026 04/07/20 0355 04/07/20 0938 04/07/20 1130  BP: 139/78 (!) 165/84 (!) 144/66 (!) 156/79  Pulse: 89 89 84 86  Resp: 20 18 17 18   Temp: 98.3 F (36.8 C) 98.3 F (36.8 C) 98.1 F (36.7 C) 98.6 F (37 C)  TempSrc:  Oral Oral   SpO2: 100% 95% 97% 100%  Weight:      Height:        Intake/Output Summary (Last 24 hours) at 04/07/2020 1315 Last data filed at 04/07/2020 1213 Gross per 24 hour  Intake 240 ml  Output 5050 ml  Net -4810 ml   Filed Weights   03/29/20 0626 03/30/20 0028  Weight: (!) 179.2 kg (!) 174.4 kg    Examination:  General exam: No acute distress.  Appears chronically ill Respiratory system: Lung sounds decreased at bases.  Normal work of breathing.  Room air  cardiovascular system: Distant heart sounds, regular rate and rhythm, no murmurs Gastrointestinal system: Obese, nontender, nondistended, normal bowel sounds Central nervous system: Alert and oriented. No focal neurological deficits. Extremities: Symmetric 5 x 5 power. Skin: Scattered excoriations Psychiatry: Judgement and insight appear normal. Mood & affect appropriate.     Data Reviewed: I have personally reviewed following labs and imaging studies  CBC: Recent Labs  Lab 04/01/20 0839  WBC 5.0  HGB 8.2*  HCT 26.7*  MCV 91.4  PLT 128*   Basic Metabolic Panel: Recent Labs  Lab 04/01/20 0839 04/05/20 0422  NA 141 140  K 4.1 4.5  CL 104 101  CO2 25 31  GLUCOSE 129* 209*  BUN 29* 27*  CREATININE 1.54* 1.40*  CALCIUM 8.3* 8.3*   GFR: Estimated Creatinine Clearance: 67.5 mL/min (A) (by C-G formula based on SCr of 1.4 mg/dL (H)). Liver Function Tests: Recent Labs  Lab 04/01/20 0839  AST 18  ALT 17  ALKPHOS 81  BILITOT 0.7  PROT  5.9*  ALBUMIN 2.9*   No results for input(s): LIPASE, AMYLASE in the last 168 hours. No results for input(s): AMMONIA in the last 168 hours. Coagulation Profile: No results for input(s): INR, PROTIME in the last 168 hours. Cardiac Enzymes: No results for input(s): CKTOTAL, CKMB, CKMBINDEX, TROPONINI in the last 168 hours. BNP (last 3 results) No results for input(s): PROBNP in the last 8760 hours. HbA1C: No results for input(s): HGBA1C in the last 72 hours. CBG: Recent Labs  Lab 04/06/20 1233 04/06/20 1635 04/06/20 2108 04/07/20 0743 04/07/20 1132  GLUCAP 157* 138* 151* 203* 150*   Lipid Profile: No results for input(s): CHOL, HDL, LDLCALC, TRIG, CHOLHDL, LDLDIRECT in the last 72 hours. Thyroid Function Tests: No results for input(s): TSH, T4TOTAL, FREET4, T3FREE, THYROIDAB in the last 72 hours. Anemia Panel: No results for input(s): VITAMINB12, FOLATE, FERRITIN, TIBC, IRON, RETICCTPCT in the last 72 hours. Sepsis Labs: No results for input(s): PROCALCITON, LATICACIDVEN in the last 168 hours.  Recent Results (from the past 240 hour(s))  SARS CORONAVIRUS 2 (TAT 6-24 HRS) Nasopharyngeal Nasopharyngeal Swab     Status: None   Collection  Time: 03/29/20  3:35 PM   Specimen: Nasopharyngeal Swab  Result Value Ref Range Status   SARS Coronavirus 2 NEGATIVE NEGATIVE Final    Comment: (NOTE) SARS-CoV-2 target nucleic acids are NOT DETECTED.  The SARS-CoV-2 RNA is generally detectable in upper and lower respiratory specimens during the acute phase of infection. Negative results do not preclude SARS-CoV-2 infection, do not rule out co-infections with other pathogens, and should not be used as the sole basis for treatment or other patient management decisions. Negative results must be combined with clinical observations, patient history, and epidemiological information. The expected result is Negative.  Fact Sheet for Patients: HairSlick.no  Fact  Sheet for Healthcare Providers: quierodirigir.com  This test is not yet approved or cleared by the [place] FDA and  has been authorized for detection and/or diagnosis of SARS-CoV-2 by FDA under an Emergency Use Authorization (EUA). This EUA will remain  in effect (meaning this test can be used) for the duration of the COVID-19 declaration under Se ction 564(b)(1) of the Act, 21 U.S.C. section 360bbb-3(b)(1), unless the authorization is terminated or revoked sooner.  Performed at [facility], [address], [place], [place] [address]   MRSA PCR Screening     Status: None   Collection Time: 03/30/20  2:05 AM   Specimen: Nasal Mucosa; Nasopharyngeal  Result Value Ref Range Status   MRSA by PCR NEGATIVE NEGATIVE Final    Comment:        The GeneXpert MRSA Assay (FDA approved for NASAL specimens only), is one component of a comprehensive MRSA colonization surveillance program. It is not intended to diagnose MRSA infection nor to guide or monitor treatment for MRSA infections. Performed at [facility], [address], [place], [place] [address]          Radiology Studies: No results found.      Scheduled Meds:  allopurinol  100 mg Oral Daily   aspirin EC  81 mg Oral Daily   atorvastatin  40 mg Oral Daily   diclofenac sodium  2 g Topical TID   DULoxetine  60 mg Oral BID   enoxaparin (LOVENOX) injection  0.5 mg/kg Subcutaneous Q24H   fluticasone  2 spray Each Nare Daily   insulin aspart  0-20 Units Subcutaneous TID WC   insulin aspart  0-5 Units Subcutaneous QHS   insulin aspart  3 Units Subcutaneous TID WC   insulin glargine  35 Units Subcutaneous Daily   isosorbide mononitrate  15 mg Oral Daily   loratadine  10 mg Oral Daily   melatonin  10 mg Oral QHS   metoprolol succinate  25 mg Oral Daily   mometasone-formoterol  2 puff Inhalation BID   multivitamin with minerals  1 tablet Oral Daily    nystatin   Topical BID   sodium chloride flush  3 mL Intravenous Q12H   Continuous Infusions:  sodium chloride 250 mL (03/30/20 1304)     LOS: 9 days    Time spent: 15 minutes    Tresa Moore, MD [facility] Pager [phone]  If 7PM-7AM, please contact night-coverage 04/07/2020, 1:15 PM

## 2020-04-08 DIAGNOSIS — M25562 Pain in left knee: Secondary | ICD-10-CM | POA: Diagnosis not present

## 2020-04-08 DIAGNOSIS — G9341 Metabolic encephalopathy: Secondary | ICD-10-CM | POA: Diagnosis not present

## 2020-04-08 DIAGNOSIS — N390 Urinary tract infection, site not specified: Secondary | ICD-10-CM | POA: Diagnosis not present

## 2020-04-08 LAB — GLUCOSE, CAPILLARY
Glucose-Capillary: 174 mg/dL — ABNORMAL HIGH (ref 70–99)
Glucose-Capillary: 225 mg/dL — ABNORMAL HIGH (ref 70–99)
Glucose-Capillary: 205 mg/dL — ABNORMAL HIGH (ref 70–99)
Glucose-Capillary: 200 mg/dL — ABNORMAL HIGH (ref 70–99)

## 2020-04-08 MED ORDER — DICLOFENAC SODIUM 1 % TD GEL
2.0000 g | Freq: Three times a day (TID) | TRANSDERMAL | Status: DC | PRN
  Filled 2020-04-08: qty 100

## 2020-04-08 NOTE — Progress Notes (Signed)
PROGRESS NOTE    Deanna LittenSandra C Rice  ZOX:[id number] DOB: 01/18/1956 DOA: 03/29/2020 PCP: Lyndon CodeKhan, Fozia M, MD  Brief Narrative:  Chronically ill morbidly obese female BMI of 60, type 2 diabetes mellitus, COPD/chronic respiratory failure on 4 L home O2, chronic venous insufficiency, hypothyroidism, chronic diastolic CHF, chronic osteoarthritis, depression was brought to the ER after he had another fall, she resides at a local assisted living facility and usually ambulates only very short distances with an assist device, otherwise uses a wheelchair has been seen in the ER multiple times for recurrent falls, while in the emergency room she was noted to have visual hallucinations.  Work-up in the emergency room was relatively unrevealing except for abnormal urinalysis, ABG ruled out hypercarbia, no leukocytosis, CT head without acute findings, chest x-ray noted mild pulmonary venous congestion and basilar atelectasis she also had an MRI of her left knee which showed severe degenerative changes.  Also had MRI of the brain which did not have acute findings  Patient has been relatively stable and mental status has returned to baseline.  Patient medically stable for discharge at this time however cannot return to her local assisted living facility due to frequent falls and need for higher level of care.  Bed search initiated.   [place] due to patient's lymphedema.   Assessment & Plan:   Principal Problem:   Acute metabolic encephalopathy Active Problems:   COPD (chronic obstructive pulmonary disease) (HCC)   HTN (hypertension)   Primary hypothyroidism   Type 2 diabetes mellitus with stage 3 chronic kidney disease (HCC)   Acute lower UTI  Visual hallucinations Acute metabolic encephalopathy -Had visual hallucinations on 2/2 and 2/3, on 2/4 had significantly increased agitation requiring Haldol x2, significantly improved since then -Etiology is unclear, urinalysis not very  impressive however recently grew pansensitive E. coli from urine culture last week -Completed 5 days of IV ceftriaxone -Clinically improved and back to baseline, no further evidence of hallucinations at this time -History not suggestive of any medication withdrawal, ammonia level within normal limits -TSH is high but free T4 is normal, will need thyroid function test repeated in 4 to 6 weeks 2/12: Patient had transient episode of AMS yesterday when working with physical therapy.  Unclear etiology.  Notified by bedside RN.  Head CT pursued which was negative for acute abnormality. Plan: No further antibiotics Avoid sedatives Delirium precautions Frequent reorienting measures Monitor mental status carefully Pending skilled nursing facility placement  Possible UTI -See above, completed 5 days of ceftriaxone -Unfortunately urine culture did not get collected on admission  Morbid obesity -BMI 60.2 -Most significant comorbidity, contributing to ongoing functional decline  Frequent falls -Suspect this is secondary to morbid obesity, overall gait disorder and chronic osteoarthritis especially in her left knee -PT OT eval -needs SNF  COPD Chronic respiratory failure on 4 L home O2 -Stable, no wheezing continue nebs, ICS/LABA  History of CAD -Stable, continue aspirin, Plavix, metoprolol and nitrates -Decreased nitrate dose to avoid orthostatic hypotension  CKD 3a -Baseline creatinine around 1.6 -Stable, monitor  Type 2 diabetes mellitus -On 50 units of Lantus and NovoLog sliding scale at baseline -CBGs have been elevated -Basal bolus regimen escalated -Better control.  We will continue to monitor   DVT prophylaxis: Lovenox  Code Status: DNR Family Communication: None today Disposition Plan: Status is: Inpatient  Remains inpatient appropriate because:Unsafe d/c plan   Dispo: The patient is from: ALF  Anticipated d/c is to: SNF              Anticipated  d/c date is: 1 day              Patient currently is medically stable to d/c.   Difficult to place patient Yes  Unable to find accepting facility at this time.  TOC aware is actively looking for options.    Level of care: Med-Surg  Consultants:   None  Procedures:   None  Antimicrobials:      Subjective: Patient seen and examined.  Resting comfortably in bed.  Alert and oriented this morning.  No hallucinations reported.  No pain reported.  Objective: Vitals:   04/07/20 2015 04/07/20 2338 04/08/20 0608 04/08/20 0826  BP:  (!) 151/77 (!) 157/83 (!) 162/82  Pulse:  86 89 88  Resp:  17 20 16   Temp:  98 F (36.7 C) 98.4 F (36.9 C) 98.2 F (36.8 C)  TempSrc:  Oral Oral Oral  SpO2: 99% 99% 99% 98%  Weight:      Height:        Intake/Output Summary (Last 24 hours) at 04/08/2020 1058 Last data filed at 04/08/2020 1038 Gross per 24 hour  Intake 720 ml  Output 1150 ml  Net -430 ml   Filed Weights   03/29/20 0626 03/30/20 0028  Weight: (!) 179.2 kg (!) 174.4 kg    Examination:  General exam: No acute distress.  Appears chronically ill Respiratory system: Lung sounds decreased at bases.  Normal work of breathing.  Room air  cardiovascular system: Distant heart sounds, regular rate and rhythm, no murmurs Gastrointestinal system: Obese, nontender, nondistended, normal bowel sounds Central nervous system: Alert and oriented. No focal neurological deficits. Extremities: Symmetric 5 x 5 power. Skin: Scattered excoriations Psychiatry: Judgement and insight appear normal. Mood & affect appropriate.     Data Reviewed: I have personally reviewed following labs and imaging studies  CBC: No results for input(s): WBC, NEUTROABS, HGB, HCT, MCV, PLT in the last 168 hours. Basic Metabolic Panel: Recent Labs  Lab 04/05/20 0422  NA 140  K 4.5  CL 101  CO2 31  GLUCOSE 209*  BUN 27*  CREATININE 1.40*  CALCIUM 8.3*   GFR: Estimated Creatinine Clearance: 67.5 mL/min  (A) (by C-G formula based on SCr of 1.4 mg/dL (H)). Liver Function Tests: No results for input(s): AST, ALT, ALKPHOS, BILITOT, PROT, ALBUMIN in the last 168 hours. No results for input(s): LIPASE, AMYLASE in the last 168 hours. No results for input(s): AMMONIA in the last 168 hours. Coagulation Profile: No results for input(s): INR, PROTIME in the last 168 hours. Cardiac Enzymes: No results for input(s): CKTOTAL, CKMB, CKMBINDEX, TROPONINI in the last 168 hours. BNP (last 3 results) No results for input(s): PROBNP in the last 8760 hours. HbA1C: No results for input(s): HGBA1C in the last 72 hours. CBG: Recent Labs  Lab 04/07/20 1132 04/07/20 1652 04/07/20 2013 04/07/20 2229 04/08/20 0751  GLUCAP 150* 122* 164* 242* 174*   Lipid Profile: No results for input(s): CHOL, HDL, LDLCALC, TRIG, CHOLHDL, LDLDIRECT in the last 72 hours. Thyroid Function Tests: No results for input(s): TSH, T4TOTAL, FREET4, T3FREE, THYROIDAB in the last 72 hours. Anemia Panel: No results for input(s): VITAMINB12, FOLATE, FERRITIN, TIBC, IRON, RETICCTPCT in the last 72 hours. Sepsis Labs: No results for input(s): PROCALCITON, LATICACIDVEN in the last 168 hours.  Recent Results (from the past 240 hour(s))  SARS CORONAVIRUS 2 (TAT 6-24 HRS) Nasopharyngeal  Nasopharyngeal Swab     Status: None   Collection Time: 03/29/20  3:35 PM   Specimen: Nasopharyngeal Swab  Result Value Ref Range Status   SARS Coronavirus 2 NEGATIVE NEGATIVE Final    Comment: (NOTE) SARS-CoV-2 target nucleic acids are NOT DETECTED.  The SARS-CoV-2 RNA is generally detectable in upper and lower respiratory specimens during the acute phase of infection. Negative results do not preclude SARS-CoV-2 infection, do not rule out co-infections with other pathogens, and should not be used as the sole basis for treatment or other patient management decisions. Negative results must be combined with clinical observations, patient history, and  epidemiological information. The expected result is Negative.  Fact Sheet for Patients: HairSlick.no  Fact Sheet for Healthcare Providers: quierodirigir.com  This test is not yet approved or cleared by the [place] FDA and  has been authorized for detection and/or diagnosis of SARS-CoV-2 by FDA under an Emergency Use Authorization (EUA). This EUA will remain  in effect (meaning this test can be used) for the duration of the COVID-19 declaration under Se ction 564(b)(1) of the Act, 21 U.S.C. section 360bbb-3(b)(1), unless the authorization is terminated or revoked sooner.  Performed at [facility], [address] [place], [place] [address]   MRSA PCR Screening     Status: None   Collection Time: 03/30/20  2:05 AM   Specimen: Nasal Mucosa; Nasopharyngeal  Result Value Ref Range Status   MRSA by PCR NEGATIVE NEGATIVE Final    Comment:        The GeneXpert MRSA Assay (FDA approved for NASAL specimens only), is one component of a comprehensive MRSA colonization surveillance program. It is not intended to diagnose MRSA infection nor to guide or monitor treatment for MRSA infections. Performed at [facility], [address], [place], [place] [address]          Radiology Studies: CT HEAD WO CONTRAST  Result Date: 04/07/2020 CLINICAL DATA:  Mental status change of unknown cause. EXAM: CT HEAD WITHOUT CONTRAST TECHNIQUE: Contiguous axial images were obtained from the base of the skull through the vertex without intravenous contrast. COMPARISON:  CT studies 03/29/2020.  MRI 03/29/2020. FINDINGS: Brain: No acute finding by CT. The brainstem and cerebellum are unremarkable. Mild chronic small-vessel change of the right cerebral hemispheric white matter. 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 No mass, hemorrhage, hydrocephalus or extra-axial collection. Vascular: There is atherosclerotic calcification of the major vessels at the base of the brain. Skull: Negative Sinuses/Orbits: Clear/normal Other: None IMPRESSION: No acute finding by CT. 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 Lesser old ischemic changes in the posterior frontal and parietal region. Electronically Signed   By: Paulina Fusi M.D.   On: 04/07/2020 19:41        Scheduled Meds: . allopurinol  100 mg Oral Daily  . aspirin EC  81 mg Oral Daily  . atorvastatin  40 mg Oral Daily  . DULoxetine  60 mg Oral BID  . enoxaparin (LOVENOX) injection  0.5 mg/kg Subcutaneous Q24H  . fluticasone  2 spray Each Nare Daily  . insulin aspart  0-20 Units Subcutaneous TID WC  . insulin aspart  0-5 Units Subcutaneous QHS  . insulin aspart  3 Units Subcutaneous TID WC  . insulin glargine  35 Units Subcutaneous Daily  . isosorbide mononitrate  15  mg Oral Daily  . loratadine  10 mg Oral Daily  . melatonin  10 mg Oral QHS  . metoprolol succinate  25 mg Oral Daily  . mometasone-formoterol  2 puff Inhalation BID  . multivitamin with minerals  1 tablet Oral Daily  . nystatin   Topical BID  . sodium chloride flush  3 mL Intravenous Q12H   Continuous Infusions: . sodium chloride 250 mL (03/30/20 1304)     LOS: 10 days    Time spent: 15 minutes    Tresa Moore, MD [facility] Pager [phone]  If 7PM-7AM, please contact night-coverage 04/08/2020, 10:58 AM

## 2020-04-09 DIAGNOSIS — M25562 Pain in left knee: Secondary | ICD-10-CM | POA: Diagnosis not present

## 2020-04-09 DIAGNOSIS — G9341 Metabolic encephalopathy: Secondary | ICD-10-CM | POA: Diagnosis not present

## 2020-04-09 DIAGNOSIS — N390 Urinary tract infection, site not specified: Secondary | ICD-10-CM | POA: Diagnosis not present

## 2020-04-09 LAB — GLUCOSE, CAPILLARY
Glucose-Capillary: 210 mg/dL — ABNORMAL HIGH (ref 70–99)
Glucose-Capillary: 237 mg/dL — ABNORMAL HIGH (ref 70–99)
Glucose-Capillary: 214 mg/dL — ABNORMAL HIGH (ref 70–99)
Glucose-Capillary: 274 mg/dL — ABNORMAL HIGH (ref 70–99)

## 2020-04-09 MED ORDER — AMLODIPINE BESYLATE 5 MG PO TABS
5.0000 mg | ORAL_TABLET | Freq: Every day | ORAL | Status: DC
  Administered 2020-04-09 – 2020-04-13 (×5): 5 mg via ORAL
  Filled 2020-04-09 (×5): qty 1

## 2020-04-09 NOTE — Progress Notes (Signed)
PROGRESS NOTE    Deanna Rice  TGY:[id number] DOB: [birth year] DOA: 03/29/2020 PCP: Lyndon Code, MD  Brief Narrative:  Chronically ill morbidly obese female BMI of 60, type 2 diabetes mellitus, COPD/chronic respiratory failure on 4 L home O2, chronic venous insufficiency, hypothyroidism, chronic diastolic CHF, chronic osteoarthritis, depression was brought to the ER after he had another fall, she resides at a local assisted living facility and usually ambulates only very short distances with an assist device, otherwise uses a wheelchair has been seen in the ER multiple times for recurrent falls, while in the emergency room she was noted to have visual hallucinations.  Work-up in the emergency room was relatively unrevealing except for abnormal urinalysis, ABG ruled out hypercarbia, no leukocytosis, CT head without acute findings, chest x-ray noted mild pulmonary venous congestion and basilar atelectasis she also had an MRI of her left knee which showed severe degenerative changes.  Also had MRI of the brain which did not have acute findings  Patient has been relatively stable and mental status has returned to baseline.  Patient medically stable for discharge at this time however cannot return to her local assisted living facility due to frequent falls and need for higher level of care.  Bed search initiated.   facility in [place] is unable to accept due to patient's lymphedema.   Assessment & Plan:   Principal Problem:   Acute metabolic encephalopathy Active Problems:   COPD (chronic obstructive pulmonary disease) (HCC)   HTN (hypertension)   Primary hypothyroidism   Type 2 diabetes mellitus with stage 3 chronic kidney disease (HCC)   Acute lower UTI  Visual hallucinations Acute metabolic encephalopathy -Had visual hallucinations on 2/2 and 2/3, on 2/4 had significantly increased agitation requiring Haldol x2, significantly improved since then -Etiology is unclear, urinalysis not very  impressive however recently grew pansensitive E. coli from urine culture last week -Completed 5 days of IV ceftriaxone -Clinically improved and back to baseline, no further evidence of hallucinations at this time -History not suggestive of any medication withdrawal, ammonia level within normal limits -TSH is high but free T4 is normal, will need thyroid function test repeated in 4 to 6 weeks 2/12: Patient had transient episode of AMS yesterday when working with physical therapy.  Unclear etiology.  Notified by bedside RN.  Head CT pursued which was negative for acute abnormality. Plan: No further need for antibiotics.  Avoid sedatives.  Continue delirium precautions and frequent reorienting measures. Stable for discharge to skilled nursing facility  Possible UTI -See above, completed 5 days of ceftriaxone -Unfortunately urine culture did not get collected on admission  Morbid obesity -BMI 60.2 -Most significant comorbidity, contributing to ongoing functional decline  Frequent falls -Suspect this is secondary to morbid obesity, overall gait disorder and chronic osteoarthritis especially in her left knee -PT OT eval -needs SNF  COPD Chronic respiratory failure on 4 L home O2 -Stable, no wheezing continue nebs, ICS/LABA  History of CAD -Stable, continue aspirin, Plavix, metoprolol and nitrates -Decreased nitrate dose to avoid orthostatic hypotension  CKD 3a -Baseline creatinine around 1.6 -Stable, monitor -   Type 2 diabetes mellitus -On 50 units of Lantus and NovoLog sliding scale at baseline -CBGs have been elevated -Basal bolus regimen escalated -Better control.  We will continue to monitor   DVT prophylaxis: Lovenox  Code Status: DNR Family Communication: None today Disposition Plan: Status is: Inpatient  Remains inpatient appropriate because:Unsafe d/c plan   Dispo: The patient is from: ALF  Anticipated d/c is to: SNF              Anticipated  d/c date is: 1 day              Patient currently is medically stable to d/c.   Difficult to place patient Yes  Unable to find accepting facility at this time.  TOC aware is actively looking for options.    Level of care: Med-Surg  Consultants:   None  Procedures:   None  Antimicrobials:      Subjective: Patient seen and examined.  Resting comfortably in bed.  No visible distress.  No confusion.  No pain complaints.  Objective: Vitals:   04/08/20 2103 04/09/20 0019 04/09/20 0513 04/09/20 0755  BP: (!) 154/98 (!) 157/76 (!) 165/86 (!) 177/80  Pulse: 93 81 81 79  Resp: 20 18 18 16   Temp: 98.8 F (37.1 C) 98.2 F (36.8 C) 98.3 F (36.8 C) 98.1 F (36.7 C)  TempSrc: Oral  Oral Oral  SpO2: 98% 99% 98% 98%  Weight:      Height:        Intake/Output Summary (Last 24 hours) at 04/09/2020 1217 Last data filed at 04/09/2020 1037 Gross per 24 hour  Intake 720 ml  Output 3800 ml  Net -3080 ml   Filed Weights   03/29/20 0626 03/30/20 0028  Weight: (!) 179.2 kg (!) 174.4 kg    Examination:  General exam: No acute distress.  Appears chronically ill Respiratory system: Lung sounds decreased at bases.  Normal work of breathing.  Room air  cardiovascular system: Distant heart sounds, regular rate and rhythm, no murmurs Gastrointestinal system: Obese, nontender, nondistended, normal bowel sounds Central nervous system: Alert and oriented. No focal neurological deficits. Extremities: Symmetric 5 x 5 power. Skin: Scattered excoriations Psychiatry: Judgement and insight appear normal. Mood & affect appropriate.     Data Reviewed: I have personally reviewed following labs and imaging studies  CBC: No results for input(s): WBC, NEUTROABS, HGB, HCT, MCV, PLT in the last 168 hours. Basic Metabolic Panel: Recent Labs  Lab 04/05/20 0422  NA 140  K 4.5  CL 101  CO2 31  GLUCOSE 209*  BUN 27*  CREATININE 1.40*  CALCIUM 8.3*   GFR: Estimated Creatinine Clearance:  67.5 mL/min (A) (by C-G formula based on SCr of 1.4 mg/dL (H)). Liver Function Tests: No results for input(s): AST, ALT, ALKPHOS, BILITOT, PROT, ALBUMIN in the last 168 hours. No results for input(s): LIPASE, AMYLASE in the last 168 hours. No results for input(s): AMMONIA in the last 168 hours. Coagulation Profile: No results for input(s): INR, PROTIME in the last 168 hours. Cardiac Enzymes: No results for input(s): CKTOTAL, CKMB, CKMBINDEX, TROPONINI in the last 168 hours. BNP (last 3 results) No results for input(s): PROBNP in the last 8760 hours. HbA1C: No results for input(s): HGBA1C in the last 72 hours. CBG: Recent Labs  Lab 04/08/20 1148 04/08/20 1627 04/08/20 2104 04/09/20 0732 04/09/20 1121  GLUCAP 225* 205* 200* 210* 237*   Lipid Profile: No results for input(s): CHOL, HDL, LDLCALC, TRIG, CHOLHDL, LDLDIRECT in the last 72 hours. Thyroid Function Tests: No results for input(s): TSH, T4TOTAL, FREET4, T3FREE, THYROIDAB in the last 72 hours. Anemia Panel: No results for input(s): VITAMINB12, FOLATE, FERRITIN, TIBC, IRON, RETICCTPCT in the last 72 hours. Sepsis Labs: No results for input(s): PROCALCITON, LATICACIDVEN in the last 168 hours.  No results found for this or any previous visit (from the past 240 hour(s)).  Radiology Studies: CT HEAD WO CONTRAST  Result Date: 04/07/2020 CLINICAL DATA:  Mental status change of unknown cause. EXAM: CT HEAD WITHOUT CONTRAST TECHNIQUE: Contiguous axial images were obtained from the base of the skull through the vertex without intravenous contrast. COMPARISON:  CT studies 03/29/2020.  MRI 03/29/2020. FINDINGS: Brain: No acute finding by CT. The brainstem and cerebellum are unremarkable. Mild chronic small-vessel change of the right cerebral hemispheric white matter. 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 No  mass, hemorrhage, hydrocephalus or extra-axial collection. Vascular: There is atherosclerotic calcification of the major vessels at the base of the brain. Skull: Negative Sinuses/Orbits: Clear/normal Other: None IMPRESSION: No acute finding by CT. 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 Lesser old ischemic changes in the posterior frontal and parietal region. Electronically Signed   By: Paulina Fusi M.D.   On: 04/07/2020 19:41        Scheduled Meds: . allopurinol  100 mg Oral Daily  . amLODipine  5 mg Oral Daily  . aspirin EC  81 mg Oral Daily  . atorvastatin  40 mg Oral Daily  . DULoxetine  60 mg Oral BID  . enoxaparin (LOVENOX) injection  0.5 mg/kg Subcutaneous Q24H  . fluticasone  2 spray Each Nare Daily  . insulin aspart  0-20 Units Subcutaneous TID WC  . insulin aspart  0-5 Units Subcutaneous QHS  . insulin aspart  3 Units Subcutaneous TID WC  . insulin glargine  35 Units Subcutaneous Daily  . isosorbide mononitrate  15 mg Oral Daily  . loratadine  10 mg Oral Daily  . melatonin  10 mg Oral QHS  . metoprolol succinate  25 mg Oral Daily  . mometasone-formoterol  2 puff Inhalation BID  . multivitamin with minerals  1 tablet Oral Daily  . nystatin   Topical BID  . sodium chloride flush  3 mL Intravenous Q12H   Continuous Infusions: . sodium chloride 250 mL (03/30/20 1304)     LOS: 11 days    Time spent: 15 minutes    Tresa Moore, MD [facility] Pager [phone]  If 7PM-7AM, please contact night-coverage 04/09/2020, 12:17 PM

## 2020-04-10 DIAGNOSIS — M25562 Pain in left knee: Secondary | ICD-10-CM | POA: Diagnosis not present

## 2020-04-10 DIAGNOSIS — N390 Urinary tract infection, site not specified: Secondary | ICD-10-CM | POA: Diagnosis not present

## 2020-04-10 DIAGNOSIS — G9341 Metabolic encephalopathy: Secondary | ICD-10-CM | POA: Diagnosis not present

## 2020-04-10 LAB — GLUCOSE, CAPILLARY
Glucose-Capillary: 200 mg/dL — ABNORMAL HIGH (ref 70–99)
Glucose-Capillary: 182 mg/dL — ABNORMAL HIGH (ref 70–99)
Glucose-Capillary: 207 mg/dL — ABNORMAL HIGH (ref 70–99)
Glucose-Capillary: 311 mg/dL — ABNORMAL HIGH (ref 70–99)

## 2020-04-10 MED ORDER — CLOPIDOGREL BISULFATE 75 MG PO TABS
75.0000 mg | ORAL_TABLET | Freq: Every day | ORAL | Status: DC
  Administered 2020-04-10 – 2020-04-13 (×4): 75 mg via ORAL
  Filled 2020-04-10 (×4): qty 1

## 2020-04-10 MED ORDER — METOPROLOL SUCCINATE ER 50 MG PO TB24
50.0000 mg | ORAL_TABLET | Freq: Every day | ORAL | Status: DC
Start: 2020-04-11 — End: 2020-04-13
  Administered 2020-04-11 – 2020-04-13 (×3): 50 mg via ORAL
  Filled 2020-04-10 (×3): qty 1

## 2020-04-10 NOTE — Progress Notes (Signed)
PROGRESS NOTE    Deanna Rice  DDU:[id number] DOB: June 15, 1955 DOA: 03/29/2020 PCP: Lyndon Code, MD  Brief Narrative:  Chronically ill morbidly obese female BMI of 60, type 2 diabetes mellitus, COPD/chronic respiratory failure on 4 L home O2, chronic venous insufficiency, hypothyroidism, chronic diastolic CHF, chronic osteoarthritis, depression was brought to the ER after he had another fall, she resides at a local assisted living facility and usually ambulates only very short distances with an assist device, otherwise uses a wheelchair has been seen in the ER multiple times for recurrent falls, while in the emergency room she was noted to have visual hallucinations.  Work-up in the emergency room was relatively unrevealing except for abnormal urinalysis, ABG ruled out hypercarbia, no leukocytosis, CT head without acute findings, chest x-ray noted mild pulmonary venous congestion and basilar atelectasis she also had an MRI of her left knee which showed severe degenerative changes.  Also had MRI of the brain which did not have acute findings  Patient has been relatively stable and mental status has returned to baseline.  Patient medically stable for discharge at this time however cannot return to her local assisted living facility due to frequent falls and need for higher level of care.  Bed search initiated.   facility in [place] is unable to accept due to patient's lymphedema. 2/14: No accepting skilled nursing facility found as of yet   Assessment & Plan:   Principal Problem:   Acute metabolic encephalopathy Active Problems:   COPD (chronic obstructive pulmonary disease) (HCC)   HTN (hypertension)   Primary hypothyroidism   Type 2 diabetes mellitus with stage 3 chronic kidney disease (HCC)   Acute lower UTI  Visual hallucinations Acute metabolic encephalopathy -Had visual hallucinations on 2/2 and 2/3, on 2/4 had significantly increased agitation requiring Haldol x2, significantly  improved since then -Etiology is unclear, urinalysis not very impressive however recently grew pansensitive E. coli from urine culture last week -Completed 5 days of IV ceftriaxone -Clinically improved and back to baseline, no further evidence of hallucinations at this time -History not suggestive of any medication withdrawal, ammonia level within normal limits -TSH is high but free T4 is normal, will need thyroid function test repeated in 4 to 6 weeks 2/12: Patient had transient episode of AMS yesterday when working with physical therapy.  Unclear etiology.  Notified by bedside RN.  Head CT pursued which was negative for acute abnormality. Plan: No further indication for antibiotics.  Will avoid sedatives.  Continue delirium precautions, frequent reorienting measures.  Patient medically stable for discharge to skilled nursing facility.  No accepting facility found yet.  Possible UTI -See above, completed 5 days of ceftriaxone -Unfortunately urine culture did not get collected on admission  Morbid obesity -BMI 60.2 -Most significant comorbidity, contributing to ongoing functional decline  Frequent falls -Suspect this is secondary to morbid obesity, overall gait disorder and chronic osteoarthritis especially in her left knee -PT OT eval -needs SNF  COPD Chronic respiratory failure on 4 L home O2 -Stable, no wheezing continue nebs, ICS/LABA  History of CAD -Stable, continue aspirin, Plavix, metoprolol and nitrates -Decreased nitrate dose to avoid orthostatic hypotension  CKD 3a -Baseline creatinine around 1.6 -Stable, monitor -   Type 2 diabetes mellitus -On 50 units of Lantus and NovoLog sliding scale at baseline -CBGs have been elevated -Basal bolus regimen escalated -Better control.  We will continue to monitor   DVT prophylaxis: Lovenox  Code Status: DNR Family Communication: None today Disposition Plan: Status  is: Inpatient  Remains inpatient appropriate  because:Unsafe d/c plan   Dispo: The patient is from: ALF              Anticipated d/c is to: SNF              Anticipated d/c date is: 1 day              Patient currently is medically stable to d/c.   Difficult to place patient Yes  Unable to find accepting facility at this time.  TOC aware is actively looking for options.    Level of care: Med-Surg  Consultants:   None  Procedures:   None  Antimicrobials:      Subjective: Patient seen and examined.  Sitting up in chair.  No visible distress.  In good spirits.  Objective: Vitals:   04/09/20 1525 04/09/20 1947 04/09/20 2321 04/10/20 0431  BP: (!) 147/77 (!) 149/81 (!) 159/87 (!) 151/82  Pulse: 80 81 80 78  Resp:      Temp:  99.1 F (37.3 C) 98.9 F (37.2 C) 98.3 F (36.8 C)  TempSrc:  Oral Oral Oral  SpO2: 100% 95% 100% 97%  Weight:      Height:        Intake/Output Summary (Last 24 hours) at 04/10/2020 1093 Last data filed at 04/09/2020 1904 Gross per 24 hour  Intake 480 ml  Output 900 ml  Net -420 ml   Filed Weights   03/29/20 0626 03/30/20 0028  Weight: (!) 179.2 kg (!) 174.4 kg    Examination:  General exam: No acute distress.  Appears chronically ill Respiratory system: Lung sounds decreased at bases.  Normal work of breathing.  Room air  cardiovascular system: Distant heart sounds, regular rate and rhythm, no murmurs Gastrointestinal system: Obese, nontender, nondistended, normal bowel sounds Central nervous system: Alert and oriented. No focal neurological deficits. Extremities: Symmetric 5 x 5 power. Skin: Scattered excoriations Psychiatry: Judgement and insight appear normal. Mood & affect appropriate.     Data Reviewed: I have personally reviewed following labs and imaging studies  CBC: No results for input(s): WBC, NEUTROABS, HGB, HCT, MCV, PLT in the last 168 hours. Basic Metabolic Panel: Recent Labs  Lab 04/05/20 0422  NA 140  K 4.5  CL 101  CO2 31  GLUCOSE 209*  BUN 27*   CREATININE 1.40*  CALCIUM 8.3*   GFR: Estimated Creatinine Clearance: 67.5 mL/min (A) (by C-G formula based on SCr of 1.4 mg/dL (H)). Liver Function Tests: No results for input(s): AST, ALT, ALKPHOS, BILITOT, PROT, ALBUMIN in the last 168 hours. No results for input(s): LIPASE, AMYLASE in the last 168 hours. No results for input(s): AMMONIA in the last 168 hours. Coagulation Profile: No results for input(s): INR, PROTIME in the last 168 hours. Cardiac Enzymes: No results for input(s): CKTOTAL, CKMB, CKMBINDEX, TROPONINI in the last 168 hours. BNP (last 3 results) No results for input(s): PROBNP in the last 8760 hours. HbA1C: No results for input(s): HGBA1C in the last 72 hours. CBG: Recent Labs  Lab 04/08/20 2104 04/09/20 0732 04/09/20 1121 04/09/20 1625 04/09/20 2144  GLUCAP 200* 210* 237* 214* 274*   Lipid Profile: No results for input(s): CHOL, HDL, LDLCALC, TRIG, CHOLHDL, LDLDIRECT in the last 72 hours. Thyroid Function Tests: No results for input(s): TSH, T4TOTAL, FREET4, T3FREE, THYROIDAB in the last 72 hours. Anemia Panel: No results for input(s): VITAMINB12, FOLATE, FERRITIN, TIBC, IRON, RETICCTPCT in the last 72 hours. Sepsis Labs: No results  for input(s): PROCALCITON, LATICACIDVEN in the last 168 hours.  No results found for this or any previous visit (from the past 240 hour(s)).       Radiology Studies: No results found.      Scheduled Meds: . allopurinol  100 mg Oral Daily  . amLODipine  5 mg Oral Daily  . aspirin EC  81 mg Oral Daily  . atorvastatin  40 mg Oral Daily  . DULoxetine  60 mg Oral BID  . enoxaparin (LOVENOX) injection  0.5 mg/kg Subcutaneous Q24H  . fluticasone  2 spray Each Nare Daily  . insulin aspart  0-20 Units Subcutaneous TID WC  . insulin aspart  0-5 Units Subcutaneous QHS  . insulin aspart  3 Units Subcutaneous TID WC  . insulin glargine  35 Units Subcutaneous Daily  . isosorbide mononitrate  15 mg Oral Daily  .  loratadine  10 mg Oral Daily  . melatonin  10 mg Oral QHS  . metoprolol succinate  25 mg Oral Daily  . mometasone-formoterol  2 puff Inhalation BID  . multivitamin with minerals  1 tablet Oral Daily  . nystatin   Topical BID  . sodium chloride flush  3 mL Intravenous Q12H   Continuous Infusions: . sodium chloride 250 mL (03/30/20 1304)     LOS: 12 days    Time spent: 15 minutes    Tresa Moore, MD [facility] Pager [phone]  If 7PM-7AM, please contact night-coverage 04/10/2020, 6:20 AM

## 2020-04-10 NOTE — TOC Progression Note (Addendum)
Transition of Care [facility]) - Progression Note    Patient Details  Name: Deanna Rice MRN: [id number] Date of Birth: 02-21-1956  [facility] CM/SW Contact  Margarito Liner, LCSW Phone Number: 04/10/2020, 1:20 PM  Clinical Narrative:   Still no bed offers. Discussed with [facility] admissions coordinator. He will review again. Financial counselor will confirm patient has Medicare Part B only.  2:24 pm: Artist confirmed patient has Medicare Part B only. CSW called [employer] and spoke Zella Ball. She will come to hospital tomorrow to assess patient to see if they can come back. Sent secure chat to MD, RN, and PT to see if a PT could be available to assist with the assessment. Patient has been updated.  Expected Discharge Plan: Skilled Nursing Facility Barriers to Discharge: Continued Medical Work up  Expected Discharge Plan and Services Expected Discharge Plan: Skilled Nursing Facility In-house Referral: Clinical Social Work   Post Acute Care Choice: Skilled Nursing Facility Living arrangements for the past 2 months: Assisted Living Facility [employer])                                       Social Determinants of Health (SDOH) Interventions    Readmission Risk Interventions Readmission Risk Prevention Plan 03/31/2020  Transportation Screening Complete  Palliative Care Screening Not Applicable  Medication Review (RN Care Manager) Complete  Some recent data might be hidden

## 2020-04-10 NOTE — Progress Notes (Signed)
Physical Therapy Treatment Patient Details Name: Deanna Rice MRN: [id number] DOB: [birth year] Today's Date: 04/10/2020    History of Present Illness Patient is a 65 y.o. female with medical history significant for morbid obesity, diabetes mellitus, COPD with chronic respiratory failure, venous insufficiency, hypothyroidism, depression and CHF who was brought into the ER for evaluation following a fall with history of multiple falls. Noted to have visual hallucinations while in the ED. MD assessment includes: visual hallucinations, acute metabolic encephalopathy, possible UTI, and frequent falls.    PT Comments    Pt was pleasant and motivated to participate during the session.  Pt presented with no AMS during the session and was able to stand and take several small steps near the EOB and then from bed to chair with good control and stability.  Pt found on 2LO2/min with SpO2 >/= 92% throughout the session and mostly at 95-96%.  Pt reported no adverse symptoms during the session and reported that her bariatric chair was comfortable.  Pt will benefit from PT services in a SNF setting upon discharge to safely address deficits listed in patient problem list for decreased caregiver assistance and eventual return to PLOF.     Follow Up Recommendations  SNF     Equipment Recommendations  Other (comment) (TBD at next venue of care)    Recommendations for Other Services       Precautions / Restrictions Precautions Precautions: Fall Restrictions Other Position/Activity Restrictions: DNR    Mobility  Bed Mobility Overal bed mobility: Needs Assistance       Supine to sit: Supervision     General bed mobility comments: Extra time and effort only    Transfers Overall transfer level: Needs assistance Equipment used: Rolling walker (2 wheeled) Transfers: Sit to/from Stand Sit to Stand: Min guard;From elevated surface         General transfer comment: Multiple rocking attempts but pt  able to stand without physical assistance  Ambulation/Gait Ambulation/Gait assistance: Min guard Gait Distance (Feet): 3 Feet Assistive device: Rolling walker (2 wheeled) Gait Pattern/deviations: Step-to pattern;Decreased step length - right;Decreased step length - left;Wide base of support Gait velocity: decreased   General Gait Details: Pt steady with wide BOS secondary to body habitus with SpO2 >/= 92% during the session on 2LO2/min   Stairs             Wheelchair Mobility    Modified Rankin (Stroke Patients Only)       Balance Overall balance assessment: Needs assistance Sitting-balance support: Feet supported Sitting balance-Leahy Scale: Good Sitting balance - Comments: no loss of balance in sitting position   Standing balance support: Bilateral upper extremity supported;During functional activity Standing balance-Leahy Scale: Good Standing balance comment: Min lean on the RW for support                            Cognition Arousal/Alertness: Awake/alert Behavior During Therapy: WFL for tasks assessed/performed Overall Cognitive Status: Within Functional Limits for tasks assessed                                        Exercises Total Joint Exercises Ankle Circles/Pumps: AROM;Strengthening;Both;10 reps;5 reps (with manual resistance) Quad Sets: Strengthening;Both;10 reps;5 reps Gluteal Sets: Strengthening;Both;10 reps;5 reps Heel Slides: AROM;Strengthening;Both;10 reps Long Arc Quad: Strengthening;Both;10 reps;15 reps Knee Flexion: Strengthening;Both;10 reps;15 reps    General Comments  Pertinent Vitals/Pain Pain Assessment: No/denies pain    Home Living                      Prior Function            PT Goals (current goals can now be found in the care plan section) Progress towards PT goals: Progressing toward goals    Frequency    Min 2X/week      PT Plan Current plan remains appropriate     Co-evaluation              AM-PAC PT "6 Clicks" Mobility   Outcome Measure  Help needed turning from your back to your side while in a flat bed without using bedrails?: None Help needed moving from lying on your back to sitting on the side of a flat bed without using bedrails?: None Help needed moving to and from a bed to a chair (including a wheelchair)?: A Little Help needed standing up from a chair using your arms (e.g., wheelchair or bedside chair)?: A Little Help needed to walk in hospital room?: A Lot Help needed climbing 3-5 steps with a railing? : Total 6 Click Score: 17    End of Session Equipment Utilized During Treatment: Gait belt;Oxygen Activity Tolerance: Patient tolerated treatment well Patient left: in chair;with chair alarm set Nurse Communication: Mobility status PT Visit Diagnosis: Repeated falls (R29.6);Muscle weakness (generalized) (M62.81);Difficulty in walking, not elsewhere classified (R26.2)     Time: 9381-8299 PT Time Calculation (min) (ACUTE ONLY): 24 min  Charges:  $Therapeutic Exercise: 23-37 mins                     D. Scott Prabhav Faulkenberry PT, DPT 04/10/20, 10:34 AM

## 2020-04-11 DIAGNOSIS — M25562 Pain in left knee: Secondary | ICD-10-CM | POA: Diagnosis not present

## 2020-04-11 DIAGNOSIS — G9341 Metabolic encephalopathy: Secondary | ICD-10-CM | POA: Diagnosis not present

## 2020-04-11 DIAGNOSIS — N390 Urinary tract infection, site not specified: Secondary | ICD-10-CM | POA: Diagnosis not present

## 2020-04-11 LAB — GLUCOSE, CAPILLARY
Glucose-Capillary: 278 mg/dL — ABNORMAL HIGH (ref 70–99)
Glucose-Capillary: 269 mg/dL — ABNORMAL HIGH (ref 70–99)
Glucose-Capillary: 225 mg/dL — ABNORMAL HIGH (ref 70–99)
Glucose-Capillary: 283 mg/dL — ABNORMAL HIGH (ref 70–99)

## 2020-04-11 MED ORDER — INSULIN GLARGINE 100 UNIT/ML ~~LOC~~ SOLN
5.0000 [IU] | Freq: Once | SUBCUTANEOUS | Status: AC
  Administered 2020-04-11: 5 [IU] via SUBCUTANEOUS
  Filled 2020-04-11: qty 0.05

## 2020-04-11 MED ORDER — INSULIN GLARGINE 100 UNIT/ML ~~LOC~~ SOLN
40.0000 [IU] | Freq: Every day | SUBCUTANEOUS | Status: DC
  Administered 2020-04-12 – 2020-04-13 (×2): 40 [IU] via SUBCUTANEOUS
  Filled 2020-04-11 (×3): qty 0.4

## 2020-04-11 NOTE — Progress Notes (Signed)
Inpatient Diabetes Program Recommendations  AACE/ADA: New Consensus Statement on Inpatient Glycemic Control (2015)  Target Ranges:  Prepandial:   less than 140 mg/dL      Peak postprandial:   less than 180 mg/dL (1-2 hours)      Critically ill patients:  140 - 180 mg/dL   Lab Results  Component Value Date   GLUCAP 278 (H) 04/11/2020   HGBA1C 7.6 (H) 03/29/2020    Review of Glycemic Control Results for CLEOLA, PERRYMAN (MRN [id number]) as of 04/11/2020 11:35  Ref. Range 04/10/2020 08:07 04/10/2020 11:18 04/10/2020 16:20 04/10/2020 21:10 04/11/2020 07:49  Glucose-Capillary Latest Ref Range: 70 - 99 mg/dL 852 (H) 778 (H) 242 (H) 311 (H) 278 (H)   Diabetes history: DM2 Outpatient Diabetes medications: Basaglar 18 units QAM, Basaglar 25 units QHS, Novolog sliding scale, Victoza 1.8 mg daily Current orders for Inpatient glycemic control: Lantus 35 units daily, Novolog 3 units tid meal coverage, Novolog 0-20 units TID with meals, Novolog 0-5 units QHS  Inpatient Diabetes Program Recommendations:   Increase Lantus to 40 units daily. Secure chat sent to Dr. Georgeann Oppenheim.  Thank you, Billy Fischer. Marcella Charlson, RN, MSN, CDE  Diabetes Coordinator Inpatient Glycemic Control Team Team Pager [phone] (8am-5pm) 04/11/2020 11:37 AM

## 2020-04-11 NOTE — TOC Progression Note (Signed)
Transition of Care [facility]) - Progression Note    Patient Details  Name: Deanna Rice MRN: [id number] Date of Birth: [birth year]  [facility]) CM/SW Contact  Chapman Fitch, RN Phone Number: 04/11/2020, 1:43 PM  Clinical Narrative:    Attempted to call Zella Ball at [facility] to confirm she was coming to assess patient today.  Spoke with Marylene Land at [facility] and she will give a message to Zella Ball to return my call    Expected Discharge Plan: Skilled Nursing Facility Barriers to Discharge: Continued Medical Work up  Expected Discharge Plan and Services Expected Discharge Plan: Skilled Nursing Facility In-house Referral: Clinical Social Work   Post Acute Care Choice: Skilled Nursing Facility Living arrangements for the past 2 months: Assisted Living Facility [employer])                                       Social Determinants of Health (SDOH) Interventions    Readmission Risk Interventions Readmission Risk Prevention Plan 03/31/2020  Transportation Screening Complete  Palliative Care Screening Not Applicable  Medication Review (RN Care Manager) Complete  Some recent data might be hidden

## 2020-04-11 NOTE — Progress Notes (Signed)
PROGRESS NOTE    Deanna Rice  VQQ:[id number] DOB: December 31, 1955 DOA: 03/29/2020 PCP: Lyndon Code, MD  Brief Narrative:  Chronically ill morbidly obese female BMI of 60, type 2 diabetes mellitus, COPD/chronic respiratory failure on 4 L home O2, chronic venous insufficiency, hypothyroidism, chronic diastolic CHF, chronic osteoarthritis, depression was brought to the ER after he had another fall, she resides at a local assisted living facility and usually ambulates only very short distances with an assist device, otherwise uses a wheelchair has been seen in the ER multiple times for recurrent falls, while in the emergency room she was noted to have visual hallucinations.  Work-up in the emergency room was relatively unrevealing except for abnormal urinalysis, ABG ruled out hypercarbia, no leukocytosis, CT head without acute findings, chest x-ray noted mild pulmonary venous congestion and basilar atelectasis she also had an MRI of her left knee which showed severe degenerative changes.  Also had MRI of the brain which did not have acute findings  Patient has been relatively stable and mental status has returned to baseline.  Patient medically stable for discharge at this time however cannot return to her local assisted living facility due to frequent falls and need for higher level of care.  Bed search initiated.   facility in [place] is unable to accept due to patient's lymphedema. 2/14: No accepting skilled nursing facility found as of yet.  ALF admissions coordinator will evaluate the patient in the hospital to determine appropriateness for return there.   Assessment & Plan:   Principal Problem:   Acute metabolic encephalopathy Active Problems:   COPD (chronic obstructive pulmonary disease) (HCC)   HTN (hypertension)   Primary hypothyroidism   Type 2 diabetes mellitus with stage 3 chronic kidney disease (HCC)   Acute lower UTI  Visual hallucinations Acute metabolic encephalopathy -Had  visual hallucinations on 2/2 and 2/3, on 2/4 had significantly increased agitation requiring Haldol x2, significantly improved since then -Etiology is unclear, urinalysis not very impressive however recently grew pansensitive E. coli from urine culture last week -Completed 5 days of IV ceftriaxone -Clinically improved and back to baseline, no further evidence of hallucinations at this time -History not suggestive of any medication withdrawal, ammonia level within normal limits -TSH is high but free T4 is normal, will need thyroid function test repeated in 4 to 6 weeks 2/12: Patient had transient episode of AMS yesterday when working with physical therapy.  Unclear etiology.  Notified by bedside RN.  Head CT pursued which was negative for acute abnormality. Plan: No further indication for antibiotics.  Will avoid sedatives.  Continue delirium precautions, frequent reorienting measures.  Patient medically stable for discharge to skilled nursing facility.  No accepting facility found yet.  ALF medical director will evaluate the patient today at bedside to determine whether she can return to their facility  Possible UTI -See above, completed 5 days of ceftriaxone -Unfortunately urine culture did not get collected on admission  Morbid obesity -BMI 60.2 -Most significant comorbidity, contributing to ongoing functional decline  Frequent falls -Suspect this is secondary to morbid obesity, overall gait disorder and chronic osteoarthritis especially in her left knee -PT OT eval -needs SNF  COPD Chronic respiratory failure on 4 L home O2 -Stable, no wheezing continue nebs, ICS/LABA  History of CAD -Stable, continue aspirin, Plavix, metoprolol and nitrates -Decreased nitrate dose to avoid orthostatic hypotension  CKD 3a -Baseline creatinine around 1.6 -Stable, monitor -   Type 2 diabetes mellitus -On 50 units of Lantus and  NovoLog sliding scale at baseline -CBGs have been  elevated -Basal bolus regimen escalated -Better control.  We will continue to monitor   DVT prophylaxis: Lovenox  Code Status: DNR Family Communication: None today Disposition Plan: Status is: Inpatient  Remains inpatient appropriate because:Unsafe d/c plan   Dispo: The patient is from: ALF              Anticipated d/c is to: SNF              Anticipated d/c date is: 1 day              Patient currently is medically stable to d/c.   Difficult to place patient Yes  Unable to find accepting facility at this time.  TOC aware is actively looking for options.    Level of care: Med-Surg  Consultants:   None  Procedures:   None  Antimicrobials:      Subjective: Patient seen and examined.  Sitting up in chair.  No visible distress.  In good spirits.  Objective: Vitals:   04/10/20 2231 04/11/20 0717 04/11/20 0757 04/11/20 1135  BP: (!) 149/71 (!) 174/77 (!) 148/78 (!) 167/76  Pulse: 86 83 82 99  Resp: 16 16 18 18   Temp: 98.5 F (36.9 C) 98.6 F (37 C) 97.9 F (36.6 C) 98.4 F (36.9 C)  TempSrc: Oral Oral Oral Oral  SpO2: 98% 99% 97% 94%  Weight:      Height:        Intake/Output Summary (Last 24 hours) at 04/11/2020 1245 Last data filed at 04/11/2020 0900 Gross per 24 hour  Intake 960 ml  Output --  Net 960 ml   Filed Weights   03/29/20 0626 03/30/20 0028  Weight: (!) 179.2 kg (!) 174.4 kg    Examination:  General exam: No acute distress.  Appears chronically ill Respiratory system: Lung sounds decreased at bases.  Normal work of breathing.  Room air  cardiovascular system: Distant heart sounds, regular rate and rhythm, no murmurs Gastrointestinal system: Obese, nontender, nondistended, normal bowel sounds Central nervous system: Alert and oriented. No focal neurological deficits. Extremities: Symmetric 5 x 5 power. Skin: Scattered excoriations Psychiatry: Judgement and insight appear normal. Mood & affect appropriate.     Data Reviewed: I have  personally reviewed following labs and imaging studies  CBC: No results for input(s): WBC, NEUTROABS, HGB, HCT, MCV, PLT in the last 168 hours. Basic Metabolic Panel: Recent Labs  Lab 04/05/20 0422  NA 140  K 4.5  CL 101  CO2 31  GLUCOSE 209*  BUN 27*  CREATININE 1.40*  CALCIUM 8.3*   GFR: Estimated Creatinine Clearance: 67.5 mL/min (A) (by C-G formula based on SCr of 1.4 mg/dL (H)). Liver Function Tests: No results for input(s): AST, ALT, ALKPHOS, BILITOT, PROT, ALBUMIN in the last 168 hours. No results for input(s): LIPASE, AMYLASE in the last 168 hours. No results for input(s): AMMONIA in the last 168 hours. Coagulation Profile: No results for input(s): INR, PROTIME in the last 168 hours. Cardiac Enzymes: No results for input(s): CKTOTAL, CKMB, CKMBINDEX, TROPONINI in the last 168 hours. BNP (last 3 results) No results for input(s): PROBNP in the last 8760 hours. HbA1C: No results for input(s): HGBA1C in the last 72 hours. CBG: Recent Labs  Lab 04/10/20 1118 04/10/20 1620 04/10/20 2110 04/11/20 0749 04/11/20 1136  GLUCAP 182* 207* 311* 278* 269*   Lipid Profile: No results for input(s): CHOL, HDL, LDLCALC, TRIG, CHOLHDL, LDLDIRECT in the last 72  hours. Thyroid Function Tests: No results for input(s): TSH, T4TOTAL, FREET4, T3FREE, THYROIDAB in the last 72 hours. Anemia Panel: No results for input(s): VITAMINB12, FOLATE, FERRITIN, TIBC, IRON, RETICCTPCT in the last 72 hours. Sepsis Labs: No results for input(s): PROCALCITON, LATICACIDVEN in the last 168 hours.  No results found for this or any previous visit (from the past 240 hour(s)).       Radiology Studies: No results found.      Scheduled Meds: . allopurinol  100 mg Oral Daily  . amLODipine  5 mg Oral Daily  . aspirin EC  81 mg Oral Daily  . atorvastatin  40 mg Oral Daily  . clopidogrel  75 mg Oral Daily  . DULoxetine  60 mg Oral BID  . enoxaparin (LOVENOX) injection  0.5 mg/kg Subcutaneous  Q24H  . fluticasone  2 spray Each Nare Daily  . insulin aspart  0-20 Units Subcutaneous TID WC  . insulin aspart  0-5 Units Subcutaneous QHS  . insulin aspart  3 Units Subcutaneous TID WC  . [START ON 04/12/2020] insulin glargine  40 Units Subcutaneous Daily  . insulin glargine  5 Units Subcutaneous Once  . isosorbide mononitrate  15 mg Oral Daily  . loratadine  10 mg Oral Daily  . melatonin  10 mg Oral QHS  . metoprolol succinate  50 mg Oral Daily  . mometasone-formoterol  2 puff Inhalation BID  . multivitamin with minerals  1 tablet Oral Daily  . nystatin   Topical BID  . sodium chloride flush  3 mL Intravenous Q12H   Continuous Infusions: . sodium chloride 250 mL (03/30/20 1304)     LOS: 13 days    Time spent: 15 minutes    Tresa Moore, MD [facility] Pager [phone]  If 7PM-7AM, please contact night-coverage 04/11/2020, 12:45 PM

## 2020-04-12 ENCOUNTER — Encounter: Payer: Self-pay | Admitting: Internal Medicine

## 2020-04-12 DIAGNOSIS — G9341 Metabolic encephalopathy: Secondary | ICD-10-CM | POA: Diagnosis not present

## 2020-04-12 DIAGNOSIS — M25562 Pain in left knee: Secondary | ICD-10-CM | POA: Diagnosis not present

## 2020-04-12 DIAGNOSIS — N390 Urinary tract infection, site not specified: Secondary | ICD-10-CM | POA: Diagnosis not present

## 2020-04-12 LAB — GLUCOSE, CAPILLARY
Glucose-Capillary: 242 mg/dL — ABNORMAL HIGH (ref 70–99)
Glucose-Capillary: 179 mg/dL — ABNORMAL HIGH (ref 70–99)
Glucose-Capillary: 136 mg/dL — ABNORMAL HIGH (ref 70–99)
Glucose-Capillary: 233 mg/dL — ABNORMAL HIGH (ref 70–99)
Glucose-Capillary: 257 mg/dL — ABNORMAL HIGH (ref 70–99)

## 2020-04-12 LAB — RESP PANEL BY RT-PCR (FLU A&B, COVID) ARPGX2
Influenza A by PCR: NEGATIVE
Influenza B by PCR: NEGATIVE
SARS Coronavirus 2 by RT PCR: NEGATIVE

## 2020-04-12 LAB — BLOOD GAS, VENOUS
Acid-Base Excess: 1.8 mmol/L (ref 0.0–2.0)
Bicarbonate: 27.7 mmol/L (ref 20.0–28.0)
O2 Saturation: 37.1 %
Patient temperature: 37
pCO2, Ven: 49 mmHg (ref 44.0–60.0)
pH, Ven: 7.36 (ref 7.250–7.430)

## 2020-04-12 LAB — CREATININE, SERUM
Creatinine, Ser: 1.42 mg/dL — ABNORMAL HIGH (ref 0.44–1.00)
GFR, Estimated: 41 mL/min — ABNORMAL LOW (ref >60)

## 2020-04-12 MED ORDER — DICLOFENAC SODIUM 1 % TD GEL: 2.0000 g | Freq: Three times a day (TID) | TRANSDERMAL | Status: AC | PRN

## 2020-04-12 MED ORDER — AMLODIPINE BESYLATE 5 MG PO TABS: 5.0000 mg | ORAL_TABLET | Freq: Every day | ORAL | Status: AC

## 2020-04-12 MED ORDER — COMBIVENT RESPIMAT 20-100 MCG/ACT IN AERS: 1.0000 | INHALATION_SPRAY | Freq: Four times a day (QID) | RESPIRATORY_TRACT | Status: AC | PRN

## 2020-04-12 MED ORDER — BASAGLAR KWIKPEN 100 UNIT/ML ~~LOC~~ SOPN: PEN_INJECTOR | SUBCUTANEOUS | Status: AC

## 2020-04-12 MED ORDER — MELATONIN 10 MG PO TABS: 10.0000 mg | ORAL_TABLET | Freq: Every day | ORAL | Status: AC

## 2020-04-12 MED ORDER — METOPROLOL SUCCINATE ER 25 MG PO TB24: 50.0000 mg | ORAL_TABLET | Freq: Every day | ORAL | Status: AC

## 2020-04-12 MED ORDER — ASPIRIN EC 81 MG PO TBEC: 81.0000 mg | DELAYED_RELEASE_TABLET | Freq: Every day | ORAL | Status: AC

## 2020-04-12 NOTE — Care Management Important Message (Signed)
Important Message  Patient Details  Name: Deanna Rice MRN: [id number] Date of Birth: October 06, 1955   Medicare Important Message Given:  Yes     [name] 04/12/2020, 10:33 AM

## 2020-04-12 NOTE — TOC Transition Note (Addendum)
Transition of Care [facility]) - CM/SW Discharge Note   Patient Details  Name: Deanna Rice MRN: [id number] Date of Birth: Apr 06, 1955  Transition of Care [facility]) CM/SW Contact:  Chapman Fitch, RN Phone Number: 04/12/2020, 2:50 PM   Clinical Narrative:     Per Zella Ball at [facility] patient can not return until she goes to rehab Bed offers presented.  Patient accepted bed at [facility]  Plan to discharge tomorrow   Sister updated per patients request  Repeat covid test ordered   Patient will be going under her Medicaid benefits   Final next level of care: Skilled Nursing Facility Barriers to Discharge: No Barriers Identified   Patient Goals and CMS Choice Patient states their goals for this hospitalization and ongoing recovery are:: Patient is okay with SNF palcement but would like placement in [place].      Discharge Placement              Patient chooses bed at: [facility] Patient to be transferred to facility by: EMS   Patient and family notified of of transfer: 04/12/20  Discharge Plan and Services In-house Referral: Clinical Social Work   Post Acute Care Choice: Skilled Nursing Facility                               Social Determinants of Health (SDOH) Interventions     Readmission Risk Interventions Readmission Risk Prevention Plan 03/31/2020  Transportation Screening Complete  Palliative Care Screening Not Applicable  Medication Review (RN Care Manager) Complete  Some recent data might be hidden

## 2020-04-12 NOTE — Discharge Summary (Signed)
Physician Discharge Summary  ALISON KUBICKI VUY:[id number] DOB: 05-05-1955 DOA: 03/29/2020  PCP: Deanna Guise, MD  Admit date: 03/29/2020 Discharge date: 04/12/2020  Discharge disposition: Skilled nursing facility   Recommendations for Outpatient Follow-Up:   Follow-up with physician at the nursing home within 3 days of discharge   Discharge Diagnosis:   Principal Problem:   Acute metabolic encephalopathy Active Problems:   COPD (chronic obstructive pulmonary disease) (HCC)   HTN (hypertension)   Primary hypothyroidism   Type 2 diabetes mellitus with stage 3 chronic kidney disease ([place])   Acute lower UTI    Discharge Condition: Stable.  Diet recommendation:  Diet Order            Diet - low sodium heart healthy           Diet Carb Modified Fluid consistency: Thin; Room service appropriate? Yes  Diet effective now                   Code Status: DNR     Hospital Course:   [name] is a 64 year old woman with medical history significant for morbid obesity-BMI of 60, type 2 diabetes mellitus, COPD/chronic respiratory failure on 4 L home O2, chronic venous insufficiency, hypothyroidism, chronic diastolic CHF, chronic osteoarthritis, depression who was brought to the ER after she had a fall at her assisted living facility.  She resides at a local assisted living facility and usually ambulates only veryshort distances with an assistive device, otherwise uses a wheelchair.  She has been seen in the ER multiple times for recurrent falls.  While in the emergency room she was noted to have visual hallucinations. Work-up in the emergency room was relatively unrevealing except for abnormal urinalysis, ABG ruled out hypercarbia, no leukocytosis, CT head without acute findings, chest x-ray noted mild pulmonary venous congestion and basilar atelectasis.  She also had an MRI of her left knee which showed severe degenerative changes.  MRI of the brain did not show any acute  abnormality.  She was treated with antibiotics for presumed UTI complicated by acute metabolic encephalopathy.  Her condition has improved and mental status has returned to baseline.  She was evaluated by PT and OT recommended discharge to SNF.  Her condition has improved and she is deemed stable for discharge today.    Discharge Exam:    Vitals:   04/11/20 2121 04/12/20 0412 04/12/20 0942 04/12/20 1141  BP: (!) 151/80 (!) 151/71 (!) 145/74 133/71  Pulse: 85 77 78 83  Resp: '18 19 18 18  ' Temp: 98 F (36.7 C) 98.2 F (36.8 C) 97.6 F (36.4 C) 98.8 F (37.1 C)  TempSrc: Oral Oral Oral   SpO2: 99% 98% 99% 98%  Weight:      Height:         GEN: NAD SKIN: Chronic superficial wound on the abdomen below the umbilicus.  She has multiple chronic healing wounds on the left leg.  She also has multiple chronic healing wounds on dorsal aspect of bilateral toes. EYES: EOMI ENT: MMM CV: RRR PULM: CTA B ABD: soft, obese, NT, +BS CNS: AAO x 3, non focal EXT: No edema or tenderness    Jonelle Sidle, RN, was present at the bedside during this encounter   The results of significant diagnostics from this hospitalization (including imaging, microbiology, ancillary and laboratory) are listed below for reference.     Procedures and Diagnostic Studies:   CT Angio Head W or Wo Contrast  Result Date: 03/29/2020  CLINICAL DATA:  Dizziness. EXAM: CT ANGIOGRAPHY HEAD AND NECK TECHNIQUE: Multidetector CT imaging of the head and neck was performed using the standard protocol during bolus administration of intravenous contrast. Multiplanar CT image reconstructions and MIPs were obtained to evaluate the vascular anatomy. Carotid stenosis measurements (when applicable) are obtained utilizing NASCET criteria, using the distal internal carotid diameter as the denominator. CONTRAST:  48m OMNIPAQUE IOHEXOL 350 MG/ML SOLN COMPARISON:  MRI of the brain March 29, 2020. FINDINGS: CTA NECK FINDINGS Aortic arch:  Standard branching. Imaged portion shows no evidence of aneurysm or dissection. Minimal atherosclerotic changes. No significant stenosis of the major arch vessel origins. Right carotid system: Atherosclerotic changes of the right carotid bifurcation with complete occlusion of the right ICA at the origin. Left carotid system: Atherosclerotic changes of the left carotid bifurcation with complete occlusion of the left ICA at the origin. Vertebral arteries: Mild atherosclerotic changes at the origin of the bilateral vertebral arteries without hemodynamically significant stenosis. The vertebral arteries are codominant and have normal course and caliber throughout the neck. Skeleton: No acute findings. Other neck: 14 mm hypodense right thyroid lobe nodule. Upper chest: Negative. Review of the MIP images confirms the above findings CTA HEAD FINDINGS Anterior circulation: Reconstitution of the flow to the right ICA at the communicating segment from a prominent right posterior communicating artery. The right ACA and MCA vascular trees are maintained. Reconstitution of flow to the left ICA with diminutive caliber at the communicating segment. Small left posterior communicating artery noted. The left MCA vascular tree is also of diminished caliber. The left A1/ACA segment has small caliber which may be related to hypoplasia versus decreased inflow. The left ACA vascular tree is maintained, likely related to flow via anterior communicating artery. Posterior circulation: Calcified plaques are seen in the bilateral V4 segments without hemodynamically significant stenosis. The basilar artery and bilateral posterior cerebral arteries are maintained. Venous sinuses: As permitted by contrast timing, patent. Review of the MIP images confirms the above findings IMPRESSION: 1. 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 2. Reconstitution of flow to the right ICA at the communicating segment from a prominent  right posterior communicating artery. 3. Reconstitution of flow to the left ICA, with diminutive caliber, at the communicating segment from a small left posterior communicating artery. 4. Small caliber left A1/ACA segment and left MCA vascular tree, which may be related to hypoplasia versus decreased inflow. Left ACA vascular tree is maintained, likely related to flow via anterior communicating artery. 5. 14 mm hypodense right thyroid lobe nodule. These results were called by telephone at the time of interpretation on 03/29/2020 at 2:04 pm to provider CDuffy Bruce, who verbally acknowledged these results. Electronically Signed   By: KPedro EarlsM.D.   On: 03/29/2020 14:04   CT Head Wo Contrast  Result Date: 03/29/2020 CLINICAL DATA:  Minor head trauma.  Fall. EXAM: CT HEAD WITHOUT CONTRAST TECHNIQUE: Contiguous axial images were obtained from the base of the skull through the vertex without intravenous contrast. COMPARISON:  02/26/2019 FINDINGS: Brain: No evidence of acute infarction, hemorrhage, hydrocephalus, extra-axial collection or mass lesion/mass effect. Moderate remote infarct in the superior left frontal lobe, non progressed. Vascular: No hyperdense vessel or unexpected calcification. Skull: Normal. Negative for fracture or focal lesion. Sinuses/Orbits: No acute finding. IMPRESSION: 1. No evidence of intracranial injury. 2. Remote left frontal infarct. Electronically Signed   By: JMonte FantasiaM.D.   On: 03/29/2020 07:46   CT Angio Neck  W and/or Wo Contrast  Result Date: 03/29/2020 CLINICAL DATA:  Dizziness. EXAM: CT ANGIOGRAPHY HEAD AND NECK TECHNIQUE: Multidetector CT imaging of the head and neck was performed using the standard protocol during bolus administration of intravenous contrast. Multiplanar CT image reconstructions and MIPs were obtained to evaluate the vascular anatomy. Carotid stenosis measurements (when applicable) are obtained utilizing NASCET criteria, using the  distal internal carotid diameter as the denominator. CONTRAST:  22m OMNIPAQUE IOHEXOL 350 MG/ML SOLN COMPARISON:  MRI of the brain March 29, 2020. FINDINGS: CTA NECK FINDINGS Aortic arch: Standard branching. Imaged portion shows no evidence of aneurysm or dissection. Minimal atherosclerotic changes. No significant stenosis of the major arch vessel origins. Right carotid system: Atherosclerotic changes of the right carotid bifurcation with complete occlusion of the right ICA at the origin. Left carotid system: Atherosclerotic changes of the left carotid bifurcation with complete occlusion of the left ICA at the origin. Vertebral arteries: Mild atherosclerotic changes at the origin of the bilateral vertebral arteries without hemodynamically significant stenosis. The vertebral arteries are codominant and have normal course and caliber throughout the neck. Skeleton: No acute findings. Other neck: 14 mm hypodense right thyroid lobe nodule. Upper chest: Negative. Review of the MIP images confirms the above findings CTA HEAD FINDINGS Anterior circulation: Reconstitution of the flow to the right ICA at the communicating segment from a prominent right posterior communicating artery. The right ACA and MCA vascular trees are maintained. Reconstitution of flow to the left ICA with diminutive caliber at the communicating segment. Small left posterior communicating artery noted. The left MCA vascular tree is also of diminished caliber. The left A1/ACA segment has small caliber which may be related to hypoplasia versus decreased inflow. The left ACA vascular tree is maintained, likely related to flow via anterior communicating artery. Posterior circulation: Calcified plaques are seen in the bilateral V4 segments without hemodynamically significant stenosis. The basilar artery and bilateral posterior cerebral arteries are maintained. Venous sinuses: As permitted by contrast timing, patent. Review of the MIP images confirms the  above findings IMPRESSION: 1. 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 2. Reconstitution of flow to the right ICA at the communicating segment from a prominent right posterior communicating artery. 3. Reconstitution of flow to the left ICA, with diminutive caliber, at the communicating segment from a small left posterior communicating artery. 4. Small caliber left A1/ACA segment and left MCA vascular tree, which may be related to hypoplasia versus decreased inflow. Left ACA vascular tree is maintained, likely related to flow via anterior communicating artery. 5. 14 mm hypodense right thyroid lobe nodule. These results were called by telephone at the time of interpretation on 03/29/2020 at 2:04 pm to provider CDuffy Bruce, who verbally acknowledged these results. Electronically Signed   By: KPedro EarlsM.D.   On: 03/29/2020 14:04   MR BRAIN WO CONTRAST  Result Date: 03/29/2020 CLINICAL DATA:  Delirium EXAM: MRI HEAD WITHOUT CONTRAST TECHNIQUE: Multiplanar, multiecho pulse sequences of the brain and surrounding structures were obtained without intravenous contrast. COMPARISON:  Head CT March 29, 2020 FINDINGS: Brain: No acute infarction, hemorrhage, hydrocephalus, extra-axial collection or mass lesion. Area of encephalomalacia and gliosis is seen in paramedian left frontal and parietal regions, suggestive of prior watershed infarcts. Scattered foci of T2 hyperintensity are seen within the white matter of the cerebral hemispheres and within the pons, nonspecific, most likely related to mild chronic microangiopathic changes. Vascular: Abnormal flow void in the bilateral carotid canals may represent  slow flow versus occlusion. Correlation MRA or CTA recommended. Skull and upper cervical spine: Normal marrow signal. Sinuses/Orbits: Negative. Other: Mild bilateral mastoid effusion. IMPRESSION: 1. No acute intracranial abnormality. 2. Abnormal flow void in the bilateral  carotid canals may represent slow flow versus occlusion. Correlation MRA or CTA recommended. 3. Encephalomalacia and gliosis in the paramedian left frontal and parietal regions, suggestive of prior watershed infarcts. 4. Mild chronic microangiopathic changes. Electronically Signed   By: Pedro Earls M.D.   On: 03/29/2020 11:08   DG Chest Portable 1 View  Result Date: 03/29/2020 CLINICAL DATA:  Recurrent falls. EXAM: PORTABLE CHEST 1 VIEW COMPARISON:  07/03/2019.  11/28/2014. FINDINGS: Cardiomegaly with mild pulmonary venous congestion. Low lung volumes with bibasilar atelectasis/scarring again noted. Pleural effusion pneumothorax no acute bony abnormality identified. IMPRESSION: 1. Cardiomegaly with mild pulmonary venous congestion. 2. Low lung volumes with bibasilar atelectasis/scarring again noted. Electronically Signed   By: Marcello Moores  Register   On: 03/29/2020 07:24   DG Knee Complete 4 Views Left  Result Date: 03/29/2020 CLINICAL DATA:  Recurrent falls.  Left knee pain. EXAM: LEFT KNEE - COMPLETE 4+ VIEW COMPARISON:  Left tib fib 12/29/2015. FINDINGS: Postsurgical changes noted the distal femur. Severe degenerative changes about the left knee joint again noted. Stable lateral subluxation of the tibia, most likely degenerative. No evidence of fracture or dislocation. Coarse dystrophic calcifications noted about the lower extremity. Peripheral vascular calcification noted. IMPRESSION: 1. No acute bony abnormality. 2. Postsurgical changes noted about the femur. Severe degenerative changes again noted about the left knee. Lateral subluxation of the tibia, stable from prior exam and most likely degenerative. 3. Peripheral vascular disease. Electronically Signed   By: Marcello Moores  Register   On: 03/29/2020 07:27     Labs:   Basic Metabolic Panel: Recent Labs  Lab 04/12/20 0520  CREATININE 1.42*   GFR Estimated Creatinine Clearance: 66.5 mL/min (A) (by C-G formula based on SCr of 1.42 mg/dL  (H)). Liver Function Tests: No results for input(s): AST, ALT, ALKPHOS, BILITOT, PROT, ALBUMIN in the last 168 hours. No results for input(s): LIPASE, AMYLASE in the last 168 hours. No results for input(s): AMMONIA in the last 168 hours. Coagulation profile No results for input(s): INR, PROTIME in the last 168 hours.  CBC: No results for input(s): WBC, NEUTROABS, HGB, HCT, MCV, PLT in the last 168 hours. Cardiac Enzymes: No results for input(s): CKTOTAL, CKMB, CKMBINDEX, TROPONINI in the last 168 hours. BNP: Invalid input(s): POCBNP CBG: Recent Labs  Lab 04/11/20 1636 04/11/20 2256 04/12/20 0119 04/12/20 0750 04/12/20 1141  GLUCAP 225* 283* 242* 179* 136*   D-Dimer No results for input(s): DDIMER in the last 72 hours. Hgb A1c No results for input(s): HGBA1C in the last 72 hours. Lipid Profile No results for input(s): CHOL, HDL, LDLCALC, TRIG, CHOLHDL, LDLDIRECT in the last 72 hours. Thyroid function studies No results for input(s): TSH, T4TOTAL, T3FREE, THYROIDAB in the last 72 hours.  Invalid input(s): FREET3 Anemia work up No results for input(s): VITAMINB12, FOLATE, FERRITIN, TIBC, IRON, RETICCTPCT in the last 72 hours. Microbiology No results found for this or any previous visit (from the past 240 hour(s)).   Discharge Instructions:   Discharge Instructions    Diet - low sodium heart healthy   Complete by: As directed    Discharge wound care:   Complete by: As directed    Keep wounds clean and dry.   Increase activity slowly   Complete by: As directed  Allergies as of 04/12/2020   No Known Allergies     Medication List    TAKE these medications   Advair Diskus 250-50 MCG/DOSE Aepb Generic drug: Fluticasone-Salmeterol INHALE 1 PUFF BY MOUTH INTO THE LUNGS 2 TIMES A DAY What changed: See the new instructions.   allopurinol 100 MG tablet Commonly known as: ZYLOPRIM TAKE 1 TABLET BY MOUTH DAILY   amLODipine 5 MG tablet Commonly known as:  NORVASC Take 1 tablet (5 mg total) by mouth daily. Start taking on: April 13, 2020   aspirin EC 81 MG tablet Take 1 tablet (81 mg total) by mouth daily.   Basaglar KwikPen 100 UNIT/ML Inject 18 units subcutaneously in the morning and 25 units subcutaneously at bedtime What changed: additional instructions   clopidogrel 75 MG tablet Commonly known as: PLAVIX TAKE 1 TABLET BY MOUTH DAILY   Combivent Respimat 20-100 MCG/ACT Aers respimat Generic drug: Ipratropium-Albuterol Inhale 1 puff into the lungs every 6 (six) hours as needed for wheezing or shortness of breath. What changed: See the new instructions.   diclofenac sodium 1 % Gel Commonly known as: VOLTAREN Apply 2 g topically 3 (three) times daily as needed (Pain in left leg). What changed:   when to take this  reasons to take this   DULoxetine 60 MG capsule Commonly known as: CYMBALTA TAKE ONE CAPSULE BY MOUTH TWICE A DAY   fluticasone 50 MCG/ACT nasal spray Commonly known as: FLONASE PLACE 2 SPRAYS INTO BOTH NOSTRILS DAILY   folic acid 1 MG tablet Commonly known as: FOLVITE TAKE 1 TABLET BY MOUTH DAILY   Global Ease Inject Pen Needles 32G X 4 MM Misc Generic drug: Insulin Pen Needle Use  As directed   isosorbide mononitrate 30 MG 24 hr tablet Commonly known as: IMDUR TAKE 1 TABLET BY MOUTH DAILY   loratadine 10 MG tablet Commonly known as: CLARITIN TAKE 1 TABLET BY MOUTH DAILY   Melatonin 10 MG Tabs Take 10 mg by mouth at bedtime. What changed: how much to take   metoprolol succinate 25 MG 24 hr tablet Commonly known as: TOPROL-XL Take 2 tablets (50 mg total) by mouth daily.   Microlet Lancets Misc by Does not apply route 4 (four) times daily. DX e11.65   multivitamin with minerals tablet Take 1 tablet by mouth daily.   nitroGLYCERIN 0.4 MG/SPRAY spray Commonly known as: NITROLINGUAL PLACE 1 SPRAY UNDER THE TONGUE EVERY 5 MINUTES FOR 3 DOSES AS NEEDED FOR CHEST PAIN.   NovoLOG FlexPen 100  UNIT/ML FlexPen Generic drug: insulin aspart Use as directed with sliding scale maximum 63 units no more What changed:   how to take this  additional instructions   nystatin powder Commonly known as: MYCOSTATIN/NYSTOP Apply 1 application topically 3 (three) times daily.   omeprazole 40 MG capsule Commonly known as: PRILOSEC TAKE 1 CAPSULE BY MOUTH DAILY   ONE TOUCH ULTRA 2 w/Device Kit by Does not apply route. Use as directed DX e11.65   OneTouch Ultra test strip Generic drug: glucose blood 1 each by Other route 4 (four) times daily. DX e11.65   OXYGEN Inhale into the lungs. Inhale 4 liters into lungs continously   silver sulfADIAZINE 1 % cream Commonly known as: SILVADENE APPLY 1 APPLICATION TOPICALLY DAILY   simvastatin 80 MG tablet Commonly known as: ZOCOR Take 80 mg by mouth daily.   Victoza 18 MG/3ML Sopn Generic drug: liraglutide INJECT 1.8 MG EVERY MORNING            Discharge  Care Instructions  (From admission, onward)         Start     Ordered   04/12/20 0000  Discharge wound care:       Comments: Keep wounds clean and dry.   04/12/20 1516          Contact information for after-discharge care    [facility] .   Service: Skilled Nursing Contact information: [place] [phone]                   Time coordinating discharge: 32 minutes  Signed:  Jennye Boroughs  [facility] 04/12/2020, 3:16 PM   Pager on [email or web address]. If 7PM-7AM, please contact night-coverage at [email or web address]

## 2020-04-13 DIAGNOSIS — G9341 Metabolic encephalopathy: Secondary | ICD-10-CM | POA: Diagnosis not present

## 2020-04-13 DIAGNOSIS — N390 Urinary tract infection, site not specified: Secondary | ICD-10-CM | POA: Diagnosis not present

## 2020-04-13 DIAGNOSIS — M25562 Pain in left knee: Secondary | ICD-10-CM | POA: Diagnosis not present

## 2020-04-13 LAB — GLUCOSE, CAPILLARY
Glucose-Capillary: 134 mg/dL — ABNORMAL HIGH (ref 70–99)
Glucose-Capillary: 122 mg/dL — ABNORMAL HIGH (ref 70–99)

## 2020-04-13 NOTE — TOC Transition Note (Signed)
Transition of Care [facility]) - CM/SW Discharge Note   Patient Details  Name: Deanna Rice MRN: [id number] Date of Birth: [birth year]  [facility] CM/SW Contact:  Margarito Liner, LCSW Phone Number: 04/13/2020, 12:44 PM   Clinical Narrative: Patient has orders to discharge to [facility] today. RN will call report to [phone]. EMS transport has been set up for 2:00. No further concerns. CSW signing off.    Final next level of care: Skilled Nursing Facility Barriers to Discharge: Barriers Resolved   Patient Goals and CMS Choice Patient states their goals for this hospitalization and ongoing recovery are:: Patient is okay with SNF palcement but would like placement in [place].   Choice offered to / list presented to : Patient  Discharge Placement PASRR number recieved: 04/05/20            Patient chooses bed at: [facility] Patient to be transferred to facility by: EMS Name of family member notified: Patient said she would call her sister Patient and family notified of of transfer: 04/13/20  Discharge Plan and Services In-house Referral: Clinical Social Work   Post Acute Care Choice: Skilled Nursing Facility                               Social Determinants of Health (SDOH) Interventions     Readmission Risk Interventions Readmission Risk Prevention Plan 03/31/2020  Transportation Screening Complete  Palliative Care Screening Not Applicable  Medication Review (RN Care Manager) Complete  Some recent data might be hidden

## 2020-04-13 NOTE — Progress Notes (Signed)
No complaints. Vital signs are okay. She's stable for discharge. This is a non billable encounter

## 2020-04-13 NOTE — Care Management Important Message (Signed)
Important Message  Patient Details  Name: Deanna Rice MRN: [id number] Date of Birth: [birth year]   Medicare Important Message Given:  Yes     [name] 04/13/2020, 1:07 PM

## 2020-04-13 NOTE — Discharge Summary (Signed)
Physician Discharge Summary  Deanna Rice EQA:[id number] DOB: [birth year] DOA: 03/29/2020  PCP: Lavera Guise, MD  Admit date: 03/29/2020 Discharge date: 04/13/2020  Discharge disposition: Skilled nursing facility   Recommendations for Outpatient Follow-Up:   Follow-up with physician at the nursing home within 3 days of discharge    Discharge Diagnosis:   Principal Problem:   Acute metabolic encephalopathy Active Problems:   COPD (chronic obstructive pulmonary disease) (HCC)   HTN (hypertension)   Primary hypothyroidism   Type 2 diabetes mellitus with stage 3 chronic kidney disease ([place])   Acute lower UTI    Discharge Condition: Stable.  Diet recommendation:  Diet Order            Diet - low sodium heart healthy           Diet Carb Modified Fluid consistency: Thin; Room service appropriate? Yes  Diet effective now                   Code Status: DNR     Hospital Course:    Deanna Rice is a 65 year old woman with medical history significant for morbid obesity-BMI of 60, type 2 diabetes mellitus, COPD/chronic respiratory failure on 4 L home O2, chronic venous insufficiency, hypothyroidism, chronic diastolic CHF, chronic osteoarthritis, depression who was brought to the ER after she had a fall at her assisted living facility.  She resides at a local assisted living facility and usually ambulates only veryshort distances with an assistive device, otherwise uses a wheelchair.  She has been seen in the ER multiple times for recurrent falls.  While in the emergency room she was noted to have visual hallucinations. Work-up in the emergency room was relatively unrevealing except for abnormal urinalysis, ABG ruled out hypercarbia, no leukocytosis, CT head without acute findings, chest x-ray noted mild pulmonary venous congestion and basilar atelectasis.  She also had an MRI of her left knee which showed severe degenerative changes.  MRI of the brain did not show any  acute abnormality.  She was treated with antibiotics for presumed UTI complicated by acute metabolic encephalopathy.  Her condition has improved and mental status has returned to baseline.  She was evaluated by PT and OT recommended discharge to SNF.  Her condition has improved and she is deemed stable for discharge today.    Discharge Exam:    Vitals:   04/12/20 1936 04/13/20 0031 04/13/20 0337 04/13/20 0742  BP: 131/74  135/73 (!) 141/96  Pulse:  79 75 75  Resp: '18 20 20 20  ' Temp: 97.9 F (36.6 C) 98.2 F (36.8 C) 98.2 F (36.8 C) 97.6 F (36.4 C)  TempSrc: Oral Oral Oral Oral  SpO2: 95% 98% 99% 100%  Weight:      Height:          The results of significant diagnostics from this hospitalization (including imaging, microbiology, ancillary and laboratory) are listed below for reference.     Procedures and Diagnostic Studies:   CT Angio Head W or Wo Contrast  Result Date: 03/29/2020 CLINICAL DATA:  Dizziness. EXAM: CT ANGIOGRAPHY HEAD AND NECK TECHNIQUE: Multidetector CT imaging of the head and neck was performed using the standard protocol during bolus administration of intravenous contrast. Multiplanar CT image reconstructions and MIPs were obtained to evaluate the vascular anatomy. Carotid stenosis measurements (when applicable) are obtained utilizing NASCET criteria, using the distal internal carotid diameter as the denominator. CONTRAST:  34m OMNIPAQUE IOHEXOL 350 MG/ML SOLN COMPARISON:  MRI of the  brain March 29, 2020. FINDINGS: CTA NECK FINDINGS Aortic arch: Standard branching. Imaged portion shows no evidence of aneurysm or dissection. Minimal atherosclerotic changes. No significant stenosis of the major arch vessel origins. Right carotid system: Atherosclerotic changes of the right carotid bifurcation with complete occlusion of the right ICA at the origin. Left carotid system: Atherosclerotic changes of the left carotid bifurcation with complete occlusion of the left  ICA at the origin. Vertebral arteries: Mild atherosclerotic changes at the origin of the bilateral vertebral arteries without hemodynamically significant stenosis. The vertebral arteries are codominant and have normal course and caliber throughout the neck. Skeleton: No acute findings. Other neck: 14 mm hypodense right thyroid lobe nodule. Upper chest: Negative. Review of the MIP images confirms the above findings CTA HEAD FINDINGS Anterior circulation: Reconstitution of the flow to the right ICA at the communicating segment from a prominent right posterior communicating artery. The right ACA and MCA vascular trees are maintained. Reconstitution of flow to the left ICA with diminutive caliber at the communicating segment. Small left posterior communicating artery noted. The left MCA vascular tree is also of diminished caliber. The left A1/ACA segment has small caliber which may be related to hypoplasia versus decreased inflow. The left ACA vascular tree is maintained, likely related to flow via anterior communicating artery. Posterior circulation: Calcified plaques are seen in the bilateral V4 segments without hemodynamically significant stenosis. The basilar artery and bilateral posterior cerebral arteries are maintained. Venous sinuses: As permitted by contrast timing, patent. Review of the MIP images confirms the above findings IMPRESSION: 1. 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 2. Reconstitution of flow to the right ICA at the communicating segment from a prominent right posterior communicating artery. 3. Reconstitution of flow to the left ICA, with diminutive caliber, at the communicating segment from a small left posterior communicating artery. 4. Small caliber left A1/ACA segment and left MCA vascular tree, which may be related to hypoplasia versus decreased inflow. Left ACA vascular tree is maintained, likely related to flow via anterior communicating artery. 5. 14 mm  hypodense right thyroid lobe nodule. These results were called by telephone at the time of interpretation on 03/29/2020 at 2:04 pm to provider Duffy Bruce , who verbally acknowledged these results. Electronically Signed   By: Pedro Earls M.D.   On: 03/29/2020 14:04   CT Head Wo Contrast  Result Date: 03/29/2020 CLINICAL DATA:  Minor head trauma.  Fall. EXAM: CT HEAD WITHOUT CONTRAST TECHNIQUE: Contiguous axial images were obtained from the base of the skull through the vertex without intravenous contrast. COMPARISON:  02/26/2019 FINDINGS: Brain: No evidence of acute infarction, hemorrhage, hydrocephalus, extra-axial collection or mass lesion/mass effect. Moderate remote infarct in the superior left frontal lobe, non progressed. Vascular: No hyperdense vessel or unexpected calcification. Skull: Normal. Negative for fracture or focal lesion. Sinuses/Orbits: No acute finding. IMPRESSION: 1. No evidence of intracranial injury. 2. Remote left frontal infarct. Electronically Signed   By: Monte Fantasia M.D.   On: 03/29/2020 07:46   CT Angio Neck W and/or Wo Contrast  Result Date: 03/29/2020 CLINICAL DATA:  Dizziness. EXAM: CT ANGIOGRAPHY HEAD AND NECK TECHNIQUE: Multidetector CT imaging of the head and neck was performed using the standard protocol during bolus administration of intravenous contrast. Multiplanar CT image reconstructions and MIPs were obtained to evaluate the vascular anatomy. Carotid stenosis measurements (when applicable) are obtained utilizing NASCET criteria, using the distal internal carotid diameter as the denominator. CONTRAST:  49m OMNIPAQUE IOHEXOL  350 MG/ML SOLN COMPARISON:  MRI of the brain March 29, 2020. FINDINGS: CTA NECK FINDINGS Aortic arch: Standard branching. Imaged portion shows no evidence of aneurysm or dissection. Minimal atherosclerotic changes. No significant stenosis of the major arch vessel origins. Right carotid system: Atherosclerotic changes of the  right carotid bifurcation with complete occlusion of the right ICA at the origin. Left carotid system: Atherosclerotic changes of the left carotid bifurcation with complete occlusion of the left ICA at the origin. Vertebral arteries: Mild atherosclerotic changes at the origin of the bilateral vertebral arteries without hemodynamically significant stenosis. The vertebral arteries are codominant and have normal course and caliber throughout the neck. Skeleton: No acute findings. Other neck: 14 mm hypodense right thyroid lobe nodule. Upper chest: Negative. Review of the MIP images confirms the above findings CTA HEAD FINDINGS Anterior circulation: Reconstitution of the flow to the right ICA at the communicating segment from a prominent right posterior communicating artery. The right ACA and MCA vascular trees are maintained. Reconstitution of flow to the left ICA with diminutive caliber at the communicating segment. Small left posterior communicating artery noted. The left MCA vascular tree is also of diminished caliber. The left A1/ACA segment has small caliber which may be related to hypoplasia versus decreased inflow. The left ACA vascular tree is maintained, likely related to flow via anterior communicating artery. Posterior circulation: Calcified plaques are seen in the bilateral V4 segments without hemodynamically significant stenosis. The basilar artery and bilateral posterior cerebral arteries are maintained. Venous sinuses: As permitted by contrast timing, patent. Review of the MIP images confirms the above findings IMPRESSION: 1. 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 2. Reconstitution of flow to the right ICA at the communicating segment from a prominent right posterior communicating artery. 3. Reconstitution of flow to the left ICA, with diminutive caliber, at the communicating segment from a small left posterior communicating artery. 4. Small caliber left A1/ACA segment and  left MCA vascular tree, which may be related to hypoplasia versus decreased inflow. Left ACA vascular tree is maintained, likely related to flow via anterior communicating artery. 5. 14 mm hypodense right thyroid lobe nodule. These results were called by telephone at the time of interpretation on 03/29/2020 at 2:04 pm to provider Duffy Bruce , who verbally acknowledged these results. Electronically Signed   By: Pedro Earls M.D.   On: 03/29/2020 14:04   MR BRAIN WO CONTRAST  Result Date: 03/29/2020 CLINICAL DATA:  Delirium EXAM: MRI HEAD WITHOUT CONTRAST TECHNIQUE: Multiplanar, multiecho pulse sequences of the brain and surrounding structures were obtained without intravenous contrast. COMPARISON:  Head CT March 29, 2020 FINDINGS: Brain: No acute infarction, hemorrhage, hydrocephalus, extra-axial collection or mass lesion. Area of encephalomalacia and gliosis is seen in paramedian left frontal and parietal regions, suggestive of prior watershed infarcts. Scattered foci of T2 hyperintensity are seen within the white matter of the cerebral hemispheres and within the pons, nonspecific, most likely related to mild chronic microangiopathic changes. Vascular: Abnormal flow void in the bilateral carotid canals may represent slow flow versus occlusion. Correlation MRA or CTA recommended. Skull and upper cervical spine: Normal marrow signal. Sinuses/Orbits: Negative. Other: Mild bilateral mastoid effusion. IMPRESSION: 1. No acute intracranial abnormality. 2. Abnormal flow void in the bilateral carotid canals may represent slow flow versus occlusion. Correlation MRA or CTA recommended. 3. Encephalomalacia and gliosis in the paramedian left frontal and parietal regions, suggestive of prior watershed infarcts. 4. Mild chronic microangiopathic changes. Electronically Signed  By: Pedro Earls M.D.   On: 03/29/2020 11:08   DG Chest Portable 1 View  Result Date: 03/29/2020 CLINICAL DATA:   Recurrent falls. EXAM: PORTABLE CHEST 1 VIEW COMPARISON:  07/03/2019.  11/28/2014. FINDINGS: Cardiomegaly with mild pulmonary venous congestion. Low lung volumes with bibasilar atelectasis/scarring again noted. Pleural effusion pneumothorax no acute bony abnormality identified. IMPRESSION: 1. Cardiomegaly with mild pulmonary venous congestion. 2. Low lung volumes with bibasilar atelectasis/scarring again noted. Electronically Signed   By: Marcello Moores  Register   On: 03/29/2020 07:24   DG Knee Complete 4 Views Left  Result Date: 03/29/2020 CLINICAL DATA:  Recurrent falls.  Left knee pain. EXAM: LEFT KNEE - COMPLETE 4+ VIEW COMPARISON:  Left tib fib 12/29/2015. FINDINGS: Postsurgical changes noted the distal femur. Severe degenerative changes about the left knee joint again noted. Stable lateral subluxation of the tibia, most likely degenerative. No evidence of fracture or dislocation. Coarse dystrophic calcifications noted about the lower extremity. Peripheral vascular calcification noted. IMPRESSION: 1. No acute bony abnormality. 2. Postsurgical changes noted about the femur. Severe degenerative changes again noted about the left knee. Lateral subluxation of the tibia, stable from prior exam and most likely degenerative. 3. Peripheral vascular disease. Electronically Signed   By: Marcello Moores  Register   On: 03/29/2020 07:27     Labs:   Basic Metabolic Panel: Recent Labs  Lab 04/12/20 0520  CREATININE 1.42*   GFR Estimated Creatinine Clearance: 66.5 mL/min (A) (by C-G formula based on SCr of 1.42 mg/dL (H)). Liver Function Tests: No results for input(s): AST, ALT, ALKPHOS, BILITOT, PROT, ALBUMIN in the last 168 hours. No results for input(s): LIPASE, AMYLASE in the last 168 hours. No results for input(s): AMMONIA in the last 168 hours. Coagulation profile No results for input(s): INR, PROTIME in the last 168 hours.  CBC: No results for input(s): WBC, NEUTROABS, HGB, HCT, MCV, PLT in the last 168  hours. Cardiac Enzymes: No results for input(s): CKTOTAL, CKMB, CKMBINDEX, TROPONINI in the last 168 hours. BNP: Invalid input(s): POCBNP CBG: Recent Labs  Lab 04/12/20 0750 04/12/20 1141 04/12/20 1648 04/12/20 2034 04/13/20 0751  GLUCAP 179* 136* 233* 257* 134*   D-Dimer No results for input(s): DDIMER in the last 72 hours. Hgb A1c No results for input(s): HGBA1C in the last 72 hours. Lipid Profile No results for input(s): CHOL, HDL, LDLCALC, TRIG, CHOLHDL, LDLDIRECT in the last 72 hours. Thyroid function studies No results for input(s): TSH, T4TOTAL, T3FREE, THYROIDAB in the last 72 hours.  Invalid input(s): FREET3 Anemia work up No results for input(s): VITAMINB12, FOLATE, FERRITIN, TIBC, IRON, RETICCTPCT in the last 72 hours. Microbiology Recent Results (from the past 240 hour(s))  Resp Panel by RT-PCR (Flu A&B, Covid) Nasopharyngeal Swab     Status: None   Collection Time: 04/12/20  2:37 PM   Specimen: Nasopharyngeal Swab; Nasopharyngeal(NP) swabs in vial transport medium  Result Value Ref Range Status   SARS Coronavirus 2 by RT PCR NEGATIVE NEGATIVE Final    Comment: (NOTE) SARS-CoV-2 target nucleic acids are NOT DETECTED.  The SARS-CoV-2 RNA is generally detectable in upper respiratory specimens during the acute phase of infection. The lowest concentration of SARS-CoV-2 viral copies this assay can detect is 138 copies/mL. A negative result does not preclude SARS-Cov-2 infection and should not be used as the sole basis for treatment or other patient management decisions. A negative result may occur with  improper specimen collection/handling, submission of specimen other than nasopharyngeal swab, presence of viral mutation(s) within  the areas targeted by this assay, and inadequate number of viral copies(<138 copies/mL). A negative result must be combined with clinical observations, patient history, and epidemiological information. The expected result is  Negative.  Fact Sheet for Patients:  EntrepreneurPulse.com.au  Fact Sheet for Healthcare Providers:  IncredibleEmployment.be  This test is no t yet approved or cleared by the [place] FDA and  has been authorized for detection and/or diagnosis of SARS-CoV-2 by FDA under an Emergency Use Authorization (EUA). This EUA will remain  in effect (meaning this test can be used) for the duration of the COVID-19 declaration under Section 564(b)(1) of the Act, 21 U.S.C.section 360bbb-3(b)(1), unless the authorization is terminated  or revoked sooner.       Influenza A by PCR NEGATIVE NEGATIVE Final   Influenza B by PCR NEGATIVE NEGATIVE Final    Comment: (NOTE) The Xpert Xpress SARS-CoV-2/FLU/RSV plus assay is intended as an aid in the diagnosis of influenza from Nasopharyngeal swab specimens and should not be used as a sole basis for treatment. Nasal washings and aspirates are unacceptable for Xpert Xpress SARS-CoV-2/FLU/RSV testing.  Fact Sheet for Patients: EntrepreneurPulse.com.au  Fact Sheet for Healthcare Providers: IncredibleEmployment.be  This test is not yet approved or cleared by the [place] FDA and has been authorized for detection and/or diagnosis of SARS-CoV-2 by FDA under an Emergency Use Authorization (EUA). This EUA will remain in effect (meaning this test can be used) for the duration of the COVID-19 declaration under Section 564(b)(1) of the Act, 21 U.S.C. section 360bbb-3(b)(1), unless the authorization is terminated or revoked.  Performed at [facility], [address] [place], [place] [address]      Discharge Instructions:   Discharge Instructions    Diet - low sodium heart healthy   Complete by: As directed    Discharge wound care:   Complete by: As directed    Keep wounds clean and dry.   Increase activity slowly   Complete by: As directed      Allergies  as of 04/13/2020   No Known Allergies     Medication List    TAKE these medications   Advair Diskus 250-50 MCG/DOSE Aepb Generic drug: Fluticasone-Salmeterol INHALE 1 PUFF BY MOUTH INTO THE LUNGS 2 TIMES A DAY What changed: See the new instructions.   allopurinol 100 MG tablet Commonly known as: ZYLOPRIM TAKE 1 TABLET BY MOUTH DAILY   amLODipine 5 MG tablet Commonly known as: NORVASC Take 1 tablet (5 mg total) by mouth daily.   aspirin EC 81 MG tablet Take 1 tablet (81 mg total) by mouth daily.   Basaglar KwikPen 100 UNIT/ML Inject 18 units subcutaneously in the morning and 25 units subcutaneously at bedtime What changed: additional instructions   clopidogrel 75 MG tablet Commonly known as: PLAVIX TAKE 1 TABLET BY MOUTH DAILY   Combivent Respimat 20-100 MCG/ACT Aers respimat Generic drug: Ipratropium-Albuterol Inhale 1 puff into the lungs every 6 (six) hours as needed for wheezing or shortness of breath. What changed: See the new instructions.   diclofenac sodium 1 % Gel Commonly known as: VOLTAREN Apply 2 g topically 3 (three) times daily as needed (Pain in left leg). What changed:   when to take this  reasons to take this   DULoxetine 60 MG capsule Commonly known as: CYMBALTA TAKE ONE CAPSULE BY MOUTH TWICE A DAY   fluticasone 50 MCG/ACT nasal spray Commonly known as: FLONASE PLACE 2 SPRAYS INTO BOTH NOSTRILS DAILY   folic acid 1 MG  tablet Commonly known as: FOLVITE TAKE 1 TABLET BY MOUTH DAILY   Global Ease Inject Pen Needles 32G X 4 MM Misc Generic drug: Insulin Pen Needle Use  As directed   isosorbide mononitrate 30 MG 24 hr tablet Commonly known as: IMDUR TAKE 1 TABLET BY MOUTH DAILY   loratadine 10 MG tablet Commonly known as: CLARITIN TAKE 1 TABLET BY MOUTH DAILY   Melatonin 10 MG Tabs Take 10 mg by mouth at bedtime. What changed: how much to take   metoprolol succinate 25 MG 24 hr tablet Commonly known as: TOPROL-XL Take 2 tablets (50  mg total) by mouth daily.   Microlet Lancets Misc by Does not apply route 4 (four) times daily. DX e11.65   multivitamin with minerals tablet Take 1 tablet by mouth daily.   nitroGLYCERIN 0.4 MG/SPRAY spray Commonly known as: NITROLINGUAL PLACE 1 SPRAY UNDER THE TONGUE EVERY 5 MINUTES FOR 3 DOSES AS NEEDED FOR CHEST PAIN.   NovoLOG FlexPen 100 UNIT/ML FlexPen Generic drug: insulin aspart Use as directed with sliding scale maximum 63 units no more What changed:   how to take this  additional instructions   nystatin powder Commonly known as: MYCOSTATIN/NYSTOP Apply 1 application topically 3 (three) times daily.   omeprazole 40 MG capsule Commonly known as: PRILOSEC TAKE 1 CAPSULE BY MOUTH DAILY   ONE TOUCH ULTRA 2 w/Device Kit by Does not apply route. Use as directed DX e11.65   OneTouch Ultra test strip Generic drug: glucose blood 1 each by Other route 4 (four) times daily. DX e11.65   OXYGEN Inhale into the lungs. Inhale 4 liters into lungs continously   silver sulfADIAZINE 1 % cream Commonly known as: SILVADENE APPLY 1 APPLICATION TOPICALLY DAILY   simvastatin 80 MG tablet Commonly known as: ZOCOR Take 80 mg by mouth daily.   Victoza 18 MG/3ML Sopn Generic drug: liraglutide INJECT 1.8 MG EVERY MORNING            Discharge Care Instructions  (From admission, onward)         Start     Ordered   04/12/20 0000  Discharge wound care:       Comments: Keep wounds clean and dry.   04/12/20 1516          Contact information for after-discharge care    [facility] .   Service: Skilled Nursing Contact information: [place] [phone]                   Time coordinating discharge: 25 minutes  Signed:  Jennye Boroughs  [facility] 04/13/2020, 10:31 AM   Pager on [email or web address]. If 7PM-7AM, please contact night-coverage at [email or web address]

## 2020-04-13 NOTE — Progress Notes (Signed)
PT Cancellation Note  Patient Details Name: Deanna Rice MRN: [id number] DOB: Feb 10, 1956   Cancelled Treatment:     Therapist in to see pt this am.  Pt declined stating she was awaiting d/c to SNF shortly.  Pt states she has been ambulating in room with RW without difficulty.  Pt to continue PT services at SNF.   Jannet Askew 04/13/2020, 10:10 AM

## 2020-04-13 NOTE — Plan of Care (Signed)
Discharge order received. Patient mental status is at baseline. Vital signs stable . No signs of acute distress. Discharge instructions given to Deanna Rice at [facility] .No other issues noted at this time.

## 2020-05-19 ENCOUNTER — Emergency Department
Admission: EM | Admit: 2020-05-19 | Discharge: 2020-05-19 | Disposition: A | Discharge status: Home / Self Care | Payer: Medicare Other | Attending: Emergency Medicine | Admitting: Emergency Medicine

## 2020-05-19 ENCOUNTER — Emergency Department: Payer: Medicare Other

## 2020-05-19 ENCOUNTER — Other Ambulatory Visit: Payer: Self-pay

## 2020-05-19 DIAGNOSIS — Z794 Long term (current) use of insulin: Secondary | ICD-10-CM | POA: Diagnosis not present

## 2020-05-19 DIAGNOSIS — J449 Chronic obstructive pulmonary disease, unspecified: Secondary | ICD-10-CM | POA: Insufficient documentation

## 2020-05-19 DIAGNOSIS — Z7982 Long term (current) use of aspirin: Secondary | ICD-10-CM | POA: Insufficient documentation

## 2020-05-19 DIAGNOSIS — I509 Heart failure, unspecified: Secondary | ICD-10-CM | POA: Insufficient documentation

## 2020-05-19 DIAGNOSIS — Z87891 Personal history of nicotine dependence: Secondary | ICD-10-CM | POA: Diagnosis not present

## 2020-05-19 DIAGNOSIS — E1122 Type 2 diabetes mellitus with diabetic chronic kidney disease: Secondary | ICD-10-CM | POA: Insufficient documentation

## 2020-05-19 DIAGNOSIS — R519 Headache, unspecified: Secondary | ICD-10-CM | POA: Diagnosis not present

## 2020-05-19 DIAGNOSIS — Z7952 Long term (current) use of systemic steroids: Secondary | ICD-10-CM | POA: Diagnosis not present

## 2020-05-19 DIAGNOSIS — E039 Hypothyroidism, unspecified: Secondary | ICD-10-CM | POA: Diagnosis not present

## 2020-05-19 DIAGNOSIS — R0789 Other chest pain: Secondary | ICD-10-CM

## 2020-05-19 DIAGNOSIS — Z79899 Other long term (current) drug therapy: Secondary | ICD-10-CM | POA: Insufficient documentation

## 2020-05-19 DIAGNOSIS — N183 Chronic kidney disease, stage 3 unspecified: Secondary | ICD-10-CM | POA: Diagnosis not present

## 2020-05-19 DIAGNOSIS — I130 Hypertensive heart and chronic kidney disease with heart failure and stage 1 through stage 4 chronic kidney disease, or unspecified chronic kidney disease: Secondary | ICD-10-CM | POA: Insufficient documentation

## 2020-05-19 LAB — COMPREHENSIVE METABOLIC PANEL
ALT: 18 U/L (ref 0–44)
AST: 21 U/L (ref 15–41)
Albumin: 3.1 g/dL — ABNORMAL LOW (ref 3.5–5.0)
Alkaline Phosphatase: 83 U/L (ref 38–126)
Anion gap: 8 (ref 5–15)
BUN: 30 mg/dL — ABNORMAL HIGH (ref 8–23)
CO2: 28 mmol/L (ref 22–32)
Calcium: 8 mg/dL — ABNORMAL LOW (ref 8.9–10.3)
Chloride: 99 mmol/L (ref 98–111)
Creatinine, Ser: 1.68 mg/dL — ABNORMAL HIGH (ref 0.44–1.00)
GFR, Estimated: 34 mL/min — ABNORMAL LOW (ref >60)
Glucose, Bld: 204 mg/dL — ABNORMAL HIGH (ref 70–99)
Potassium: 3.7 mmol/L (ref 3.5–5.1)
Sodium: 135 mmol/L (ref 135–145)
Total Bilirubin: 0.5 mg/dL (ref 0.3–1.2)
Total Protein: 6.1 g/dL — ABNORMAL LOW (ref 6.5–8.1)

## 2020-05-19 LAB — CBC
HCT: 31.3 % — ABNORMAL LOW (ref 36.0–46.0)
Hemoglobin: 10.4 g/dL — ABNORMAL LOW (ref 12.0–15.0)
MCH: 29.2 pg (ref 26.0–34.0)
MCHC: 33.2 g/dL (ref 30.0–36.0)
MCV: 87.9 fL (ref 80.0–100.0)
Platelets: 150 10*3/uL (ref 150–400)
RBC: 3.56 MIL/uL — ABNORMAL LOW (ref 3.87–5.11)
RDW: 15.8 % — ABNORMAL HIGH (ref 11.5–15.5)
WBC: 8.4 10*3/uL (ref 4.0–10.5)
nRBC: 0 % (ref 0.0–0.2)

## 2020-05-19 LAB — TROPONIN I (HIGH SENSITIVITY)
Troponin I (High Sensitivity): 20 ng/L — ABNORMAL HIGH (ref <18)
Troponin I (High Sensitivity): 19 ng/L — ABNORMAL HIGH (ref <18)

## 2020-05-19 NOTE — ED Provider Notes (Signed)
[facility] Emergency Department Provider Note   ____________________________________________    I have reviewed the triage vital signs and the nursing notes.   HISTORY  Chief Complaint Headache and Chest Pain     HPI Deanna Rice is a 65 y.o. female with extensive past medical history as detailed below presents from facility for primary complaint of chest pain, she had noted headache to staff there but her complaint primarily to [place] is chest pain.  She reports it has been ongoing since this morning.  She states it is starting to feel better currently.  She describes it as a pressure-like sensation, she reports she has had this in the past.  She denies neuro deficits to me.  She reports headache has resolved.  Afebrile, no shortness of breath  Past Medical History:  Diagnosis Date  . Anemia, unspecified   . CHF (congestive heart failure) ([place])   . Chronic ischemic heart disease, unspecified   . COPD (chronic obstructive pulmonary disease) ([place])   . Dependence on supplemental oxygen   . Diabetes mellitus without complication ([employer])   . Gout, unspecified   . Hypertension   . Mixed hyperlipidemia   . Obstructive chronic bronchitis without exacerbation ([place])   . Obstructive sleep apnea syndrome   . Peripheral venous insufficiency   . Primary generalized hypertrophic osteoarthrosis   . Primary hypothyroidism   . Severe recurrent major depression with psychotic features [facility])     Patient Active Problem List   Diagnosis Date Noted  . Acute metabolic encephalopathy 27/25/3664  . Primary hypothyroidism   . Type 2 diabetes mellitus with stage 3 chronic kidney disease ([place])   . Acute lower UTI   . Chest pain 07/04/2019  . Furuncle of vulva 02/28/2019  . Cellulitis of left lower extremity 12/31/2018  . Diabetic ulcer of left lower leg associated with type 2 diabetes mellitus ([place]) 12/31/2018  . Uncontrolled type 2 diabetes mellitus with hyperglycemia  ([place]) 12/31/2018  . Chronic pain syndrome 08/03/2017  . Oxygen dependent 08/03/2017  . Thrombocytopenia ([place]) 09/28/2015  . Anemia in chronic kidney disease 09/28/2015  . Diarrhea 11/28/2014  . AKI (acute kidney injury) ([place]) 11/28/2014  . COPD (chronic obstructive pulmonary disease) ([place]) 11/28/2014  . Uncontrolled type 2 diabetes mellitus with hypoglycemia ([place]) 11/28/2014  . HTN (hypertension) 11/28/2014    Past Surgical History:  Procedure Laterality Date  . TONSILLECTOMY      Prior to Admission medications   Medication Sig Start Date End Date Taking? Authorizing Provider  ADVAIR DISKUS 250-50 MCG/DOSE AEPB INHALE 1 PUFF BY MOUTH INTO THE LUNGS 2 TIMES A DAY Patient taking differently: Inhale 1 puff into the lungs in the morning and at bedtime. 05/27/19   Kendell Bane, NP  allopurinol (ZYLOPRIM) 100 MG tablet TAKE 1 TABLET BY MOUTH DAILY 05/27/19   Kendell Bane, NP  amLODipine (NORVASC) 5 MG tablet Take 1 tablet (5 mg total) by mouth daily. 04/13/20   Jennye Boroughs, MD  aspirin EC 81 MG tablet Take 1 tablet (81 mg total) by mouth daily. 04/12/20   Jennye Boroughs, MD  Blood Glucose Monitoring Suppl (ONE TOUCH ULTRA 2) w/Device KIT by Does not apply route. Use as directed DX e11.65    [provider]  clopidogrel (PLAVIX) 75 MG tablet TAKE 1 TABLET BY MOUTH DAILY 05/27/19   Kendell Bane, NP  diclofenac sodium (VOLTAREN) 1 % GEL Apply 2 g topically 3 (three) times daily as needed (Pain in  left leg). 04/12/20   Jennye Boroughs, MD  DULoxetine (CYMBALTA) 60 MG capsule TAKE ONE CAPSULE BY MOUTH TWICE A DAY 05/27/19   Kendell Bane, NP  fluticasone (FLONASE) 50 MCG/ACT nasal spray PLACE 2 SPRAYS INTO BOTH NOSTRILS DAILY 05/27/19   Kendell Bane, NP  folic acid (FOLVITE) 1 MG tablet TAKE 1 TABLET BY MOUTH DAILY 05/27/19   Kendell Bane, NP  glucose blood (ONETOUCH ULTRA) test strip 1 each by Other route 4 (four) times daily. DX e11.65    [provider]  insulin  aspart (NOVOLOG FLEXPEN) 100 UNIT/ML FlexPen Use as directed with sliding scale maximum 63 units no more Patient taking differently: Inject into the skin. Use as directed with sliding scale 03/30/19   Kendell Bane, NP  Insulin Glargine [facility]) 100 UNIT/ML Inject 18 units subcutaneously in the morning and 25 units subcutaneously at bedtime 04/12/20   Jennye Boroughs, MD  Insulin Pen Needle (GLOBAL EASE INJECT PEN NEEDLES) 32G X 4 MM MISC Use  As directed 12/28/18   Ronnell Freshwater, NP  Ipratropium-Albuterol (COMBIVENT RESPIMAT) 20-100 MCG/ACT AERS respimat Inhale 1 puff into the lungs every 6 (six) hours as needed for wheezing or shortness of breath. 04/12/20   Jennye Boroughs, MD  isosorbide mononitrate (IMDUR) 30 MG 24 hr tablet TAKE 1 TABLET BY MOUTH DAILY 05/27/19   Kendell Bane, NP  loratadine (CLARITIN) 10 MG tablet TAKE 1 TABLET BY MOUTH DAILY 06/23/19   Kendell Bane, NP  Melatonin 10 MG TABS Take 10 mg by mouth at bedtime. 04/12/20   Jennye Boroughs, MD  metoprolol succinate (TOPROL-XL) 25 MG 24 hr tablet Take 2 tablets (50 mg total) by mouth daily. 04/12/20   Jennye Boroughs, MD  Microlet Lancets MISC by Does not apply route 4 (four) times daily. DX e11.65    [provider]  Multiple Vitamins-Minerals (MULTIVITAMIN WITH MINERALS) tablet Take 1 tablet by mouth daily.    [provider]  nitroGLYCERIN (NITROLINGUAL) 0.4 MG/SPRAY spray PLACE 1 SPRAY UNDER THE TONGUE EVERY 5 MINUTES FOR 3 DOSES AS NEEDED FOR CHEST PAIN. 06/19/18   Ronnell Freshwater, NP  nystatin (MYCOSTATIN/NYSTOP) powder Apply 1 application topically 3 (three) times daily. 03/21/20   Blake Divine, MD  omeprazole (PRILOSEC) 40 MG capsule TAKE 1 CAPSULE BY MOUTH DAILY 05/27/19   Kendell Bane, NP  OXYGEN Inhale into the lungs. Inhale 4 liters into lungs continously    [provider]  silver sulfADIAZINE (SILVADENE) 1 % cream APPLY 1 APPLICATION TOPICALLY DAILY 02/15/19   Ronnell Freshwater,  NP  simvastatin (ZOCOR) 80 MG tablet Take 80 mg by mouth daily.    [provider]  VICTOZA 18 MG/3ML SOPN INJECT 1.8 MG EVERY MORNING 05/27/19   Kendell Bane, NP     Allergies Patient has no known allergies.  Family History  Problem Relation Age of Onset  . Hypertension Mother   . Diabetes Mother   . CAD Mother   . CAD Father   . Diabetes Father   . Hypertension Father   . Hypertension Sister   . Diabetes Sister   . CAD Sister     Social History Social History   Tobacco Use  . Smoking status: Former Research scientist (life sciences)  . Smokeless tobacco: Never Used  Vaping Use  . Vaping Use: Never used  Substance Use Topics  . Alcohol use: No  . Drug use: No    Review of Systems  Constitutional: No fever/chills  Eyes: No visual changes.  ENT: No sore throat. Cardiovascular: As above Respiratory: Denies shortness of breath. Gastrointestinal: No abdominal pain.  No nausea, no vomiting.   Genitourinary: Negative for dysuria. Musculoskeletal: Negative for back pain. Skin: Negative for rash. Neurological: As above, no neuro deficits   ____________________________________________   PHYSICAL EXAM:  VITAL SIGNS: ED Triage Vitals  Enc Vitals Group     BP 05/19/20 1401 (!) 157/88     Pulse Rate 05/19/20 1401 100     Resp 05/19/20 1401 15     Temp 05/19/20 1401 98.2 F (36.8 C)     Temp Source 05/19/20 1401 Oral     SpO2 05/19/20 1401 99 %     Weight 05/19/20 1400 (!) 175 kg (385 lb 12.9 oz)     Height 05/19/20 1400 1.702 m ('5\' 7"' )     Head Circumference --      Peak Flow --      Pain Score 05/19/20 1357 4     Pain Loc --      Pain Edu? --      Excl. in [place]? --     Constitutional: Alert and oriented. No acute distress. Pleasant and interactive  Nose: No congestion/rhinnorhea. Mouth/Throat: Mucous membranes are moist.   Neck:  Painless ROM Cardiovascular: Normal rate, regular rhythm. Grossly normal heart sounds.  Good peripheral circulation. Respiratory: Normal  respiratory effort.  No retractions. Lungs CTAB. Gastrointestinal: Soft and nontender. No distention.  No CVA tenderness. Genitourinary: deferred Musculoskeletal: No lower extremity tenderness nor edema.  Warm and well perfused Neurologic:  Normal speech and language. No gross focal neurologic deficits are appreciated.  Cranial nerves II to XII are normal Skin:  Skin is warm, dry and intact. No rash noted. Psychiatric: Mood and affect are normal. Speech and behavior are normal.  ____________________________________________   LABS (all labs ordered are listed, but only abnormal results are displayed)  Labs Reviewed  CBC - Abnormal; Notable for the following components:      Result Value   RBC 3.56 (*)    Hemoglobin 10.4 (*)    HCT 31.3 (*)    RDW 15.8 (*)    All other components within normal limits  COMPREHENSIVE METABOLIC PANEL - Abnormal; Notable for the following components:   Glucose, Bld 204 (*)    BUN 30 (*)    Creatinine, Ser 1.68 (*)    Calcium 8.0 (*)    Total Protein 6.1 (*)    Albumin 3.1 (*)    GFR, Estimated 34 (*)    All other components within normal limits  TROPONIN I (HIGH SENSITIVITY) - Abnormal; Notable for the following components:   Troponin I (High Sensitivity) 20 (*)    All other components within normal limits  TROPONIN I (HIGH SENSITIVITY) - Abnormal; Notable for the following components:   Troponin I (High Sensitivity) 19 (*)    All other components within normal limits   ____________________________________________  EKG  ED ECG REPORT I, Lavonia Drafts, the attending physician, personally viewed and interpreted this ECG.  Date: 05/19/2020  Rhythm: normal sinus rhythm QRS Axis: normal Intervals: normal ST/T Wave abnormalities: normal Narrative Interpretation: no evidence of acute ischemia  ____________________________________________  RADIOLOGY  Chest x-ray viewed by me, no  abnormalities ____________________________________________   PROCEDURES  Procedure(s) performed:   .1-3 Lead EKG Interpretation Performed by: Lavonia Drafts, MD Authorized by: Lavonia Drafts, MD     Interpretation: normal     ECG rate assessment: tachycardic  Rhythm: sinus rhythm     Ectopy: none     Conduction: normal       Critical Care performed: No ____________________________________________   INITIAL IMPRESSION / ASSESSMENT AND PLAN / ED COURSE  Pertinent labs & imaging results that were available during my care of the patient were reviewed by me and considered in my medical decision making (see chart for details).  Patient presents with primary complaint of chest pain as described above, symptoms are already abating in the emergency department.  Initial EKG is overall reassuring, mild tachycardia.  We will send delta troponin, obtain chest x-ray, labs  Initial troponin is unchanged from prior, lab work is also unchanged from prior, mild elevation of BUN to creatinine ratio  Delta troponin unchanged from initial  Patient is chest pain-free.  Discussed with her results of chest x-ray, labs, reassuring troponins, she feels well and would like to go back to facility, no indication for admission at this time, return precautions discussed    ____________________________________________   FINAL CLINICAL IMPRESSION(S) / ED DIAGNOSES  Final diagnoses:  Atypical chest pain        Note:  This document was prepared using Dragon voice recognition software and may include unintentional dictation errors.   Lavonia Drafts, MD 05/19/20 2006

## 2020-05-19 NOTE — ED Triage Notes (Signed)
BIBA from [facility] c/o provider at SNF concerned for stroke. Per EMS, unknown what symptoms prompted provider to call EMS. Patient reports vision issues and HA x2 days. Patient also endorses chest heaviness. Stroke screen negative for EMS. VSS, in afib, patient awake alert conversational in NAD.

## 2020-05-19 NOTE — ED Notes (Signed)
Report called to Victorino Dike, nurse at [facility].

## 2020-05-19 NOTE — ED Notes (Signed)
[place]  [facility]  CALLED  FOR  TRANSPORT  TO  [facility]

## 2020-05-23 ENCOUNTER — Telehealth: Payer: Self-pay

## 2020-05-23 NOTE — Telephone Encounter (Signed)
Completed medical records for [facility]  Mailed to [facility]  [id number]  [address]  [place] [address]

## 2020-06-07 ENCOUNTER — Telehealth: Payer: Self-pay

## 2020-06-07 NOTE — Telephone Encounter (Signed)
Completed medical records for [facility], faxed to [phone]

## 2020-09-12 ENCOUNTER — Emergency Department
Admission: EM | Admit: 2020-09-12 | Discharge: 2020-09-12 | Disposition: A | Discharge status: Home / Self Care | Payer: Medicare Other | Attending: Emergency Medicine | Admitting: Emergency Medicine

## 2020-09-12 ENCOUNTER — Emergency Department: Payer: Medicare Other

## 2020-09-12 ENCOUNTER — Other Ambulatory Visit: Payer: Self-pay

## 2020-09-12 DIAGNOSIS — Z7982 Long term (current) use of aspirin: Secondary | ICD-10-CM | POA: Insufficient documentation

## 2020-09-12 DIAGNOSIS — E1122 Type 2 diabetes mellitus with diabetic chronic kidney disease: Secondary | ICD-10-CM | POA: Diagnosis not present

## 2020-09-12 DIAGNOSIS — Z7902 Long term (current) use of antithrombotics/antiplatelets: Secondary | ICD-10-CM | POA: Insufficient documentation

## 2020-09-12 DIAGNOSIS — Z87891 Personal history of nicotine dependence: Secondary | ICD-10-CM | POA: Insufficient documentation

## 2020-09-12 DIAGNOSIS — Z23 Encounter for immunization: Secondary | ICD-10-CM | POA: Diagnosis not present

## 2020-09-12 DIAGNOSIS — L03115 Cellulitis of right lower limb: Secondary | ICD-10-CM | POA: Diagnosis not present

## 2020-09-12 DIAGNOSIS — E1169 Type 2 diabetes mellitus with other specified complication: Secondary | ICD-10-CM | POA: Insufficient documentation

## 2020-09-12 DIAGNOSIS — R2241 Localized swelling, mass and lump, right lower limb: Secondary | ICD-10-CM | POA: Diagnosis present

## 2020-09-12 DIAGNOSIS — I509 Heart failure, unspecified: Secondary | ICD-10-CM | POA: Insufficient documentation

## 2020-09-12 DIAGNOSIS — E1165 Type 2 diabetes mellitus with hyperglycemia: Secondary | ICD-10-CM | POA: Insufficient documentation

## 2020-09-12 DIAGNOSIS — E782 Mixed hyperlipidemia: Secondary | ICD-10-CM | POA: Insufficient documentation

## 2020-09-12 DIAGNOSIS — J449 Chronic obstructive pulmonary disease, unspecified: Secondary | ICD-10-CM | POA: Insufficient documentation

## 2020-09-12 DIAGNOSIS — N183 Chronic kidney disease, stage 3 unspecified: Secondary | ICD-10-CM | POA: Diagnosis not present

## 2020-09-12 DIAGNOSIS — E039 Hypothyroidism, unspecified: Secondary | ICD-10-CM | POA: Insufficient documentation

## 2020-09-12 DIAGNOSIS — Z79899 Other long term (current) drug therapy: Secondary | ICD-10-CM | POA: Insufficient documentation

## 2020-09-12 DIAGNOSIS — I130 Hypertensive heart and chronic kidney disease with heart failure and stage 1 through stage 4 chronic kidney disease, or unspecified chronic kidney disease: Secondary | ICD-10-CM | POA: Insufficient documentation

## 2020-09-12 DIAGNOSIS — Z794 Long term (current) use of insulin: Secondary | ICD-10-CM | POA: Diagnosis not present

## 2020-09-12 LAB — COMPREHENSIVE METABOLIC PANEL
ALT: 17 U/L (ref 0–44)
AST: 20 U/L (ref 15–41)
Albumin: 2.5 g/dL — ABNORMAL LOW (ref 3.5–5.0)
Alkaline Phosphatase: 117 U/L (ref 38–126)
Anion gap: 9 (ref 5–15)
BUN: 46 mg/dL — ABNORMAL HIGH (ref 8–23)
CO2: 24 mmol/L (ref 22–32)
Calcium: 7.7 mg/dL — ABNORMAL LOW (ref 8.9–10.3)
Chloride: 103 mmol/L (ref 98–111)
Creatinine, Ser: 1.86 mg/dL — ABNORMAL HIGH (ref 0.44–1.00)
GFR, Estimated: 30 mL/min — ABNORMAL LOW (ref >60)
Glucose, Bld: 209 mg/dL — ABNORMAL HIGH (ref 70–99)
Potassium: 4 mmol/L (ref 3.5–5.1)
Sodium: 136 mmol/L (ref 135–145)
Total Bilirubin: 0.6 mg/dL (ref 0.3–1.2)
Total Protein: 6.2 g/dL — ABNORMAL LOW (ref 6.5–8.1)

## 2020-09-12 LAB — CBC WITH DIFFERENTIAL/PLATELET
Abs Immature Granulocytes: 0.04 10*3/uL (ref 0.00–0.07)
Basophils Absolute: 0 10*3/uL (ref 0.0–0.1)
Basophils Relative: 0 %
Eosinophils Absolute: 0.1 10*3/uL (ref 0.0–0.5)
Eosinophils Relative: 1 %
HCT: 28.1 % — ABNORMAL LOW (ref 36.0–46.0)
Hemoglobin: 9.2 g/dL — ABNORMAL LOW (ref 12.0–15.0)
Immature Granulocytes: 0 %
Lymphocytes Relative: 12 %
Lymphs Abs: 1.1 10*3/uL (ref 0.7–4.0)
MCH: 30.4 pg (ref 26.0–34.0)
MCHC: 32.7 g/dL (ref 30.0–36.0)
MCV: 92.7 fL (ref 80.0–100.0)
Monocytes Absolute: 0.6 10*3/uL (ref 0.1–1.0)
Monocytes Relative: 7 %
Neutro Abs: 7.3 10*3/uL (ref 1.7–7.7)
Neutrophils Relative %: 80 %
Platelets: 149 10*3/uL — ABNORMAL LOW (ref 150–400)
RBC: 3.03 MIL/uL — ABNORMAL LOW (ref 3.87–5.11)
RDW: 13.9 % (ref 11.5–15.5)
WBC: 9.2 10*3/uL (ref 4.0–10.5)
nRBC: 0 % (ref 0.0–0.2)

## 2020-09-12 LAB — HEMOGLOBIN A1C
Hgb A1c MFr Bld: 8 % — ABNORMAL HIGH (ref 4.8–5.6)
Mean Plasma Glucose: 182.9 mg/dL

## 2020-09-12 LAB — CBG MONITORING, ED: Glucose-Capillary: 227 mg/dL — ABNORMAL HIGH (ref 70–99)

## 2020-09-12 LAB — SEDIMENTATION RATE: Sed Rate: 125 mm/hr — ABNORMAL HIGH (ref 0–30)

## 2020-09-12 LAB — LACTIC ACID, PLASMA
Lactic Acid, Venous: 1.4 mmol/L (ref 0.5–1.9)
Lactic Acid, Venous: 0.8 mmol/L (ref 0.5–1.9)

## 2020-09-12 MED ORDER — VANCOMYCIN HCL IN DEXTROSE 1-5 GM/200ML-% IV SOLN
1000.0000 mg | Freq: Once | INTRAVENOUS | Status: AC
  Administered 2020-09-12: 1000 mg via INTRAVENOUS
  Filled 2020-09-12: qty 200

## 2020-09-12 MED ORDER — SULFAMETHOXAZOLE-TRIMETHOPRIM 800-160 MG PO TABS: 1.0000 | ORAL_TABLET | Freq: Two times a day (BID) | ORAL | 0 refills | Status: DC

## 2020-09-12 MED ORDER — INSULIN ASPART 100 UNIT/ML IJ SOLN
0.0000 [IU] | INTRAMUSCULAR | Status: DC
  Administered 2020-09-12 (×2): 5 [IU] via SUBCUTANEOUS
  Filled 2020-09-12 (×2): qty 1

## 2020-09-12 MED ORDER — TETANUS-DIPHTH-ACELL PERTUSSIS 5-2.5-18.5 LF-MCG/0.5 IM SUSY
0.5000 mL | PREFILLED_SYRINGE | Freq: Once | INTRAMUSCULAR | Status: AC
  Administered 2020-09-12: 0.5 mL via INTRAMUSCULAR
  Filled 2020-09-12: qty 0.5

## 2020-09-12 MED ORDER — LACTATED RINGERS IV BOLUS
500.0000 mL | Freq: Once | INTRAVENOUS | Status: AC
  Administered 2020-09-12: 500 mL via INTRAVENOUS

## 2020-09-12 MED ORDER — HYDROCODONE-ACETAMINOPHEN 5-325 MG PO TABS
1.0000 | ORAL_TABLET | Freq: Once | ORAL | Status: AC
  Administered 2020-09-12: 1 via ORAL
  Filled 2020-09-12: qty 1

## 2020-09-12 NOTE — ED Notes (Signed)
US at the bedside

## 2020-09-12 NOTE — ED Notes (Signed)
[facility] called for patient transport to [facility] waiting on [facility]

## 2020-09-12 NOTE — ED Notes (Addendum)
Called SNF [facility]) x 2 with no answer.

## 2020-09-12 NOTE — ED Triage Notes (Signed)
Pt comes into the ED via EMS from [facility] with wound infection right foot , hx of MRSA. [id number] 100%2L [place], pt is on continuous O2

## 2020-09-12 NOTE — ED Provider Notes (Signed)
[facility] Emergency Department Provider Note  ____________________________________________   Event Date/Time   First MD Initiated Contact with Patient 09/12/20 1726     (approximate)  I have reviewed the triage vital signs and the nursing notes.   HISTORY  Chief Complaint Wound Infection   HPI Deanna Rice is a 65 y.o. female with a past medical history of COPD on 2 L at baseline, CAD, CHF, HTN, HDL, OSA, depression, hypothyroidism, osteoporosis, peripheral venous insufficiency, gout and CKD who presents EMS from nursing facility for assessment of 3 to 4 days of worsening pain redness and swelling of the base of the right foot.  Patient states she has chronic wounds in her left leg and left second toe but never had pain of the base of her right foot.  She denies any associated recent injuries or falls.  She states her bandages are managed on the left leg by staff nursing facility and wound care clinic.  She states she has no other associated sick symptoms including pain in the right knee, hip or knee pain left lower extremity.  No fevers, chills, cough, nausea, vomiting, diarrhea, dysuria, rash or any other acute sick symptoms.  No other acute concerns at this time.         Past Medical History:  Diagnosis Date   Anemia, unspecified    CHF (congestive heart failure) (HCC)    Chronic ischemic heart disease, unspecified    COPD (chronic obstructive pulmonary disease) (HCC)    Dependence on supplemental oxygen    Diabetes mellitus without complication (HCC)    Gout, unspecified    Hypertension    Mixed hyperlipidemia    Obstructive chronic bronchitis without exacerbation ([place])    Obstructive sleep apnea syndrome    Peripheral venous insufficiency    Primary generalized hypertrophic osteoarthrosis    Primary hypothyroidism    Severe recurrent major depression with psychotic features [facility])     Patient Active Problem List   Diagnosis Date Noted    Acute metabolic encephalopathy 30/10/2328   Primary hypothyroidism    Type 2 diabetes mellitus with stage 3 chronic kidney disease ([place])    Acute lower UTI    Chest pain 07/04/2019   Furuncle of vulva 02/28/2019   Cellulitis of left lower extremity 12/31/2018   Diabetic ulcer of left lower leg associated with type 2 diabetes mellitus ([place]) 12/31/2018   Uncontrolled type 2 diabetes mellitus with hyperglycemia ([place]) 12/31/2018   Chronic pain syndrome 08/03/2017   Oxygen dependent 08/03/2017   Thrombocytopenia ([place]) 09/28/2015   Anemia in chronic kidney disease 09/28/2015   Diarrhea 11/28/2014   AKI (acute kidney injury) ([place]) 11/28/2014   COPD (chronic obstructive pulmonary disease) ([place]) 11/28/2014   Uncontrolled type 2 diabetes mellitus with hypoglycemia ([place]) 11/28/2014   HTN (hypertension) 11/28/2014    Past Surgical History:  Procedure Laterality Date   TONSILLECTOMY      Prior to Admission medications   Medication Sig Start Date End Date Taking? Authorizing Provider  sulfamethoxazole-trimethoprim (BACTRIM DS) 800-160 MG tablet Take 1 tablet by mouth 2 (two) times daily for 10 days. 09/12/20 09/22/20 Yes Lucrezia Starch, MD  ADVAIR DISKUS 250-50 MCG/DOSE AEPB INHALE 1 PUFF BY MOUTH INTO THE LUNGS 2 TIMES A DAY Patient taking differently: Inhale 1 puff into the lungs in the morning and at bedtime. 05/27/19   Kendell Bane, NP  allopurinol (ZYLOPRIM) 100 MG tablet TAKE 1 TABLET BY MOUTH DAILY 05/27/19   Kendell Bane,  NP  amLODipine (NORVASC) 5 MG tablet Take 1 tablet (5 mg total) by mouth daily. 04/13/20   Jennye Boroughs, MD  aspirin EC 81 MG tablet Take 1 tablet (81 mg total) by mouth daily. 04/12/20   Jennye Boroughs, MD  Blood Glucose Monitoring Suppl (ONE TOUCH ULTRA 2) w/Device KIT by Does not apply route. Use as directed DX e11.65    [provider]  clopidogrel (PLAVIX) 75 MG tablet TAKE 1 TABLET BY MOUTH DAILY 05/27/19   Kendell Bane, NP  diclofenac sodium  (VOLTAREN) 1 % GEL Apply 2 g topically 3 (three) times daily as needed (Pain in left leg). 04/12/20   Jennye Boroughs, MD  DULoxetine (CYMBALTA) 60 MG capsule TAKE ONE CAPSULE BY MOUTH TWICE A DAY 05/27/19   Kendell Bane, NP  fluticasone (FLONASE) 50 MCG/ACT nasal spray PLACE 2 SPRAYS INTO BOTH NOSTRILS DAILY 05/27/19   Kendell Bane, NP  folic acid (FOLVITE) 1 MG tablet TAKE 1 TABLET BY MOUTH DAILY 05/27/19   Kendell Bane, NP  glucose blood (ONETOUCH ULTRA) test strip 1 each by Other route 4 (four) times daily. DX e11.65    [provider]  insulin aspart (NOVOLOG FLEXPEN) 100 UNIT/ML FlexPen Use as directed with sliding scale maximum 63 units no more Patient taking differently: Inject into the skin. Use as directed with sliding scale 03/30/19   Kendell Bane, NP  Insulin Glargine [facility]) 100 UNIT/ML Inject 18 units subcutaneously in the morning and 25 units subcutaneously at bedtime 04/12/20   Jennye Boroughs, MD  Insulin Pen Needle (GLOBAL EASE INJECT PEN NEEDLES) 32G X 4 MM MISC Use  As directed 12/28/18   Ronnell Freshwater, NP  Ipratropium-Albuterol (COMBIVENT RESPIMAT) 20-100 MCG/ACT AERS respimat Inhale 1 puff into the lungs every 6 (six) hours as needed for wheezing or shortness of breath. 04/12/20   Jennye Boroughs, MD  isosorbide mononitrate (IMDUR) 30 MG 24 hr tablet TAKE 1 TABLET BY MOUTH DAILY 05/27/19   Kendell Bane, NP  loratadine (CLARITIN) 10 MG tablet TAKE 1 TABLET BY MOUTH DAILY 06/23/19   Kendell Bane, NP  Melatonin 10 MG TABS Take 10 mg by mouth at bedtime. 04/12/20   Jennye Boroughs, MD  metoprolol succinate (TOPROL-XL) 25 MG 24 hr tablet Take 2 tablets (50 mg total) by mouth daily. 04/12/20   Jennye Boroughs, MD  Microlet Lancets MISC by Does not apply route 4 (four) times daily. DX e11.65    [provider]  Multiple Vitamins-Minerals (MULTIVITAMIN WITH MINERALS) tablet Take 1 tablet by mouth daily.    [provider]  nitroGLYCERIN  (NITROLINGUAL) 0.4 MG/SPRAY spray PLACE 1 SPRAY UNDER THE TONGUE EVERY 5 MINUTES FOR 3 DOSES AS NEEDED FOR CHEST PAIN. 06/19/18   Ronnell Freshwater, NP  nystatin (MYCOSTATIN/NYSTOP) powder Apply 1 application topically 3 (three) times daily. 03/21/20   Blake Divine, MD  omeprazole (PRILOSEC) 40 MG capsule TAKE 1 CAPSULE BY MOUTH DAILY 05/27/19   Kendell Bane, NP  OXYGEN Inhale into the lungs. Inhale 4 liters into lungs continously    [provider]  silver sulfADIAZINE (SILVADENE) 1 % cream APPLY 1 APPLICATION TOPICALLY DAILY 02/15/19   Ronnell Freshwater, NP  simvastatin (ZOCOR) 80 MG tablet Take 80 mg by mouth daily.    [provider]  VICTOZA 18 MG/3ML SOPN INJECT 1.8 MG EVERY MORNING 05/27/19   Kendell Bane, NP    Allergies Patient has no known allergies.  Family History  Problem Relation Age of Onset   Hypertension Mother    Diabetes Mother    CAD Mother    CAD Father    Diabetes Father    Hypertension Father    Hypertension Sister    Diabetes Sister    CAD Sister     Social History Social History   Tobacco Use   Smoking status: Former   Smokeless tobacco: Never  Scientific laboratory technician Use: Never used  Substance Use Topics   Alcohol use: No   Drug use: No    Review of Systems  Review of Systems  Constitutional:  Negative for chills and fever.  HENT:  Negative for sore throat.   Eyes:  Negative for pain.  Respiratory:  Positive for shortness of breath (chronic w/ exertion). Negative for cough and stridor.   Cardiovascular:  Negative for chest pain.  Gastrointestinal:  Negative for vomiting.  Genitourinary:  Negative for dysuria.  Musculoskeletal:  Positive for myalgias (R foot, extending up to mid calf).  Skin:  Negative for rash.  Neurological:  Negative for seizures, loss of consciousness and headaches.  Psychiatric/Behavioral:  Negative for suicidal ideas.   All other systems reviewed and are negative.     ____________________________________________   PHYSICAL EXAM:  VITAL SIGNS: ED Triage Vitals [09/12/20 1532]  Enc Vitals Group     BP (!) 153/87     Pulse Rate 74     Resp 18     Temp 98 F (36.7 C)     Temp src      SpO2 100 %     Weight      Height      Head Circumference      Peak Flow      Pain Score      Pain Loc      Pain Edu?      Excl. in [place]?    Vitals:   09/12/20 1830 09/12/20 1900  BP: 135/66 (!) 147/81  Pulse: 91 83  Resp: 18   Temp:    SpO2: 100% 99%   Physical Exam Vitals and nursing note reviewed.  Constitutional:      General: She is not in acute distress.    Appearance: She is well-developed. She is obese.  HENT:     Head: Normocephalic and atraumatic.     Right Ear: External ear normal.     Left Ear: External ear normal.     Nose: Nose normal.  Eyes:     Conjunctiva/sclera: Conjunctivae normal.  Cardiovascular:     Rate and Rhythm: Normal rate and regular rhythm.     Heart sounds: No murmur heard. Pulmonary:     Effort: Pulmonary effort is normal. No respiratory distress.     Breath sounds: Normal breath sounds.  Abdominal:     Palpations: Abdomen is soft.     Tenderness: There is no abdominal tenderness.  Musculoskeletal:     Cervical back: Neck supple.     Right lower leg: Edema present.     Left lower leg: Edema present.  Skin:    General: Skin is warm and dry.  Neurological:     Mental Status: She is alert and oriented to person, place, and time.  Psychiatric:        Mood and Affect: Mood normal.    Patient has some chronic appearing weeping wounds from getting anterior aspect of the left lower leg.  There is also some skin breakdown appearing in second toe  without significant tenderness or warmth.  Sensation intact light touch throughout both lower extremities.  Patient has significant erythema warmth and tenderness in oval-shaped area approximately 10 cm in diameter in the right midfoot with the edema and tenderness  extending up to the mid calf.  There is no large effusion at the ankle or erythema over the ankle or pain on passive range of motion ankle but significant pain on palpation of the midfoot and medial aspect of the right foot.    ____________________________________________   LABS (all labs ordered are listed, but only abnormal results are displayed)  Labs Reviewed  COMPREHENSIVE METABOLIC PANEL - Abnormal; Notable for the following components:      Result Value   Glucose, Bld 209 (*)    BUN 46 (*)    Creatinine, Ser 1.86 (*)    Calcium 7.7 (*)    Total Protein 6.2 (*)    Albumin 2.5 (*)    GFR, Estimated 30 (*)    All other components within normal limits  CBC WITH DIFFERENTIAL/PLATELET - Abnormal; Notable for the following components:   RBC 3.03 (*)    Hemoglobin 9.2 (*)    HCT 28.1 (*)    Platelets 149 (*)    All other components within normal limits  SEDIMENTATION RATE - Abnormal; Notable for the following components:   Sed Rate 125 (*)    All other components within normal limits  CULTURE, BLOOD (SINGLE)  AEROBIC/ANAEROBIC CULTURE W GRAM STAIN (SURGICAL/DEEP WOUND)  LACTIC ACID, PLASMA  LACTIC ACID, PLASMA  URINALYSIS, COMPLETE (UACMP) WITH MICROSCOPIC  C-REACTIVE PROTEIN  HEMOGLOBIN A1C   ____________________________________________  EKG  ____________________________________________  RADIOLOGY  ED MD interpretation: Ultrasound of the bilateral extremity shows no evidence of DVT.  X-ray of the right foot shows no fracture dislocation or findings suggestive of osteomyelitis.  Official radiology report(s): US Venous Img Lower Bilateral  Result Date: 09/12/2020 CLINICAL DATA:  Chronic bilateral lower extremity edema and pain. EXAM: BILATERAL LOWER EXTREMITY VENOUS DOPPLER ULTRASOUND TECHNIQUE: Gray-scale sonography with graded compression, as well as color Doppler and duplex ultrasound were performed to evaluate the lower extremity deep venous systems from the level  of the common femoral vein and including the common femoral, femoral, profunda femoral, popliteal and calf veins including the posterior tibial, peroneal and gastrocnemius veins when visible. The superficial great saphenous vein was also interrogated. Spectral Doppler was utilized to evaluate flow at rest and with distal augmentation maneuvers in the common femoral, femoral and popliteal veins. COMPARISON:  December 17, 2011. FINDINGS: RIGHT LOWER EXTREMITY Common Femoral Vein: No evidence of thrombus. Normal compressibility, respiratory phasicity and response to augmentation. Saphenofemoral Junction: No evidence of thrombus. Normal compressibility and flow on color Doppler imaging. Profunda Femoral Vein: No evidence of thrombus. Normal compressibility and flow on color Doppler imaging. Femoral Vein: No evidence of thrombus. Normal compressibility, respiratory phasicity and response to augmentation. Popliteal Vein: No evidence of thrombus. Normal compressibility, respiratory phasicity and response to augmentation. Calf Veins: No evidence of thrombus. Normal compressibility and flow on color Doppler imaging. Venous Reflux:  None. Other Findings:  None. LEFT LOWER EXTREMITY Common Femoral Vein: No evidence of thrombus. Normal compressibility, respiratory phasicity and response to augmentation. Saphenofemoral Junction: No evidence of thrombus. Normal compressibility and flow on color Doppler imaging. Profunda Femoral Vein: No evidence of thrombus. Normal compressibility and flow on color Doppler imaging. Femoral Vein: No evidence of thrombus. Normal compressibility, respiratory phasicity and response to augmentation. Popliteal Vein: No evidence of thrombus. Normal  compressibility, respiratory phasicity and response to augmentation. Calf Veins: No evidence of thrombus. Normal compressibility and flow on color Doppler imaging. Venous Reflux:  None. Other Findings:  None. IMPRESSION: No evidence of deep venous thrombosis  in either lower extremity. Electronically Signed   By: Marijo Conception M.D.   On: 09/12/2020 19:44   DG Foot Complete Right  Result Date: 09/12/2020 CLINICAL DATA:  Foot infection. Unable hold still due to pain. Wound infection right foot. History of MRSA. EXAM: RIGHT FOOT COMPLETE - 3+ VIEW COMPARISON:  None. FINDINGS: There is no evidence of fracture or dislocation. No cortical erosion or destruction. There is no evidence of severe arthropathy or other focal bone abnormality. Distal leg subcutaneus soft tissue edema. Dorsum of foot subcutaneus soft tissue edema. IMPRESSION: 1. No radiographic findings of osteomyelitis. 2.  No acute displaced fracture or dislocation. Electronically Signed   By: Iven Finn M.D.   On: 09/12/2020 18:40    ____________________________________________   PROCEDURES  Procedure(s) performed (including Critical Care):  Procedures   ____________________________________________   INITIAL IMPRESSION / ASSESSMENT AND PLAN / ED COURSE     Patient presents with above-stated history exam for assessment of peripheral days of worsening pain redness and swelling at the base of the right foot.  This in the setting of chronic venous insufficiency and some chronic wounds in the left leg.  She denies any associated trauma or injuries.  On arrival she is hypertensive with otherwise stable vital signs on room air.  She denies any other associated sick or acute symptoms.  Ultrasound of the bilateral extremity shows no evidence of DVT.  X-ray of the right foot shows no fracture dislocation or findings suggestive of osteomyelitis.  She does not recall any injuries or trauma while there is a very small skin defect possibly consistent with a very superficial puncture wound centered about the area of erythema warmth and tenderness as depicted above photo.  Area is fairly well demarcated and overall lower suspicion for sepsis or osteomyelitis at this time.  Patient has no fever or  leukocytosis.  In addition lactic acid is nonelevated.  Compartments are soft in the calf and foot and are not consistent with acute compartment syndrome.  CMP shows hyperglycemia with a glucose of 209 and a creatinine of 1.86 compared to 1.683 months ago without any other significant electrolyte or metabolic derangements.  CBC unremarkable for acute derangements.  ESR is elevated at 125 somewhat nonspecific.  Blood and wound culture obtained patient requested vancomycin pending initial work-up.  Given otherwise reassuring imaging and remainder of labs with low suspicion for osteomyelitis or septic joint I think patient is likely safe for discharge with close outpatient podiatry follow-up.  I discussed this with on-call podiatrist Dr. Amalia Hailey who agreed after reviewing imaging and work-up recommended patient be seen in his clinic tomorrow.  I advised patient of this.  We will write Rx for Bactrim.  We will also update tetanus.  Discharged stable condition.  Strict return precautions advised and discussed.     ____________________________________________   FINAL CLINICAL IMPRESSION(S) / ED DIAGNOSES  Final diagnoses:  Cellulitis of right lower extremity    Medications  insulin aspart (novoLOG) injection 0-15 Units (5 Units Subcutaneous Given 09/12/20 1826)  HYDROcodone-acetaminophen (NORCO/VICODIN) 5-325 MG per tablet 1 tablet (has no administration in time range)  Tdap (BOOSTRIX) injection 0.5 mL (has no administration in time range)  lactated ringers bolus 500 mL (500 mLs Intravenous New Bag/Given 09/12/20 1809)  vancomycin (VANCOCIN)  IVPB 1000 mg/200 mL premix (0 mg Intravenous Stopped 09/12/20 2005)     ED Discharge Orders          Ordered    sulfamethoxazole-trimethoprim (BACTRIM DS) 800-160 MG tablet  2 times daily        09/12/20 2006             Note:  This document was prepared using Dragon voice recognition software and may include unintentional dictation errors.    Lucrezia Starch, MD 09/12/20 2011

## 2020-09-12 NOTE — ED Notes (Signed)
Called SNF to give report, no answer and no option to leave a message, will try again in 5 minutes

## 2020-09-12 NOTE — ED Notes (Signed)
Purewick placed while pt waiting for transport. Updated with verbal understanding

## 2020-09-12 NOTE — Consult Note (Signed)
PHARMACY -  BRIEF ANTIBIOTIC NOTE   Pharmacy has received consult(s) for vancomycin from an ED provider.  The patient's profile has been reviewed for ht/wt/allergies/indication/available labs.    One time order(s) placed for  --Vancomycin 1 g IV  Further antibiotics/pharmacy consults should be ordered by admitting physician if indicated.                       Thank you, Tressie Ellis 09/12/2020  5:52 PM

## 2020-09-14 ENCOUNTER — Telehealth: Payer: Self-pay | Admitting: *Deleted

## 2020-09-14 NOTE — Telephone Encounter (Signed)
I called the patient to schedule her appointment.  She said someone from the facility must schedule her appointments.  I left a message for [name], Mining engineer,  to call me back tomorrow to schedule Deanna Rice an appointment.

## 2020-09-14 NOTE — Telephone Encounter (Signed)
-----   Message from Felecia Shelling, DPM sent at 09/14/2020  1:51 PM EDT ----- Regarding: f/u appt from [facility] ED Patient was seen in the emergency department 7/19. The ED doc called me about this patient. Can we try to reach out to her and get her a f/u appt as soon as possible with any of the providers (except Stacie Acres)? Thanks, Dr. Logan Bores

## 2020-09-18 LAB — CULTURE, BLOOD (SINGLE)
Culture: NO GROWTH
Special Requests: ADEQUATE

## 2020-09-18 LAB — AEROBIC/ANAEROBIC CULTURE W GRAM STAIN (SURGICAL/DEEP WOUND)
Culture: NO GROWTH
Gram Stain: NONE SEEN

## 2020-09-19 ENCOUNTER — Emergency Department: Payer: Medicare Other

## 2020-09-19 ENCOUNTER — Other Ambulatory Visit: Payer: Self-pay

## 2020-09-19 ENCOUNTER — Inpatient Hospital Stay
Admission: EM | Admit: 2020-09-19 | Discharge: 2020-09-25 | DRG: 299 | Disposition: A | Discharge status: Skilled Nursing Facility | Payer: Medicare Other | Attending: Internal Medicine | Admitting: Internal Medicine

## 2020-09-19 ENCOUNTER — Encounter: Payer: Self-pay | Admitting: Intensive Care

## 2020-09-19 DIAGNOSIS — E039 Hypothyroidism, unspecified: Secondary | ICD-10-CM | POA: Diagnosis not present

## 2020-09-19 DIAGNOSIS — N170 Acute kidney failure with tubular necrosis: Secondary | ICD-10-CM | POA: Diagnosis present

## 2020-09-19 DIAGNOSIS — L03119 Cellulitis of unspecified part of limb: Secondary | ICD-10-CM | POA: Diagnosis not present

## 2020-09-19 DIAGNOSIS — I2510 Atherosclerotic heart disease of native coronary artery without angina pectoris: Secondary | ICD-10-CM | POA: Diagnosis present

## 2020-09-19 DIAGNOSIS — M109 Gout, unspecified: Secondary | ICD-10-CM | POA: Diagnosis not present

## 2020-09-19 DIAGNOSIS — Z79890 Hormone replacement therapy (postmenopausal): Secondary | ICD-10-CM

## 2020-09-19 DIAGNOSIS — N1831 Chronic kidney disease, stage 3a: Secondary | ICD-10-CM | POA: Diagnosis not present

## 2020-09-19 DIAGNOSIS — F32A Depression, unspecified: Secondary | ICD-10-CM | POA: Diagnosis present

## 2020-09-19 DIAGNOSIS — Z20822 Contact with and (suspected) exposure to covid-19: Secondary | ICD-10-CM | POA: Diagnosis present

## 2020-09-19 DIAGNOSIS — Z794 Long term (current) use of insulin: Secondary | ICD-10-CM

## 2020-09-19 DIAGNOSIS — Z79899 Other long term (current) drug therapy: Secondary | ICD-10-CM

## 2020-09-19 DIAGNOSIS — E1122 Type 2 diabetes mellitus with diabetic chronic kidney disease: Secondary | ICD-10-CM | POA: Diagnosis present

## 2020-09-19 DIAGNOSIS — Z9981 Dependence on supplemental oxygen: Secondary | ICD-10-CM

## 2020-09-19 DIAGNOSIS — L97411 Non-pressure chronic ulcer of right heel and midfoot limited to breakdown of skin: Secondary | ICD-10-CM | POA: Diagnosis present

## 2020-09-19 DIAGNOSIS — Z8249 Family history of ischemic heart disease and other diseases of the circulatory system: Secondary | ICD-10-CM | POA: Diagnosis not present

## 2020-09-19 DIAGNOSIS — K5900 Constipation, unspecified: Secondary | ICD-10-CM | POA: Diagnosis not present

## 2020-09-19 DIAGNOSIS — E11628 Type 2 diabetes mellitus with other skin complications: Secondary | ICD-10-CM

## 2020-09-19 DIAGNOSIS — L02611 Cutaneous abscess of right foot: Secondary | ICD-10-CM | POA: Diagnosis not present

## 2020-09-19 DIAGNOSIS — Z833 Family history of diabetes mellitus: Secondary | ICD-10-CM | POA: Diagnosis not present

## 2020-09-19 DIAGNOSIS — E782 Mixed hyperlipidemia: Secondary | ICD-10-CM | POA: Diagnosis not present

## 2020-09-19 DIAGNOSIS — I872 Venous insufficiency (chronic) (peripheral): Secondary | ICD-10-CM | POA: Diagnosis present

## 2020-09-19 DIAGNOSIS — L0390 Cellulitis, unspecified: Secondary | ICD-10-CM | POA: Diagnosis present

## 2020-09-19 DIAGNOSIS — Z6841 Body Mass Index (BMI) 40.0 and over, adult: Secondary | ICD-10-CM

## 2020-09-19 DIAGNOSIS — J9611 Chronic respiratory failure with hypoxia: Secondary | ICD-10-CM | POA: Diagnosis present

## 2020-09-19 DIAGNOSIS — J449 Chronic obstructive pulmonary disease, unspecified: Secondary | ICD-10-CM | POA: Diagnosis present

## 2020-09-19 DIAGNOSIS — Z7902 Long term (current) use of antithrombotics/antiplatelets: Secondary | ICD-10-CM

## 2020-09-19 DIAGNOSIS — E11621 Type 2 diabetes mellitus with foot ulcer: Secondary | ICD-10-CM | POA: Diagnosis not present

## 2020-09-19 DIAGNOSIS — I5032 Chronic diastolic (congestive) heart failure: Secondary | ICD-10-CM | POA: Diagnosis not present

## 2020-09-19 DIAGNOSIS — E1151 Type 2 diabetes mellitus with diabetic peripheral angiopathy without gangrene: Secondary | ICD-10-CM | POA: Diagnosis not present

## 2020-09-19 DIAGNOSIS — I130 Hypertensive heart and chronic kidney disease with heart failure and stage 1 through stage 4 chronic kidney disease, or unspecified chronic kidney disease: Secondary | ICD-10-CM | POA: Diagnosis not present

## 2020-09-19 DIAGNOSIS — L03115 Cellulitis of right lower limb: Secondary | ICD-10-CM | POA: Diagnosis not present

## 2020-09-19 DIAGNOSIS — Z7982 Long term (current) use of aspirin: Secondary | ICD-10-CM

## 2020-09-19 DIAGNOSIS — Z7951 Long term (current) use of inhaled steroids: Secondary | ICD-10-CM

## 2020-09-19 DIAGNOSIS — Z66 Do not resuscitate: Secondary | ICD-10-CM | POA: Diagnosis not present

## 2020-09-19 DIAGNOSIS — L0291 Cutaneous abscess, unspecified: Secondary | ICD-10-CM | POA: Diagnosis present

## 2020-09-19 DIAGNOSIS — G4733 Obstructive sleep apnea (adult) (pediatric): Secondary | ICD-10-CM | POA: Diagnosis present

## 2020-09-19 DIAGNOSIS — E1142 Type 2 diabetes mellitus with diabetic polyneuropathy: Secondary | ICD-10-CM | POA: Diagnosis not present

## 2020-09-19 DIAGNOSIS — Z87891 Personal history of nicotine dependence: Secondary | ICD-10-CM

## 2020-09-19 DIAGNOSIS — M1990 Unspecified osteoarthritis, unspecified site: Secondary | ICD-10-CM | POA: Diagnosis present

## 2020-09-19 LAB — CBC WITH DIFFERENTIAL/PLATELET
Abs Immature Granulocytes: 0.11 10*3/uL — ABNORMAL HIGH (ref 0.00–0.07)
Basophils Absolute: 0 10*3/uL (ref 0.0–0.1)
Basophils Relative: 1 %
Eosinophils Absolute: 0.1 10*3/uL (ref 0.0–0.5)
Eosinophils Relative: 2 %
HCT: 27.7 % — ABNORMAL LOW (ref 36.0–46.0)
Hemoglobin: 9.3 g/dL — ABNORMAL LOW (ref 12.0–15.0)
Immature Granulocytes: 1 %
Lymphocytes Relative: 14 %
Lymphs Abs: 1.1 10*3/uL (ref 0.7–4.0)
MCH: 30.3 pg (ref 26.0–34.0)
MCHC: 33.6 g/dL (ref 30.0–36.0)
MCV: 90.2 fL (ref 80.0–100.0)
Monocytes Absolute: 0.4 10*3/uL (ref 0.1–1.0)
Monocytes Relative: 5 %
Neutro Abs: 6.5 10*3/uL (ref 1.7–7.7)
Neutrophils Relative %: 77 %
Platelets: 177 10*3/uL (ref 150–400)
RBC: 3.07 MIL/uL — ABNORMAL LOW (ref 3.87–5.11)
RDW: 13.9 % (ref 11.5–15.5)
WBC: 8.3 10*3/uL (ref 4.0–10.5)
nRBC: 0 % (ref 0.0–0.2)

## 2020-09-19 LAB — RESP PANEL BY RT-PCR (FLU A&B, COVID) ARPGX2
Influenza A by PCR: NEGATIVE
Influenza B by PCR: NEGATIVE
SARS Coronavirus 2 by RT PCR: NEGATIVE

## 2020-09-19 LAB — COMPREHENSIVE METABOLIC PANEL
ALT: 18 U/L (ref 0–44)
AST: 19 U/L (ref 15–41)
Albumin: 2.6 g/dL — ABNORMAL LOW (ref 3.5–5.0)
Alkaline Phosphatase: 134 U/L — ABNORMAL HIGH (ref 38–126)
Anion gap: 10 (ref 5–15)
BUN: 47 mg/dL — ABNORMAL HIGH (ref 8–23)
CO2: 24 mmol/L (ref 22–32)
Calcium: 8.1 mg/dL — ABNORMAL LOW (ref 8.9–10.3)
Chloride: 101 mmol/L (ref 98–111)
Creatinine, Ser: 2.43 mg/dL — ABNORMAL HIGH (ref 0.44–1.00)
GFR, Estimated: 21 mL/min — ABNORMAL LOW (ref >60)
Glucose, Bld: 235 mg/dL — ABNORMAL HIGH (ref 70–99)
Potassium: 4.6 mmol/L (ref 3.5–5.1)
Sodium: 135 mmol/L (ref 135–145)
Total Bilirubin: 0.5 mg/dL (ref 0.3–1.2)
Total Protein: 6.5 g/dL (ref 6.5–8.1)

## 2020-09-19 LAB — LACTIC ACID, PLASMA: Lactic Acid, Venous: 1 mmol/L (ref 0.5–1.9)

## 2020-09-19 MED ORDER — MORPHINE SULFATE (PF) 4 MG/ML IV SOLN
4.0000 mg | INTRAVENOUS | Status: DC | PRN
Start: 2020-09-19 — End: 2020-09-20

## 2020-09-19 MED ORDER — VANCOMYCIN HCL IN DEXTROSE 1-5 GM/200ML-% IV SOLN
1000.0000 mg | Freq: Once | INTRAVENOUS | Status: AC
  Administered 2020-09-19: 1000 mg via INTRAVENOUS
  Filled 2020-09-19: qty 200

## 2020-09-19 MED ORDER — KETOROLAC TROMETHAMINE 30 MG/ML IJ SOLN
15.0000 mg | Freq: Three times a day (TID) | INTRAMUSCULAR | Status: DC | PRN
  Administered 2020-09-20: 15 mg via INTRAVENOUS
  Filled 2020-09-19: qty 1

## 2020-09-19 MED ORDER — SODIUM CHLORIDE 0.9 % IV SOLN: INTRAVENOUS | Status: DC

## 2020-09-19 MED ORDER — SODIUM CHLORIDE 0.9 % IV SOLN
1.0000 g | Freq: Once | INTRAVENOUS | Status: AC
  Administered 2020-09-19: 1 g via INTRAVENOUS
  Filled 2020-09-19: qty 10

## 2020-09-19 NOTE — ED Provider Notes (Signed)
[facility] Emergency Department Provider Note   ____________________________________________   Event Date/Time   First MD Initiated Contact with Patient 09/19/20 1646     (approximate)  I have reviewed the triage vital signs and the nursing notes.   HISTORY  Chief Complaint Wound Infection    HPI Deanna PRIESTLY is a 65 y.o. female who presents from her long-term care facility with concerns of an abscess to the plantar aspect of the right foot  LOCATION: Plantar aspect of right foot DURATION: 1.5 weeks prior to arrival TIMING: Worsening since onset SEVERITY: Severe QUALITY: Aching/throbbing CONTEXT: Unknown as to how patient had any trauma to the bottom of her foot MODIFYING FACTORS: Any attempts to put pressure on the bottom of this foot increases pain and partially relieves at rest ASSOCIATED SYMPTOMS: Denies Per medical record review patient has past history of lower extremity wounds and morbid obesity         Past Medical History:  Diagnosis Date   Anemia, unspecified    CHF (congestive heart failure) (HCC)    Chronic ischemic heart disease, unspecified    COPD (chronic obstructive pulmonary disease) ([place])    Dependence on supplemental oxygen    Diabetes mellitus without complication ([place])    Gout, unspecified    Hypertension    Mixed hyperlipidemia    Obstructive chronic bronchitis without exacerbation ([place])    Obstructive sleep apnea syndrome    Peripheral venous insufficiency    Primary generalized hypertrophic osteoarthrosis    Primary hypothyroidism    Severe recurrent major depression with psychotic features [facility])     Patient Active Problem List   Diagnosis Date Noted   Acute metabolic encephalopathy 41/93/7902   Primary hypothyroidism    Type 2 diabetes mellitus with stage 3 chronic kidney disease ([place])    Acute lower UTI    Chest pain 07/04/2019   Furuncle of vulva 02/28/2019   Cellulitis of left lower extremity  12/31/2018   Diabetic ulcer of left lower leg associated with type 2 diabetes mellitus ([place]) 12/31/2018   Uncontrolled type 2 diabetes mellitus with hyperglycemia ([place]) 12/31/2018   Chronic pain syndrome 08/03/2017   Oxygen dependent 08/03/2017   Thrombocytopenia ([place]) 09/28/2015   Anemia in chronic kidney disease 09/28/2015   Diarrhea 11/28/2014   AKI (acute kidney injury) ([place]) 11/28/2014   COPD (chronic obstructive pulmonary disease) ([place]) 11/28/2014   Uncontrolled type 2 diabetes mellitus with hypoglycemia ([place]) 11/28/2014   HTN (hypertension) 11/28/2014    Past Surgical History:  Procedure Laterality Date   TONSILLECTOMY      Prior to Admission medications   Medication Sig Start Date End Date Taking? Authorizing Provider  ADVAIR DISKUS 250-50 MCG/DOSE AEPB INHALE 1 PUFF BY MOUTH INTO THE LUNGS 2 TIMES A DAY Patient taking differently: Inhale 1 puff into the lungs in the morning and at bedtime. 05/27/19   Kendell Bane, NP  allopurinol (ZYLOPRIM) 100 MG tablet TAKE 1 TABLET BY MOUTH DAILY 05/27/19   Kendell Bane, NP  amLODipine (NORVASC) 5 MG tablet Take 1 tablet (5 mg total) by mouth daily. 04/13/20   Jennye Boroughs, MD  aspirin EC 81 MG tablet Take 1 tablet (81 mg total) by mouth daily. 04/12/20   Jennye Boroughs, MD  Blood Glucose Monitoring Suppl (ONE TOUCH ULTRA 2) w/Device KIT by Does not apply route. Use as directed DX e11.65    [provider]  clopidogrel (PLAVIX) 75 MG tablet TAKE 1 TABLET BY MOUTH DAILY 05/27/19  Kendell Bane, NP  diclofenac sodium (VOLTAREN) 1 % GEL Apply 2 g topically 3 (three) times daily as needed (Pain in left leg). 04/12/20   Jennye Boroughs, MD  DULoxetine (CYMBALTA) 60 MG capsule TAKE ONE CAPSULE BY MOUTH TWICE A DAY 05/27/19   Kendell Bane, NP  fluticasone (FLONASE) 50 MCG/ACT nasal spray PLACE 2 SPRAYS INTO BOTH NOSTRILS DAILY 05/27/19   Kendell Bane, NP  folic acid (FOLVITE) 1 MG tablet TAKE 1 TABLET BY MOUTH DAILY 05/27/19    Kendell Bane, NP  glucose blood (ONETOUCH ULTRA) test strip 1 each by Other route 4 (four) times daily. DX e11.65    [provider]  insulin aspart (NOVOLOG FLEXPEN) 100 UNIT/ML FlexPen Use as directed with sliding scale maximum 63 units no more Patient taking differently: Inject into the skin. Use as directed with sliding scale 03/30/19   Kendell Bane, NP  Insulin Glargine [facility]) 100 UNIT/ML Inject 18 units subcutaneously in the morning and 25 units subcutaneously at bedtime 04/12/20   Jennye Boroughs, MD  Insulin Pen Needle (GLOBAL EASE INJECT PEN NEEDLES) 32G X 4 MM MISC Use  As directed 12/28/18   Ronnell Freshwater, NP  Ipratropium-Albuterol (COMBIVENT RESPIMAT) 20-100 MCG/ACT AERS respimat Inhale 1 puff into the lungs every 6 (six) hours as needed for wheezing or shortness of breath. 04/12/20   Jennye Boroughs, MD  isosorbide mononitrate (IMDUR) 30 MG 24 hr tablet TAKE 1 TABLET BY MOUTH DAILY 05/27/19   Kendell Bane, NP  loratadine (CLARITIN) 10 MG tablet TAKE 1 TABLET BY MOUTH DAILY 06/23/19   Kendell Bane, NP  Melatonin 10 MG TABS Take 10 mg by mouth at bedtime. 04/12/20   Jennye Boroughs, MD  metoprolol succinate (TOPROL-XL) 25 MG 24 hr tablet Take 2 tablets (50 mg total) by mouth daily. 04/12/20   Jennye Boroughs, MD  Microlet Lancets MISC by Does not apply route 4 (four) times daily. DX e11.65    [provider]  Multiple Vitamins-Minerals (MULTIVITAMIN WITH MINERALS) tablet Take 1 tablet by mouth daily.    [provider]  nitroGLYCERIN (NITROLINGUAL) 0.4 MG/SPRAY spray PLACE 1 SPRAY UNDER THE TONGUE EVERY 5 MINUTES FOR 3 DOSES AS NEEDED FOR CHEST PAIN. 06/19/18   Ronnell Freshwater, NP  nystatin (MYCOSTATIN/NYSTOP) powder Apply 1 application topically 3 (three) times daily. 03/21/20   Blake Divine, MD  omeprazole (PRILOSEC) 40 MG capsule TAKE 1 CAPSULE BY MOUTH DAILY 05/27/19   Kendell Bane, NP  OXYGEN Inhale into the lungs. Inhale 4 liters into  lungs continously    [provider]  silver sulfADIAZINE (SILVADENE) 1 % cream APPLY 1 APPLICATION TOPICALLY DAILY 02/15/19   Ronnell Freshwater, NP  simvastatin (ZOCOR) 80 MG tablet Take 80 mg by mouth daily.    [provider]  sulfamethoxazole-trimethoprim (BACTRIM DS) 800-160 MG tablet Take 1 tablet by mouth 2 (two) times daily for 10 days. 09/12/20 09/22/20  Lucrezia Starch, MD  VICTOZA 18 MG/3ML SOPN INJECT 1.8 MG EVERY MORNING 05/27/19   Kendell Bane, NP    Allergies Patient has no known allergies.  Family History  Problem Relation Age of Onset   Hypertension Mother    Diabetes Mother    CAD Mother    CAD Father    Diabetes Father    Hypertension Father    Hypertension Sister    Diabetes Sister    CAD Sister     Social History Social History  Tobacco Use   Smoking status: Former   Smokeless tobacco: Never  Vaping Use   Vaping Use: Never used  Substance Use Topics   Alcohol use: No   Drug use: No    Review of Systems Constitutional: No fever/chills Eyes: No visual changes. ENT: No sore throat. Cardiovascular: Denies chest pain. Respiratory: Denies shortness of breath. Gastrointestinal: No abdominal pain.  No nausea, no vomiting.  No diarrhea. Genitourinary: Negative for dysuria. Musculoskeletal: Positive for acute right foot pain Skin: Negative for rash. Neurological: Negative for headaches, weakness/numbness/paresthesias in any extremity Psychiatric: Negative for suicidal ideation/homicidal ideation   ____________________________________________   PHYSICAL EXAM:  VITAL SIGNS: ED Triage Vitals  Enc Vitals Group     BP 09/19/20 1631 (!) 154/68     Pulse Rate 09/19/20 1631 76     Resp 09/19/20 1631 18     Temp 09/19/20 1631 98.4 F (36.9 C)     Temp Source 09/19/20 1631 Oral     SpO2 09/19/20 1631 100 %     Weight 09/19/20 1632 (!) 326 lb (147.9 kg)     Height 09/19/20 1632 '5\' 7"'  (1.702 m)     Head Circumference --      Peak  Flow --      Pain Score 09/19/20 1632 3     Pain Loc --      Pain Edu? --      Excl. in [place]? --    Constitutional: Alert and oriented. Well appearing and in no acute distress. Eyes: Conjunctivae are normal. PERRL. Head: Atraumatic. Nose: No congestion/rhinnorhea. Mouth/Throat: Mucous membranes are moist. Neck: No stridor Cardiovascular: Grossly normal heart sounds.  Good peripheral circulation. Respiratory: Normal respiratory effort.  No retractions. Gastrointestinal: Soft and nontender. No distention. Musculoskeletal: No obvious deformities Neurologic:  Normal speech and language. No gross focal neurologic deficits are appreciated. Skin:  Skin is warm and dry.    Psychiatric: Mood and affect are normal. Speech and behavior are normal.  ____________________________________________   LABS (all labs ordered are listed, but only abnormal results are displayed)  Labs Reviewed  COMPREHENSIVE METABOLIC PANEL - Abnormal; Notable for the following components:      Result Value   Glucose, Bld 235 (*)    BUN 47 (*)    Creatinine, Ser 2.43 (*)    Calcium 8.1 (*)    Albumin 2.6 (*)    Alkaline Phosphatase 134 (*)    GFR, Estimated 21 (*)    All other components within normal limits  CBC WITH DIFFERENTIAL/PLATELET - Abnormal; Notable for the following components:   RBC 3.07 (*)    Hemoglobin 9.3 (*)    HCT 27.7 (*)    Abs Immature Granulocytes 0.11 (*)    All other components within normal limits  CULTURE, BLOOD (SINGLE)  RESP PANEL BY RT-PCR (FLU A&B, COVID) ARPGX2  LACTIC ACID, PLASMA   RADIOLOGY  ED MD interpretation: Three-view x-ray of the right foot shows no evidence of acute osteomyelitis with diffuse subcutaneous edema  Official radiology report(s): DG Foot Complete Right  Result Date: 09/19/2020 CLINICAL DATA:  65 year old female with right foot infection. EXAM: RIGHT FOOT COMPLETE - 3+ VIEW COMPARISON:  Right foot radiograph dated 09/12/2020 FINDINGS: There is no  acute fracture or dislocation. The bones are osteopenic. No bone erosion or periosteal elevation. There is diffuse subcutaneous edema. No radiopaque foreign object or soft tissue gas. IMPRESSION: 1. No acute fracture or dislocation. No radiographic evidence of acute osteomyelitis. 2. Diffuse subcutaneous edema. Electronically  Signed   By: Anner Crete M.D.   On: 09/19/2020 17:46    ____________________________________________   PROCEDURES  Procedure(s) performed (including Critical Care):  .1-3 Lead EKG Interpretation  Date/Time: 09/19/2020 6:30 PM Performed by: Naaman Plummer, MD Authorized by: Naaman Plummer, MD     Interpretation: normal     ECG rate:  68   ECG rate assessment: normal     Rhythm: sinus rhythm     Ectopy: none     Conduction: normal     ____________________________________________   INITIAL IMPRESSION / ASSESSMENT AND PLAN / ED COURSE  As part of my medical decision making, I reviewed the following data within the electronic medical record, if available:  Nursing notes reviewed and incorporated, Labs reviewed, EKG interpreted, Old chart reviewed, Radiograph reviewed and Notes from prior ED visits reviewed and incorporated     Presentation most consistent with complex abscess/cellulitis that failed outpatient treatment with Bactrim Given History, Exam, and Workup I have low suspicion for Necrotizing Fasciitis, Abscess, Osteomyelitis, DVT or other emergent problem as a cause for this presentation.  Tx: Vancomycin, Rocephin  Disposition: Discharge. No evidence of serious bacterial illness. Nontoxic appearing, VSS. Low risk for treatment failure based on history. Strict return precautions discussed with patient with full understanding. Advised patient to follow up promptly with primary care provider within next 48 hours.     ____________________________________________   FINAL CLINICAL IMPRESSION(S) / ED DIAGNOSES  Final diagnoses:  Abscess   Cellulitis of right foot     ED Discharge Orders     None        Note:  This document was prepared using Dragon voice recognition software and may include unintentional dictation errors.    Naaman Plummer, MD 09/19/20 [phone]

## 2020-09-19 NOTE — ED Notes (Signed)
Patient given sandwich tray at this time.

## 2020-09-19 NOTE — H&P (Signed)
History and Physical    Deanna Rice AJO:[id number] DOB: [birth year] DOA: 09/19/2020  PCP: Lavera Guise, MD  Patient coming from: SNF  I have personally briefly reviewed patient's old medical records in [place]  Chief Complaint: abscess to the right foot  HPI: Deanna Rice is a 64 y.o. female with medical history significant of  65 year old woman with medical history significant for morbid obesity-BMI of 60, type 2 diabetes mellitus, OSA,COPD/chronic respiratory failure on 4 L home O2, chronic venous insufficiency, hypothyroidism, chronic diastolic CHF, chronic osteoarthritis, depression who 5 months ago was transitioned from ALF to SNF due to chronic debility and falls. Patient also has interim history of ED visit on 7/19 at which time patient was diagnosed with  cellulitis of there right foot with what appears to be small puncture like wound on plantar surface. Patient was prescribed bactrim and discharged back to SNF.Patient now returns to ed  one week later with progressive infection and right foot abscess. Patient denies any injury to foot, and noted foot is very tender with clear/yellow drainage. She denies any fever/chills/ n/v/diarrhea / chest pain or sob.   ED Course: Afeb, bp 154/68, hr 76, rr 18, sat 100%  Labs: Wbc:8.3, hgb 9.3 at baseline, plt177 Na135, cr 2.43 up from baseline of 1.6 , alkphos 134 Lactic 1 Covid:neg IMPRESSION: 1. No acute fracture or dislocation. No radiographic evidence of acute osteomyelitis. 2. Diffuse subcutaneous edema. Review of Systems: As per HPI otherwise 10 point review of systems negative.  Tx:ctx/vanc  Past Medical History:  Diagnosis Date   Anemia, unspecified    CHF (congestive heart failure) (HCC)    Chronic ischemic heart disease, unspecified    COPD (chronic obstructive pulmonary disease) (HCC)    Dependence on supplemental oxygen    Diabetes mellitus without complication (HCC)    Gout, unspecified    Hypertension     Mixed hyperlipidemia    Obstructive chronic bronchitis without exacerbation (HCC)    Obstructive sleep apnea syndrome    Peripheral venous insufficiency    Primary generalized hypertrophic osteoarthrosis    Primary hypothyroidism    Severe recurrent major depression with psychotic features ([place])     Past Surgical History:  Procedure Laterality Date   TONSILLECTOMY       reports that she has quit smoking. She has never used smokeless tobacco. She reports that she does not drink alcohol and does not use drugs.  No Known Allergies  Family History  Problem Relation Age of Onset   Hypertension Mother    Diabetes Mother    CAD Mother    CAD Father    Diabetes Father    Hypertension Father    Hypertension Sister    Diabetes Sister    CAD Sister    Prior to Admission medications   Medication Sig Start Date End Date Taking? Authorizing Provider  ADVAIR DISKUS 250-50 MCG/DOSE AEPB INHALE 1 PUFF BY MOUTH INTO THE LUNGS 2 TIMES A DAY Patient taking differently: Inhale 1 puff into the lungs in the morning and at bedtime. 05/27/19  Yes Scarboro, Audie Clear, NP  allopurinol (ZYLOPRIM) 100 MG tablet TAKE 1 TABLET BY MOUTH DAILY Patient taking differently: Take 100 mg by mouth daily. 05/27/19  Yes Scarboro, Audie Clear, NP  amLODipine (NORVASC) 5 MG tablet Take 1 tablet (5 mg total) by mouth daily. 04/13/20  Yes Jennye Boroughs, MD  aspirin EC 81 MG tablet Take 1 tablet (81 mg total) by mouth daily. 04/12/20  Yes Jennye Boroughs, MD  clopidogrel (PLAVIX) 75 MG tablet TAKE 1 TABLET BY MOUTH DAILY Patient taking differently: Take 75 mg by mouth daily. 05/27/19  Yes Scarboro, Audie Clear, NP  DULoxetine (CYMBALTA) 60 MG capsule TAKE ONE CAPSULE BY MOUTH TWICE A DAY Patient taking differently: Take 60 mg by mouth 2 (two) times daily. 05/27/19  Yes Scarboro, Audie Clear, NP  fluticasone (FLONASE) 50 MCG/ACT nasal spray PLACE 2 SPRAYS INTO BOTH NOSTRILS DAILY 05/27/19  Yes Scarboro, Audie Clear, NP  folic acid (FOLVITE) 1 MG tablet  TAKE 1 TABLET BY MOUTH DAILY Patient taking differently: Take 1 mg by mouth daily. 05/27/19  Yes Scarboro, Audie Clear, NP  insulin aspart (NOVOLOG FLEXPEN) 100 UNIT/ML FlexPen Use as directed with sliding scale maximum 63 units no more Patient taking differently: Inject into the skin 3 (three) times daily before meals. Use as directed with sliding scale 0 - 59 notify md, 60 - 150 = 0, 151 - 199 = 2 units, 200 - 249 = 4 units, 250 - 299 = 6 units, 300 - 349 = 8 units, 350 - 399 = 10 units, 400 - 1000 notify md. 03/30/19  Yes Scarboro, Audie Clear, NP  Insulin Glargine (BASAGLAR KWIKPEN) 100 UNIT/ML Inject 18 units subcutaneously in the morning and 25 units subcutaneously at bedtime 04/12/20  Yes Jennye Boroughs, MD  isosorbide mononitrate (IMDUR) 30 MG 24 hr tablet TAKE 1 TABLET BY MOUTH DAILY Patient taking differently: Take 30 mg by mouth daily. 05/27/19  Yes Scarboro, Audie Clear, NP  loratadine (CLARITIN) 10 MG tablet TAKE 1 TABLET BY MOUTH DAILY Patient taking differently: Take 10 mg by mouth daily. 06/23/19  Yes Scarboro, Audie Clear, NP  Melatonin 10 MG TABS Take 10 mg by mouth at bedtime. 04/12/20  Yes Jennye Boroughs, MD  metoprolol succinate (TOPROL-XL) 25 MG 24 hr tablet Take 2 tablets (50 mg total) by mouth daily. 04/12/20  Yes Jennye Boroughs, MD  Multiple Vitamins-Minerals (MULTIVITAMIN WITH MINERALS) tablet Take 1 tablet by mouth daily.   Yes [provider]  omeprazole (PRILOSEC) 40 MG capsule TAKE 1 CAPSULE BY MOUTH DAILY Patient taking differently: Take 40 mg by mouth daily. 05/27/19  Yes Kendell Bane, NP  OXYGEN Inhale into the lungs. Inhale 4 liters into lungs continously   Yes [provider]  Probiotic Product (BACID) CAPS Take 1 tablet by mouth daily.   Yes [provider]  simvastatin (ZOCOR) 10 MG tablet Take 5 mg by mouth daily.   Yes [provider]  sulfamethoxazole-trimethoprim (BACTRIM DS) 800-160 MG tablet Take 1 tablet by mouth 2 (two) times daily for 10 days.  09/12/20 09/22/20 Yes Lucrezia Starch, MD  tetrahydrozoline 0.05 % ophthalmic solution Place 1 drop into both eyes daily. For minor eye irritation   Yes [provider]  traMADol (ULTRAM) 50 MG tablet Take 25 mg by mouth every 6 (six) hours as needed for moderate pain or severe pain. 09/12/20  Yes [provider]  VICTOZA 18 MG/3ML SOPN INJECT 1.8 MG EVERY MORNING Patient taking differently: Inject 1.8 mg into the skin daily. 05/27/19  Yes Scarboro, Audie Clear, NP  Blood Glucose Monitoring Suppl (ONE TOUCH ULTRA 2) w/Device KIT by Does not apply route. Use as directed DX e11.65    [provider]  diclofenac sodium (VOLTAREN) 1 % GEL Apply 2 g topically 3 (three) times daily as needed (Pain in left leg). Patient not taking: No sig reported 04/12/20   Jennye Boroughs, MD  glucose blood (ONETOUCH ULTRA) test strip 1 each by Other route 4 (four) times daily. DX e11.65    [provider]  Insulin Pen Needle (GLOBAL EASE INJECT PEN NEEDLES) 32G X 4 MM MISC Use  As directed 12/28/18   Ronnell Freshwater, NP  Ipratropium-Albuterol (COMBIVENT RESPIMAT) 20-100 MCG/ACT AERS respimat Inhale 1 puff into the lungs every 6 (six) hours as needed for wheezing or shortness of breath. Patient not taking: No sig reported 04/12/20   Jennye Boroughs, MD  Microlet Lancets MISC by Does not apply route 4 (four) times daily. DX e11.65    [provider]  nitroGLYCERIN (NITROLINGUAL) 0.4 MG/SPRAY spray PLACE 1 SPRAY UNDER THE TONGUE EVERY 5 MINUTES FOR 3 DOSES AS NEEDED FOR CHEST PAIN. 06/19/18   Ronnell Freshwater, NP  nystatin (MYCOSTATIN/NYSTOP) powder Apply 1 application topically 3 (three) times daily. Patient not taking: No sig reported 03/21/20   Blake Divine, MD  silver sulfADIAZINE (SILVADENE) 1 % cream APPLY 1 APPLICATION TOPICALLY DAILY Patient not taking: No sig reported 02/15/19   Ronnell Freshwater, NP    Physical Exam: Vitals:   09/19/20 1632 09/19/20 1753 09/19/20 1830  09/19/20 2017  BP:  (!) 164/79 (!) 159/90 (!) 152/79  Pulse:  69 73 72  Resp:  '18 18 18  ' Temp:      TempSrc:      SpO2:  100% 100% 100%  Weight: (!) 147.9 kg     Height: '5\' 7"'  (1.702 m)       Constitutional: NAD, calm, comfortable Vitals:   09/19/20 1632 09/19/20 1753 09/19/20 1830 09/19/20 2017  BP:  (!) 164/79 (!) 159/90 (!) 152/79  Pulse:  69 73 72  Resp:  '18 18 18  ' Temp:      TempSrc:      SpO2:  100% 100% 100%  Weight: (!) 147.9 kg     Height: '5\' 7"'  (5.188 m)      Eyes: PERRL, lids and conjunctivae normal ENMT: Mucous membranes are moist. Posterior pharynx clear of any exudate or lesions.Normal dentition.  Neck: normal, supple, no masses, no thyromegaly Respiratory: clear to auscultation bilaterally, no wheezing, no crackles. Normal respiratory effort. No accessory muscle use.  Cardiovascular: Regular rate and rhythm, no murmurs / rubs / gallops. No extremity edema. 2+ pedal pulses. No carotid bruits.  Abdomen: no tenderness, no masses palpated. No hepatosplenomegaly. Bowel sounds positive.  Musculoskeletal: no clubbing / cyanosis. No joint deformity upper and lower extremities. Good ROM, no contractures. Normal muscle tone.  Skin: right foot plantar are with wound and noted redness and erythema note to be tender to touch , no odor no drainage noted.  Neurologic: CN 2-12 grossly intact. Sensation intact, DTR normal. Strength 5/5 in all 4.  Psychiatric: Normal judgment and insight. Alert and oriented x 3. Normal mood.    Labs on Admission: I have personally reviewed following labs and imaging studies  CBC: Recent Labs  Lab 09/19/20 1637  WBC 8.3  NEUTROABS 6.5  HGB 9.3*  HCT 27.7*  MCV 90.2  PLT 416   Basic Metabolic Panel: Recent Labs  Lab 09/19/20 1637  NA 135  K 4.6  CL 101  CO2 24  GLUCOSE 235*  BUN 47*  CREATININE 2.43*  CALCIUM 8.1*   GFR: Estimated Creatinine Clearance: 34.5 mL/min (A) (by C-G formula based on SCr of 2.43 mg/dL (H)). Liver  Function Tests: Recent Labs  Lab 09/19/20 1637  AST 19  ALT 18  ALKPHOS 134*  BILITOT 0.5  PROT 6.5  ALBUMIN 2.6*   No results for input(s): LIPASE, AMYLASE in the last 168 hours. No results for input(s): AMMONIA in the last 168 hours. Coagulation Profile: No results for input(s): INR, PROTIME in the last 168 hours. Cardiac Enzymes: No results for input(s): CKTOTAL, CKMB, CKMBINDEX, TROPONINI in the last 168 hours. BNP (last 3 results) No results for input(s): PROBNP in the last 8760 hours. HbA1C: No results for input(s): HGBA1C in the last 72 hours. CBG: No results for input(s): GLUCAP in the last 168 hours. Lipid Profile: No results for input(s): CHOL, HDL, LDLCALC, TRIG, CHOLHDL, LDLDIRECT in the last 72 hours. Thyroid Function Tests: No results for input(s): TSH, T4TOTAL, FREET4, T3FREE, THYROIDAB in the last 72 hours. Anemia Panel: No results for input(s): VITAMINB12, FOLATE, FERRITIN, TIBC, IRON, RETICCTPCT in the last 72 hours. Urine analysis:    Component Value Date/Time   COLORURINE YELLOW (A) 03/29/2020 0948   APPEARANCEUR CLEAR (A) 03/29/2020 0948   LABSPEC 1.010 03/29/2020 0948   PHURINE 5.0 03/29/2020 0948   GLUCOSEU NEGATIVE 03/29/2020 0948   HGBUR SMALL (A) 03/29/2020 0948   BILIRUBINUR NEGATIVE 03/29/2020 0948   KETONESUR NEGATIVE 03/29/2020 0948   PROTEINUR >=300 (A) 03/29/2020 0948   NITRITE NEGATIVE 03/29/2020 0948   LEUKOCYTESUR NEGATIVE 03/29/2020 0948    Radiological Exams on Admission: DG Foot Complete Right  Result Date: 09/19/2020 CLINICAL DATA:  65 year old female with right foot infection. EXAM: RIGHT FOOT COMPLETE - 3+ VIEW COMPARISON:  Right foot radiograph dated 09/12/2020 FINDINGS: There is no acute fracture or dislocation. The bones are osteopenic. No bone erosion or periosteal elevation. There is diffuse subcutaneous edema. No radiopaque foreign object or soft tissue gas. IMPRESSION: 1. No acute fracture or dislocation. No radiographic  evidence of acute osteomyelitis. 2. Diffuse subcutaneous edema. Electronically Signed   By: Anner Crete M.D.   On: 09/19/2020 17:46    EKG: Independently reviewed.n/a  Assessment/Plan Right foot complicated soft tissue infection in background of DMII/PVD -podiatry consult  -MRI of foot  -check esr /crp /mrsa pcr /blood cultures -continue with vanc/ctx   -await further podiatry rec  AKI -most likely related to recent use of bactrium  -hold nephrotoxic medications  -gently ivfs   type 2 diabetes mellitus -resume lantus, and aspart  -hold oral hypoglycemic for now,resume as able -monitor on finger stick and iss ,escalate therapy as needed    OSA -no longer  on cpap   COPD chronic respiratory failure on 4 L home O2 -no acute exacerbation  -continue home O2  -continue home controller medications   chronic venous insufficiency -will check abi    Hypothyroidism -continue synthroid  -check tsh   chronic diastolic CHF -no acute exacerbation  -continue bb   CAD -no acute issues  -resume ccb/imdur/statin/ prn nitro  chronic osteoarthritis -with chronic debility   HTN -continue amlodpine  Depression -continue ssri  DVT prophylaxis: lmwh  ( Code Status:DNR Family Communication:  n/a none at beside   Disposition Plan: patient  expected to be admitted greater than 2 midnights Consults called:  podiatry Dr Luana Shu  Admission status:inpatient  Clance Boll MD [facility]   If 7PM-7AM, please contact night-coverage [email or web address] Password [facility]  09/19/2020, 8:53 PM

## 2020-09-19 NOTE — ED Notes (Signed)
Patient placed on hospital bed at this time. Patient cleaned and purewick reapplied. Call light within reach.

## 2020-09-19 NOTE — ED Triage Notes (Signed)
Pt comes into the ED via EMS from [facility], pt has an abscess to the right foot and the physician at the facility does not fell comfortable I/D and sent to the ED for treatment,. A/o  72HR 16RR 98%2L [place] all the time 135/69

## 2020-09-20 ENCOUNTER — Inpatient Hospital Stay: Payer: Medicare Other

## 2020-09-20 DIAGNOSIS — L0390 Cellulitis, unspecified: Secondary | ICD-10-CM | POA: Diagnosis present

## 2020-09-20 DIAGNOSIS — F32A Depression, unspecified: Secondary | ICD-10-CM | POA: Diagnosis not present

## 2020-09-20 DIAGNOSIS — I5032 Chronic diastolic (congestive) heart failure: Secondary | ICD-10-CM | POA: Diagnosis not present

## 2020-09-20 DIAGNOSIS — E1151 Type 2 diabetes mellitus with diabetic peripheral angiopathy without gangrene: Secondary | ICD-10-CM | POA: Diagnosis not present

## 2020-09-20 DIAGNOSIS — E782 Mixed hyperlipidemia: Secondary | ICD-10-CM | POA: Diagnosis not present

## 2020-09-20 DIAGNOSIS — E1142 Type 2 diabetes mellitus with diabetic polyneuropathy: Secondary | ICD-10-CM | POA: Diagnosis not present

## 2020-09-20 DIAGNOSIS — M109 Gout, unspecified: Secondary | ICD-10-CM | POA: Diagnosis not present

## 2020-09-20 DIAGNOSIS — L02611 Cutaneous abscess of right foot: Secondary | ICD-10-CM | POA: Diagnosis not present

## 2020-09-20 DIAGNOSIS — L03115 Cellulitis of right lower limb: Secondary | ICD-10-CM | POA: Diagnosis not present

## 2020-09-20 DIAGNOSIS — Z8249 Family history of ischemic heart disease and other diseases of the circulatory system: Secondary | ICD-10-CM | POA: Diagnosis not present

## 2020-09-20 DIAGNOSIS — J449 Chronic obstructive pulmonary disease, unspecified: Secondary | ICD-10-CM | POA: Diagnosis not present

## 2020-09-20 DIAGNOSIS — L0291 Cutaneous abscess, unspecified: Secondary | ICD-10-CM | POA: Diagnosis present

## 2020-09-20 DIAGNOSIS — Z20822 Contact with and (suspected) exposure to covid-19: Secondary | ICD-10-CM | POA: Diagnosis not present

## 2020-09-20 DIAGNOSIS — N170 Acute kidney failure with tubular necrosis: Secondary | ICD-10-CM | POA: Diagnosis not present

## 2020-09-20 DIAGNOSIS — Z66 Do not resuscitate: Secondary | ICD-10-CM | POA: Diagnosis not present

## 2020-09-20 DIAGNOSIS — N1831 Chronic kidney disease, stage 3a: Secondary | ICD-10-CM | POA: Diagnosis not present

## 2020-09-20 DIAGNOSIS — E039 Hypothyroidism, unspecified: Secondary | ICD-10-CM | POA: Diagnosis not present

## 2020-09-20 DIAGNOSIS — Z6841 Body Mass Index (BMI) 40.0 and over, adult: Secondary | ICD-10-CM | POA: Diagnosis not present

## 2020-09-20 DIAGNOSIS — E11621 Type 2 diabetes mellitus with foot ulcer: Secondary | ICD-10-CM | POA: Diagnosis not present

## 2020-09-20 DIAGNOSIS — J9611 Chronic respiratory failure with hypoxia: Secondary | ICD-10-CM | POA: Diagnosis not present

## 2020-09-20 DIAGNOSIS — I130 Hypertensive heart and chronic kidney disease with heart failure and stage 1 through stage 4 chronic kidney disease, or unspecified chronic kidney disease: Secondary | ICD-10-CM | POA: Diagnosis not present

## 2020-09-20 DIAGNOSIS — Z833 Family history of diabetes mellitus: Secondary | ICD-10-CM | POA: Diagnosis not present

## 2020-09-20 DIAGNOSIS — E1122 Type 2 diabetes mellitus with diabetic chronic kidney disease: Secondary | ICD-10-CM | POA: Diagnosis not present

## 2020-09-20 DIAGNOSIS — L97411 Non-pressure chronic ulcer of right heel and midfoot limited to breakdown of skin: Secondary | ICD-10-CM | POA: Diagnosis not present

## 2020-09-20 DIAGNOSIS — Z9981 Dependence on supplemental oxygen: Secondary | ICD-10-CM | POA: Diagnosis not present

## 2020-09-20 LAB — GLUCOSE, CAPILLARY
Glucose-Capillary: 183 mg/dL — ABNORMAL HIGH (ref 70–99)
Glucose-Capillary: 300 mg/dL — ABNORMAL HIGH (ref 70–99)
Glucose-Capillary: 228 mg/dL — ABNORMAL HIGH (ref 70–99)

## 2020-09-20 LAB — C-REACTIVE PROTEIN: CRP: 1.2 mg/dL — ABNORMAL HIGH (ref <1.0)

## 2020-09-20 LAB — BASIC METABOLIC PANEL
Anion gap: 7 (ref 5–15)
BUN: 41 mg/dL — ABNORMAL HIGH (ref 8–23)
CO2: 27 mmol/L (ref 22–32)
Calcium: 8 mg/dL — ABNORMAL LOW (ref 8.9–10.3)
Chloride: 104 mmol/L (ref 98–111)
Creatinine, Ser: 2.45 mg/dL — ABNORMAL HIGH (ref 0.44–1.00)
GFR, Estimated: 21 mL/min — ABNORMAL LOW (ref >60)
Glucose, Bld: 213 mg/dL — ABNORMAL HIGH (ref 70–99)
Potassium: 4.4 mmol/L (ref 3.5–5.1)
Sodium: 138 mmol/L (ref 135–145)

## 2020-09-20 LAB — MRSA NEXT GEN BY PCR, NASAL: MRSA by PCR Next Gen: DETECTED — AB

## 2020-09-20 LAB — HIV ANTIBODY (ROUTINE TESTING W REFLEX): HIV Screen 4th Generation wRfx: NONREACTIVE

## 2020-09-20 LAB — CBC
HCT: 27.1 % — ABNORMAL LOW (ref 36.0–46.0)
Hemoglobin: 9 g/dL — ABNORMAL LOW (ref 12.0–15.0)
MCH: 30.2 pg (ref 26.0–34.0)
MCHC: 33.2 g/dL (ref 30.0–36.0)
MCV: 90.9 fL (ref 80.0–100.0)
Platelets: 137 10*3/uL — ABNORMAL LOW (ref 150–400)
RBC: 2.98 MIL/uL — ABNORMAL LOW (ref 3.87–5.11)
RDW: 13.9 % (ref 11.5–15.5)
WBC: 6.1 10*3/uL (ref 4.0–10.5)
nRBC: 0 % (ref 0.0–0.2)

## 2020-09-20 LAB — T4, FREE: Free T4: 0.76 ng/dL (ref 0.61–1.12)

## 2020-09-20 LAB — TSH: TSH: 9.283 u[IU]/mL — ABNORMAL HIGH (ref 0.350–4.500)

## 2020-09-20 LAB — SEDIMENTATION RATE: Sed Rate: 127 mm/hr — ABNORMAL HIGH (ref 0–30)

## 2020-09-20 MED ORDER — MELATONIN 5 MG PO TABS
10.0000 mg | ORAL_TABLET | Freq: Every day | ORAL | Status: DC
  Administered 2020-09-20 – 2020-09-24 (×5): 10 mg via ORAL
  Filled 2020-09-20 (×6): qty 2

## 2020-09-20 MED ORDER — PANTOPRAZOLE SODIUM 40 MG PO TBEC
40.0000 mg | DELAYED_RELEASE_TABLET | Freq: Every day | ORAL | Status: DC
  Administered 2020-09-20 – 2020-09-25 (×6): 40 mg via ORAL
  Filled 2020-09-20 (×6): qty 1

## 2020-09-20 MED ORDER — GABAPENTIN 300 MG PO CAPS
300.0000 mg | ORAL_CAPSULE | Freq: Two times a day (BID) | ORAL | Status: DC
  Administered 2020-09-20 – 2020-09-25 (×11): 300 mg via ORAL
  Filled 2020-09-20 (×11): qty 1

## 2020-09-20 MED ORDER — ONDANSETRON HCL 4 MG PO TABS: 4.0000 mg | ORAL_TABLET | Freq: Four times a day (QID) | ORAL | Status: DC | PRN

## 2020-09-20 MED ORDER — ACETAMINOPHEN 325 MG PO TABS: 650.0000 mg | ORAL_TABLET | Freq: Four times a day (QID) | ORAL | Status: DC | PRN

## 2020-09-20 MED ORDER — TRAMADOL HCL 50 MG PO TABS: 25.0000 mg | ORAL_TABLET | Freq: Four times a day (QID) | ORAL | Status: DC | PRN

## 2020-09-20 MED ORDER — DULOXETINE HCL 30 MG PO CPEP
60.0000 mg | ORAL_CAPSULE | Freq: Two times a day (BID) | ORAL | Status: DC
  Administered 2020-09-20 – 2020-09-25 (×11): 60 mg via ORAL
  Filled 2020-09-20 (×11): qty 2

## 2020-09-20 MED ORDER — HYDRALAZINE HCL 20 MG/ML IJ SOLN: 10.0000 mg | INTRAMUSCULAR | Status: DC | PRN

## 2020-09-20 MED ORDER — HEPARIN SODIUM (PORCINE) 5000 UNIT/ML IJ SOLN
5000.0000 [IU] | Freq: Three times a day (TID) | INTRAMUSCULAR | Status: DC
  Administered 2020-09-20 – 2020-09-25 (×16): 5000 [IU] via SUBCUTANEOUS
  Filled 2020-09-20 (×15): qty 1

## 2020-09-20 MED ORDER — AMLODIPINE BESYLATE 5 MG PO TABS
5.0000 mg | ORAL_TABLET | Freq: Every day | ORAL | Status: DC
  Administered 2020-09-20: 10:00:00 5 mg via ORAL
  Filled 2020-09-20: qty 1

## 2020-09-20 MED ORDER — LEVOTHYROXINE SODIUM 25 MCG PO TABS
25.0000 ug | ORAL_TABLET | Freq: Every day | ORAL | Status: DC
  Administered 2020-09-20 – 2020-09-25 (×6): 25 ug via ORAL
  Filled 2020-09-20 (×6): qty 1

## 2020-09-20 MED ORDER — VANCOMYCIN VARIABLE DOSE PER UNSTABLE RENAL FUNCTION (PHARMACIST DOSING): Status: DC

## 2020-09-20 MED ORDER — INSULIN ASPART 100 UNIT/ML IJ SOLN
0.0000 [IU] | Freq: Every day | INTRAMUSCULAR | Status: DC
Start: 2020-09-20 — End: 2020-09-25
  Administered 2020-09-20: 2 [IU] via SUBCUTANEOUS
  Administered 2020-09-21 – 2020-09-22 (×2): 3 [IU] via SUBCUTANEOUS
  Administered 2020-09-23 – 2020-09-24 (×2): 2 [IU] via SUBCUTANEOUS
  Filled 2020-09-20 (×5): qty 1

## 2020-09-20 MED ORDER — FOLIC ACID 1 MG PO TABS
1.0000 mg | ORAL_TABLET | Freq: Every day | ORAL | Status: DC
  Administered 2020-09-20 – 2020-09-25 (×6): 1 mg via ORAL
  Filled 2020-09-20 (×6): qty 1

## 2020-09-20 MED ORDER — LIRAGLUTIDE 18 MG/3ML ~~LOC~~ SOPN: 1.8000 mg | PEN_INJECTOR | Freq: Every day | SUBCUTANEOUS | Status: DC

## 2020-09-20 MED ORDER — CHLORHEXIDINE GLUCONATE CLOTH 2 % EX PADS
6.0000 | MEDICATED_PAD | Freq: Every day | CUTANEOUS | Status: AC
Start: 2020-09-20 — End: 2020-09-25
  Administered 2020-09-20 – 2020-09-24 (×4): 6 via TOPICAL

## 2020-09-20 MED ORDER — SIMVASTATIN 5 MG PO TABS
5.0000 mg | ORAL_TABLET | Freq: Every day | ORAL | Status: DC
  Administered 2020-09-20 – 2020-09-25 (×6): 5 mg via ORAL
  Filled 2020-09-20 (×6): qty 1

## 2020-09-20 MED ORDER — NITROGLYCERIN 0.4 MG SL SUBL: 0.4000 mg | SUBLINGUAL_TABLET | SUBLINGUAL | Status: DC | PRN

## 2020-09-20 MED ORDER — HYDROMORPHONE HCL 1 MG/ML IJ SOLN: 0.5000 mg | INTRAMUSCULAR | Status: DC | PRN

## 2020-09-20 MED ORDER — MUPIROCIN 2 % EX OINT
1.0000 "application " | TOPICAL_OINTMENT | Freq: Two times a day (BID) | CUTANEOUS | Status: AC
  Administered 2020-09-20 – 2020-09-24 (×10): 1 via NASAL
  Filled 2020-09-20: qty 22

## 2020-09-20 MED ORDER — ISOSORBIDE MONONITRATE ER 30 MG PO TB24
30.0000 mg | ORAL_TABLET | Freq: Every day | ORAL | Status: DC
  Administered 2020-09-20 – 2020-09-21 (×2): 30 mg via ORAL
  Filled 2020-09-20 (×2): qty 1

## 2020-09-20 MED ORDER — SODIUM CHLORIDE 0.9% FLUSH
3.0000 mL | Freq: Two times a day (BID) | INTRAVENOUS | Status: DC
  Administered 2020-09-20 – 2020-09-25 (×12): 3 mL via INTRAVENOUS

## 2020-09-20 MED ORDER — INSULIN GLARGINE-YFGN 100 UNIT/ML ~~LOC~~ SOLN
18.0000 [IU] | Freq: Every morning | SUBCUTANEOUS | Status: DC
  Administered 2020-09-20 – 2020-09-21 (×2): 18 [IU] via SUBCUTANEOUS
  Filled 2020-09-20 (×3): qty 0.18

## 2020-09-20 MED ORDER — OXYCODONE HCL 5 MG PO TABS
5.0000 mg | ORAL_TABLET | ORAL | Status: DC | PRN
Start: 2020-09-20 — End: 2020-09-25
  Administered 2020-09-20: 5 mg via ORAL
  Filled 2020-09-20: qty 1

## 2020-09-20 MED ORDER — FLUTICASONE FUROATE-VILANTEROL 200-25 MCG/INH IN AEPB
1.0000 | INHALATION_SPRAY | Freq: Every day | RESPIRATORY_TRACT | Status: DC
  Administered 2020-09-20 – 2020-09-25 (×6): 1 via RESPIRATORY_TRACT
  Filled 2020-09-20 (×2): qty 28

## 2020-09-20 MED ORDER — INSULIN GLARGINE-YFGN 100 UNIT/ML ~~LOC~~ SOLN
25.0000 [IU] | Freq: Every day | SUBCUTANEOUS | Status: DC
  Administered 2020-09-20 – 2020-09-24 (×5): 25 [IU] via SUBCUTANEOUS
  Filled 2020-09-20 (×6): qty 0.25

## 2020-09-20 MED ORDER — ACETAMINOPHEN 650 MG RE SUPP: 650.0000 mg | Freq: Four times a day (QID) | RECTAL | Status: DC | PRN

## 2020-09-20 MED ORDER — CLOPIDOGREL BISULFATE 75 MG PO TABS
75.0000 mg | ORAL_TABLET | Freq: Every day | ORAL | Status: DC
  Administered 2020-09-20 – 2020-09-25 (×6): 75 mg via ORAL
  Filled 2020-09-20 (×6): qty 1

## 2020-09-20 MED ORDER — SODIUM CHLORIDE 0.9 % IV SOLN
2.0000 g | INTRAVENOUS | Status: DC
  Administered 2020-09-20: 2 g via INTRAVENOUS
  Filled 2020-09-20: qty 20
  Filled 2020-09-20: qty 2

## 2020-09-20 MED ORDER — RISAQUAD PO CAPS
1.0000 | ORAL_CAPSULE | Freq: Every day | ORAL | Status: DC
  Administered 2020-09-20 – 2020-09-25 (×6): 1 via ORAL
  Filled 2020-09-20 (×6): qty 1

## 2020-09-20 MED ORDER — LORATADINE 10 MG PO TABS
10.0000 mg | ORAL_TABLET | Freq: Every day | ORAL | Status: DC
  Administered 2020-09-20 – 2020-09-25 (×6): 10 mg via ORAL
  Filled 2020-09-20 (×6): qty 1

## 2020-09-20 MED ORDER — AMLODIPINE BESYLATE 5 MG PO TABS
5.0000 mg | ORAL_TABLET | Freq: Once | ORAL | Status: AC
  Administered 2020-09-20: 15:00:00 5 mg via ORAL
  Filled 2020-09-20: qty 1

## 2020-09-20 MED ORDER — SODIUM CHLORIDE 0.9 % IV SOLN: INTRAVENOUS | Status: AC

## 2020-09-20 MED ORDER — VANCOMYCIN HCL 1500 MG/300ML IV SOLN
1500.0000 mg | Freq: Once | INTRAVENOUS | Status: AC
  Administered 2020-09-20: 10:00:00 1500 mg via INTRAVENOUS
  Filled 2020-09-20: qty 300

## 2020-09-20 MED ORDER — AMLODIPINE BESYLATE 10 MG PO TABS
10.0000 mg | ORAL_TABLET | Freq: Every day | ORAL | Status: DC
  Administered 2020-09-21 – 2020-09-25 (×5): 10 mg via ORAL
  Filled 2020-09-20 (×5): qty 1

## 2020-09-20 MED ORDER — ONDANSETRON HCL 4 MG/2ML IJ SOLN: 4.0000 mg | Freq: Four times a day (QID) | INTRAMUSCULAR | Status: DC | PRN

## 2020-09-20 MED ORDER — FLUTICASONE PROPIONATE 50 MCG/ACT NA SUSP
2.0000 | Freq: Every day | NASAL | Status: DC
  Administered 2020-09-20 – 2020-09-25 (×6): 2 via NASAL
  Filled 2020-09-20 (×2): qty 16

## 2020-09-20 MED ORDER — METOPROLOL SUCCINATE ER 50 MG PO TB24
50.0000 mg | ORAL_TABLET | Freq: Every day | ORAL | Status: DC
  Administered 2020-09-20 – 2020-09-23 (×4): 50 mg via ORAL
  Filled 2020-09-20 (×4): qty 1

## 2020-09-20 MED ORDER — ASPIRIN EC 81 MG PO TBEC
81.0000 mg | DELAYED_RELEASE_TABLET | Freq: Every day | ORAL | Status: DC
  Administered 2020-09-20 – 2020-09-25 (×6): 81 mg via ORAL
  Filled 2020-09-20 (×6): qty 1

## 2020-09-20 MED ORDER — INSULIN ASPART 100 UNIT/ML IJ SOLN
0.0000 [IU] | Freq: Three times a day (TID) | INTRAMUSCULAR | Status: DC
  Administered 2020-09-20: 8 [IU] via SUBCUTANEOUS
  Administered 2020-09-20 – 2020-09-21 (×3): 3 [IU] via SUBCUTANEOUS
  Administered 2020-09-21: 8 [IU] via SUBCUTANEOUS
  Administered 2020-09-22 (×3): 3 [IU] via SUBCUTANEOUS
  Administered 2020-09-23 – 2020-09-24 (×4): 5 [IU] via SUBCUTANEOUS
  Administered 2020-09-24: 09:00:00 2 [IU] via SUBCUTANEOUS
  Administered 2020-09-24 – 2020-09-25 (×2): 3 [IU] via SUBCUTANEOUS
  Administered 2020-09-25: 5 [IU] via SUBCUTANEOUS
  Filled 2020-09-20 (×16): qty 1

## 2020-09-20 MED ORDER — ADULT MULTIVITAMIN W/MINERALS CH
1.0000 | ORAL_TABLET | Freq: Every day | ORAL | Status: DC
  Administered 2020-09-20 – 2020-09-25 (×6): 1 via ORAL
  Filled 2020-09-20 (×6): qty 1

## 2020-09-20 MED ORDER — ALBUTEROL SULFATE (2.5 MG/3ML) 0.083% IN NEBU: 2.5000 mg | INHALATION_SOLUTION | RESPIRATORY_TRACT | Status: DC | PRN

## 2020-09-20 NOTE — Consult Note (Signed)
WOC Nurse Consult Note: Reason for Consult:RIght plantar foot wounds with cellulitis.  No osteomyelitis present.  IS on vancomycin.  Pending podiatry consult.  Wound type:infectious   Pressure Injury POA: NA Measurement: 6 cm x 5 cm peeling epithelium with erythema present.  2 open areas, 1 cm round , scabbed.  Wound bed: see above Drainage (amount, consistency, odor) minimal serosanguinous  no odor.  Periwound:erythema and edema. Dressing procedure/placement/frequency: Cleanse right foot wounds with NS and pat dry. Apply Xeroform gauze to open wounds.  COver with dry dressing and kerlix. Change daily.  Will not follow at this time.  Please re-consult if needed.  Maple Hudson MSN, RN, FNP-BC CWON Wound, Ostomy, Continence Nurse Pager [phone]

## 2020-09-20 NOTE — Progress Notes (Signed)
0730 pt. Off unit for scheduled MRI

## 2020-09-20 NOTE — Progress Notes (Signed)
Inpatient Diabetes Program Recommendations  AACE/ADA: New Consensus Statement on Inpatient Glycemic Control (2015)  Target Ranges:  Prepandial:   less than 140 mg/dL      Peak postprandial:   less than 180 mg/dL (1-2 hours)      Critically ill patients:  140 - 180 mg/dL   Lab Results  Component Value Date   GLUCAP 183 (H) 09/20/2020   HGBA1C 8.0 (H) 09/12/2020    Review of Glycemic Control Results for VIRDIE, PENNING (MRN [id number]) as of 09/20/2020 14:03  Ref. Range 09/20/2020 11:37  Glucose-Capillary Latest Ref Range: 70 - 99 mg/dL 878 (H)   Diabetes history: DM2 Outpatient Diabetes medications: Novolog 0-10 units tid with meals, Basaglar 18 units q AM and Basaglar 25 units q HS, Victoza 1.8 mg daily Current orders for Inpatient glycemic control:  Novolog moderate tid with meals and HS Insulin glargine-yfgn 18 units q AM and 25 units q PM  Inpatient Diabetes Program Recommendations:    Agree with current orders. Will follow.   Thanks  Beryl Meager, RN, BC-ADM Inpatient Diabetes Coordinator Pager [phone]  (8a-5p)

## 2020-09-20 NOTE — Progress Notes (Signed)
Pharmacy Antibiotic Note  Deanna Rice is a 65 y.o. female admitted on 09/19/2020 with cellulitis. Pt was in ED on 7/18 at which time pt was diagnosed with cellulitis of R foot. Pt was prescribed bactrim at discharge. Pt returned ~1 week later with progressive infection and R foot abscess. Pharmacy has been consulted for vancomycin dosing.  Plan: Pt received 1g vancomycin 7/26@1903 ; will order additional 1500 mg for a total loading dose of 2500 mg. Will hold off on maintenance dose for now as pt Scr 2.45 (baseline ~1.6) and monitor renal function daily to reassess vancomycin dosing  Height: 5\' 7"  (170.2 cm) Weight: (!) 147.9 kg (326 lb) IBW/kg (Calculated) : 61.6  Temp (24hrs), Avg:98.3 F (36.8 C), Min:98.2 F (36.8 C), Max:98.4 F (36.9 C)  Recent Labs  Lab 09/19/20 1637 09/19/20 1750 09/20/20 0455  WBC 8.3  --  6.1  CREATININE 2.43*  --  2.45*  LATICACIDVEN  --  1.0  --     Estimated Creatinine Clearance: 34.3 mL/min (A) (by C-G formula based on SCr of 2.45 mg/dL (H)).    No Known Allergies  Antimicrobials this admission: 7/26 ceftriaxone >> 7/26 vancomycin >>   Dose adjustments this admission:   Microbiology results: 7/26 BCx: NGTD 7/27 MRSA PCR: positive  Thank you for allowing pharmacy to be a part of this patient's care.  8/27 Deanna Rice 09/20/2020 8:49 AM

## 2020-09-20 NOTE — Consult Note (Signed)
PODIATRY / FOOT AND ANKLE SURGERY CONSULTATION NOTE  Requesting Physician: Dr. Priscella Mann  Reason for consult: R foot wound, cellulitis  Chief Complaint: Right foot wound/pain   HPI: Deanna Rice is a 65 y.o. female who presented to [facility] yesterday due to concerns for cellulitis and worsening toe wound to the plantar aspect of the right foot.  Patient does not recall how this wound occurred.  She did come in to the ED on 7/19 and was diagnosed with cellulitis and placed on Bactrim.  Patient was sent home after that time was instructed to follow-up with [facility] as they advised the ED on how to treat this patient.  Patient subsequently came back to the ED around a week later and was not able to be seen at [facility] because she is in the skilled nursing facility and has difficulty making appointments.  Patient was noted to have worsening redness and swelling to the right foot as well as concern for abscess.  Podiatry team was consulted today for further evaluation.  Patient notes that the redness and swelling to the plantar right foot has improved since her admission to the hospital and being on IV antibiotics.  Patient still notes tenderness to the plantar aspect of the foot.  Patient has substantial past medical history shown below.  PMHx:  Past Medical History:  Diagnosis Date   Anemia, unspecified    CHF (congestive heart failure) (HCC)    Chronic ischemic heart disease, unspecified    COPD (chronic obstructive pulmonary disease) (HCC)    Dependence on supplemental oxygen    Diabetes mellitus without complication (HCC)    Gout, unspecified    Hypertension    Mixed hyperlipidemia    Obstructive chronic bronchitis without exacerbation (HCC)    Obstructive sleep apnea syndrome    Peripheral venous insufficiency    Primary generalized hypertrophic osteoarthrosis    Primary hypothyroidism    Severe recurrent major depression with  psychotic features ([place])     Surgical Hx:  Past Surgical History:  Procedure Laterality Date   TONSILLECTOMY      FHx:  Family History  Problem Relation Age of Onset   Hypertension Mother    Diabetes Mother    CAD Mother    CAD Father    Diabetes Father    Hypertension Father    Hypertension Sister    Diabetes Sister    CAD Sister     Social History:  reports that she has quit smoking. She has never used smokeless tobacco. She reports that she does not drink alcohol and does not use drugs.  Allergies: No Known Allergies  Medications Prior to Admission  Medication Sig Dispense Refill   ADVAIR DISKUS 250-50 MCG/DOSE AEPB INHALE 1 PUFF BY MOUTH INTO THE LUNGS 2 TIMES A DAY (Patient taking differently: Inhale 1 puff into the lungs in the morning and at bedtime.) 60 each 5   allopurinol (ZYLOPRIM) 100 MG tablet TAKE 1 TABLET BY MOUTH DAILY (Patient taking differently: Take 100 mg by mouth daily.) 30 tablet 5   amLODipine (NORVASC) 5 MG tablet Take 1 tablet (5 mg total) by mouth daily.     aspirin EC 81 MG tablet Take 1 tablet (81 mg total) by mouth daily.     clopidogrel (PLAVIX) 75 MG tablet TAKE 1 TABLET BY MOUTH DAILY (Patient taking differently: Take 75 mg by mouth daily.) 30 tablet 5   DULoxetine (CYMBALTA) 60 MG capsule TAKE  ONE CAPSULE BY MOUTH TWICE A DAY (Patient taking differently: Take 60 mg by mouth 2 (two) times daily.) 60 capsule 5   fluticasone (FLONASE) 50 MCG/ACT nasal spray PLACE 2 SPRAYS INTO BOTH NOSTRILS DAILY 16 g 5   folic acid (FOLVITE) 1 MG tablet TAKE 1 TABLET BY MOUTH DAILY (Patient taking differently: Take 1 mg by mouth daily.) 30 tablet 5   insulin aspart (NOVOLOG FLEXPEN) 100 UNIT/ML FlexPen Use as directed with sliding scale maximum 63 units no more (Patient taking differently: Inject into the skin 3 (three) times daily before meals. Use as directed with sliding scale 0 - 59 notify md, 60 - 150 = 0, 151 - 199 = 2 units, 200 - 249 = 4 units, 250 - 299 = 6  units, 300 - 349 = 8 units, 350 - 399 = 10 units, 400 - 1000 notify md.) 3 pen 3   Insulin Glargine (BASAGLAR KWIKPEN) 100 UNIT/ML Inject 18 units subcutaneously in the morning and 25 units subcutaneously at bedtime     isosorbide mononitrate (IMDUR) 30 MG 24 hr tablet TAKE 1 TABLET BY MOUTH DAILY 30 tablet 5   loratadine (CLARITIN) 10 MG tablet TAKE 1 TABLET BY MOUTH DAILY (Patient taking differently: Take 10 mg by mouth daily.) 30 tablet 5   Melatonin 10 MG TABS Take 10 mg by mouth at bedtime.     metoprolol succinate (TOPROL-XL) 25 MG 24 hr tablet Take 2 tablets (50 mg total) by mouth daily.     Multiple Vitamins-Minerals (MULTIVITAMIN WITH MINERALS) tablet Take 1 tablet by mouth daily.     omeprazole (PRILOSEC) 40 MG capsule TAKE 1 CAPSULE BY MOUTH DAILY (Patient taking differently: Take 40 mg by mouth daily.) 30 capsule 5   OXYGEN Inhale into the lungs. Inhale 4 liters into lungs continously     Probiotic Product (BACID) CAPS Take 1 tablet by mouth daily.     simvastatin (ZOCOR) 10 MG tablet Take 5 mg by mouth daily.     sulfamethoxazole-trimethoprim (BACTRIM DS) 800-160 MG tablet Take 1 tablet by mouth 2 (two) times daily for 10 days. 20 tablet 0   tetrahydrozoline 0.05 % ophthalmic solution Place 1 drop into both eyes daily. For minor eye irritation     traMADol (ULTRAM) 50 MG tablet Take 25 mg by mouth every 6 (six) hours as needed for moderate pain or severe pain.     VICTOZA 18 MG/3ML SOPN INJECT 1.8 MG EVERY MORNING (Patient taking differently: Inject 1.8 mg into the skin daily.) 9 mL 5   Blood Glucose Monitoring Suppl (ONE TOUCH ULTRA 2) w/Device KIT by Does not apply route. Use as directed DX e11.65     diclofenac sodium (VOLTAREN) 1 % GEL Apply 2 g topically 3 (three) times daily as needed (Pain in left leg). (Patient not taking: No sig reported)     glucose blood (ONETOUCH ULTRA) test strip 1 each by Other route 4 (four) times daily. DX e11.65     Insulin Pen Needle (GLOBAL EASE  INJECT PEN NEEDLES) 32G X 4 MM MISC Use  As directed 200 each 5   Ipratropium-Albuterol (COMBIVENT RESPIMAT) 20-100 MCG/ACT AERS respimat Inhale 1 puff into the lungs every 6 (six) hours as needed for wheezing or shortness of breath. (Patient not taking: No sig reported)     Microlet Lancets MISC by Does not apply route 4 (four) times daily. DX e11.65     nitroGLYCERIN (NITROLINGUAL) 0.4 MG/SPRAY spray PLACE 1 SPRAY UNDER THE TONGUE EVERY  5 MINUTES FOR 3 DOSES AS NEEDED FOR CHEST PAIN. 12 g 3   nystatin (MYCOSTATIN/NYSTOP) powder Apply 1 application topically 3 (three) times daily. (Patient not taking: No sig reported) 15 g 0   silver sulfADIAZINE (SILVADENE) 1 % cream APPLY 1 APPLICATION TOPICALLY DAILY (Patient not taking: No sig reported) 400 g 3    Physical Exam: General: Alert and oriented.  No apparent distress.  Vascular: DP/PT pulses palpable bilateral.  Neuro: Light touch sensation reduced to bilateral lower extremities.  Derm: Superficial ulceration present to the right plantar medial foot (2 x 1.5 x 0.1cm)and plantar lateral foot (2 x 1cm x dermis).  Appears to not have any depth that on does appear to be like a blister almost to the plantar aspect.  The superficial layer of skin was removed and did not reveal any deeper abscess.  The small areas of wounds were probed further which did produce a lot of pain for the patient but no purulence was able to be expressed.  Corresponding erythema and edema around these areas.  No obvious palpable fluctuance.    Small ulceration that appears to be a stable type scab to the left PIPJ of the second toe.  MSK: Pain on palpation to the right foot.  Results for orders placed or performed during the hospital encounter of 09/19/20 (from the past 48 hour(s))  Comprehensive metabolic panel     Status: Abnormal   Collection Time: 09/19/20  4:37 PM  Result Value Ref Range   Sodium 135 135 - 145 mmol/L   Potassium 4.6 3.5 - 5.1 mmol/L   Chloride  101 98 - 111 mmol/L   CO2 24 22 - 32 mmol/L   Glucose, Bld 235 (H) 70 - 99 mg/dL    Comment: Glucose reference range applies only to samples taken after fasting for at least 8 hours.   BUN 47 (H) 8 - 23 mg/dL   Creatinine, Ser 2.43 (H) 0.44 - 1.00 mg/dL   Calcium 8.1 (L) 8.9 - 10.3 mg/dL   Total Protein 6.5 6.5 - 8.1 g/dL   Albumin 2.6 (L) 3.5 - 5.0 g/dL   AST 19 15 - 41 U/L   ALT 18 0 - 44 U/L   Alkaline Phosphatase 134 (H) 38 - 126 U/L   Total Bilirubin 0.5 0.3 - 1.2 mg/dL   GFR, Estimated 21 (L) >60 mL/min    Comment: (NOTE) Calculated using the CKD-EPI Creatinine Equation (2021)    Anion gap 10 5 - 15    Comment: Performed at [facility], [address] [place], [place] [address]  CBC with Differential     Status: Abnormal   Collection Time: 09/19/20  4:37 PM  Result Value Ref Range   WBC 8.3 4.0 - 10.5 K/uL   RBC 3.07 (L) 3.87 - 5.11 MIL/uL   Hemoglobin 9.3 (L) 12.0 - 15.0 g/dL   HCT 27.7 (L) 36.0 - 46.0 %   MCV 90.2 80.0 - 100.0 fL   MCH 30.3 26.0 - 34.0 pg   MCHC 33.6 30.0 - 36.0 g/dL   RDW 13.9 11.5 - 15.5 %   Platelets 177 150 - 400 K/uL   nRBC 0.0 0.0 - 0.2 %   Neutrophils Relative % 77 %   Neutro Abs 6.5 1.7 - 7.7 K/uL   Lymphocytes Relative 14 %   Lymphs Abs 1.1 0.7 - 4.0 K/uL   Monocytes Relative 5 %   Monocytes Absolute 0.4 0.1 - 1.0 K/uL   Eosinophils  Relative 2 %   Eosinophils Absolute 0.1 0.0 - 0.5 K/uL   Basophils Relative 1 %   Basophils Absolute 0.0 0.0 - 0.1 K/uL   Immature Granulocytes 1 %   Abs Immature Granulocytes 0.11 (H) 0.00 - 0.07 K/uL    Comment: Performed at [facility], [address] [place], [place] [address]  Lactic acid, plasma     Status: None   Collection Time: 09/19/20  5:50 PM  Result Value Ref Range   Lactic Acid, Venous 1.0 0.5 - 1.9 mmol/L    Comment: Performed at [facility], [address] [place], [place] [address]  Blood culture (single)     Status: None (Preliminary result)    Collection Time: 09/19/20  5:50 PM   Specimen: BLOOD  Result Value Ref Range   Specimen Description BLOOD BLOOD RIGHT FOREARM    Special Requests      BOTTLES DRAWN AEROBIC AND ANAEROBIC Blood Culture results may not be optimal due to an excessive volume of blood received in culture bottles   Culture      NO GROWTH < 24 HOURS Performed at [facility], [address] [place], [place] [address]    Report Status PENDING   Resp Panel by RT-PCR (Flu A&B, Covid) Nasopharyngeal Swab     Status: None   Collection Time: 09/19/20  5:50 PM   Specimen: Nasopharyngeal Swab; Nasopharyngeal(NP) swabs in vial transport medium  Result Value Ref Range   SARS Coronavirus 2 by RT PCR NEGATIVE NEGATIVE    Comment: (NOTE) SARS-CoV-2 target nucleic acids are NOT DETECTED.  The SARS-CoV-2 RNA is generally detectable in upper respiratory specimens during the acute phase of infection. The lowest concentration of SARS-CoV-2 viral copies this assay can detect is 138 copies/mL. A negative result does not preclude SARS-Cov-2 infection and should not be used as the sole basis for treatment or other patient management decisions. A negative result may occur with  improper specimen collection/handling, submission of specimen other than nasopharyngeal swab, presence of viral mutation(s) within the areas targeted by this assay, and inadequate number of viral copies(<138 copies/mL). A negative result must be combined with clinical observations, patient history, and epidemiological information. The expected result is Negative.  Fact Sheet for Patients:  EntrepreneurPulse.com.au  Fact Sheet for Healthcare Providers:  IncredibleEmployment.be  This test is no t yet approved or cleared by the [place] FDA and  has been authorized for detection and/or diagnosis of SARS-CoV-2 by FDA under an Emergency Use Authorization (EUA). This EUA will remain  in effect (meaning  this test can be used) for the duration of the COVID-19 declaration under Section 564(b)(1) of the Act, 21 U.S.C.section 360bbb-3(b)(1), unless the authorization is terminated  or revoked sooner.       Influenza A by PCR NEGATIVE NEGATIVE   Influenza B by PCR NEGATIVE NEGATIVE    Comment: (NOTE) The Xpert Xpress SARS-CoV-2/FLU/RSV plus assay is intended as an aid in the diagnosis of influenza from Nasopharyngeal swab specimens and should not be used as a sole basis for treatment. Nasal washings and aspirates are unacceptable for Xpert Xpress SARS-CoV-2/FLU/RSV testing.  Fact Sheet for Patients: EntrepreneurPulse.com.au  Fact Sheet for Healthcare Providers: IncredibleEmployment.be  This test is not yet approved or cleared by the [place] FDA and has been authorized for detection and/or diagnosis of SARS-CoV-2 by FDA under an Emergency Use Authorization (EUA). This EUA will remain in effect (meaning this test can be used) for the duration of the  COVID-19 declaration under Section 564(b)(1) of the Act, 21 U.S.C. section 360bbb-3(b)(1), unless the authorization is terminated or revoked.  Performed at [facility], [address] [place], [place] [address]   MRSA Next Gen by PCR, Nasal     Status: Abnormal   Collection Time: 09/20/20  4:30 AM   Specimen: Nasal Mucosa; Nasal Swab  Result Value Ref Range   MRSA by PCR Next Gen DETECTED (A) NOT DETECTED    Comment: RESULT CALLED TO, READ BACK BY AND VERIFIED WITH: KAYLAN FREEMIRE'@[id number]'  ON 09/20/20 BY HKP (NOTE) The GeneXpert MRSA Assay (FDA approved for NASAL specimens only), is one component of a comprehensive MRSA colonization surveillance program. It is not intended to diagnose MRSA infection nor to guide or monitor treatment for MRSA infections. Test performance is not FDA approved in patients less than 39 years old. Performed at [facility], [address]  [place], [place] [address]   TSH     Status: Abnormal   Collection Time: 09/20/20  4:55 AM  Result Value Ref Range   TSH 9.283 (H) 0.350 - 4.500 uIU/mL    Comment: Performed by a 3rd Generation assay with a functional sensitivity of <=0.01 uIU/mL. Performed at [facility], [address] [place], [place] [address]   CBC     Status: Abnormal   Collection Time: 09/20/20  4:55 AM  Result Value Ref Range   WBC 6.1 4.0 - 10.5 K/uL   RBC 2.98 (L) 3.87 - 5.11 MIL/uL   Hemoglobin 9.0 (L) 12.0 - 15.0 g/dL   HCT 27.1 (L) 36.0 - 46.0 %   MCV 90.9 80.0 - 100.0 fL   MCH 30.2 26.0 - 34.0 pg   MCHC 33.2 30.0 - 36.0 g/dL   RDW 13.9 11.5 - 15.5 %   Platelets 137 (L) 150 - 400 K/uL   nRBC 0.0 0.0 - 0.2 %    Comment: Performed at [facility], [address], [place], [place] [address]  Basic metabolic panel     Status: Abnormal   Collection Time: 09/20/20  4:55 AM  Result Value Ref Range   Sodium 138 135 - 145 mmol/L   Potassium 4.4 3.5 - 5.1 mmol/L   Chloride 104 98 - 111 mmol/L   CO2 27 22 - 32 mmol/L   Glucose, Bld 213 (H) 70 - 99 mg/dL    Comment: Glucose reference range applies only to samples taken after fasting for at least 8 hours.   BUN 41 (H) 8 - 23 mg/dL   Creatinine, Ser 2.45 (H) 0.44 - 1.00 mg/dL   Calcium 8.0 (L) 8.9 - 10.3 mg/dL   GFR, Estimated 21 (L) >60 mL/min    Comment: (NOTE) Calculated using the CKD-EPI Creatinine Equation (2021)    Anion gap 7 5 - 15    Comment: Performed at [facility], [address] [place], [place] [address]  Sedimentation rate     Status: Abnormal   Collection Time: 09/20/20  4:55 AM  Result Value Ref Range   Sed Rate 127 (H) 0 - 30 mm/hr    Comment: Performed at [facility], [address] [place], [place] [address]  C-reactive protein     Status: Abnormal   Collection Time: 09/20/20  4:55 AM  Result Value Ref Range   CRP 1.2 (H) <1.0 mg/dL    Comment: Performed at [facility], [place]  [address], [place], [place] [address]  Glucose, capillary  Status: Abnormal   Collection Time: 09/20/20 11:37 AM  Result Value Ref Range   Glucose-Capillary 183 (H) 70 - 99 mg/dL    Comment: Glucose reference range applies only to samples taken after fasting for at least 8 hours.   MR FOOT RIGHT WO CONTRAST  Result Date: 09/20/2020 CLINICAL DATA:  Foot pain and swelling 3 months. Diabetic. EXAM: MRI OF THE RIGHT FOREFOOT WITHOUT CONTRAST TECHNIQUE: Multiplanar, multisequence MR imaging of the right foot was performed. No intravenous contrast was administered. COMPARISON:  Radiograph 09/19/2020 FINDINGS: Examination is quite limited due to patient motion. Subcutaneous soft tissue swelling/edema/fluid consistent with cellulitis. I do not see a discrete fluid collection to suggest a drainable soft tissue abscess. Mild changes of myositis but no evidence of pyomyositis. No MR findings suspicious for septic arthritis or osteomyelitis. IMPRESSION: 1. Very limited examination due to patient motion. 2. Cellulitis and myositis but no evidence of drainable soft tissue abscess or pyomyositis. 3. No MR findings for septic arthritis or osteomyelitis. Electronically Signed   By: Marijo Sanes M.D.   On: 09/20/2020 08:26   DG Foot Complete Right  Result Date: 09/19/2020 CLINICAL DATA:  65 year old female with right foot infection. EXAM: RIGHT FOOT COMPLETE - 3+ VIEW COMPARISON:  Right foot radiograph dated 09/12/2020 FINDINGS: There is no acute fracture or dislocation. The bones are osteopenic. No bone erosion or periosteal elevation. There is diffuse subcutaneous edema. No radiopaque foreign object or soft tissue gas. IMPRESSION: 1. No acute fracture or dislocation. No radiographic evidence of acute osteomyelitis. 2. Diffuse subcutaneous edema. Electronically Signed   By: Anner Crete M.D.   On: 09/19/2020 17:46    Blood pressure (!) 181/76, pulse 68, temperature 97.8 F (36.6 C), resp. rate 16, height '5\' 7"'  (1.702  m), weight (!) 147.9 kg, SpO2 100 %.  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 -X-ray imaging reviewed discussed with patient detail.  MRI imaging also reviewed discussed with patient detail.  No deep infection noted.   -Patient appears to have cellulitis which is likely due to the superficial ulcerations present.  No deep abscess present or fluctuance. -Wound debridements performed as described below without incident.  Patient tolerated well. -Applied Betadine paint to the areas followed by dry sterile dressing.  Dressing orders placed. -Wound culture taken and sent off. -Appreciate medicine recommendations for IV antibiotic therapy. -Surgical shoe ordered.  Would recommend at this time patient tried to ambulate with heel contact only for short distances and try to stay off the foot is much as possible. -Will continue to follow patient over the next couple of days to ensure that redness and swelling is improving.  No indication to perform incision and drainage at this time.  Debridment of ulcer: Location: Right plantar medial midfoot Pre-debridement measurement: 2 x 1.5 x 0.1 cm Post-debridement measurement: Same Tissue removed:   Fibrous/hyperkeratotic tissue, biofilm Ulcer was debrided sharply with combination of tissue nippers and scalpel blade into the subcutaneous tissue, 100% excisional  Debridment of ulcer: Location: Right plantar lateral midfoot Pre-debridement measurement: 2 x 1 cm by dermis Post-debridement measurement: Same Tissue removed:   Hyperkeratotic tissue, biofilm Ulcer was debrided sharply with combination of tissue nippers and scalpel blade into the dermis, 100% excisional   Caroline More, DPM 09/20/2020, 1:35 PM

## 2020-09-20 NOTE — Progress Notes (Signed)
PROGRESS NOTE    Deanna Rice  QQV:[id number] DOB: January 16, 1956 DOA: 09/19/2020 PCP: Lyndon Code, MD   Brief Narrative:   65 year old woman with medical history significant for morbid obesity-BMI of 60, type 2 diabetes mellitus, OSA,COPD/chronic respiratory failure on 4 L home O2, chronic venous insufficiency, hypothyroidism, chronic diastolic CHF, chronic osteoarthritis, depression who 5 months ago was transitioned from ALF to SNF due to chronic debility and falls. Patient also has interim history of ED visit on 7/19 at which time patient was diagnosed with  cellulitis of there right foot with what appears to be small puncture like wound on plantar surface. Patient was prescribed bactrim and discharged back to SNF.Patient now returns to ed  one week later with progressive infection and right foot abscess. Patient denies any injury to foot, and noted foot is very tender with clear/yellow drainage. She denies any fever/chills/ n/v/diarrhea / chest pain or sob.   MRI completed which did demonstrate plantar aspect abscess with associated soft tissue swelling.  Study was limited by motion artifact but no obvious evidence of osteomyelitis.   Assessment & Plan:   Active Problems:   Cellulitis  Right plantar diabetic foot ulcer with associated cellulitis and abscess Patient failed outpatient antibiotics MRI limited by motion artifact but negative for bone involvement Plan: Will initiate vancomycin and ceftriaxone Follow cultures taken on admission Monitor vitals and fever curve Podiatry consulted on admission, await further recommendations WOC consult, pictures in chart  AKI on CKD stage IIIa. Baseline creatinine appears to be 1.4 Presented with creatinine 2.43 ATN versus prerenal azotemia Plan: Intravenous fluids Avoid nephrotoxins Discontinue Toradol Monitor kidney function  Insulin-dependent type 2 diabetes mellitus Recent hemoglobin A1c 8.0 Plan: Lantus 25 units nightly, 18  units every morning Moderate sliding scale 3 times daily AC Nightly coverage Carb modified diet Diabetes coordinator consult  Essential hypertension, uncontrolled Continue home regimen of amlodipine, Imdur, metoprolol Uptitrate as necessary to achieve BP control Ensure pain control As needed IV hydralazine  COPD Chronic respiratory failure on 2L home oxygen Not currently exacerbated Continue bronchodilators and oxygen  Chronic venous insufficiency Likely secondary to uncontrolled diabetes  Hypothyroidism Patient was not on thyroid replacement TSH greater than 9 Started 25 mcg Synthroid Check free T4  Obstructive sleep apnea Not on CPAP for unclear reasons  Chronic diastolic congestive heart failure Not acutely exacerbated Continue beta-blockade  Coronary artery disease Continue calcium channel blocker, Imdur, high intensity statin, as needed nitroglycerin  Osteoarthritis with chronic debility Pain control Therapy evaluations  Depression PTA SSRI  Morbid obesity BMI 51 Complicates care and prognosis Dietitian consult      DVT prophylaxis: SQ Lovenox Code Status: Full Family Communication: None today Disposition Plan: Status is: Inpatient  Remains inpatient appropriate because:Inpatient level of care appropriate due to severity of illness  Dispo: The patient is from: Home              Anticipated d/c is to: Home              Patient currently is not medically stable to d/c.   Difficult to place patient No  Plantar foot ulcer without evidence of acute osteomyelitis.  On IV antibiotics and pain control.  Pending podiatry consult.  Disposition plan pending.     Level of care: Med-Surg  Consultants:  Podiatry  Procedures:  None  Antimicrobials:  Vancomycin Ceftriaxone   Subjective: Patient seen and examined.  Reports pain is moderate.  Pain localized to right foot.  No other complaints.  Objective: Vitals:   09/20/20 0316 09/20/20 0415  09/20/20 0723 09/20/20 1135  BP: (!) 180/73 (!) 167/55 (!) 183/93 (!) 181/76  Pulse: 67 68 76 68  Resp: (!) Temp: 98.2 F (36.8 C) 98.2 F (36.8 C) 98.2 F (36.8 C) 97.8 F (36.6 C)  TempSrc: Oral     SpO2: 99% 100% 98% 100%  Weight:      Height:        Intake/Output Summary (Last 24 hours) at 09/20/2020 1204 Last data filed at 09/20/2020 1005 Gross per 24 hour  Intake 1516.88 ml  Output 900 ml  Net 616.88 ml   Filed Weights   09/19/20 1632  Weight: (!) 147.9 kg    Examination:  General exam: Mild distress due to pain Respiratory system: Decreased at bases.  Lungs clear.  Normal work of breathing.  2 L Cardiovascular system: S1-S2, regular rate and rhythm, no murmurs, no pedal edema  gastrointestinal system: Obese, nontender, nondistended, normal bowel sounds Central nervous system: Alert and oriented. No focal neurological deficits. Extremities: Diffusely decreased power bilateral upper and lower extremities Skin: 6 x 5 cm ulcer dorsum right foot, associated cellulitic change Psychiatry: Judgement and insight appear normal. Mood & affect appropriate.     Data Reviewed: I have personally reviewed following labs and imaging studies  CBC: Recent Labs  Lab 09/19/20 1637 09/20/20 0455  WBC 8.3 6.1  NEUTROABS 6.5  --   HGB 9.3* 9.0*  HCT 27.7* 27.1*  MCV 90.2 90.9  PLT 177 137*   Basic Metabolic Panel: Recent Labs  Lab 09/19/20 1637 09/20/20 0455  NA 135 138  K 4.6 4.4  CL 101 104  CO2 24 27  GLUCOSE 235* 213*  BUN 47* 41*  CREATININE 2.43* 2.45*  CALCIUM 8.1* 8.0*   GFR: Estimated Creatinine Clearance: 34.3 mL/min (A) (by C-G formula based on SCr of 2.45 mg/dL (H)). Liver Function Tests: Recent Labs  Lab 09/19/20 1637  AST 19  ALT 18  ALKPHOS 134*  BILITOT 0.5  PROT 6.5  ALBUMIN 2.6*   No results for input(s): LIPASE, AMYLASE in the last 168 hours. No results for input(s): AMMONIA in the last 168 hours. Coagulation Profile: No  results for input(s): INR, PROTIME in the last 168 hours. Cardiac Enzymes: No results for input(s): CKTOTAL, CKMB, CKMBINDEX, TROPONINI in the last 168 hours. BNP (last 3 results) No results for input(s): PROBNP in the last 8760 hours. HbA1C: No results for input(s): HGBA1C in the last 72 hours. CBG: Recent Labs  Lab 09/20/20 1137  GLUCAP 183*   Lipid Profile: No results for input(s): CHOL, HDL, LDLCALC, TRIG, CHOLHDL, LDLDIRECT in the last 72 hours. Thyroid Function Tests: Recent Labs    09/20/20 0455  TSH 9.283*   Anemia Panel: No results for input(s): VITAMINB12, FOLATE, FERRITIN, TIBC, IRON, RETICCTPCT in the last 72 hours. Sepsis Labs: Recent Labs  Lab 09/19/20 1750  LATICACIDVEN 1.0    Recent Results (from the past 240 hour(s))  Blood culture (single)     Status: None   Collection Time: 09/12/20  6:06 PM   Specimen: BLOOD  Result Value Ref Range Status   Specimen Description BLOOD BLOOD RIGHT FOREARM  Final   Special Requests   Final    BOTTLES DRAWN AEROBIC AND ANAEROBIC Blood Culture adequate volume   Culture   Final    NO GROWTH 6 DAYS Performed at [facility], [address], [place], [place] [address]  Report Status 09/18/2020 FINAL  Final  Aerobic/Anaerobic Culture w Gram Stain (surgical/deep wound)     Status: None   Collection Time: 09/12/20  8:06 PM   Specimen: Foot  Result Value Ref Range Status   Specimen Description   Final    FOOT RIGHT FOOT Performed at [facility], [address], [place], [place] [address]    Special Requests   Final    NONE Performed at [facility], [address] [place], [place] [address]    Gram Stain NO WBC SEEN NO ORGANISMS SEEN   Final   Culture   Final    No growth aerobically or anaerobically. Performed at [facility], [address], [place], [place] [address]    Report Status 09/18/2020 FINAL  Final  Blood culture (single)     Status: None (Preliminary result)    Collection Time: 09/19/20  5:50 PM   Specimen: BLOOD  Result Value Ref Range Status   Specimen Description BLOOD BLOOD RIGHT FOREARM  Final   Special Requests   Final    BOTTLES DRAWN AEROBIC AND ANAEROBIC Blood Culture results may not be optimal due to an excessive volume of blood received in culture bottles   Culture   Final    NO GROWTH < 24 HOURS Performed at [facility], [address], [place], [place] [address]    Report Status PENDING  Incomplete  Resp Panel by RT-PCR (Flu A&B, Covid) Nasopharyngeal Swab     Status: None   Collection Time: 09/19/20  5:50 PM   Specimen: Nasopharyngeal Swab; Nasopharyngeal(NP) swabs in vial transport medium  Result Value Ref Range Status   SARS Coronavirus 2 by RT PCR NEGATIVE NEGATIVE Final    Comment: (NOTE) SARS-CoV-2 target nucleic acids are NOT DETECTED.  The SARS-CoV-2 RNA is generally detectable in upper respiratory specimens during the acute phase of infection. The lowest concentration of SARS-CoV-2 viral copies this assay can detect is 138 copies/mL. A negative result does not preclude SARS-Cov-2 infection and should not be used as the sole basis for treatment or other patient management decisions. A negative result may occur with  improper specimen collection/handling, submission of specimen other than nasopharyngeal swab, presence of viral mutation(s) within the areas targeted by this assay, and inadequate number of viral copies(<138 copies/mL). A negative result must be combined with clinical observations, patient history, and epidemiological information. The expected result is Negative.  Fact Sheet for Patients:  BloggerCourse.com  Fact Sheet for Healthcare Providers:  SeriousBroker.it  This test is no t yet approved or cleared by the [place] FDA and  has been authorized for detection and/or diagnosis of SARS-CoV-2 by FDA under an Emergency Use Authorization  (EUA). This EUA will remain  in effect (meaning this test can be used) for the duration of the COVID-19 declaration under Section 564(b)(1) of the Act, 21 U.S.C.section 360bbb-3(b)(1), unless the authorization is terminated  or revoked sooner.       Influenza A by PCR NEGATIVE NEGATIVE Final   Influenza B by PCR NEGATIVE NEGATIVE Final    Comment: (NOTE) The Xpert Xpress SARS-CoV-2/FLU/RSV plus assay is intended as an aid in the diagnosis of influenza from Nasopharyngeal swab specimens and should not be used as a sole basis for treatment. Nasal washings and aspirates are unacceptable for Xpert Xpress SARS-CoV-2/FLU/RSV testing.  Fact Sheet for Patients: BloggerCourse.com  Fact Sheet for Healthcare Providers: SeriousBroker.it  This test is not yet approved or cleared by the [place]  States FDA and has been authorized for detection and/or diagnosis of SARS-CoV-2 by FDA under an Emergency Use Authorization (EUA). This EUA will remain in effect (meaning this test can be used) for the duration of the COVID-19 declaration under Section 564(b)(1) of the Act, 21 U.S.C. section 360bbb-3(b)(1), unless the authorization is terminated or revoked.  Performed at [facility], [address] [place], [place] [address]   MRSA Next Gen by PCR, Nasal     Status: Abnormal   Collection Time: 09/20/20  4:30 AM   Specimen: Nasal Mucosa; Nasal Swab  Result Value Ref Range Status   MRSA by PCR Next Gen DETECTED (A) NOT DETECTED Final    Comment: RESULT CALLED TO, READ BACK BY AND VERIFIED WITH: KAYLAN FREEMIRE@[id number]  ON 09/20/20 BY HKP (NOTE) The GeneXpert MRSA Assay (FDA approved for NASAL specimens only), is one component of a comprehensive MRSA colonization surveillance program. It is not intended to diagnose MRSA infection nor to guide or monitor treatment for MRSA infections. Test performance is not FDA approved in patients less than  39 years old. Performed at [facility], [address], [place], [place] [address]          Radiology Studies: MR FOOT RIGHT WO CONTRAST  Result Date: 09/20/2020 CLINICAL DATA:  Foot pain and swelling 3 months. Diabetic. EXAM: MRI OF THE RIGHT FOREFOOT WITHOUT CONTRAST TECHNIQUE: Multiplanar, multisequence MR imaging of the right foot was performed. No intravenous contrast was administered. COMPARISON:  Radiograph 09/19/2020 FINDINGS: Examination is quite limited due to patient motion. Subcutaneous soft tissue swelling/edema/fluid consistent with cellulitis. I do not see a discrete fluid collection to suggest a drainable soft tissue abscess. Mild changes of myositis but no evidence of pyomyositis. No MR findings suspicious for septic arthritis or osteomyelitis. IMPRESSION: 1. Very limited examination due to patient motion. 2. Cellulitis and myositis but no evidence of drainable soft tissue abscess or pyomyositis. 3. No MR findings for septic arthritis or osteomyelitis. Electronically Signed   By: Rudie Meyer M.D.   On: 09/20/2020 08:26   DG Foot Complete Right  Result Date: 09/19/2020 CLINICAL DATA:  65 year old female with right foot infection. EXAM: RIGHT FOOT COMPLETE - 3+ VIEW COMPARISON:  Right foot radiograph dated 09/12/2020 FINDINGS: There is no acute fracture or dislocation. The bones are osteopenic. No bone erosion or periosteal elevation. There is diffuse subcutaneous edema. No radiopaque foreign object or soft tissue gas. IMPRESSION: 1. No acute fracture or dislocation. No radiographic evidence of acute osteomyelitis. 2. Diffuse subcutaneous edema. Electronically Signed   By: Elgie Collard M.D.   On: 09/19/2020 17:46        Scheduled Meds:  acidophilus  1 capsule Oral Daily   amLODipine  5 mg Oral Daily   aspirin EC  81 mg Oral Daily   Chlorhexidine Gluconate Cloth  6 each Topical Q0600   clopidogrel  75 mg Oral Daily   DULoxetine  60 mg Oral BID    fluticasone  2 spray Each Nare Daily   fluticasone furoate-vilanterol  1 puff Inhalation Daily   folic acid  1 mg Oral Daily   gabapentin  300 mg Oral BID   heparin  5,000 Units Subcutaneous Q8H   insulin aspart  0-15 Units Subcutaneous TID WC   insulin aspart  0-5 Units Subcutaneous QHS   insulin glargine-yfgn  18 Units Subcutaneous q morning   insulin glargine-yfgn  25 Units Subcutaneous QHS   isosorbide mononitrate  30 mg Oral Daily   loratadine  10 mg Oral Daily   melatonin  10 mg Oral QHS   metoprolol succinate  50 mg Oral Daily   multivitamin with minerals  1 tablet Oral Daily   mupirocin ointment  1 application Nasal BID   pantoprazole  40 mg Oral Daily   simvastatin  5 mg Oral Daily   sodium chloride flush  3 mL Intravenous Q12H   vancomycin variable dose per unstable renal function (pharmacist dosing)   Does not apply See admin instructions   Continuous Infusions:  cefTRIAXone (ROCEPHIN)  IV     vancomycin 1,500 mg (09/20/20 1024)     LOS: 0 days    Time spent: 35 minutes    Tresa MooreSudheer B Preston Garabedian, MD [facility] Pager [phone]  If 7PM-7AM, please contact night-coverage 09/20/2020, 12:04 PM

## 2020-09-21 DIAGNOSIS — E1151 Type 2 diabetes mellitus with diabetic peripheral angiopathy without gangrene: Secondary | ICD-10-CM | POA: Diagnosis not present

## 2020-09-21 DIAGNOSIS — L0291 Cutaneous abscess, unspecified: Secondary | ICD-10-CM | POA: Diagnosis not present

## 2020-09-21 DIAGNOSIS — L03115 Cellulitis of right lower limb: Secondary | ICD-10-CM | POA: Diagnosis not present

## 2020-09-21 LAB — CREATININE, SERUM
Creatinine, Ser: 2.13 mg/dL — ABNORMAL HIGH (ref 0.44–1.00)
GFR, Estimated: 25 mL/min — ABNORMAL LOW (ref >60)

## 2020-09-21 LAB — GLUCOSE, CAPILLARY
Glucose-Capillary: 187 mg/dL — ABNORMAL HIGH (ref 70–99)
Glucose-Capillary: 223 mg/dL — ABNORMAL HIGH (ref 70–99)
Glucose-Capillary: 266 mg/dL — ABNORMAL HIGH (ref 70–99)
Glucose-Capillary: 252 mg/dL — ABNORMAL HIGH (ref 70–99)

## 2020-09-21 LAB — VANCOMYCIN, RANDOM: Vancomycin Rm: 11

## 2020-09-21 MED ORDER — INSULIN ASPART 100 UNIT/ML IJ SOLN
4.0000 [IU] | Freq: Three times a day (TID) | INTRAMUSCULAR | Status: DC
  Administered 2020-09-21 – 2020-09-23 (×6): 4 [IU] via SUBCUTANEOUS
  Filled 2020-09-21 (×6): qty 1

## 2020-09-21 MED ORDER — SODIUM CHLORIDE 0.9 % IV SOLN: INTRAVENOUS | Status: DC

## 2020-09-21 MED ORDER — ISOSORBIDE MONONITRATE ER 30 MG PO TB24
60.0000 mg | ORAL_TABLET | Freq: Every day | ORAL | Status: DC
  Administered 2020-09-22 – 2020-09-25 (×4): 60 mg via ORAL
  Filled 2020-09-21 (×4): qty 2

## 2020-09-21 MED ORDER — ENSURE MAX PROTEIN PO LIQD
11.0000 [oz_av] | Freq: Every day | ORAL | Status: DC
  Administered 2020-09-21 – 2020-09-25 (×5): 11 [oz_av] via ORAL
  Filled 2020-09-21: qty 330

## 2020-09-21 MED ORDER — JUVEN PO PACK
1.0000 | PACK | Freq: Two times a day (BID) | ORAL | Status: DC
  Administered 2020-09-22 – 2020-09-25 (×6): 1 via ORAL

## 2020-09-21 MED ORDER — VANCOMYCIN HCL 2000 MG/400ML IV SOLN
2000.0000 mg | Freq: Once | INTRAVENOUS | Status: AC
  Administered 2020-09-21: 2000 mg via INTRAVENOUS
  Filled 2020-09-21: qty 400

## 2020-09-21 NOTE — Progress Notes (Signed)
Inpatient Diabetes Program Recommendations  AACE/ADA: New Consensus Statement on Inpatient Glycemic Control   Target Ranges:  Prepandial:   less than 140 mg/dL      Peak postprandial:   less than 180 mg/dL (1-2 hours)      Critically ill patients:  140 - 180 mg/dL   Results for Deanna Rice, Deanna Rice (MRN [id number]) as of 09/21/2020 09:48  Ref. Range 09/20/2020 11:37 09/20/2020 16:38 09/20/2020 21:14 09/21/2020 07:51  Glucose-Capillary Latest Ref Range: 70 - 99 mg/dL 340 (H) 370 (H) 964 (H) 187 (H)   Review of Glycemic Control  Diabetes history: DM2 Outpatient Diabetes medications: Basaglar 18 units QAM, 25 units QHS, Novolog 0-10 units TID with meals, Victoza 1.8 mg daily Current orders for Inpatient glycemic control: Semglee 18 units QAM, Semglee 25 units QHS, Novolog 0-15 units TID with meals, Novolog 0-5 units QHS  Inpatient Diabetes Program Recommendations:    Insulin: Please consider ordering Novolog 4 units TID with meals for meal coverage if patient eats at least 50% of meals.  Thanks, Orlando Penner, RN, MSN, CDE Diabetes Coordinator Inpatient Diabetes Program [phone] (Team Pager from 8am to 5pm)

## 2020-09-21 NOTE — Progress Notes (Signed)
Daily Progress Note   Subjective  - * No surgery found *  Follow-up right foot abscess.  Patient states foot is doing better today  Objective Vitals:   09/20/20 2115 09/21/20 0011 09/21/20 0520 09/21/20 0742  BP: (!) 157/71 (!) 149/55 (!) 151/61 (!) 169/85  Pulse: 65 72 66 77  Resp: 16 16 17 16   Temp: 98.2 F (36.8 C) 97.7 F (36.5 C) 98 F (36.7 C) 97.9 F (36.6 C)  TempSrc:  Oral    SpO2: 98% 98% 100% 100%  Weight:      Height:        Physical Exam: Minimal erythema.  Superficial ulceration noted to the medial aspect of the arch area.  No purulent drainage at this time.  Laboratory CBC    Component Value Date/Time   WBC 6.1 09/20/2020 0455   HGB 9.0 (L) 09/20/2020 0455   HGB 9.7 (L) 09/22/2009 1005   HCT 27.1 (L) 09/20/2020 0455   HCT 28.1 (L) 09/22/2009 1005   PLT 137 (L) 09/20/2020 0455   PLT 192 09/22/2009 1005    BMET    Component Value Date/Time   NA 138 09/20/2020 0455   NA 137 02/03/2012 0912   NA 135 04/18/2009 1406   K 4.4 09/20/2020 0455   K 4.1 02/03/2012 0912   K 4.3 04/18/2009 1406   CL 104 09/20/2020 0455   CL 101 02/03/2012 0912   CL 97 (L) 04/18/2009 1406   CO2 27 09/20/2020 0455   CO2 29 02/03/2012 0912   CO2 31 04/18/2009 1406   GLUCOSE 213 (H) 09/20/2020 0455   GLUCOSE 369 (H) 02/03/2012 0912   GLUCOSE 302 (H) 04/18/2009 1406   BUN 41 (H) 09/20/2020 0455   BUN 32 (H) 02/03/2012 0912   BUN 18 04/18/2009 1406   CREATININE 2.13 (H) 09/21/2020 0451   CREATININE 1.12 02/03/2012 0912   CREATININE 0.7 04/18/2009 1406   CALCIUM 8.0 (L) 09/20/2020 0455   CALCIUM 8.2 (L) 02/03/2012 0912   CALCIUM 8.9 04/18/2009 1406   GFRNONAA 25 (L) 09/21/2020 0451   GFRNONAA 54 (L) 02/03/2012 0912   GFRAA 36 (L) 07/04/2019 0150   GFRAA >60 02/03/2012 0912    Assessment/Planning  Ulceration to the medial aspect of the right foot.  This looks to be stable.  Minimal surrounding erythema.  Recommend oral antibiotics upon discharge. Wound culture so  far no growth.  No organisms seen.  If patient discharged prior to culture identification recommend Bactrim versus doxycycline for MRSA coverage. Patient to follow-up with podiatry in outpatient clinic in 2 to 3 weeks. Recommend continue with padded foam border with Betadine applied to wound 3-4 times a week.  14/10/2011 A  09/21/2020, 12:50 PM

## 2020-09-21 NOTE — TOC Progression Note (Signed)
Transition of Care [facility]) - Progression Note    Patient Details  Name: LEATHA ROHNER MRN: [id number] Date of Birth: [birth year]  Transition of Care [facility]) CM/SW Contact  Caryn Section, RN Phone Number: 09/21/2020, 9:54 AM  Clinical Narrative:   Patient is a resident of [facility], and she plans to return there on discharge.  Patient has no concerns about discharge at this time.  Tonya from [facility] notified, TOC will contact [facility] on discharge.  TOC contact information given to patient, TOC will follow to discharge.    Expected Discharge Plan: Skilled Nursing Facility Barriers to Discharge: Continued Medical Work up  Expected Discharge Plan and Services Expected Discharge Plan: Skilled Nursing Facility   Discharge Planning Services: CM Consult Post Acute Care Choice: Skilled Nursing Facility (Resident of [facility]) Living arrangements for the past 2 months: Skilled Nursing Facility                                       Social Determinants of Health (SDOH) Interventions    Readmission Risk Interventions Readmission Risk Prevention Plan 09/21/2020 03/31/2020  Transportation Screening Complete Complete  Palliative Care Screening - Not Applicable  Medication Review (RN Care Manager) Complete Complete  PCP or Specialist appointment within 3-5 days of discharge Complete -  HRI or Home Care Consult Complete -  SW Recovery Care/Counseling Consult Not Complete -  SW Consult Not Complete Comments RNCM assigned -  Palliative Care Screening Not Applicable -  Skilled Nursing Facility Complete -  Some recent data might be hidden

## 2020-09-21 NOTE — Evaluation (Signed)
Occupational Therapy Evaluation Patient Details Name: Deanna Rice MRN: [id number] DOB: 1955-06-12 Today's Date: 09/21/2020    History of Present Illness 65 y.o. female admitted with abcess of R foot; now s/p debridement of ulcers. Medical history significant for morbid obesity, type 2 diabetes mellitus, OSA,COPD/chronic respiratory failure on 2 L home O2, chronic venous insufficiency, hypothyroidism, chronic diastolic CHF, chronic osteoarthritis, depression who 5 months ago was transitioned from ALF to SNF due to chronic debility and falls.   Clinical Impression   Chart reviewed, Pt greeted in bed, agreeable to OT evaluation. Pt is A&Ox4, no pain noted at rest or during activity. Pt is a transfer from SNF due to R foot wound. Pt reports she is able to bathe, dress, groom with MOD I; Assistance is provided for laundry, cooking, cleaning, etc. She uses a rolling walker at baseline. Pt requires MAX A for LB dressing on this date, MOD A for toileting hygiene. Pt required vcs throughout functional mobility- SPT to bed side chair for appropriate WBing on RLE. Pt presents with decreased functional mobility, safe completion of ADLs compared to baseline and would benefit from skilled OT to address functional deficits. OT recommends discharge back to SNF.     Follow Up Recommendations  SNF    Equipment Recommendations   (per next level of care)    Recommendations for Other Services       Precautions / Restrictions Precautions Precautions: Fall Required Braces or Orthoses: Other Brace Other Brace: post-op shoe RLE Restrictions Weight Bearing Restrictions: Yes RLE Weight Bearing:  (weight bearing through heel only) Other Position/Activity Restrictions: per podiatry note ambulate with heel contact only      Mobility Bed Mobility Overal bed mobility: Needs Assistance Bed Mobility: Rolling;Supine to Sit Rolling: Supervision   Supine to sit: Min guard     General bed mobility comments:  Performed with OT prior to PT entering.    Transfers Overall transfer level: Needs assistance Equipment used: Rolling walker (2 wheeled) Transfers: Sit to/from [employer] Sit to Stand: Min guard Stand pivot transfers: Min guard       General transfer comment: with RW, CGA for precautions to RLE    Balance Overall balance assessment: Needs assistance Sitting-balance support: No upper extremity supported Sitting balance-Leahy Scale: Good     Standing balance support: Bilateral upper extremity supported Standing balance-Leahy Scale: Fair Standing balance comment: RW, CGA to steady, weight shift towards left and WB through R heel only                           ADL either performed or assessed with clinical judgement   ADL Overall ADL's : Needs assistance/impaired Eating/Feeding: Set up   Grooming: Set up;Sitting           Upper Body Dressing : Minimal assistance   Lower Body Dressing: Maximal assistance Lower Body Dressing Details (indicate cue type and reason): in bed, decreased ROM for reach to B feet Toilet Transfer: Min Pension scheme manager Details (indicate cue type and reason): simulated to chair                 Vision Patient Visual Report: No change from baseline              Pertinent Vitals/Pain Pain Assessment: No/denies pain     Hand Dominance Right   Extremity/Trunk Assessment Upper Extremity Assessment Upper Extremity Assessment: Overall WFL for tasks assessed   Lower Extremity Assessment Lower  Extremity Assessment: Generalized weakness   Cervical / Trunk Assessment Cervical / Trunk Assessment: Kyphotic   Communication Communication Communication: No difficulties   Cognition Arousal/Alertness: Awake/alert Behavior During Therapy: WFL for tasks assessed/performed Overall Cognitive Status: Within Functional Limits for tasks assessed                                 General Comments: Pt  A&O x4   General Comments               Home Living Family/patient expects to be discharged to:: Skilled nursing facility                                 Additional Comments: Pt has assistance with IADLs- cooking, cleaning, laundry      Prior Functioning/Environment Level of Independence: Needs assistance  Gait / Transfers Assistance Needed: Mod I with ambulation/transfers ADL's / Homemaking Assistance Needed: Assistance with IADLs   Comments: Uses RW for all ambulation        OT Problem List: Decreased strength;Decreased activity tolerance;Impaired balance (sitting and/or standing)      OT Treatment/Interventions: Self-care/ADL training;Balance training;Therapeutic exercise;Therapeutic activities;Patient/family education    OT Goals(Current goals can be found in the care plan section) Acute Rehab OT Goals Patient Stated Goal: to get stronger OT Goal Formulation: With patient Time For Goal Achievement: 10/05/20 Potential to Achieve Goals: Good  OT Frequency: Min 1X/week   Barriers to D/C:            Co-evaluation   Reason for Co-Treatment: For patient/therapist safety PT goals addressed during session: Mobility/safety with mobility;Balance OT goals addressed during session: ADL's and self-care      AM-PAC OT "6 Clicks" Daily Activity     Outcome Measure Help from another person eating meals?: None Help from another person taking care of personal grooming?: A Little Help from another person toileting, which includes using toliet, bedpan, or urinal?: A Lot Help from another person bathing (including washing, rinsing, drying)?: A Little Help from another person to put on and taking off regular upper body clothing?: A Little Help from another person to put on and taking off regular lower body clothing?: A Lot 6 Click Score: 17   End of Session Equipment Utilized During Treatment: Gait belt;Rolling walker Nurse Communication: Weight bearing  status;Other (comment) (encourage to use BSC)  Activity Tolerance: Patient tolerated treatment well Patient left: in chair;with call bell/phone within reach;with chair alarm set  OT Visit Diagnosis: Unsteadiness on feet (R26.81);Muscle weakness (generalized) (M62.81)                Time: 7425-9563 OT Time Calculation (min): 38 min Charges:  OT General Charges $OT Visit: 1 Visit OT Evaluation $OT Eval Moderate Complexity: 1 Mod OT Treatments $Self Care/Home Management : 8-22 mins Oleta Mouse, OTD OTR/L  09/21/20, 11:29 AM

## 2020-09-21 NOTE — Progress Notes (Signed)
Initial Nutrition Assessment  DOCUMENTATION CODES:  Morbid obesity  INTERVENTION:  Continue current diet as ordered, encourage PO intake Ensure Enlive po 1x/d, each supplement provides 350 kcal and 20 grams of protein  1 packet Juven BID, each packet provides 95 calories, 2.5 grams of protein (collagen), and 9.8 grams of carbohydrate (3 grams sugar); also contains 7 grams of L-arginine and L-glutamine, 300 mg vitamin C, 15 mg vitamin E, 1.2 mcg vitamin B-12, 9.5 mg zinc, 200 mg calcium, and 1.5 g  Calcium Beta-hydroxy-Beta-methylbutyrate to support wound healing  NUTRITION DIAGNOSIS:  Increased nutrient needs related to wound healing as evidenced by estimated needs.  GOAL:  Patient will meet greater than or equal to 90% of their needs  MONITOR:  PO intake, Supplement acceptance, Skin, Weight trends  REASON FOR ASSESSMENT:  Consult Assessment of nutrition requirement/status  ASSESSMENT:  65 y.o. female who presents from her long-term care facility [facility]) with concerns of an abscess to the plantar aspect of the right foot. Has been on antibiotics outpatient with minimal improvement. PMH of COPD, DM type 2, HTN, and CHF  Workup in ED suggestive of cellulitis. Admitted for IV antibiotic administration. Podiatry did perform bedside debridement, but does not feel I&D is warranted at this time.   Pt resting in bed at the time of visit, eating lunch. Reports a good appetite and that she eats well. Noted hx of stated weights, but a significant weight loss in the last 4 months if accurate (15.5%). Pt does endorse weight loss, states she was not trying to lose weight, but thinks it was the result of being on scheduled meal times at her facility and not snacking all day.   Nutritionally Relevant Medications: Scheduled Meds:  acidophilus  1 capsule Oral Daily   folic acid  1 mg Oral Daily   insulin aspart  0-15 Units Subcutaneous TID WC   insulin aspart  0-5 Units Subcutaneous  QHS   insulin aspart  4 Units Subcutaneous TID WC   insulin glargine-yfgn  18 Units Subcutaneous q morning   insulin glargine-yfgn  25 Units Subcutaneous QHS   multivitamin with minerals  1 tablet Oral Daily   pantoprazole  40 mg Oral Daily   simvastatin  5 mg Oral Daily   vancomycin (pharmacist dosing)   Does not apply See admin instructions   PRN Meds: ondansetron  Labs Reviewed: SBG ranges from 183-300 mg/dL over the last 24 hours HgbA1c 8.0% (7/19) Creatinine 2.13  NUTRITION - FOCUSED PHYSICAL EXAM: Flowsheet Row Most Recent Value  Orbital Region No depletion  Upper Arm Region No depletion  Thoracic and Lumbar Region No depletion  Buccal Region No depletion  Temple Region No depletion  Clavicle Bone Region No depletion  Clavicle and Acromion Bone Region No depletion  Scapular Bone Region No depletion  Dorsal Hand No depletion  Patellar Region No depletion  Anterior Thigh Region No depletion  Posterior Calf Region No depletion  Edema (RD Assessment) Mild  Hair Reviewed  Eyes Reviewed  Mouth Reviewed  Skin Reviewed  Nails Reviewed   Diet Order:   Diet Order             Diet heart healthy/carb modified Room service appropriate? Yes; Fluid consistency: Thin  Diet effective now                   EDUCATION NEEDS:  No education needs have been identified at this time  Skin:  Skin Assessment: Reviewed RN Assessment (open wound/cellulitis to  the right foot)  Last BM:  7/26  Height:  Ht Readings from Last 1 Encounters:  09/19/20 5\' 7"  (1.702 m)    Weight:  Wt Readings from Last 1 Encounters:  09/19/20 (!) 147.9 kg    Ideal Body Weight:  61.4 kg  BMI:  Body mass index is 51.06 kg/m.  Estimated Nutritional Needs:  Kcal:  2000-2100 kcal/d Protein:  100g/d Fluid:  2L/d   09/21/20, RD, LDN Clinical Dietitian Pager on [employer]

## 2020-09-21 NOTE — Evaluation (Signed)
Physical Therapy Evaluation Patient Details Name: Deanna Rice MRN: [id number] DOB: November 14, 1955 Today's Date: 09/21/2020   History of Present Illness  65 y.o. female admitted with abcess of R foot; now s/p debridement of ulcers. Medical history significant for morbid obesity, type 2 diabetes mellitus, OSA,COPD/chronic respiratory failure on 2 L home O2, chronic venous insufficiency, hypothyroidism, chronic diastolic CHF, chronic osteoarthritis, depression who 5 months ago was transitioned from ALF to SNF due to chronic debility and falls.  Clinical Impression  Pt received sitting up on EOB with OT, pt and therapist requiring additional O2 line. PT retrieved prior to entering for eval. OT obtained history. STS performed using RW, improved performance with both hands are on RW prior to standing. Post-op shoe not yet in room; minimal standin gmobility performed due to this. VC throughout on weightbearing through heel only on RLE. Pt maintained well during stand and stand-pivot transfer. PT called service desk after session to have post-op shoe delivered to room. Notified RN of precautions and shoe being delivered. Impaired balance and overall strength were noted. Pt tolerated standing bouts well. PT rec pt return to SNF upon d/c. Pt would benefit from skilled PT to address above deficits and promote optimal return to PLOF.     Follow Up Recommendations SNF    Equipment Recommendations  None recommended by PT    Recommendations for Other Services       Precautions / Restrictions Precautions Precautions: Fall Required Braces or Orthoses: Other Brace Other Brace: post-op shoe RLE Restrictions Weight Bearing Restrictions: Yes RLE Weight Bearing:  (weight bearing through heel only)      Mobility  Bed Mobility               General bed mobility comments: Performed with OT prior to PT entering.    Transfers Overall transfer level: Needs assistance Equipment used: Rolling walker (2  wheeled) Transfers: Sit to/from [employer] Sit to Stand: Min guard Stand pivot transfers: Min guard       General transfer comment: Using RW, PT blocked RW to steady it. 2 trials attempted, improved performace to stand with both hands starting on RW. CGA to steady throughout. VC to maintain weight bearing through heel.  Ambulation/Gait             General Gait Details: No performed due to no post-op shoe in room  Stairs            Wheelchair Mobility    Modified Rankin (Stroke Patients Only)       Balance Overall balance assessment: Mild deficits observed, not formally tested;Needs assistance Sitting-balance support: No upper extremity supported Sitting balance-Leahy Scale: Good     Standing balance support: Bilateral upper extremity supported Standing balance-Leahy Scale: Fair Standing balance comment: RW, CGA to steady, weight shift towards left and WB through R heel only                             Pertinent Vitals/Pain Pain Assessment: No/denies pain    Home Living Family/patient expects to be discharged to:: Skilled nursing facility                      Prior Function Level of Independence: Needs assistance   Gait / Transfers Assistance Needed: Mod I with ambulation/transfers  ADL's / Homemaking Assistance Needed: Assistance with IADLs  Comments: Uses RW for all ambulation     Hand Dominance  Dominant Hand: Right    Extremity/Trunk Assessment   Upper Extremity Assessment Upper Extremity Assessment: Overall WFL for tasks assessed    Lower Extremity Assessment Lower Extremity Assessment: Generalized weakness (function for STS and transfers)    Cervical / Trunk Assessment Cervical / Trunk Assessment: Kyphotic  Communication   Communication: No difficulties  Cognition Arousal/Alertness: Awake/alert Behavior During Therapy: WFL for tasks assessed/performed Overall Cognitive Status: Within Functional  Limits for tasks assessed                                        General Comments      Exercises Other Exercises Other Exercises: Education on PT role, POC, WB restrictions, use of post-op shoe, limited mobility/ambulation rec per podiatry, safety. Stand pivot transfer EOB>recliner using RW. CGA for additional standing bout x 2 minutes using RW as OT assisted with hygiene/clean-up after noticing urine in bed.   Assessment/Plan    PT Assessment Patient needs continued PT services  PT Problem List Decreased mobility;Decreased safety awareness;Decreased activity tolerance;Decreased skin integrity;Decreased balance       PT Treatment Interventions DME instruction;Therapeutic activities;Gait training;Therapeutic exercise;Patient/family education;Balance training;Functional mobility training;Neuromuscular re-education    PT Goals (Current goals can be found in the Care Plan section)  Acute Rehab PT Goals Patient Stated Goal: to return to walking like before PT Goal Formulation: With patient Time For Goal Achievement: 10/05/20 Potential to Achieve Goals: Fair    Frequency Min 2X/week   Barriers to discharge   N/A    Co-evaluation PT/OT/SLP Co-Evaluation/Treatment: Yes Reason for Co-Treatment: For patient/therapist safety PT goals addressed during session: Mobility/safety with mobility;Balance OT goals addressed during session: ADL's and self-care       AM-PAC PT "6 Clicks" Mobility  Outcome Measure Help needed turning from your back to your side while in a flat bed without using bedrails?: A Lot Help needed moving from lying on your back to sitting on the side of a flat bed without using bedrails?: A Lot Help needed moving to and from a bed to a chair (including a wheelchair)?: A Little Help needed standing up from a chair using your arms (e.g., wheelchair or bedside chair)?: A Little Help needed to walk in hospital room?: A Little Help needed climbing 3-5  steps with a railing? : A Lot 6 Click Score: 15    End of Session Equipment Utilized During Treatment: Gait belt Activity Tolerance: Patient tolerated treatment well;No increased pain Patient left: in chair;with call bell/phone within reach;with chair alarm set Nurse Communication: Mobility status;Other (comment) (OT communicated need for bari [facility]) PT Visit Diagnosis: Unsteadiness on feet (R26.81);History of falling (Z91.81)    Time: 3474-2595 PT Time Calculation (min) (ACUTE ONLY): 17 min   Charges:     PT Treatments $Therapeutic Activity: 8-22 mins        Basilia Jumbo PT, DPT 09/21/20 11:00 AM [phone]   Lavenia Atlas 09/21/2020, 10:59 AM

## 2020-09-21 NOTE — Plan of Care (Signed)
  Problem: Education: Goal: Knowledge of General Education information will improve Description: Including pain rating scale, medication(s)/side effects and non-pharmacologic comfort measures Outcome: Progressing  No complaints of pain at this time, eager to learn,

## 2020-09-21 NOTE — Progress Notes (Signed)
PROGRESS NOTE    Deanna Rice  VQM:[id number] DOB: 1955-07-23 DOA: 09/19/2020 PCP: Lyndon Code, MD   Brief Narrative:   65 year old woman with medical history significant for morbid obesity-BMI of 60, type 2 diabetes mellitus, OSA,COPD/chronic respiratory failure on 4 L home O2, chronic venous insufficiency, hypothyroidism, chronic diastolic CHF, chronic osteoarthritis, depression who 5 months ago was transitioned from ALF to SNF due to chronic debility and falls. Patient also has interim history of ED visit on 7/19 at which time patient was diagnosed with  cellulitis of there right foot with what appears to be small puncture like wound on plantar surface. Patient was prescribed bactrim and discharged back to SNF.Patient now returns to ed  one week later with progressive infection and right foot abscess. Patient denies any injury to foot, and noted foot is very tender with clear/yellow drainage. She denies any fever/chills/ n/v/diarrhea / chest pain or sob.   MRI completed which did demonstrate plantar aspect abscess with associated soft tissue swelling.  Study was limited by motion artifact but no obvious evidence of osteomyelitis.  Seen in consultation by podiatry.  No surgical intervention warranted.  Bedside debridement done.  No bedside I&D indicated at this time.  Patient is improving symptomatically.   Assessment & Plan:   Active Problems:   Cellulitis  Right plantar diabetic foot ulcer with associated cellulitis and abscess Patient failed outpatient antibiotics MRI limited by motion artifact but negative for bone involvement Podiatry consulted, no surgical intervention warranted Podiatry did do bedside debridement Plan: DC ceftriaxone Continue vancomycin Follow cultures taken on admission, no growth to date Monitor vitals and fever curve Appreciate W OC consult Therapy evaluations  AKI on CKD stage IIIa. Baseline creatinine appears to be 1.4 Presented with creatinine  2.43 ATN versus prerenal azotemia Slowly improving over interval Plan: Continue IVF Avoid nephrotoxins Monitor kidney function  Insulin-dependent type 2 diabetes mellitus Recent hemoglobin A1c 8.0 Plan: Lantus 25 units nightly, 18 units every morning Lispro 4 units 3 times daily with meals Moderate sliding scale 3 times daily AC Nightly coverage Carb modified diet Diabetes coordinator consult  Essential hypertension, uncontrolled Continue home regimen of amlodipine, Imdur, metoprolol Uptitrate as necessary to achieve BP control Ensure pain control As needed IV hydralazine  COPD Chronic respiratory failure on 2L home oxygen Not currently exacerbated Continue bronchodilators and oxygen  Chronic venous insufficiency Likely secondary to uncontrolled diabetes  Hypothyroidism Patient was not on thyroid replacement TSH greater than 9 Free T4 0.76 Plan: Continue Synthroid 25 mcg daily  Obstructive sleep apnea Not on CPAP for unclear reasons  Chronic diastolic congestive heart failure Not acutely exacerbated Continue beta-blockade  Coronary artery disease Continue calcium channel blocker, Imdur, high intensity statin, as needed nitroglycerin  Osteoarthritis with chronic debility Pain control Therapy evaluations  Depression PTA SSRI  Morbid obesity BMI 51 Complicates care and prognosis Dietitian consult      DVT prophylaxis: SQ Lovenox Code Status: Full Family Communication: None today Disposition Plan: Status is: Inpatient  Remains inpatient appropriate because:Inpatient level of care appropriate due to severity of illness  Dispo: The patient is from: Home              Anticipated d/c is to: Home              Patient currently is not medically stable to d/c.   Difficult to place patient No  Plantar foot ulcer without evidence of acute osteomyelitis.  On IV antibiotics and pain control.  Podiatry consulted,  recommendations appreciated.  Anticipated  disposition to skilled nursing facility.     Level of care: Med-Surg  Consultants:  Podiatry  Procedures:  None  Antimicrobials:  Vancomycin Ceftriaxone   Subjective: Patient seen and examined.  Improved pain control.  No complaints  Objective: Vitals:   09/20/20 2115 09/21/20 0011 09/21/20 0520 09/21/20 0742  BP: (!) 157/71 (!) 149/55 (!) 151/61 (!) 169/85  Pulse: 65 72 66 77  Resp: 16 16 17 16   Temp: 98.2 F (36.8 C) 97.7 F (36.5 C) 98 F (36.7 C) 97.9 F (36.6 C)  TempSrc:  Oral    SpO2: 98% 98% 100% 100%  Weight:      Height:        Intake/Output Summary (Last 24 hours) at 09/21/2020 1159 Last data filed at 09/21/2020 0950 Gross per 24 hour  Intake 110 ml  Output --  Net 110 ml   Filed Weights   09/19/20 1632  Weight: (!) 147.9 kg    Examination:  General exam: No acute distress Respiratory system: Lungs clear.  Normal work of breathing.  2 L Cardiovascular system: S1-S2, regular rate and rhythm, no murmurs, no pedal edema  gastrointestinal system: Obese, nontender, nondistended, normal bowel sounds Central nervous system: Alert and oriented. No focal neurological deficits. Extremities: 5 out of 5 power symmetrically Skin: 6 x 5 cm ulcer dorsum right foot, associated cellulitic change Psychiatry: Judgement and insight appear normal. Mood & affect appropriate.     Data Reviewed: I have personally reviewed following labs and imaging studies  CBC: Recent Labs  Lab 09/19/20 1637 09/20/20 0455  WBC 8.3 6.1  NEUTROABS 6.5  --   HGB 9.3* 9.0*  HCT 27.7* 27.1*  MCV 90.2 90.9  PLT 177 137*   Basic Metabolic Panel: Recent Labs  Lab 09/19/20 1637 09/20/20 0455 09/21/20 0451  NA 135 138  --   K 4.6 4.4  --   CL 101 104  --   CO2 24 27  --   GLUCOSE 235* 213*  --   BUN 47* 41*  --   CREATININE 2.43* 2.45* 2.13*  CALCIUM 8.1* 8.0*  --    GFR: Estimated Creatinine Clearance: 39.4 mL/min (A) (by C-G formula based on SCr of 2.13 mg/dL  (H)). Liver Function Tests: Recent Labs  Lab 09/19/20 1637  AST 19  ALT 18  ALKPHOS 134*  BILITOT 0.5  PROT 6.5  ALBUMIN 2.6*   No results for input(s): LIPASE, AMYLASE in the last 168 hours. No results for input(s): AMMONIA in the last 168 hours. Coagulation Profile: No results for input(s): INR, PROTIME in the last 168 hours. Cardiac Enzymes: No results for input(s): CKTOTAL, CKMB, CKMBINDEX, TROPONINI in the last 168 hours. BNP (last 3 results) No results for input(s): PROBNP in the last 8760 hours. HbA1C: No results for input(s): HGBA1C in the last 72 hours. CBG: Recent Labs  Lab 09/20/20 1137 09/20/20 1638 09/20/20 2114 09/21/20 0751  GLUCAP 183* 300* 228* 187*   Lipid Profile: No results for input(s): CHOL, HDL, LDLCALC, TRIG, CHOLHDL, LDLDIRECT in the last 72 hours. Thyroid Function Tests: Recent Labs    09/20/20 0455  TSH 9.283*  FREET4 0.76   Anemia Panel: No results for input(s): VITAMINB12, FOLATE, FERRITIN, TIBC, IRON, RETICCTPCT in the last 72 hours. Sepsis Labs: Recent Labs  Lab 09/19/20 1750  LATICACIDVEN 1.0    Recent Results (from the past 240 hour(s))  Blood culture (single)     Status: None   Collection Time:  09/12/20  6:06 PM   Specimen: BLOOD  Result Value Ref Range Status   Specimen Description BLOOD BLOOD RIGHT FOREARM  Final   Special Requests   Final    BOTTLES DRAWN AEROBIC AND ANAEROBIC Blood Culture adequate volume   Culture   Final    NO GROWTH 6 DAYS Performed at [facility], [address], [place], [place] [address]    Report Status 09/18/2020 FINAL  Final  Aerobic/Anaerobic Culture w Gram Stain (surgical/deep wound)     Status: None   Collection Time: 09/12/20  8:06 PM   Specimen: Foot  Result Value Ref Range Status   Specimen Description   Final    FOOT RIGHT FOOT Performed at [facility], [address], [place], [place] [address]    Special Requests   Final    NONE Performed at [facility], [address] [place], [place] [address]    Gram Stain NO WBC SEEN NO ORGANISMS SEEN   Final   Culture   Final    No growth aerobically or anaerobically. Performed at [facility], [address], [place], [place] [address]    Report Status 09/18/2020 FINAL  Final  Blood culture (single)     Status: None (Preliminary result)   Collection Time: 09/19/20  5:50 PM   Specimen: BLOOD  Result Value Ref Range Status   Specimen Description BLOOD BLOOD RIGHT FOREARM  Final   Special Requests   Final    BOTTLES DRAWN AEROBIC AND ANAEROBIC Blood Culture results may not be optimal due to an excessive volume of blood received in culture bottles   Culture   Final    NO GROWTH 2 DAYS Performed at [facility], [address], [place], [place] [address]    Report Status PENDING  Incomplete  Resp Panel by RT-PCR (Flu A&B, Covid) Nasopharyngeal Swab     Status: None   Collection Time: 09/19/20  5:50 PM   Specimen: Nasopharyngeal Swab; Nasopharyngeal(NP) swabs in vial transport medium  Result Value Ref Range Status   SARS Coronavirus 2 by RT PCR NEGATIVE NEGATIVE Final    Comment: (NOTE) SARS-CoV-2 target nucleic acids are NOT DETECTED.  The SARS-CoV-2 RNA is generally detectable in upper respiratory specimens during the acute phase of infection. The lowest concentration of SARS-CoV-2 viral copies this assay can detect is 138 copies/mL. A negative result does not preclude SARS-Cov-2 infection and should not be used as the sole basis for treatment or other patient management decisions. A negative result may occur with  improper specimen collection/handling, submission of specimen other than nasopharyngeal swab, presence of viral mutation(s) within the areas targeted by this assay, and inadequate number of viral copies(<138 copies/mL). A negative result must be combined with clinical observations, patient history, and epidemiological information. The expected  result is Negative.  Fact Sheet for Patients:  BloggerCourse.com  Fact Sheet for Healthcare Providers:  SeriousBroker.it  This test is no t yet approved or cleared by the [place] FDA and  has been authorized for detection and/or diagnosis of SARS-CoV-2 by FDA under an Emergency Use Authorization (EUA). This EUA will remain  in effect (meaning this test can be used) for the duration of the COVID-19 declaration under Section 564(b)(1) of the Act, 21 U.S.C.section 360bbb-3(b)(1), unless the authorization is terminated  or revoked sooner.       Influenza A by PCR NEGATIVE NEGATIVE Final   Influenza B by PCR NEGATIVE NEGATIVE Final  Comment: (NOTE) The Xpert Xpress SARS-CoV-2/FLU/RSV plus assay is intended as an aid in the diagnosis of influenza from Nasopharyngeal swab specimens and should not be used as a sole basis for treatment. Nasal washings and aspirates are unacceptable for Xpert Xpress SARS-CoV-2/FLU/RSV testing.  Fact Sheet for Patients: BloggerCourse.com  Fact Sheet for Healthcare Providers: SeriousBroker.it  This test is not yet approved or cleared by the [place] FDA and has been authorized for detection and/or diagnosis of SARS-CoV-2 by FDA under an Emergency Use Authorization (EUA). This EUA will remain in effect (meaning this test can be used) for the duration of the COVID-19 declaration under Section 564(b)(1) of the Act, 21 U.S.C. section 360bbb-3(b)(1), unless the authorization is terminated or revoked.  Performed at [facility], [address] [place], [place] [address]   MRSA Next Gen by PCR, Nasal     Status: Abnormal   Collection Time: 09/20/20  4:30 AM   Specimen: Nasal Mucosa; Nasal Swab  Result Value Ref Range Status   MRSA by PCR Next Gen DETECTED (A) NOT DETECTED Final    Comment: RESULT CALLED TO, READ BACK BY AND VERIFIED  WITH: KAYLAN FREEMIRE@[id number]  ON 09/20/20 BY HKP (NOTE) The GeneXpert MRSA Assay (FDA approved for NASAL specimens only), is one component of a comprehensive MRSA colonization surveillance program. It is not intended to diagnose MRSA infection nor to guide or monitor treatment for MRSA infections. Test performance is not FDA approved in patients less than 83 years old. Performed at [facility], [address] [place], [place] [address]   Aerobic/Anaerobic Culture w Gram Stain (surgical/deep wound)     Status: None (Preliminary result)   Collection Time: 09/20/20  3:11 PM   Specimen: Wound  Result Value Ref Range Status   Specimen Description   Final    WOUND Performed at [facility], [address], [place], [place] [address]    Special Requests   Final    FOOT Performed at [facility], [address] [place], [place] [address]    Gram Stain NO WBC SEEN NO ORGANISMS SEEN   Final   Culture   Final    NO GROWTH < 24 HOURS Performed at [facility], [address], [place], [place] [address]    Report Status PENDING  Incomplete         Radiology Studies: MR FOOT RIGHT WO CONTRAST  Result Date: 09/20/2020 CLINICAL DATA:  Foot pain and swelling 3 months. Diabetic. EXAM: MRI OF THE RIGHT FOREFOOT WITHOUT CONTRAST TECHNIQUE: Multiplanar, multisequence MR imaging of the right foot was performed. No intravenous contrast was administered. COMPARISON:  Radiograph 09/19/2020 FINDINGS: Examination is quite limited due to patient motion. Subcutaneous soft tissue swelling/edema/fluid consistent with cellulitis. I do not see a discrete fluid collection to suggest a drainable soft tissue abscess. Mild changes of myositis but no evidence of pyomyositis. No MR findings suspicious for septic arthritis or osteomyelitis. IMPRESSION: 1. Very limited examination due to patient motion. 2. Cellulitis and myositis but no evidence of drainable soft tissue abscess or  pyomyositis. 3. No MR findings for septic arthritis or osteomyelitis. Electronically Signed   By: Rudie Meyer M.D.   On: 09/20/2020 08:26   DG Foot Complete Right  Result Date: 09/19/2020 CLINICAL DATA:  65 year old female with right foot infection. EXAM: RIGHT FOOT COMPLETE - 3+ VIEW COMPARISON:  Right foot radiograph dated 09/12/2020 FINDINGS: There is no acute fracture or dislocation. The bones are osteopenic. No bone erosion  or periosteal elevation. There is diffuse subcutaneous edema. No radiopaque foreign object or soft tissue gas. IMPRESSION: 1. No acute fracture or dislocation. No radiographic evidence of acute osteomyelitis. 2. Diffuse subcutaneous edema. Electronically Signed   By: Arash  RadpElgie Collardarvar M.D.   On: 09/19/2020 17:46        Scheduled Meds:  acidophilus  1 capsule Oral Daily   amLODipine  10 mg Oral Daily   aspirin EC  81 mg Oral Daily   Chlorhexidine Gluconate Cloth  6 each Topical Q0600   clopidogrel  75 mg Oral Daily   DULoxetine  60 mg Oral BID   fluticasone  2 spray Each Nare Daily   fluticasone furoate-vilanterol  1 puff Inhalation Daily   folic acid  1 mg Oral Daily   gabapentin  300 mg Oral BID   heparin  5,000 Units Subcutaneous Q8H   insulin aspart  0-15 Units Subcutaneous TID WC   insulin aspart  0-5 Units Subcutaneous QHS   insulin aspart  4 Units Subcutaneous TID WC   insulin glargine-yfgn  18 Units Subcutaneous q morning   insulin glargine-yfgn  25 Units Subcutaneous QHS   isosorbide mononitrate  30 mg Oral Daily   levothyroxine  25 mcg Oral Q0600   loratadine  10 mg Oral Daily   melatonin  10 mg Oral QHS   metoprolol succinate  50 mg Oral Daily   multivitamin with minerals  1 tablet Oral Daily   mupirocin ointment  1 application Nasal BID   pantoprazole  40 mg Oral Daily   simvastatin  5 mg Oral Daily   sodium chloride flush  3 mL Intravenous Q12H   vancomycin variable dose per unstable renal function (pharmacist dosing)   Does not apply See  admin instructions   Continuous Infusions:     LOS: 1 day    Time spent: 25 minutes    Tresa MooreSudheer B Maryanne Huneycutt, MD [facility] Pager [phone]  If 7PM-7AM, please contact night-coverage 09/21/2020, 11:59 AM

## 2020-09-22 DIAGNOSIS — L0291 Cutaneous abscess, unspecified: Secondary | ICD-10-CM | POA: Diagnosis not present

## 2020-09-22 DIAGNOSIS — E1151 Type 2 diabetes mellitus with diabetic peripheral angiopathy without gangrene: Secondary | ICD-10-CM | POA: Diagnosis not present

## 2020-09-22 DIAGNOSIS — L03115 Cellulitis of right lower limb: Secondary | ICD-10-CM | POA: Diagnosis not present

## 2020-09-22 LAB — GLUCOSE, CAPILLARY
Glucose-Capillary: 166 mg/dL — ABNORMAL HIGH (ref 70–99)
Glucose-Capillary: 188 mg/dL — ABNORMAL HIGH (ref 70–99)
Glucose-Capillary: 198 mg/dL — ABNORMAL HIGH (ref 70–99)
Glucose-Capillary: 281 mg/dL — ABNORMAL HIGH (ref 70–99)

## 2020-09-22 LAB — CREATININE, SERUM
Creatinine, Ser: 2.01 mg/dL — ABNORMAL HIGH (ref 0.44–1.00)
GFR, Estimated: 27 mL/min — ABNORMAL LOW (ref >60)

## 2020-09-22 MED ORDER — INSULIN GLARGINE-YFGN 100 UNIT/ML ~~LOC~~ SOLN
20.0000 [IU] | Freq: Every morning | SUBCUTANEOUS | Status: DC
  Administered 2020-09-22 – 2020-09-25 (×4): 20 [IU] via SUBCUTANEOUS
  Filled 2020-09-22 (×5): qty 0.2

## 2020-09-22 MED ORDER — VANCOMYCIN HCL 2000 MG/400ML IV SOLN
2000.0000 mg | INTRAVENOUS | Status: DC
  Administered 2020-09-22 – 2020-09-24 (×2): 2000 mg via INTRAVENOUS
  Filled 2020-09-22 (×2): qty 400

## 2020-09-22 NOTE — Progress Notes (Signed)
Physical Therapy Treatment Patient Details Name: Deanna Rice MRN: [id number] DOB: [birth year] Today's Date: 09/22/2020    History of Present Illness 65 y.o. female admitted with abcess of R foot; now s/p debridement of ulcers. Medical history significant for morbid obesity, type 2 diabetes mellitus, OSA,COPD/chronic respiratory failure on 2 L home O2, chronic venous insufficiency, hypothyroidism, chronic diastolic CHF, chronic osteoarthritis, depression who 5 months ago was transitioned from ALF to SNF due to chronic debility and falls.    PT Comments    Pt received supine in bed, sitting up with HOB elevated. Pt alert and agreeable to therapy. Pt states she needed to use the restroom. PT attempted to don post-op shoe for transfer. Once standing, pt immediatly stated she did not feel safe. Command following was limited with shoe donned, pt safety became compromised as she was standing and moving forward against PT cueing. For safety, PT ordered pt to step back and sit down. Ambulatory toilet transfer and toileting performed in today's session without post-op shoe. Pt utilized grab bars to assist with STS from standard height toilet. CGA throughout transfer for safety. PT managed IV pole. Pt ambulated 49ft to toilet and 35ft from toilet to chair. Mild gait deviations with heavy VC to maintain WB through R heel only. Education provided on purpose of WB precautions. Pt was positioned in recliner at end of session with tray set up for lunch. Would benefit from skilled PT to address above deficits and promote optimal return to PLOF.    Follow Up Recommendations  SNF (return to current SNF)     Equipment Recommendations  None recommended by PT    Recommendations for Other Services       Precautions / Restrictions Precautions Precautions: Fall Restrictions Other Position/Activity Restrictions: per podiatry note ambulate with heel contact only    Mobility  Bed Mobility Overal bed mobility:  Needs Assistance Bed Mobility: Supine to Sit     Supine to sit: Supervision     General bed mobility comments: SUP for safety    Transfers Overall transfer level: Needs assistance Equipment used: Rolling walker (2 wheeled) Transfers: Sit to/from Stand Sit to Stand: Min guard         General transfer comment: with RW, CGA and VC for precautions to RLE  Ambulation/Gait Ambulation/Gait assistance: Min guard Gait Distance (Feet): 25 Feet Assistive device: Rolling walker (2 wheeled) Gait Pattern/deviations: Step-to pattern;Decreased stance time - right;Decreased weight shift to right     General Gait Details: 16ft followed by 71ft. All ambulation within room. Step-to pattern leading w/ RLE, heel contact only. VC to maintain precautions as pt denies pain and is tempted to walk on full foot. Unsafe with post-op shoe due to unsteadiness; removed shoe, heavy VC to maintain heel contact.   Stairs             Wheelchair Mobility    Modified Rankin (Stroke Patients Only)       Balance Overall balance assessment: Mild deficits observed, not formally tested;Needs assistance Sitting-balance support: No upper extremity supported Sitting balance-Leahy Scale: Good     Standing balance support: Bilateral upper extremity supported Standing balance-Leahy Scale: Fair Standing balance comment: RW, CGA to steady, weight shift towards left and WB through R heel only                            Cognition Arousal/Alertness: Awake/alert Behavior During Therapy: WFL for tasks assessed/performed Overall Cognitive Status: Within  Functional Limits for tasks assessed                                        Exercises Other Exercises Other Exercises: PT attempted to don post-op shoe. Once standing, pt immediatly stated she did not feel safe. Command following was limited with shoe donned, pt safety became compromised as she was standing. For safety, PT ordered  pt to step back and sit down. Ambulatory toilet transfer and toileting performed in today's session without post-op shoe. Pt utilized grab bars to assist with STS from standard height toilet. CGA throughout for safety. PT managed IV pole.    General Comments        Pertinent Vitals/Pain Pain Assessment: No/denies pain    Home Living                      Prior Function            PT Goals (current goals can now be found in the care plan section) Acute Rehab PT Goals Patient Stated Goal: to get stronger    Frequency    Min 2X/week      PT Plan      Co-evaluation              AM-PAC PT "6 Clicks" Mobility   Outcome Measure  Help needed turning from your back to your side while in a flat bed without using bedrails?: A Lot Help needed moving from lying on your back to sitting on the side of a flat bed without using bedrails?: A Lot Help needed moving to and from a bed to a chair (including a wheelchair)?: A Little Help needed standing up from a chair using your arms (e.g., wheelchair or bedside chair)?: A Little Help needed to walk in hospital room?: A Little Help needed climbing 3-5 steps with a railing? : A Lot 6 Click Score: 15    End of Session Equipment Utilized During Treatment: Gait belt Activity Tolerance: Patient tolerated treatment well;No increased pain Patient left: in chair;with call bell/phone within reach;with chair alarm set Nurse Communication: Mobility status PT Visit Diagnosis: Unsteadiness on feet (R26.81);History of falling (Z91.81)     Time: 8527-7824 PT Time Calculation (min) (ACUTE ONLY): 24 min  Charges:  $Therapeutic Activity: 23-37 mins                     Basilia Jumbo PT, DPT 09/22/20 2:02 PM [phone]    Lavenia Atlas 09/22/2020, 2:02 PM

## 2020-09-22 NOTE — Plan of Care (Signed)
  Problem: Education: Goal: Knowledge of General Education information will improve Description: Including pain rating scale, medication(s)/side effects and non-pharmacologic comfort measures Outcome: Progressing  Pt is alert and oriented, aware/understands clinical condition, no complaints of pain or discomfort, tolerates IV abt therapy, fluids encouraged, no adverse reactions

## 2020-09-22 NOTE — Progress Notes (Signed)
Pharmacy Antibiotic Note  Deanna Rice is a 65 y.o. female admitted on 09/19/2020 with cellulitis. Pt was in ED on 7/18 at which time pt was diagnosed with cellulitis of R foot. Pt was prescribed bactrim at discharge. Pt returned ~1 week later with progressive infection and R foot abscess. Pharmacy has been consulted for vancomycin dosing.  Day 4 ABX  Plan: --Renal function continues to improve daily. Still not at baseline but will transition to AUC dosing and continue to monitor  Vancomycin 2000 mg IV Q 48 hrs. Goal AUC 400-550. Expected AUC: 508 Expected Css: 11 SCr used: 2.01   Height: 5\' 7"  (170.2 cm) Weight: (!) 147.9 kg (326 lb) IBW/kg (Calculated) : 61.6  Temp (24hrs), Avg:98.2 F (36.8 C), Min:97.8 F (36.6 C), Max:98.6 F (37 C)  Recent Labs  Lab 09/19/20 1637 09/19/20 1750 09/20/20 0455 09/21/20 0451 09/21/20 1223 09/22/20 1246  WBC 8.3  --  6.1  --   --   --   CREATININE 2.43*  --  2.45* 2.13*  --  2.01*  LATICACIDVEN  --  1.0  --   --   --   --   VANCORANDOM  --   --   --   --  11  --      Estimated Creatinine Clearance: 41.8 mL/min (A) (by C-G formula based on SCr of 2.01 mg/dL (H)).    No Known Allergies  Antimicrobials this admission: 7/26 ceftriaxone >>7/27 7/26 vancomycin >>   Dose adjustments this admission:   Microbiology results: 7/26 BCx: NGTD 7/27 MRSA PCR: positive  Thank you for allowing pharmacy to be a part of this patient's care.  8/27 09/22/2020 3:40 PM

## 2020-09-22 NOTE — Progress Notes (Signed)
PROGRESS NOTE    Deanna LittenSandra C Orne  ZOX:[id number] DOB: 10/06/1955 DOA: 09/19/2020 PCP: Lyndon CodeKhan, Fozia M, MD   Brief Narrative:   65 year old woman with medical history significant for morbid obesity-BMI of 60, type 2 diabetes mellitus, OSA,COPD/chronic respiratory failure on 4 L home O2, chronic venous insufficiency, hypothyroidism, chronic diastolic CHF, chronic osteoarthritis, depression who 5 months ago was transitioned from ALF to SNF due to chronic debility and falls. Patient also has interim history of ED visit on 7/19 at which time patient was diagnosed with  cellulitis of there right foot with what appears to be small puncture like wound on plantar surface. Patient was prescribed bactrim and discharged back to SNF.Patient now returns to ed  one week later with progressive infection and right foot abscess. Patient denies any injury to foot, and noted foot is very tender with clear/yellow drainage. She denies any fever/chills/ n/v/diarrhea / chest pain or sob.   MRI completed which did demonstrate plantar aspect abscess with associated soft tissue swelling.  Study was limited by motion artifact but no obvious evidence of osteomyelitis.  Seen in consultation by podiatry.  No surgical intervention warranted.  Bedside debridement done.  No bedside I&D indicated at this time.  Patient is improving symptomatically.   Assessment & Plan:   Active Problems:   Cellulitis  Right plantar diabetic foot ulcer with associated cellulitis and abscess Patient failed outpatient antibiotics MRI limited by motion artifact but negative for bone involvement Podiatry consulted, no surgical intervention warranted Podiatry did do bedside debridement Plan: Continue vancomycin Follow wound cultures, no growth to date Monitor vitals and fever curve Appreciate WOC consult Therapy evaluations, patient will return to skilled nursing facility Anticipated date of discharge 7/30  AKI on CKD stage IIIa. Baseline  creatinine appears to be 1.4 Presented with creatinine 2.43 ATN versus prerenal azotemia Slowly improving over interval Plan: Continue IVF Avoid nephrotoxins Monitor kidney function  Insulin-dependent type 2 diabetes mellitus Recent hemoglobin A1c 8.0 Plan: Lantus 25 units nightly, 20 units every morning Lispro 4 units 3 times daily with meals Moderate sliding scale 3 times daily AC Nightly coverage Carb modified diet Diabetes coordinator consult  Essential hypertension, uncontrolled Continue home regimen of amlodipine, Imdur, metoprolol Uptitrate as necessary to achieve BP control Ensure pain control As needed IV hydralazine  COPD Chronic respiratory failure on 2L home oxygen Not currently exacerbated Continue bronchodilators and oxygen  Chronic venous insufficiency Likely secondary to uncontrolled diabetes  Hypothyroidism Patient was not on thyroid replacement TSH greater than 9 Free T4 0.76 Plan: Continue Synthroid 25 mcg daily  Obstructive sleep apnea Not on CPAP for unclear reasons  Chronic diastolic congestive heart failure Not acutely exacerbated Continue beta-blockade  Coronary artery disease Continue calcium channel blocker, Imdur, high intensity statin, as needed nitroglycerin  Osteoarthritis with chronic debility Pain control Therapy evaluations  Depression PTA SSRI  Morbid obesity BMI 51 Complicates care and prognosis Dietitian consult   DVT prophylaxis: SQ Lovenox Code Status: Full Family Communication: None today Disposition Plan: Status is: Inpatient  Remains inpatient appropriate because:Inpatient level of care appropriate due to severity of illness  Dispo: The patient is from: Home              Anticipated d/c is to: Home              Patient currently is not medically stable to d/c.   Difficult to place patient No  Plantar foot ulcer without evidence of acute osteomyelitis.  On IV antibiotics and pain  control.  Podiatry  consulted, recommendations appreciated.  Anticipated disposition to skilled nursing facility.  Patient will return to skilled nursing facility, anticipate medical readiness on 7/30     Level of care: Med-Surg  Consultants:  Podiatry  Procedures:  None  Antimicrobials:  Vancomycin   Subjective: Patient seen and examined.  Improved pain control.  No complaints  Objective: Vitals:   09/21/20 1611 09/21/20 2015 09/22/20 0423 09/22/20 0700  BP: (!) 142/75 (!) 171/77 (!) 160/84 (!) 158/79  Pulse: 73 76 75 75  Resp: 18 16 16 16   Temp: 97.8 F (36.6 C) 98.6 F (37 C) 98.4 F (36.9 C) 98.2 F (36.8 C)  TempSrc:      SpO2: 94% 97% 97% 97%  Weight:      Height:        Intake/Output Summary (Last 24 hours) at 09/22/2020 1200 Last data filed at 09/22/2020 0900 Gross per 24 hour  Intake 1435.49 ml  Output 4400 ml  Net -2964.51 ml   Filed Weights   09/19/20 1632  Weight: (!) 147.9 kg    Examination:  General exam: No acute distress Respiratory system: Lungs clear.  Normal work of breathing.  2 L Cardiovascular system: S1-S2, regular rate and rhythm, no murmurs, no pedal edema  gastrointestinal system: Obese, nontender, nondistended, normal bowel sounds Central nervous system: Alert and oriented. No focal neurological deficits. Extremities: 5/5 power symmetrically Skin: Static change improved on plantar aspect right foot Psychiatry: Judgement and insight appear normal. Mood & affect appropriate.     Data Reviewed: I have personally reviewed following labs and imaging studies  CBC: Recent Labs  Lab 09/19/20 1637 09/20/20 0455  WBC 8.3 6.1  NEUTROABS 6.5  --   HGB 9.3* 9.0*  HCT 27.7* 27.1*  MCV 90.2 90.9  PLT 177 137*   Basic Metabolic Panel: Recent Labs  Lab 09/19/20 1637 09/20/20 0455 09/21/20 0451  NA 135 138  --   K 4.6 4.4  --   CL 101 104  --   CO2 24 27  --   GLUCOSE 235* 213*  --   BUN 47* 41*  --   CREATININE 2.43* 2.45* 2.13*  CALCIUM  8.1* 8.0*  --    GFR: Estimated Creatinine Clearance: 39.4 mL/min (A) (by C-G formula based on SCr of 2.13 mg/dL (H)). Liver Function Tests: Recent Labs  Lab 09/19/20 1637  AST 19  ALT 18  ALKPHOS 134*  BILITOT 0.5  PROT 6.5  ALBUMIN 2.6*   No results for input(s): LIPASE, AMYLASE in the last 168 hours. No results for input(s): AMMONIA in the last 168 hours. Coagulation Profile: No results for input(s): INR, PROTIME in the last 168 hours. Cardiac Enzymes: No results for input(s): CKTOTAL, CKMB, CKMBINDEX, TROPONINI in the last 168 hours. BNP (last 3 results) No results for input(s): PROBNP in the last 8760 hours. HbA1C: No results for input(s): HGBA1C in the last 72 hours. CBG: Recent Labs  Lab 09/21/20 1218 09/21/20 1615 09/21/20 2022 09/22/20 0843 09/22/20 1130  GLUCAP 223* 266* 252* 166* 188*   Lipid Profile: No results for input(s): CHOL, HDL, LDLCALC, TRIG, CHOLHDL, LDLDIRECT in the last 72 hours. Thyroid Function Tests: Recent Labs    09/20/20 0455  TSH 9.283*  FREET4 0.76   Anemia Panel: No results for input(s): VITAMINB12, FOLATE, FERRITIN, TIBC, IRON, RETICCTPCT in the last 72 hours. Sepsis Labs: Recent Labs  Lab 09/19/20 1750  LATICACIDVEN 1.0    Recent Results (from the past 240 hour(s))  Blood culture (single)     Status: None   Collection Time: 09/12/20  6:06 PM   Specimen: BLOOD  Result Value Ref Range Status   Specimen Description BLOOD BLOOD RIGHT FOREARM  Final   Special Requests   Final    BOTTLES DRAWN AEROBIC AND ANAEROBIC Blood Culture adequate volume   Culture   Final    NO GROWTH 6 DAYS Performed at [facility], [address], [place], [place] [address]    Report Status 09/18/2020 FINAL  Final  Aerobic/Anaerobic Culture w Gram Stain (surgical/deep wound)     Status: None   Collection Time: 09/12/20  8:06 PM   Specimen: Foot  Result Value Ref Range Status   Specimen Description   Final    FOOT RIGHT  FOOT Performed at [facility], [address], [place], [place] [address]    Special Requests   Final    NONE Performed at [facility], [address] [place], [place] [address]    Gram Stain NO WBC SEEN NO ORGANISMS SEEN   Final   Culture   Final    No growth aerobically or anaerobically. Performed at [facility], [address], [place], [place] [address]    Report Status 09/18/2020 FINAL  Final  Blood culture (single)     Status: None (Preliminary result)   Collection Time: 09/19/20  5:50 PM   Specimen: BLOOD  Result Value Ref Range Status   Specimen Description BLOOD BLOOD RIGHT FOREARM  Final   Special Requests   Final    BOTTLES DRAWN AEROBIC AND ANAEROBIC Blood Culture results may not be optimal due to an excessive volume of blood received in culture bottles   Culture   Final    NO GROWTH 2 DAYS Performed at [facility], [address], [place], [place] [address]    Report Status PENDING  Incomplete  Resp Panel by RT-PCR (Flu A&B, Covid) Nasopharyngeal Swab     Status: None   Collection Time: 09/19/20  5:50 PM   Specimen: Nasopharyngeal Swab; Nasopharyngeal(NP) swabs in vial transport medium  Result Value Ref Range Status   SARS Coronavirus 2 by RT PCR NEGATIVE NEGATIVE Final    Comment: (NOTE) SARS-CoV-2 target nucleic acids are NOT DETECTED.  The SARS-CoV-2 RNA is generally detectable in upper respiratory specimens during the acute phase of infection. The lowest concentration of SARS-CoV-2 viral copies this assay can detect is 138 copies/mL. A negative result does not preclude SARS-Cov-2 infection and should not be used as the sole basis for treatment or other patient management decisions. A negative result may occur with  improper specimen collection/handling, submission of specimen other than nasopharyngeal swab, presence of viral mutation(s) within the areas targeted by this assay, and inadequate number of  viral copies(<138 copies/mL). A negative result must be combined with clinical observations, patient history, and epidemiological information. The expected result is Negative.  Fact Sheet for Patients:  BloggerCourse.com  Fact Sheet for Healthcare Providers:  SeriousBroker.it  This test is no t yet approved or cleared by the [place] FDA and  has been authorized for detection and/or diagnosis of SARS-CoV-2 by FDA under an Emergency Use Authorization (EUA). This EUA will remain  in effect (meaning this test can be used) for the duration of the COVID-19 declaration under Section 564(b)(1) of the Act, 21 U.S.C.section 360bbb-3(b)(1), unless the authorization is terminated  or revoked sooner.       Influenza A by PCR NEGATIVE  NEGATIVE Final   Influenza B by PCR NEGATIVE NEGATIVE Final    Comment: (NOTE) The Xpert Xpress SARS-CoV-2/FLU/RSV plus assay is intended as an aid in the diagnosis of influenza from Nasopharyngeal swab specimens and should not be used as a sole basis for treatment. Nasal washings and aspirates are unacceptable for Xpert Xpress SARS-CoV-2/FLU/RSV testing.  Fact Sheet for Patients: BloggerCourse.com  Fact Sheet for Healthcare Providers: SeriousBroker.it  This test is not yet approved or cleared by the [place] FDA and has been authorized for detection and/or diagnosis of SARS-CoV-2 by FDA under an Emergency Use Authorization (EUA). This EUA will remain in effect (meaning this test can be used) for the duration of the COVID-19 declaration under Section 564(b)(1) of the Act, 21 U.S.C. section 360bbb-3(b)(1), unless the authorization is terminated or revoked.  Performed at [facility], [address] [place], [place] [address]   MRSA Next Gen by PCR, Nasal     Status: Abnormal   Collection Time: 09/20/20  4:30 AM   Specimen: Nasal  Mucosa; Nasal Swab  Result Value Ref Range Status   MRSA by PCR Next Gen DETECTED (A) NOT DETECTED Final    Comment: RESULT CALLED TO, READ BACK BY AND VERIFIED WITH: KAYLAN FREEMIRE@[id number]  ON 09/20/20 BY HKP (NOTE) The GeneXpert MRSA Assay (FDA approved for NASAL specimens only), is one component of a comprehensive MRSA colonization surveillance program. It is not intended to diagnose MRSA infection nor to guide or monitor treatment for MRSA infections. Test performance is not FDA approved in patients less than 42 years old. Performed at [facility], [address] [place], [place] [address]   Aerobic/Anaerobic Culture w Gram Stain (surgical/deep wound)     Status: None (Preliminary result)   Collection Time: 09/20/20  3:11 PM   Specimen: Wound  Result Value Ref Range Status   Specimen Description   Final    WOUND Performed at [facility], [address], [place], [place] [address]    Special Requests   Final    FOOT Performed at [facility], [address] [place], [place] [address]    Gram Stain NO WBC SEEN NO ORGANISMS SEEN   Final   Culture   Final    NO GROWTH 2 DAYS NO ANAEROBES ISOLATED; CULTURE IN PROGRESS FOR 5 DAYS Performed at [facility], [address] [place], [place] [address]    Report Status PENDING  Incomplete         Radiology Studies: No results found.      Scheduled Meds:  acidophilus  1 capsule Oral Daily   amLODipine  10 mg Oral Daily   aspirin EC  81 mg Oral Daily   Chlorhexidine Gluconate Cloth  6 each Topical Q0600   clopidogrel  75 mg Oral Daily   DULoxetine  60 mg Oral BID   fluticasone  2 spray Each Nare Daily   fluticasone furoate-vilanterol  1 puff Inhalation Daily   folic acid  1 mg Oral Daily   gabapentin  300 mg Oral BID   heparin  5,000 Units Subcutaneous Q8H   insulin aspart  0-15 Units Subcutaneous TID WC   insulin aspart  0-5 Units Subcutaneous QHS   insulin aspart  4 Units  Subcutaneous TID WC   insulin glargine-yfgn  20 Units Subcutaneous q morning   insulin glargine-yfgn  25 Units Subcutaneous QHS   isosorbide mononitrate  60 mg Oral Daily   levothyroxine  25 mcg Oral Q0600  loratadine  10 mg Oral Daily   melatonin  10 mg Oral QHS   metoprolol succinate  50 mg Oral Daily   multivitamin with minerals  1 tablet Oral Daily   mupirocin ointment  1 application Nasal BID   nutrition supplement (JUVEN)  1 packet Oral BID BM   pantoprazole  40 mg Oral Daily   Ensure Max Protein  11 oz Oral Daily   simvastatin  5 mg Oral Daily   sodium chloride flush  3 mL Intravenous Q12H   vancomycin variable dose per unstable renal function (pharmacist dosing)   Does not apply See admin instructions   Continuous Infusions:  sodium chloride 75 mL/hr at 09/21/20 2305      LOS: 2 days    Time spent: 25 minutes    Tresa Moore, MD [facility] Pager [phone]  If 7PM-7AM, please contact night-coverage 09/22/2020, 12:00 PM

## 2020-09-23 ENCOUNTER — Inpatient Hospital Stay: Payer: Medicare Other

## 2020-09-23 DIAGNOSIS — E1151 Type 2 diabetes mellitus with diabetic peripheral angiopathy without gangrene: Secondary | ICD-10-CM | POA: Diagnosis not present

## 2020-09-23 DIAGNOSIS — L0291 Cutaneous abscess, unspecified: Secondary | ICD-10-CM | POA: Diagnosis not present

## 2020-09-23 DIAGNOSIS — L03115 Cellulitis of right lower limb: Secondary | ICD-10-CM | POA: Diagnosis not present

## 2020-09-23 LAB — CBC WITH DIFFERENTIAL/PLATELET
Abs Immature Granulocytes: 0.09 10*3/uL — ABNORMAL HIGH (ref 0.00–0.07)
Basophils Absolute: 0 10*3/uL (ref 0.0–0.1)
Basophils Relative: 1 %
Eosinophils Absolute: 0.1 10*3/uL (ref 0.0–0.5)
Eosinophils Relative: 2 %
HCT: 23.7 % — ABNORMAL LOW (ref 36.0–46.0)
Hemoglobin: 8.1 g/dL — ABNORMAL LOW (ref 12.0–15.0)
Immature Granulocytes: 2 %
Lymphocytes Relative: 18 %
Lymphs Abs: 1.1 10*3/uL (ref 0.7–4.0)
MCH: 30.8 pg (ref 26.0–34.0)
MCHC: 34.2 g/dL (ref 30.0–36.0)
MCV: 90.1 fL (ref 80.0–100.0)
Monocytes Absolute: 0.4 10*3/uL (ref 0.1–1.0)
Monocytes Relative: 6 %
Neutro Abs: 4.1 10*3/uL (ref 1.7–7.7)
Neutrophils Relative %: 71 %
Platelets: 160 10*3/uL (ref 150–400)
RBC: 2.63 MIL/uL — ABNORMAL LOW (ref 3.87–5.11)
RDW: 13.9 % (ref 11.5–15.5)
WBC: 5.8 10*3/uL (ref 4.0–10.5)
nRBC: 0 % (ref 0.0–0.2)

## 2020-09-23 LAB — BASIC METABOLIC PANEL
Anion gap: 7 (ref 5–15)
BUN: 47 mg/dL — ABNORMAL HIGH (ref 8–23)
CO2: 22 mmol/L (ref 22–32)
Calcium: 7.9 mg/dL — ABNORMAL LOW (ref 8.9–10.3)
Chloride: 106 mmol/L (ref 98–111)
Creatinine, Ser: 2.14 mg/dL — ABNORMAL HIGH (ref 0.44–1.00)
GFR, Estimated: 25 mL/min — ABNORMAL LOW (ref >60)
Glucose, Bld: 256 mg/dL — ABNORMAL HIGH (ref 70–99)
Potassium: 4.2 mmol/L (ref 3.5–5.1)
Sodium: 135 mmol/L (ref 135–145)

## 2020-09-23 LAB — GLUCOSE, CAPILLARY
Glucose-Capillary: 212 mg/dL — ABNORMAL HIGH (ref 70–99)
Glucose-Capillary: 218 mg/dL — ABNORMAL HIGH (ref 70–99)
Glucose-Capillary: 230 mg/dL — ABNORMAL HIGH (ref 70–99)
Glucose-Capillary: 242 mg/dL — ABNORMAL HIGH (ref 70–99)

## 2020-09-23 MED ORDER — SENNOSIDES-DOCUSATE SODIUM 8.6-50 MG PO TABS
1.0000 | ORAL_TABLET | Freq: Two times a day (BID) | ORAL | Status: DC
  Administered 2020-09-23 – 2020-09-25 (×5): 1 via ORAL
  Filled 2020-09-23 (×5): qty 1

## 2020-09-23 MED ORDER — METOPROLOL SUCCINATE ER 50 MG PO TB24
75.0000 mg | ORAL_TABLET | Freq: Every day | ORAL | Status: DC
  Administered 2020-09-24 – 2020-09-25 (×2): 75 mg via ORAL
  Filled 2020-09-23 (×2): qty 1

## 2020-09-23 MED ORDER — INSULIN ASPART 100 UNIT/ML IJ SOLN
7.0000 [IU] | Freq: Three times a day (TID) | INTRAMUSCULAR | Status: DC
  Administered 2020-09-23 – 2020-09-25 (×6): 7 [IU] via SUBCUTANEOUS
  Filled 2020-09-23 (×6): qty 1

## 2020-09-23 MED ORDER — BISACODYL 10 MG RE SUPP: 10.0000 mg | Freq: Every day | RECTAL | Status: DC | PRN

## 2020-09-23 MED ORDER — POLYETHYLENE GLYCOL 3350 17 G PO PACK
17.0000 g | PACK | Freq: Every day | ORAL | Status: DC
  Administered 2020-09-23 – 2020-09-25 (×3): 17 g via ORAL
  Filled 2020-09-23 (×3): qty 1

## 2020-09-23 NOTE — Progress Notes (Signed)
PROGRESS NOTE    Deanna Rice  VOH:[id number] DOB: 08-16-1955 DOA: 09/19/2020 PCP: Lyndon Code, MD   Brief Narrative:   65 year old woman with medical history significant for morbid obesity-BMI of 60, type 2 diabetes mellitus, OSA,COPD/chronic respiratory failure on 4 L home O2, chronic venous insufficiency, hypothyroidism, chronic diastolic CHF, chronic osteoarthritis, depression who 5 months ago was transitioned from ALF to SNF due to chronic debility and falls. Patient also has interim history of ED visit on 7/19 at which time patient was diagnosed with  cellulitis of there right foot with what appears to be small puncture like wound on plantar surface. Patient was prescribed bactrim and discharged back to SNF.Patient now returns to ed  one week later with progressive infection and right foot abscess. Patient denies any injury to foot, and noted foot is very tender with clear/yellow drainage. She denies any fever/chills/ n/v/diarrhea / chest pain or sob.   MRI completed which did demonstrate plantar aspect abscess with associated soft tissue swelling.  Study was limited by motion artifact but no obvious evidence of osteomyelitis.  Seen in consultation by podiatry.  No surgical intervention warranted.  Bedside debridement done.  No bedside I&D indicated at this time.  Patient is improving symptomatically.   Assessment & Plan:   Active Problems:   Cellulitis  Right plantar diabetic foot ulcer with associated cellulitis and abscess Patient failed outpatient antibiotics MRI limited by motion artifact but negative for bone involvement Podiatry consulted, no surgical intervention warranted Podiatry did do bedside debridement  Plan: Continue vancomycin Follow wound cultures, no growth to date Monitor vitals and fever curve Appreciate WOC consult Therapy evaluations, patient will return to skilled nursing facility Anticipated medical readiness for discharge 7/31 Will transition to p.o.  doxycycline at time of discharge  AKI on CKD stage IIIa. Baseline creatinine appears to be 1.4 Presented with creatinine 2.43 ATN versus prerenal azotemia Overall improvement however creatinine slightly worsened on 7/30 Plan: Continue IVF Avoid nephrotoxins Monitor kidney function Pharmacy following for vancomycin dosing  Insulin-dependent type 2 diabetes mellitus Recent hemoglobin A1c 8.0 Plan: Lantus 25 units nightly, 20 units every morning Lispro 4 units 3 times daily with meals Moderate sliding scale 3 times daily AC Nightly coverage Carb modified diet Diabetes coordinator consult  Essential hypertension, uncontrolled Continue home regimen of amlodipine, Imdur, metoprolol Uptitrate as necessary to achieve BP control Ensure pain control As needed IV hydralazine  COPD Chronic respiratory failure on 2L home oxygen Not currently exacerbated Continue bronchodilators and oxygen  Chronic venous insufficiency Likely secondary to uncontrolled diabetes  Hypothyroidism Patient was not on thyroid replacement TSH greater than 9 Free T4 0.76 Plan: Continue Synthroid 25 mcg daily  Obstructive sleep apnea Not on CPAP for unclear reasons  Chronic diastolic congestive heart failure Not acutely exacerbated Continue beta-blockade  Coronary artery disease Continue calcium channel blocker, Imdur, high intensity statin, as needed nitroglycerin  Osteoarthritis with chronic debility Pain control Therapy evaluations  Depression PTA SSRI  Morbid obesity BMI 51 Complicates care and prognosis Dietitian consult  Constipation Patient endorses several days without BM Associated abdominal pain Initiate bowel regimen Check KUB   DVT prophylaxis: SQ Lovenox Code Status: Full Family Communication: None today.  Offered to call the patient declined Disposition Plan: Status is: Inpatient  Remains inpatient appropriate because:Inpatient level of care appropriate due to  severity of illness  Dispo: The patient is from: Home              Anticipated d/c is to:  Home              Patient currently is not medically stable to d/c.   Difficult to place patient No  Plantar foot ulcer without evidence of acute osteomyelitis.  On IV antibiotics and pain control.  Podiatry consulted, recommendations appreciated.  Anticipated disposition to skilled nursing facility.  Bowel regimen initiated 7/30.  Plan for patient to return to skilled nursing facility on 7/31     Level of care: Med-Surg  Consultants:  Podiatry  Procedures:  None  Antimicrobials:  Vancomycin   Subjective: Patient seen and examined.  I endorses abdominal pain and no bowel movement in several days  Objective: Vitals:   09/22/20 1939 09/23/20 0100 09/23/20 0513 09/23/20 0836  BP: (!) 134/50 138/61 (!) 167/84 (!) 157/71  Pulse: 74 74 81 79  Resp: Temp: 98.5 F (36.9 C) 98.6 F (37 C) 98 F (36.7 C) 97.7 F (36.5 C)  TempSrc: Oral Oral    SpO2: 100% 100% 96% 99%  Weight:      Height:        Intake/Output Summary (Last 24 hours) at 09/23/2020 1117 Last data filed at 09/23/2020 0500 Gross per 24 hour  Intake 2922.46 ml  Output 2000 ml  Net 922.46 ml   Filed Weights   09/19/20 1632  Weight: (!) 147.9 kg    Examination:  General exam: No acute distress Respiratory system: Lungs clear.  Normal work of breathing.  2 L Cardiovascular system: S1-S2, regular rate and rhythm, no murmurs, no pedal edema  gastrointestinal system: Obese, nondistended, mild tender to palpation, normal bowel sounds Central nervous system: Alert and oriented. No focal neurological deficits. Extremities: 5/5 power symmetrically Skin: Static change improved on plantar aspect right foot Psychiatry: Judgement and insight appear normal. Mood & affect appropriate.     Data Reviewed: I have personally reviewed following labs and imaging studies  CBC: Recent Labs  Lab 09/19/20 1637  09/20/20 0455 09/23/20 0629  WBC 8.3 6.1 5.8  NEUTROABS 6.5  --  4.1  HGB 9.3* 9.0* 8.1*  HCT 27.7* 27.1* 23.7*  MCV 90.2 90.9 90.1  PLT 177 137* 160   Basic Metabolic Panel: Recent Labs  Lab 09/19/20 1637 09/20/20 0455 09/21/20 0451 09/22/20 1246 09/23/20 0629  NA 135 138  --   --  135  K 4.6 4.4  --   --  4.2  CL 101 104  --   --  106  CO2 24 27  --   --  22  GLUCOSE 235* 213*  --   --  256*  BUN 47* 41*  --   --  47*  CREATININE 2.43* 2.45* 2.13* 2.01* 2.14*  CALCIUM 8.1* 8.0*  --   --  7.9*   GFR: Estimated Creatinine Clearance: 39.2 mL/min (A) (by C-G formula based on SCr of 2.14 mg/dL (H)). Liver Function Tests: Recent Labs  Lab 09/19/20 1637  AST 19  ALT 18  ALKPHOS 134*  BILITOT 0.5  PROT 6.5  ALBUMIN 2.6*   No results for input(s): LIPASE, AMYLASE in the last 168 hours. No results for input(s): AMMONIA in the last 168 hours. Coagulation Profile: No results for input(s): INR, PROTIME in the last 168 hours. Cardiac Enzymes: No results for input(s): CKTOTAL, CKMB, CKMBINDEX, TROPONINI in the last 168 hours. BNP (last 3 results) No results for input(s): PROBNP in the last 8760 hours. HbA1C: No results for input(s): HGBA1C in the last 72 hours. CBG:  Recent Labs  Lab 09/22/20 0843 09/22/20 1130 09/22/20 1645 09/22/20 2141 09/23/20 0836  GLUCAP 166* 188* 198* 281* 212*   Lipid Profile: No results for input(s): CHOL, HDL, LDLCALC, TRIG, CHOLHDL, LDLDIRECT in the last 72 hours. Thyroid Function Tests: No results for input(s): TSH, T4TOTAL, FREET4, T3FREE, THYROIDAB in the last 72 hours.  Anemia Panel: No results for input(s): VITAMINB12, FOLATE, FERRITIN, TIBC, IRON, RETICCTPCT in the last 72 hours. Sepsis Labs: Recent Labs  Lab 09/19/20 1750  LATICACIDVEN 1.0    Recent Results (from the past 240 hour(s))  Blood culture (single)     Status: None (Preliminary result)   Collection Time: 09/19/20  5:50 PM   Specimen: BLOOD  Result Value Ref  Range Status   Specimen Description BLOOD BLOOD RIGHT FOREARM  Final   Special Requests   Final    BOTTLES DRAWN AEROBIC AND ANAEROBIC Blood Culture results may not be optimal due to an excessive volume of blood received in culture bottles   Culture   Final    NO GROWTH 3 DAYS Performed at [facility], [address], [place], [place] [address]    Report Status PENDING  Incomplete  Resp Panel by RT-PCR (Flu A&B, Covid) Nasopharyngeal Swab     Status: None   Collection Time: 09/19/20  5:50 PM   Specimen: Nasopharyngeal Swab; Nasopharyngeal(NP) swabs in vial transport medium  Result Value Ref Range Status   SARS Coronavirus 2 by RT PCR NEGATIVE NEGATIVE Final    Comment: (NOTE) SARS-CoV-2 target nucleic acids are NOT DETECTED.  The SARS-CoV-2 RNA is generally detectable in upper respiratory specimens during the acute phase of infection. The lowest concentration of SARS-CoV-2 viral copies this assay can detect is 138 copies/mL. A negative result does not preclude SARS-Cov-2 infection and should not be used as the sole basis for treatment or other patient management decisions. A negative result may occur with  improper specimen collection/handling, submission of specimen other than nasopharyngeal swab, presence of viral mutation(s) within the areas targeted by this assay, and inadequate number of viral copies(<138 copies/mL). A negative result must be combined with clinical observations, patient history, and epidemiological information. The expected result is Negative.  Fact Sheet for Patients:  BloggerCourse.com  Fact Sheet for Healthcare Providers:  SeriousBroker.it  This test is no t yet approved or cleared by the [place] FDA and  has been authorized for detection and/or diagnosis of SARS-CoV-2 by FDA under an Emergency Use Authorization (EUA). This EUA will remain  in effect (meaning this test can be used) for  the duration of the COVID-19 declaration under Section 564(b)(1) of the Act, 21 U.S.C.section 360bbb-3(b)(1), unless the authorization is terminated  or revoked sooner.       Influenza A by PCR NEGATIVE NEGATIVE Final   Influenza B by PCR NEGATIVE NEGATIVE Final    Comment: (NOTE) The Xpert Xpress SARS-CoV-2/FLU/RSV plus assay is intended as an aid in the diagnosis of influenza from Nasopharyngeal swab specimens and should not be used as a sole basis for treatment. Nasal washings and aspirates are unacceptable for Xpert Xpress SARS-CoV-2/FLU/RSV testing.  Fact Sheet for Patients: BloggerCourse.com  Fact Sheet for Healthcare Providers: SeriousBroker.it  This test is not yet approved or cleared by the [place] FDA and has been authorized for detection and/or diagnosis of SARS-CoV-2 by FDA under an Emergency Use Authorization (EUA). This EUA will remain in effect (meaning this test can be used) for the duration of the COVID-19 declaration under Section 564(b)(1)  of the Act, 21 U.S.C. section 360bbb-3(b)(1), unless the authorization is terminated or revoked.  Performed at [facility], [address] [place], [place] [address]   MRSA Next Gen by PCR, Nasal     Status: Abnormal   Collection Time: 09/20/20  4:30 AM   Specimen: Nasal Mucosa; Nasal Swab  Result Value Ref Range Status   MRSA by PCR Next Gen DETECTED (A) NOT DETECTED Final    Comment: RESULT CALLED TO, READ BACK BY AND VERIFIED WITH: KAYLAN FREEMIRE@[email or web address]  ON 09/20/20 BY HKP (NOTE) The GeneXpert MRSA Assay (FDA approved for NASAL specimens only), is one component of a comprehensive MRSA colonization surveillance program. It is not intended to diagnose MRSA infection nor to guide or monitor treatment for MRSA infections. Test performance is not FDA approved in patients less than 66 years old. Performed at [facility], [address]  [place], [place] [address]   Aerobic/Anaerobic Culture w Gram Stain (surgical/deep wound)     Status: None (Preliminary result)   Collection Time: 09/20/20  3:11 PM   Specimen: Wound  Result Value Ref Range Status   Specimen Description   Final    WOUND Performed at [facility], [address] [place], [place] [address]    Special Requests   Final    FOOT Performed at [facility], [address] [place], [place] [address]    Gram Stain NO WBC SEEN NO ORGANISMS SEEN   Final   Culture   Final    NO GROWTH 2 DAYS NO ANAEROBES ISOLATED; CULTURE IN PROGRESS FOR 5 DAYS Performed at [facility], [address] [place], [place] [address]    Report Status PENDING  Incomplete         Radiology Studies: No results found.      Scheduled Meds:  acidophilus  1 capsule Oral Daily   amLODipine  10 mg Oral Daily   aspirin EC  81 mg Oral Daily   Chlorhexidine Gluconate Cloth  6 each Topical Q0600   clopidogrel  75 mg Oral Daily   DULoxetine  60 mg Oral BID   fluticasone  2 spray Each Nare Daily   fluticasone furoate-vilanterol  1 puff Inhalation Daily   folic acid  1 mg Oral Daily   gabapentin  300 mg Oral BID   heparin  5,000 Units Subcutaneous Q8H   insulin aspart  0-15 Units Subcutaneous TID WC   insulin aspart  0-5 Units Subcutaneous QHS   insulin aspart  4 Units Subcutaneous TID WC   insulin glargine-yfgn  20 Units Subcutaneous q morning   insulin glargine-yfgn  25 Units Subcutaneous QHS   isosorbide mononitrate  60 mg Oral Daily   levothyroxine  25 mcg Oral Q0600   loratadine  10 mg Oral Daily   melatonin  10 mg Oral QHS   metoprolol succinate  50 mg Oral Daily   multivitamin with minerals  1 tablet Oral Daily   mupirocin ointment  1 application Nasal BID   nutrition supplement (JUVEN)  1 packet Oral BID BM   pantoprazole  40 mg Oral Daily   polyethylene glycol  17 g Oral Daily   Ensure Max Protein  11 oz Oral Daily   senna-docusate  1  tablet Oral BID   simvastatin  5 mg Oral Daily   sodium chloride flush  3 mL Intravenous Q12H   Continuous Infusions:  sodium chloride 75 mL/hr at 09/23/20 0227   vancomycin Stopped (09/22/20  1709)      LOS: 3 days    Time spent: 25 minutes    Tresa Moore, MD [facility] Pager [phone]  If 7PM-7AM, please contact night-coverage 09/23/2020, 11:17 AM

## 2020-09-23 NOTE — [place] FL2 (Signed)
[place] MEDICAID FL2 LEVEL OF CARE SCREENING TOOL     IDENTIFICATION  Patient Name: TERRIONA HORLACHER Birthdate: 19-Mar-1955 Sex: female Admission Date (Current Location): 09/19/2020  [facility] and [place] Number:  Chiropodist and Address:  [facility], [address], [place], [place] [address]      Provider Number: [id number]  Attending Physician Name and Address:  Tresa Moore, MD  Relative Name and Phone Number:  Trisha Mangle (Friend)   [phone] (Mobile)    Current Level of Care: Hospital Recommended Level of Care: Skilled Nursing Facility Prior Approval Number:    Date Approved/Denied:   PASRR Number: [id number]  Discharge Plan: SNF    Current Diagnoses: Patient Active Problem List   Diagnosis Date Noted   Cellulitis 09/20/2020   Acute metabolic encephalopathy 03/29/2020   Primary hypothyroidism    Type 2 diabetes mellitus with stage 3 chronic kidney disease (HCC)    Acute lower UTI    Chest pain 07/04/2019   Furuncle of vulva 02/28/2019   Cellulitis of left lower extremity 12/31/2018   Diabetic ulcer of left lower leg associated with type 2 diabetes mellitus (HCC) 12/31/2018   Uncontrolled type 2 diabetes mellitus with hyperglycemia (HCC) 12/31/2018   Chronic pain syndrome 08/03/2017   Oxygen dependent 08/03/2017   Thrombocytopenia (HCC) 09/28/2015   Anemia in chronic kidney disease 09/28/2015   Diarrhea 11/28/2014   AKI (acute kidney injury) (HCC) 11/28/2014   COPD (chronic obstructive pulmonary disease) (HCC) 11/28/2014   Uncontrolled type 2 diabetes mellitus with hypoglycemia (HCC) 11/28/2014   HTN (hypertension) 11/28/2014    Orientation RESPIRATION BLADDER Height & Weight     Self, Time, Situation, Place  Normal External catheter, Incontinent Weight: (!) 326 lb (147.9 kg) Height:  5\' 7"  (170.2 cm)  BEHAVIORAL SYMPTOMS/MOOD NEUROLOGICAL BOWEL NUTRITION STATUS      Continent Diet (heart healthy/carb  modified)  AMBULATORY STATUS COMMUNICATION OF NEEDS Skin   Limited Assist Verbally Skin abrasions (celluitis)                       Personal Care Assistance Level of Assistance  Bathing, Feeding, Dressing Bathing Assistance: Limited assistance Feeding assistance: Independent Dressing Assistance: Limited assistance     Functional Limitations Info  Sight, Hearing, Speech Sight Info: Adequate Hearing Info: Adequate Speech Info: Adequate    SPECIAL CARE FACTORS FREQUENCY  PT (By licensed PT), OT (By licensed OT)     PT Frequency: 5 x/week OT Frequency: 5 x/week            Contractures Contractures Info: Not present    Additional Factors Info  Code Status, Allergies Code Status Info: DNR Allergies Info: nka           Current Medications (09/23/2020):  This is the current hospital active medication list Current Facility-Administered Medications  Medication Dose Route Frequency Provider Last Rate Last Admin   0.9 %  sodium chloride infusion   Intravenous Continuous 09/25/2020 B, MD 75 mL/hr at 09/23/20 0227 New Bag at 09/23/20 0227   acetaminophen (TYLENOL) tablet 650 mg  650 mg Oral Q6H PRN 09/25/20, MD       Or   acetaminophen (TYLENOL) suppository 650 mg  650 mg Rectal Q6H PRN Lurline Del, MD       acidophilus (RISAQUAD) capsule 1 capsule  1 capsule Oral Daily Lurline Del A, MD   1 capsule at 09/22/20 0935   albuterol (PROVENTIL) (2.5 MG/3ML)  0.083% nebulizer solution 2.5 mg  2.5 mg Nebulization Q2H PRN Skip Mayer A, MD       amLODipine (NORVASC) tablet 10 mg  10 mg Oral Daily Lolita Patella B, MD   10 mg at 09/22/20 6203   aspirin EC tablet 81 mg  81 mg Oral Daily Skip Mayer A, MD   81 mg at 09/22/20 0936   bisacodyl (DULCOLAX) suppository 10 mg  10 mg Rectal Daily PRN Lolita Patella B, MD       Chlorhexidine Gluconate Cloth 2 % PADS 6 each  6 each Topical Q0600 Lurline Del, MD   6 each at 09/22/20 0618    clopidogrel (PLAVIX) tablet 75 mg  75 mg Oral Daily Skip Mayer A, MD   75 mg at 09/22/20 0936   DULoxetine (CYMBALTA) DR capsule 60 mg  60 mg Oral BID Skip Mayer A, MD   60 mg at 09/22/20 2222   fluticasone (FLONASE) 50 MCG/ACT nasal spray 2 spray  2 spray Each Nare Daily Lurline Del, MD   2 spray at 09/22/20 0937   fluticasone furoate-vilanterol (BREO ELLIPTA) 200-25 MCG/INH 1 puff  1 puff Inhalation Daily Lurline Del, MD   1 puff at 09/22/20 5597   folic acid (FOLVITE) tablet 1 mg  1 mg Oral Daily Skip Mayer A, MD   1 mg at 09/22/20 0936   gabapentin (NEURONTIN) capsule 300 mg  300 mg Oral BID Lolita Patella B, MD   300 mg at 09/22/20 2222   heparin injection 5,000 Units  5,000 Units Subcutaneous Q8H Skip Mayer A, MD   5,000 Units at 09/23/20 0603   hydrALAZINE (APRESOLINE) injection 10 mg  10 mg Intravenous Q4H PRN Sreenath, Sudheer B, MD       insulin aspart (novoLOG) injection 0-15 Units  0-15 Units Subcutaneous TID WC Lolita Patella B, MD   3 Units at 09/22/20 1703   insulin aspart (novoLOG) injection 0-5 Units  0-5 Units Subcutaneous QHS Lolita Patella B, MD   3 Units at 09/22/20 2222   insulin aspart (novoLOG) injection 4 Units  4 Units Subcutaneous TID WC Lolita Patella B, MD   4 Units at 09/22/20 1705   insulin glargine-yfgn (SEMGLEE) injection 20 Units  20 Units Subcutaneous q morning Lolita Patella B, MD   20 Units at 09/22/20 1230   insulin glargine-yfgn (SEMGLEE) injection 25 Units  25 Units Subcutaneous QHS Lurline Del, MD   25 Units at 09/22/20 2221   isosorbide mononitrate (IMDUR) 24 hr tablet 60 mg  60 mg Oral Daily Lolita Patella B, MD   60 mg at 09/22/20 0936   levothyroxine (SYNTHROID) tablet 25 mcg  25 mcg Oral Q0600 Lolita Patella B, MD   25 mcg at 09/23/20 0603   loratadine (CLARITIN) tablet 10 mg  10 mg Oral Daily Skip Mayer A, MD   10 mg at 09/22/20 0936   melatonin tablet 10 mg  10 mg Oral  QHS Skip Mayer A, MD   10 mg at 09/22/20 2222   metoprolol succinate (TOPROL-XL) 24 hr tablet 50 mg  50 mg Oral Daily Skip Mayer A, MD   50 mg at 09/22/20 0936   multivitamin with minerals tablet 1 tablet  1 tablet Oral Daily Lurline Del, MD   1 tablet at 09/22/20 0936   mupirocin ointment (BACTROBAN) 2 % 1 application  1 application Nasal BID Lurline Del, MD   1 application at 09/22/20 2223   nitroGLYCERIN (NITROSTAT)  SL tablet 0.4 mg  0.4 mg Sublingual Q5 min PRN Lurline Del, MD       nutrition supplement (JUVEN) (JUVEN) powder packet 1 packet  1 packet Oral BID BM Lolita Patella B, MD   1 packet at 09/22/20 0938   ondansetron (ZOFRAN) tablet 4 mg  4 mg Oral Q6H PRN Lurline Del, MD       Or   ondansetron [facility]) injection 4 mg  4 mg Intravenous Q6H PRN Lurline Del, MD       oxyCODONE (Oxy IR/ROXICODONE) immediate release tablet 5 mg  5 mg Oral Q4H PRN Lolita Patella B, MD   5 mg at 09/20/20 1012   pantoprazole (PROTONIX) EC tablet 40 mg  40 mg Oral Daily Skip Mayer A, MD   40 mg at 09/22/20 0936   polyethylene glycol (MIRALAX / GLYCOLAX) packet 17 g  17 g Oral Daily Sreenath, Sudheer B, MD       protein supplement (ENSURE MAX) liquid  11 oz Oral Daily Lolita Patella B, MD   11 oz at 09/22/20 0932   senna-docusate (Senokot-S) tablet 1 tablet  1 tablet Oral BID Lolita Patella B, MD       simvastatin (ZOCOR) tablet 5 mg  5 mg Oral Daily Skip Mayer A, MD   5 mg at 09/22/20 0935   sodium chloride flush (NS) 0.9 % injection 3 mL  3 mL Intravenous Q12H Skip Mayer A, MD   3 mL at 09/22/20 2223   vancomycin (VANCOREADY) IVPB 2000 mg/400 mL  2,000 mg Intravenous Q48H Bettey Costa, RPH   Stopped at 09/22/20 1709     Discharge Medications: Please see discharge summary for a list of discharge medications.  Relevant Imaging Results:  Relevant Lab Results:   Additional Information SS #: [id number]  Kenyatta Keidel E  Keyan Folson, LCSW

## 2020-09-24 DIAGNOSIS — L0291 Cutaneous abscess, unspecified: Secondary | ICD-10-CM | POA: Diagnosis not present

## 2020-09-24 DIAGNOSIS — L03115 Cellulitis of right lower limb: Secondary | ICD-10-CM | POA: Diagnosis not present

## 2020-09-24 DIAGNOSIS — E1151 Type 2 diabetes mellitus with diabetic peripheral angiopathy without gangrene: Secondary | ICD-10-CM | POA: Diagnosis not present

## 2020-09-24 LAB — GLUCOSE, CAPILLARY
Glucose-Capillary: 135 mg/dL — ABNORMAL HIGH (ref 70–99)
Glucose-Capillary: 187 mg/dL — ABNORMAL HIGH (ref 70–99)
Glucose-Capillary: 233 mg/dL — ABNORMAL HIGH (ref 70–99)
Glucose-Capillary: 221 mg/dL — ABNORMAL HIGH (ref 70–99)

## 2020-09-24 LAB — CBC WITH DIFFERENTIAL/PLATELET
Abs Immature Granulocytes: 0.15 10*3/uL — ABNORMAL HIGH (ref 0.00–0.07)
Basophils Absolute: 0 10*3/uL (ref 0.0–0.1)
Basophils Relative: 1 %
Eosinophils Absolute: 0.2 10*3/uL (ref 0.0–0.5)
Eosinophils Relative: 3 %
HCT: 25.8 % — ABNORMAL LOW (ref 36.0–46.0)
Hemoglobin: 8.7 g/dL — ABNORMAL LOW (ref 12.0–15.0)
Immature Granulocytes: 2 %
Lymphocytes Relative: 16 %
Lymphs Abs: 1.1 10*3/uL (ref 0.7–4.0)
MCH: 30.3 pg (ref 26.0–34.0)
MCHC: 33.7 g/dL (ref 30.0–36.0)
MCV: 89.9 fL (ref 80.0–100.0)
Monocytes Absolute: 0.4 10*3/uL (ref 0.1–1.0)
Monocytes Relative: 6 %
Neutro Abs: 4.7 10*3/uL (ref 1.7–7.7)
Neutrophils Relative %: 72 %
Platelets: 150 10*3/uL (ref 150–400)
RBC: 2.87 MIL/uL — ABNORMAL LOW (ref 3.87–5.11)
RDW: 14.1 % (ref 11.5–15.5)
WBC: 6.6 10*3/uL (ref 4.0–10.5)
nRBC: 0 % (ref 0.0–0.2)

## 2020-09-24 LAB — BASIC METABOLIC PANEL
Anion gap: 12 (ref 5–15)
BUN: 58 mg/dL — ABNORMAL HIGH (ref 8–23)
CO2: 22 mmol/L (ref 22–32)
Calcium: 8.5 mg/dL — ABNORMAL LOW (ref 8.9–10.3)
Chloride: 107 mmol/L (ref 98–111)
Creatinine, Ser: 1.91 mg/dL — ABNORMAL HIGH (ref 0.44–1.00)
GFR, Estimated: 29 mL/min — ABNORMAL LOW (ref >60)
Glucose, Bld: 151 mg/dL — ABNORMAL HIGH (ref 70–99)
Potassium: 4.1 mmol/L (ref 3.5–5.1)
Sodium: 141 mmol/L (ref 135–145)

## 2020-09-24 MED ORDER — BISACODYL 10 MG RE SUPP
10.0000 mg | Freq: Every day | RECTAL | Status: DC | PRN
  Filled 2020-09-24: qty 1

## 2020-09-24 NOTE — Progress Notes (Signed)
PROGRESS NOTE    Deanna LittenSandra C Mantey  ZOX:[id number] DOB: 11/24/1955 DOA: 09/19/2020 PCP: Lyndon CodeKhan, Fozia M, MD   Brief Narrative:   65 year old woman with medical history significant for morbid obesity-BMI of 60, type 2 diabetes mellitus, OSA,COPD/chronic respiratory failure on 4 L home O2, chronic venous insufficiency, hypothyroidism, chronic diastolic CHF, chronic osteoarthritis, depression who 5 months ago was transitioned from ALF to SNF due to chronic debility and falls. Patient also has interim history of ED visit on 7/19 at which time patient was diagnosed with  cellulitis of there right foot with what appears to be small puncture like wound on plantar surface. Patient was prescribed bactrim and discharged back to SNF.Patient now returns to ed  one week later with progressive infection and right foot abscess. Patient denies any injury to foot, and noted foot is very tender with clear/yellow drainage. She denies any fever/chills/ n/v/diarrhea / chest pain or sob.   MRI completed which did demonstrate plantar aspect abscess with associated soft tissue swelling.  Study was limited by motion artifact but no obvious evidence of osteomyelitis.  Seen in consultation by podiatry.  No surgical intervention warranted.  Bedside debridement done.  No bedside I&D indicated at this time.  Patient is improving symptomatically.  Cultures with no growth at 3 days.  Reintubated for better growth   Assessment & Plan:   Active Problems:   Cellulitis  Right plantar diabetic foot ulcer with associated cellulitis and abscess Patient failed outpatient antibiotics MRI limited by motion artifact but negative for bone involvement Podiatry consulted, no surgical intervention warranted Podiatry did do bedside debridement Cultures with no growth Plan: Continue vancomycin while in-house Follow wound cultures, no growth to date Monitor vitals and fever curve Appreciate WOC consult Therapy evaluations, patient will  return to skilled nursing facility Anticipated medical readiness for discharge 8/1 Will transition to p.o. doxycycline at time of discharge  Constipation Moderate stool burden on KUB No evidence of obstruction Plan: Twice daily Senokot-S Daily MiraLAX Dulcolax suppository  AKI on CKD stage IIIa. Baseline creatinine appears to be 1.4 Presented with creatinine 2.43 ATN versus prerenal azotemia Improving Plan: Continue IVF for today Avoid nephrotoxins Monitor kidney function Pharmacy following for vancomycin dosing If kidney function continues to improve anticipate medical readiness for discharge 8/1  Insulin-dependent type 2 diabetes mellitus Recent hemoglobin A1c 8.0 Plan: Lantus 25 units nightly, 20 units every morning Lispro 4 units 3 times daily with meals Moderate sliding scale 3 times daily AC Nightly coverage Carb modified diet Diabetes coordinator consult  Essential hypertension, uncontrolled Continue home regimen of amlodipine, Imdur, metoprolol Uptitrate as necessary to achieve BP control Ensure pain control As needed IV hydralazine  COPD Chronic respiratory failure on 2L home oxygen Not currently exacerbated Continue bronchodilators and oxygen  Chronic venous insufficiency Likely secondary to uncontrolled diabetes  Hypothyroidism Patient was not on thyroid replacement TSH greater than 9 Free T4 0.76 Plan: Continue Synthroid 25 mcg daily  Obstructive sleep apnea Not on CPAP for unclear reasons  Chronic diastolic congestive heart failure Not acutely exacerbated Continue beta-blockade  Coronary artery disease Continue calcium channel blocker, Imdur, high intensity statin, as needed nitroglycerin  Osteoarthritis with chronic debility Pain control Therapy evaluations  Depression PTA SSRI  Morbid obesity BMI 51 Complicates care and prognosis Dietitian consult  Constipation Patient endorses several days without BM Associated abdominal  pain Initiate bowel regimen Check KUB   DVT prophylaxis: SQ Lovenox Code Status: Full Family Communication: None today.  Offered to call the patient  declined Disposition Plan: Status is: Inpatient  Remains inpatient appropriate because:Inpatient level of care appropriate due to severity of illness  Dispo: The patient is from: Home              Anticipated d/c is to: Home              Patient currently is not medically stable to d/c.   Difficult to place patient No  Plantar foot ulcer without evidence of acute osteomyelitis.  On IV antibiotics and pain control.  Podiatry consulted, recommendations appreciated.  AKI on CKD.  Kidney function improving.  If patient's kidney function continues to improve and she is able to have a bowel movement she can discharge back to skilled nursing facility 8/1.    Level of care: Med-Surg  Consultants:  Podiatry  Procedures:  None  Antimicrobials:  Vancomycin   Subjective: Patient seen and examined.  Constipation persists.  Pain well controlled.  No new complaints  Objective: Vitals:   09/23/20 2032 09/24/20 0443 09/24/20 0500 09/24/20 0755  BP: (!) 142/67 (!) 149/64  (!) 157/83  Pulse: 66 73  73  Resp: 16 16  18   Temp: 97.8 F (36.6 C) 97.7 F (36.5 C)  (!) 97.5 F (36.4 C)  TempSrc:    Oral  SpO2: 99% 99%  99%  Weight:   (!) 160.8 kg   Height:        Intake/Output Summary (Last 24 hours) at 09/24/2020 1101 Last data filed at 09/24/2020 0500 Gross per 24 hour  Intake 1711.94 ml  Output 3725 ml  Net -2013.06 ml   Filed Weights   09/19/20 1632 09/24/20 0500  Weight: (!) 147.9 kg (!) 160.8 kg    Examination:  General exam: No acute distress Respiratory system: Lungs clear.  Normal work of breathing.  2 L Cardiovascular system: S1-S2, regular rate and rhythm, no murmurs, no pedal edema  gastrointestinal system: Obese, soft, nondistended, mild TTP, normal bowel sounds  Central nervous system: Alert and oriented. No focal  neurological deficits. Extremities: 5/5 power symmetrically Skin: Cellulitic change on plantar aspect of right foot improving Psychiatry: Judgement and insight appear normal. Mood & affect appropriate.     Data Reviewed: I have personally reviewed following labs and imaging studies  CBC: Recent Labs  Lab 09/19/20 1637 09/20/20 0455 09/23/20 0629 09/24/20 0751  WBC 8.3 6.1 5.8 6.6  NEUTROABS 6.5  --  4.1 4.7  HGB 9.3* 9.0* 8.1* 8.7*  HCT 27.7* 27.1* 23.7* 25.8*  MCV 90.2 90.9 90.1 89.9  PLT 177 137* 160 150   Basic Metabolic Panel: Recent Labs  Lab 09/19/20 1637 09/20/20 0455 09/21/20 0451 09/22/20 1246 09/23/20 0629 09/24/20 0751  NA 135 138  --   --  135 141  K 4.6 4.4  --   --  4.2 4.1  CL 101 104  --   --  106 107  CO2 24 27  --   --  22 22  GLUCOSE 235* 213*  --   --  256* 151*  BUN 47* 41*  --   --  47* 58*  CREATININE 2.43* 2.45* 2.13* 2.01* 2.14* 1.91*  CALCIUM 8.1* 8.0*  --   --  7.9* 8.5*   GFR: Estimated Creatinine Clearance: 46.3 mL/min (A) (by C-G formula based on SCr of 1.91 mg/dL (H)). Liver Function Tests: Recent Labs  Lab 09/19/20 1637  AST 19  ALT 18  ALKPHOS 134*  BILITOT 0.5  PROT 6.5  ALBUMIN 2.6*  No results for input(s): LIPASE, AMYLASE in the last 168 hours. No results for input(s): AMMONIA in the last 168 hours. Coagulation Profile: No results for input(s): INR, PROTIME in the last 168 hours. Cardiac Enzymes: No results for input(s): CKTOTAL, CKMB, CKMBINDEX, TROPONINI in the last 168 hours. BNP (last 3 results) No results for input(s): PROBNP in the last 8760 hours. HbA1C: No results for input(s): HGBA1C in the last 72 hours. CBG: Recent Labs  Lab 09/23/20 0836 09/23/20 1215 09/23/20 1653 09/23/20 2118 09/24/20 0754  GLUCAP 212* 218* 230* 242* 135*   Lipid Profile: No results for input(s): CHOL, HDL, LDLCALC, TRIG, CHOLHDL, LDLDIRECT in the last 72 hours. Thyroid Function Tests: No results for input(s): TSH,  T4TOTAL, FREET4, T3FREE, THYROIDAB in the last 72 hours.  Anemia Panel: No results for input(s): VITAMINB12, FOLATE, FERRITIN, TIBC, IRON, RETICCTPCT in the last 72 hours. Sepsis Labs: Recent Labs  Lab 09/19/20 1750  LATICACIDVEN 1.0    Recent Results (from the past 240 hour(s))  Blood culture (single)     Status: None (Preliminary result)   Collection Time: 09/19/20  5:50 PM   Specimen: BLOOD  Result Value Ref Range Status   Specimen Description BLOOD BLOOD RIGHT FOREARM  Final   Special Requests   Final    BOTTLES DRAWN AEROBIC AND ANAEROBIC Blood Culture results may not be optimal due to an excessive volume of blood received in culture bottles   Culture   Final    NO GROWTH 3 DAYS Performed at [facility], [address], [place], [place] [address]    Report Status PENDING  Incomplete  Resp Panel by RT-PCR (Flu A&B, Covid) Nasopharyngeal Swab     Status: None   Collection Time: 09/19/20  5:50 PM   Specimen: Nasopharyngeal Swab; Nasopharyngeal(NP) swabs in vial transport medium  Result Value Ref Range Status   SARS Coronavirus 2 by RT PCR NEGATIVE NEGATIVE Final    Comment: (NOTE) SARS-CoV-2 target nucleic acids are NOT DETECTED.  The SARS-CoV-2 RNA is generally detectable in upper respiratory specimens during the acute phase of infection. The lowest concentration of SARS-CoV-2 viral copies this assay can detect is 138 copies/mL. A negative result does not preclude SARS-Cov-2 infection and should not be used as the sole basis for treatment or other patient management decisions. A negative result may occur with  improper specimen collection/handling, submission of specimen other than nasopharyngeal swab, presence of viral mutation(s) within the areas targeted by this assay, and inadequate number of viral copies(<138 copies/mL). A negative result must be combined with clinical observations, patient history, and epidemiological information. The expected result is  Negative.  Fact Sheet for Patients:  BloggerCourse.com  Fact Sheet for Healthcare Providers:  SeriousBroker.it  This test is no t yet approved or cleared by the [place] FDA and  has been authorized for detection and/or diagnosis of SARS-CoV-2 by FDA under an Emergency Use Authorization (EUA). This EUA will remain  in effect (meaning this test can be used) for the duration of the COVID-19 declaration under Section 564(b)(1) of the Act, 21 U.S.C.section 360bbb-3(b)(1), unless the authorization is terminated  or revoked sooner.       Influenza A by PCR NEGATIVE NEGATIVE Final   Influenza B by PCR NEGATIVE NEGATIVE Final    Comment: (NOTE) The Xpert Xpress SARS-CoV-2/FLU/RSV plus assay is intended as an aid in the diagnosis of influenza from Nasopharyngeal swab specimens and should not be used as a sole basis for treatment. Nasal washings and  aspirates are unacceptable for Xpert Xpress SARS-CoV-2/FLU/RSV testing.  Fact Sheet for Patients: BloggerCourse.com  Fact Sheet for Healthcare Providers: SeriousBroker.it  This test is not yet approved or cleared by the [place] FDA and has been authorized for detection and/or diagnosis of SARS-CoV-2 by FDA under an Emergency Use Authorization (EUA). This EUA will remain in effect (meaning this test can be used) for the duration of the COVID-19 declaration under Section 564(b)(1) of the Act, 21 U.S.C. section 360bbb-3(b)(1), unless the authorization is terminated or revoked.  Performed at [facility], [address] [place], [place] [address]   MRSA Next Gen by PCR, Nasal     Status: Abnormal   Collection Time: 09/20/20  4:30 AM   Specimen: Nasal Mucosa; Nasal Swab  Result Value Ref Range Status   MRSA by PCR Next Gen DETECTED (A) NOT DETECTED Final    Comment: RESULT CALLED TO, READ BACK BY AND VERIFIED  WITH: KAYLAN FREEMIRE@0612  ON 09/20/20 BY HKP (NOTE) The GeneXpert MRSA Assay (FDA approved for NASAL specimens only), is one component of a comprehensive MRSA colonization surveillance program. It is not intended to diagnose MRSA infection nor to guide or monitor treatment for MRSA infections. Test performance is not FDA approved in patients less than 45 years old. Performed at [facility], [address] [place], [place] [address]   Aerobic/Anaerobic Culture w Gram Stain (surgical/deep wound)     Status: None (Preliminary result)   Collection Time: 09/20/20  3:11 PM   Specimen: Wound  Result Value Ref Range Status   Specimen Description   Final    WOUND Performed at [facility], [address], [place], [place] [address]    Special Requests   Final    FOOT Performed at [facility], [address] [place], [place] [address]    Gram Stain NO WBC SEEN NO ORGANISMS SEEN   Final   Culture   Final    NO GROWTH 3 DAYS NO ANAEROBES ISOLATED; CULTURE IN PROGRESS FOR 5 DAYS Performed at [facility], [address], [place], [place] [address]    Report Status PENDING  Incomplete         Radiology Studies: DG Abd 1 View  Result Date: 09/23/2020 CLINICAL DATA:  Constipation EXAM: ABDOMEN - 1 VIEW COMPARISON:  December 21, 2004 FINDINGS: Lung bases are unremarkable. No free air, portal venous gas, or pneumatosis identified. Moderate fecal loading throughout the colon. Small bowel loops are nondilated. The sigmoid colon is mildly prominent and air-filled. IMPRESSION: 1. Moderate fecal loading throughout the colon. 2. Small bowel loops are normal in caliber. 3. The sigmoid colon is mildly prominent, likely due to an air-filled mildly redundant sigmoid colon. Electronically Signed   By: Gerome Sam III M.D   On: 09/23/2020 11:52        Scheduled Meds:  acidophilus  1 capsule Oral Daily   amLODipine  10 mg Oral Daily   aspirin EC  81 mg  Oral Daily   Chlorhexidine Gluconate Cloth  6 each Topical Q0600   clopidogrel  75 mg Oral Daily   DULoxetine  60 mg Oral BID   fluticasone  2 spray Each Nare Daily   fluticasone furoate-vilanterol  1 puff Inhalation Daily   folic acid  1 mg Oral Daily   gabapentin  300 mg Oral BID   heparin  5,000 Units Subcutaneous Q8H   insulin aspart  0-15 Units Subcutaneous TID WC   insulin aspart  0-5 Units Subcutaneous QHS   insulin aspart  7 Units Subcutaneous TID WC   insulin glargine-yfgn  20 Units Subcutaneous q morning   insulin glargine-yfgn  25 Units Subcutaneous QHS   isosorbide mononitrate  60 mg Oral Daily   levothyroxine  25 mcg Oral Q0600   loratadine  10 mg Oral Daily   melatonin  10 mg Oral QHS   metoprolol succinate  75 mg Oral Daily   multivitamin with minerals  1 tablet Oral Daily   nutrition supplement (JUVEN)  1 packet Oral BID BM   pantoprazole  40 mg Oral Daily   polyethylene glycol  17 g Oral Daily   Ensure Max Protein  11 oz Oral Daily   senna-docusate  1 tablet Oral BID   simvastatin  5 mg Oral Daily   sodium chloride flush  3 mL Intravenous Q12H   Continuous Infusions:  sodium chloride 75 mL/hr at 09/24/20 0512   vancomycin Stopped (09/22/20 1709)      LOS: 4 days    Time spent: 25 minutes    Tresa Moore, MD [facility] Pager [phone]  If 7PM-7AM, please contact night-coverage 09/24/2020, 11:01 AM

## 2020-09-24 NOTE — TOC Progression Note (Signed)
Transition of Care [facility]) - Progression Note    Patient Details  Name: Deanna Rice MRN: [id number] Date of Birth: [birth year]  Transition of [facility]) CM/SW Contact  Liliana Cline, LCSW Phone Number: 09/24/2020, 11:20 AM  Clinical Narrative:   Notified Tanya with [facility] that patient will likely be medically ready for discharge tomorrow. MD has ordered COVID test. TOC to continue to follow.    Expected Discharge Plan: Skilled Nursing Facility Barriers to Discharge: Continued Medical Work up  Expected Discharge Plan and Services Expected Discharge Plan: Skilled Nursing Facility   Discharge Planning Services: CM Consult Post Acute Care Choice: Skilled Nursing Facility (Resident of [facility]) Living arrangements for the past 2 months: Skilled Nursing Facility                                       Social Determinants of Health (SDOH) Interventions    Readmission Risk Interventions Readmission Risk Prevention Plan 09/21/2020 03/31/2020  Transportation Screening Complete Complete  Palliative Care Screening - Not Applicable  Medication Review (RN Care Manager) Complete Complete  PCP or Specialist appointment within 3-5 days of discharge Complete -  HRI or Home Care Consult Complete -  SW Recovery Care/Counseling Consult Not Complete -  SW Consult Not Complete Comments RNCM assigned -  Palliative Care Screening Not Applicable -  Skilled Nursing Facility Complete -  Some recent data might be hidden

## 2020-09-25 DIAGNOSIS — L02611 Cutaneous abscess of right foot: Secondary | ICD-10-CM | POA: Diagnosis not present

## 2020-09-25 DIAGNOSIS — L97411 Non-pressure chronic ulcer of right heel and midfoot limited to breakdown of skin: Secondary | ICD-10-CM | POA: Diagnosis not present

## 2020-09-25 DIAGNOSIS — Z66 Do not resuscitate: Secondary | ICD-10-CM | POA: Diagnosis not present

## 2020-09-25 DIAGNOSIS — F32A Depression, unspecified: Secondary | ICD-10-CM | POA: Diagnosis not present

## 2020-09-25 DIAGNOSIS — N170 Acute kidney failure with tubular necrosis: Secondary | ICD-10-CM | POA: Diagnosis not present

## 2020-09-25 DIAGNOSIS — J9611 Chronic respiratory failure with hypoxia: Secondary | ICD-10-CM | POA: Diagnosis not present

## 2020-09-25 DIAGNOSIS — J449 Chronic obstructive pulmonary disease, unspecified: Secondary | ICD-10-CM | POA: Diagnosis not present

## 2020-09-25 DIAGNOSIS — E1122 Type 2 diabetes mellitus with diabetic chronic kidney disease: Secondary | ICD-10-CM | POA: Diagnosis not present

## 2020-09-25 DIAGNOSIS — M109 Gout, unspecified: Secondary | ICD-10-CM | POA: Diagnosis not present

## 2020-09-25 DIAGNOSIS — E039 Hypothyroidism, unspecified: Secondary | ICD-10-CM | POA: Diagnosis not present

## 2020-09-25 DIAGNOSIS — Z8249 Family history of ischemic heart disease and other diseases of the circulatory system: Secondary | ICD-10-CM | POA: Diagnosis not present

## 2020-09-25 DIAGNOSIS — I5032 Chronic diastolic (congestive) heart failure: Secondary | ICD-10-CM | POA: Diagnosis not present

## 2020-09-25 DIAGNOSIS — E782 Mixed hyperlipidemia: Secondary | ICD-10-CM | POA: Diagnosis not present

## 2020-09-25 DIAGNOSIS — L03115 Cellulitis of right lower limb: Secondary | ICD-10-CM | POA: Diagnosis not present

## 2020-09-25 DIAGNOSIS — Z20822 Contact with and (suspected) exposure to covid-19: Secondary | ICD-10-CM | POA: Diagnosis not present

## 2020-09-25 DIAGNOSIS — E1142 Type 2 diabetes mellitus with diabetic polyneuropathy: Secondary | ICD-10-CM | POA: Diagnosis not present

## 2020-09-25 DIAGNOSIS — I130 Hypertensive heart and chronic kidney disease with heart failure and stage 1 through stage 4 chronic kidney disease, or unspecified chronic kidney disease: Secondary | ICD-10-CM | POA: Diagnosis not present

## 2020-09-25 DIAGNOSIS — N1831 Chronic kidney disease, stage 3a: Secondary | ICD-10-CM | POA: Diagnosis not present

## 2020-09-25 DIAGNOSIS — Z9981 Dependence on supplemental oxygen: Secondary | ICD-10-CM | POA: Diagnosis not present

## 2020-09-25 DIAGNOSIS — Z833 Family history of diabetes mellitus: Secondary | ICD-10-CM | POA: Diagnosis not present

## 2020-09-25 DIAGNOSIS — E11621 Type 2 diabetes mellitus with foot ulcer: Secondary | ICD-10-CM | POA: Diagnosis not present

## 2020-09-25 DIAGNOSIS — Z6841 Body Mass Index (BMI) 40.0 and over, adult: Secondary | ICD-10-CM | POA: Diagnosis not present

## 2020-09-25 DIAGNOSIS — L0291 Cutaneous abscess, unspecified: Secondary | ICD-10-CM | POA: Diagnosis present

## 2020-09-25 DIAGNOSIS — E1151 Type 2 diabetes mellitus with diabetic peripheral angiopathy without gangrene: Secondary | ICD-10-CM | POA: Diagnosis not present

## 2020-09-25 LAB — GLUCOSE, CAPILLARY
Glucose-Capillary: 169 mg/dL — ABNORMAL HIGH (ref 70–99)
Glucose-Capillary: 243 mg/dL — ABNORMAL HIGH (ref 70–99)

## 2020-09-25 LAB — BASIC METABOLIC PANEL WITH GFR
Anion gap: 9 (ref 5–15)
BUN: 68 mg/dL — ABNORMAL HIGH (ref 8–23)
CO2: 21 mmol/L — ABNORMAL LOW (ref 22–32)
Calcium: 8.2 mg/dL — ABNORMAL LOW (ref 8.9–10.3)
Chloride: 106 mmol/L (ref 98–111)
Creatinine, Ser: 1.9 mg/dL — ABNORMAL HIGH (ref 0.44–1.00)
GFR, Estimated: 29 mL/min — ABNORMAL LOW
Glucose, Bld: 187 mg/dL — ABNORMAL HIGH (ref 70–99)
Potassium: 4.1 mmol/L (ref 3.5–5.1)
Sodium: 136 mmol/L (ref 135–145)

## 2020-09-25 LAB — AEROBIC/ANAEROBIC CULTURE W GRAM STAIN (SURGICAL/DEEP WOUND)
Culture: NO GROWTH
Gram Stain: NONE SEEN

## 2020-09-25 LAB — RESP PANEL BY RT-PCR (FLU A&B, COVID) ARPGX2
Influenza A by PCR: NEGATIVE
Influenza B by PCR: NEGATIVE
SARS Coronavirus 2 by RT PCR: NEGATIVE

## 2020-09-25 MED ORDER — DOXYCYCLINE HYCLATE 100 MG PO TABS: 100.0000 mg | ORAL_TABLET | Freq: Two times a day (BID) | ORAL | 0 refills | Status: AC

## 2020-09-25 MED ORDER — COVID-19 MRNA VACC (MODERNA) 50 MCG/0.25ML IM SUSP
0.2500 mL | Freq: Once | INTRAMUSCULAR | Status: AC
  Administered 2020-09-25: 0.25 mL via INTRAMUSCULAR
  Filled 2020-09-25: qty 0.25

## 2020-09-25 MED ORDER — LEVOTHYROXINE SODIUM 25 MCG PO TABS: 25.0000 ug | ORAL_TABLET | Freq: Every day | ORAL | Status: AC

## 2020-09-25 MED ORDER — DOXYCYCLINE HYCLATE 100 MG PO TABS
100.0000 mg | ORAL_TABLET | Freq: Two times a day (BID) | ORAL | Status: DC
  Administered 2020-09-25: 13:00:00 100 mg via ORAL
  Filled 2020-09-25: qty 1

## 2020-09-25 NOTE — Progress Notes (Signed)
Occupational Therapy Treatment Patient Details Name: Deanna Rice MRN: [id number] DOB: 11-26-1955 Today's Date: 09/25/2020    History of present illness 65 y.o. female admitted with abcess of R foot; now s/p debridement of ulcers. Medical history significant for morbid obesity, type 2 diabetes mellitus, OSA,COPD/chronic respiratory failure on 2 L home O2, chronic venous insufficiency, hypothyroidism, chronic diastolic CHF, chronic osteoarthritis, depression who 5 months ago was transitioned from ALF to SNF due to chronic debility and falls.   OT comments  Deanna Rice is making good progress toward her functional goals.  Pt was pleasant and agreeable to OT treatment, but declined out of bed mobility despite encouragement from OT.  Pt was receptive to session focused on education re: self-care and fall prevention strategies post-discharge.  OT provided written and verbal education re: adaptive equipment for lower body dressing, fall prevention strategies, self-care considerations, and mobility/orthotic recommendations s/p R foot ulcer debridement.  Pt asked appropriate questions throughout and verbalized understanding of education provided.  Deanna Rice will continue to benefit from skilled OT services in acute setting to address functional strengthening, safety, and independence in ADLs.  SNF remains most appropriate discharge recommendation at this time.   Follow Up Recommendations  SNF    Equipment Recommendations  None recommended by OT    Recommendations for Other Services      Precautions / Restrictions Precautions Precautions: Fall Required Braces or Orthoses: Other Brace Other Brace: post-op shoe RLE Restrictions Weight Bearing Restrictions: Yes RLE Weight Bearing: Non weight bearing Other Position/Activity Restrictions: per podiatry note ambulate with heel contact only       Mobility Bed Mobility Overal bed mobility: Needs Assistance                  Transfers                       Balance Overall balance assessment: Mild deficits observed, not formally tested;Needs assistance                                         ADL either performed or assessed with clinical judgement   ADL Overall ADL's : Needs assistance/impaired Eating/Feeding: Set up   Grooming: Set up;Sitting           Upper Body Dressing : Minimal assistance                           Vision Patient Visual Report: No change from baseline     Perception     Praxis      Cognition Arousal/Alertness: Awake/alert Behavior During Therapy: WFL for tasks assessed/performed Overall Cognitive Status: Within Functional Limits for tasks assessed                                 General Comments: Pt A&O x4        Exercises Other Exercises Other Exercises: provided education re: lower body dressing, adaptive equipment, mobility precautions, fall and safety precautions   Shoulder Instructions       General Comments      Pertinent Vitals/ Pain       Pain Assessment: No/denies pain  Home Living  Prior Functioning/Environment              Frequency  Min 1X/week        Progress Toward Goals  OT Goals(current goals can now be found in the care plan section)  Progress towards OT goals: Progressing toward goals  Acute Rehab OT Goals Patient Stated Goal: to get stronger OT Goal Formulation: With patient Time For Goal Achievement: 10/05/20 Potential to Achieve Goals: Good  Plan Discharge plan remains appropriate;Frequency remains appropriate    Co-evaluation                 AM-PAC OT "6 Clicks" Daily Activity     Outcome Measure   Help from another person eating meals?: None Help from another person taking care of personal grooming?: A Little Help from another person toileting, which includes using toliet, bedpan, or urinal?: A Lot Help from another  person bathing (including washing, rinsing, drying)?: A Little Help from another person to put on and taking off regular upper body clothing?: A Little Help from another person to put on and taking off regular lower body clothing?: A Lot 6 Click Score: 17    End of Session    OT Visit Diagnosis: Unsteadiness on feet (R26.81);Muscle weakness (generalized) (M62.81)   Activity Tolerance Patient tolerated treatment well   Patient Left     Nurse Communication          Time: 2355-7322 OT Time Calculation (min): 17 min  Charges: OT General Charges $OT Visit: 1 Visit OT Treatments $Self Care/Home Management : 8-22 mins  Dennison Nancy, OTR/L 09/25/20, 9:23 AM

## 2020-09-25 NOTE — Progress Notes (Addendum)
Attempted to call facility to provide report, no report. Daily dressing changes written on front of discharge papers and to use post op shoe to right foot     [facility]: Skilled Nursing  Address: [address], [place] [address]  Phone: [phone]

## 2020-09-25 NOTE — TOC Progression Note (Signed)
Transition of Care [facility]) - Progression Note    Patient Details  Name: Deanna Rice MRN: [id number] Date of Birth: 1956/01/07  Transition [facility] Care [facility]) CM/SW Contact  Caryn Section, RN Phone Number: 09/25/2020, 10:56 AM  Clinical Narrative:   Patient ready for transport to [facility] today at 1430 by [facility].  Confirmed to be accepted at [facility] by Archie Patten. Patient and care team aware of transport.    Expected Discharge Plan: Skilled Nursing Facility Barriers to Discharge: Continued Medical Work up  Expected Discharge Plan and Services Expected Discharge Plan: Skilled Nursing Facility   Discharge Planning Services: CM Consult Post Acute Care Choice: Skilled Nursing Facility (Resident of [facility]) Living arrangements for the past 2 months: Skilled Nursing Facility Expected Discharge Date: 09/25/20                                     Social Determinants of Health (SDOH) Interventions    Readmission Risk Interventions Readmission Risk Prevention Plan 09/21/2020 03/31/2020  Transportation Screening Complete Complete  Palliative Care Screening - Not Applicable  Medication Review (RN Care Manager) Complete Complete  PCP or Specialist appointment within 3-5 days of discharge Complete -  HRI or Home Care Consult Complete -  SW Recovery Care/Counseling Consult Not Complete -  SW Consult Not Complete Comments RNCM assigned -  Palliative Care Screening Not Applicable -  Skilled Nursing Facility Complete -  Some recent data might be hidden

## 2020-09-25 NOTE — Discharge Summary (Signed)
Physician Discharge Summary  Deanna Rice UYQ:[id number] DOB: 02-24-1956 DOA: 09/19/2020  PCP: Lavera Guise, MD  Admit date: 09/19/2020 Discharge date: 09/25/2020  Admitted From: SNF, [facility] Disposition: SNF, [facility]  Recommendations for Outpatient Follow-up:  Follow up with PCP in 1-2 weeks Follow-up with podiatry as directed  Home Health: No Equipment/Devices: Oxygen 2 L  Discharge Condition: Stable CODE STATUS: DNR Diet recommendation: Heart healthy/carb modified  Brief/Interim Summary: 65 year old woman with medical history significant for morbid obesity-BMI of 60, type 2 diabetes mellitus, OSA,COPD/chronic respiratory failure on 4 L home O2, chronic venous insufficiency, hypothyroidism, chronic diastolic CHF, chronic osteoarthritis, depression who 5 months ago was transitioned from ALF to SNF due to chronic debility and falls. Patient also has interim history of ED visit on 7/19 at which time patient was diagnosed with  cellulitis of there right foot with what appears to be small puncture like wound on plantar surface. Patient was prescribed bactrim and discharged back to SNF.Patient now returns to ed  one week later with progressive infection and right foot abscess. Patient denies any injury to foot, and noted foot is very tender with clear/yellow drainage. She denies any fever/chills/ n/v/diarrhea / chest pain or sob.    MRI completed which did demonstrate plantar aspect abscess with associated soft tissue swelling.  Study was limited by motion artifact but no obvious evidence of osteomyelitis.   Seen in consultation by podiatry.  No surgical intervention warranted.  Bedside debridement done.  No bedside I&D indicated at this time.  Patient is improving symptomatically.  Cultures with no growth at 3 days.  Reintubated for better growth.  Reintubated cultures pending at time of discharge.  Patient hemodynamically stable.  Will transition to doxycycline for MRSA  coverage.  Complete additional 5 days for total 10-day antibiotic course.  Follow-up PCP 1 week.  Follow-up podiatry 2 to 3 weeks.  Stable for discharge.  Will return to [facility].  Discharge Diagnoses:  Active Problems:   Cellulitis  Right plantar diabetic foot ulcer with associated cellulitis and abscess Patient failed outpatient antibiotics MRI limited by motion artifact but negative for bone involvement Podiatry consulted, no surgical intervention warranted Podiatry did do bedside debridement Cultures with no growth Plan: Discharge [facility].  Vancomycin discontinued.  Reintubated cultures pending at time of discharge.  Treat with empiric doxycycline for MRSA coverage.  Treat additional 5 days for total 10-day antibiotic course.  Stable for discharge.  Will return to [facility].  Wound care instructions included in discharge summary packet.  Follow-up podiatry 2 to 3 weeks.   Constipation, resolved Moderate stool burden on KUB No evidence of obstruction    AKI on CKD stage IIIa. Baseline creatinine appears to be 1.4 Presented with creatinine 2.43 ATN versus prerenal azotemia Improving Kidney function approaching baseline at time of discharge   Insulin-dependent type 2 diabetes mellitus Recent hemoglobin A1c 8.0 Resume home regimen on discharge   Essential hypertension, uncontrolled Continue home regimen of amlodipine, Imdur, metoprolol    COPD Chronic respiratory failure on 2L home oxygen Not currently exacerbated Continue bronchodilators and oxygen   Chronic venous insufficiency Likely secondary to uncontrolled diabetes   Hypothyroidism Patient was not on thyroid replacement TSH greater than 9 Free T4 0.76 Plan: Continue Synthroid 25 mcg daily   Obstructive sleep apnea Not on CPAP for unclear reasons   Chronic diastolic congestive heart failure Not acutely exacerbated Continue beta-blockade   Coronary artery disease Continue  calcium channel blocker, Imdur, high intensity statin,  as needed nitroglycerin   Osteoarthritis with chronic debility Pain control Therapy evaluations   Depression PTA SSRI   Morbid obesity BMI 51 Complicates care and prognosis Dietitian consult    Discharge Instructions   Allergies as of 09/25/2020   No Known Allergies      Medication List     STOP taking these medications    silver sulfADIAZINE 1 % cream Commonly known as: SILVADENE   sulfamethoxazole-trimethoprim 800-160 MG tablet Commonly known as: BACTRIM DS       TAKE these medications    amLODipine 5 MG tablet Commonly known as: NORVASC Take 1 tablet (5 mg total) by mouth daily.   aspirin EC 81 MG tablet Take 1 tablet (81 mg total) by mouth daily.   Bacid Caps Take 1 tablet by mouth daily.   Basaglar KwikPen 100 UNIT/ML Inject 18 units subcutaneously in the morning and 25 units subcutaneously at bedtime   doxycycline 100 MG tablet Commonly known as: VIBRA-TABS Take 1 tablet (100 mg total) by mouth every 12 (twelve) hours for 5 days.   fluticasone 50 MCG/ACT nasal spray Commonly known as: FLONASE PLACE 2 SPRAYS INTO BOTH NOSTRILS DAILY   Global Ease Inject Pen Needles 32G X 4 MM Misc Generic drug: Insulin Pen Needle Use  As directed   isosorbide mononitrate 30 MG 24 hr tablet Commonly known as: IMDUR TAKE 1 TABLET BY MOUTH DAILY   levothyroxine 25 MCG tablet Commonly known as: SYNTHROID Take 1 tablet (25 mcg total) by mouth daily at 6 (six) AM. Start taking on: September 26, 2020   Melatonin 10 MG Tabs Take 10 mg by mouth at bedtime.   metoprolol succinate 25 MG 24 hr tablet Commonly known as: TOPROL-XL Take 2 tablets (50 mg total) by mouth daily.   Microlet Lancets Misc by Does not apply route 4 (four) times daily. DX e11.65   multivitamin with minerals tablet Take 1 tablet by mouth daily.   nitroGLYCERIN 0.4 MG/SPRAY spray Commonly known as: NITROLINGUAL PLACE 1 SPRAY UNDER THE  TONGUE EVERY 5 MINUTES FOR 3 DOSES AS NEEDED FOR CHEST PAIN.   nystatin powder Commonly known as: MYCOSTATIN/NYSTOP Apply 1 application topically 3 (three) times daily.   ONE TOUCH ULTRA 2 w/Device Kit by Does not apply route. Use as directed DX e11.65   OneTouch Ultra test strip Generic drug: glucose blood 1 each by Other route 4 (four) times daily. DX e11.65   OXYGEN Inhale into the lungs. Inhale 4 liters into lungs continously   simvastatin 10 MG tablet Commonly known as: ZOCOR Take 5 mg by mouth daily.   tetrahydrozoline 0.05 % ophthalmic solution Place 1 drop into both eyes daily. For minor eye irritation   traMADol 50 MG tablet Commonly known as: ULTRAM Take 25 mg by mouth every 6 (six) hours as needed for moderate pain or severe pain.       ASK your doctor about these medications    Advair Diskus 250-50 MCG/ACT Aepb Generic drug: fluticasone-salmeterol INHALE 1 PUFF BY MOUTH INTO THE LUNGS 2 TIMES A DAY   allopurinol 100 MG tablet Commonly known as: ZYLOPRIM TAKE 1 TABLET BY MOUTH DAILY   clopidogrel 75 MG tablet Commonly known as: PLAVIX TAKE 1 TABLET BY MOUTH DAILY   Combivent Respimat 20-100 MCG/ACT Aers respimat Generic drug: Ipratropium-Albuterol Inhale 1 puff into the lungs every 6 (six) hours as needed for wheezing or shortness of breath.   diclofenac sodium 1 % Gel Commonly known as: VOLTAREN Apply 2 g  topically 3 (three) times daily as needed (Pain in left leg).   DULoxetine 60 MG capsule Commonly known as: CYMBALTA TAKE ONE CAPSULE BY MOUTH TWICE A DAY   folic acid 1 MG tablet Commonly known as: FOLVITE TAKE 1 TABLET BY MOUTH DAILY   loratadine 10 MG tablet Commonly known as: CLARITIN TAKE 1 TABLET BY MOUTH DAILY   NovoLOG FlexPen 100 UNIT/ML FlexPen Generic drug: insulin aspart Use as directed with sliding scale maximum 63 units no more   omeprazole 40 MG capsule Commonly known as: PRILOSEC TAKE 1 CAPSULE BY MOUTH DAILY    Victoza 18 MG/3ML Sopn Generic drug: liraglutide INJECT 1.8 MG EVERY MORNING        No Known Allergies  Consultations: Podiatry   Procedures/Studies: DG Abd 1 View  Result Date: 09/23/2020 CLINICAL DATA:  Constipation EXAM: ABDOMEN - 1 VIEW COMPARISON:  December 21, 2004 FINDINGS: Lung bases are unremarkable. No free air, portal venous gas, or pneumatosis identified. Moderate fecal loading throughout the colon. Small bowel loops are nondilated. The sigmoid colon is mildly prominent and air-filled. IMPRESSION: 1. Moderate fecal loading throughout the colon. 2. Small bowel loops are normal in caliber. 3. The sigmoid colon is mildly prominent, likely due to an air-filled mildly redundant sigmoid colon. Electronically Signed   By: Dorise Bullion III M.D   On: 09/23/2020 11:52   MR FOOT RIGHT WO CONTRAST  Result Date: 09/20/2020 CLINICAL DATA:  Foot pain and swelling 3 months. Diabetic. EXAM: MRI OF THE RIGHT FOREFOOT WITHOUT CONTRAST TECHNIQUE: Multiplanar, multisequence MR imaging of the right foot was performed. No intravenous contrast was administered. COMPARISON:  Radiograph 09/19/2020 FINDINGS: Examination is quite limited due to patient motion. Subcutaneous soft tissue swelling/edema/fluid consistent with cellulitis. I do not see a discrete fluid collection to suggest a drainable soft tissue abscess. Mild changes of myositis but no evidence of pyomyositis. No MR findings suspicious for septic arthritis or osteomyelitis. IMPRESSION: 1. Very limited examination due to patient motion. 2. Cellulitis and myositis but no evidence of drainable soft tissue abscess or pyomyositis. 3. No MR findings for septic arthritis or osteomyelitis. Electronically Signed   By: Marijo Sanes M.D.   On: 09/20/2020 08:26   US Venous Img Lower Bilateral  Result Date: 09/12/2020 CLINICAL DATA:  Chronic bilateral lower extremity edema and pain. EXAM: BILATERAL LOWER EXTREMITY VENOUS DOPPLER ULTRASOUND TECHNIQUE:  Gray-scale sonography with graded compression, as well as color Doppler and duplex ultrasound were performed to evaluate the lower extremity deep venous systems from the level of the common femoral vein and including the common femoral, femoral, profunda femoral, popliteal and calf veins including the posterior tibial, peroneal and gastrocnemius veins when visible. The superficial great saphenous vein was also interrogated. Spectral Doppler was utilized to evaluate flow at rest and with distal augmentation maneuvers in the common femoral, femoral and popliteal veins. COMPARISON:  December 17, 2011. FINDINGS: RIGHT LOWER EXTREMITY Common Femoral Vein: No evidence of thrombus. Normal compressibility, respiratory phasicity and response to augmentation. Saphenofemoral Junction: No evidence of thrombus. Normal compressibility and flow on color Doppler imaging. Profunda Femoral Vein: No evidence of thrombus. Normal compressibility and flow on color Doppler imaging. Femoral Vein: No evidence of thrombus. Normal compressibility, respiratory phasicity and response to augmentation. Popliteal Vein: No evidence of thrombus. Normal compressibility, respiratory phasicity and response to augmentation. Calf Veins: No evidence of thrombus. Normal compressibility and flow on color Doppler imaging. Venous Reflux:  None. Other Findings:  None. LEFT LOWER EXTREMITY Common Femoral Vein: No  evidence of thrombus. Normal compressibility, respiratory phasicity and response to augmentation. Saphenofemoral Junction: No evidence of thrombus. Normal compressibility and flow on color Doppler imaging. Profunda Femoral Vein: No evidence of thrombus. Normal compressibility and flow on color Doppler imaging. Femoral Vein: No evidence of thrombus. Normal compressibility, respiratory phasicity and response to augmentation. Popliteal Vein: No evidence of thrombus. Normal compressibility, respiratory phasicity and response to augmentation. Calf Veins: No  evidence of thrombus. Normal compressibility and flow on color Doppler imaging. Venous Reflux:  None. Other Findings:  None. IMPRESSION: No evidence of deep venous thrombosis in either lower extremity. Electronically Signed   By: Marijo Conception M.D.   On: 09/12/2020 19:44   DG Foot Complete Right  Result Date: 09/19/2020 CLINICAL DATA:  65 year old female with right foot infection. EXAM: RIGHT FOOT COMPLETE - 3+ VIEW COMPARISON:  Right foot radiograph dated 09/12/2020 FINDINGS: There is no acute fracture or dislocation. The bones are osteopenic. No bone erosion or periosteal elevation. There is diffuse subcutaneous edema. No radiopaque foreign object or soft tissue gas. IMPRESSION: 1. No acute fracture or dislocation. No radiographic evidence of acute osteomyelitis. 2. Diffuse subcutaneous edema. Electronically Signed   By: Anner Crete M.D.   On: 09/19/2020 17:46   DG Foot Complete Right  Result Date: 09/12/2020 CLINICAL DATA:  Foot infection. Unable hold still due to pain. Wound infection right foot. History of MRSA. EXAM: RIGHT FOOT COMPLETE - 3+ VIEW COMPARISON:  None. FINDINGS: There is no evidence of fracture or dislocation. No cortical erosion or destruction. There is no evidence of severe arthropathy or other focal bone abnormality. Distal leg subcutaneus soft tissue edema. Dorsum of foot subcutaneus soft tissue edema. IMPRESSION: 1. No radiographic findings of osteomyelitis. 2.  No acute displaced fracture or dislocation. Electronically Signed   By: Iven Finn M.D.   On: 09/12/2020 18:40   (Echo, Carotid, EGD, Colonoscopy, ERCP)    Subjective: Seen and examined on the day of discharge.  Stable no distress.  Stable for discharge.  Will return to [facility].  Discharge Exam: Vitals:   09/25/20 0358 09/25/20 0740  BP: (!) 162/73 (!) 163/84  Pulse: 83 71  Resp: 18 18  Temp: 97.9 F (36.6 C) 97.8 F (36.6 C)  SpO2: 97% 100%   Vitals:   09/24/20 1937 09/25/20 0358  09/25/20 0431 09/25/20 0740  BP: (!) 163/84 (!) 162/73  (!) 163/84  Pulse: 79 83  71  Resp: (!) _0 Temp: 98.4 F (36.9 C) 97.9 F (36.6 C)  97.8 F (36.6 C)  TempSrc: Oral Oral    SpO2: 100% 97%  100%  Weight:   (!) 162.8 kg   Height:        General: Pt is alert, awake, not in acute distress Cardiovascular: RRR, S1/S2 +, no rubs, no gallops Respiratory: CTA bilaterally, no wheezing, no rhonchi Abdominal: Soft, NT, ND, bowel sounds + Extremities: Right foot pain improved, cellulitic change improved.    The results of significant diagnostics from this hospitalization (including imaging, microbiology, ancillary and laboratory) are listed below for reference.     Microbiology: Recent Results (from the past 240 hour(s))  Blood culture (single)     Status: None (Preliminary result)   Collection Time: 09/19/20  5:50 PM   Specimen: BLOOD  Result Value Ref Range Status   Specimen Description BLOOD BLOOD RIGHT FOREARM  Final   Special Requests   Final    BOTTLES DRAWN AEROBIC AND ANAEROBIC Blood Culture results  may not be optimal due to an excessive volume of blood received in culture bottles   Culture   Final    NO GROWTH 3 DAYS Performed at [facility], [address] [place], [place] [address]    Report Status PENDING  Incomplete  Resp Panel by RT-PCR (Flu A&B, Covid) Nasopharyngeal Swab     Status: None   Collection Time: 09/19/20  5:50 PM   Specimen: Nasopharyngeal Swab; Nasopharyngeal(NP) swabs in vial transport medium  Result Value Ref Range Status   SARS Coronavirus 2 by RT PCR NEGATIVE NEGATIVE Final    Comment: (NOTE) SARS-CoV-2 target nucleic acids are NOT DETECTED.  The SARS-CoV-2 RNA is generally detectable in upper respiratory specimens during the acute phase of infection. The lowest concentration of SARS-CoV-2 viral copies this assay can detect is 138 copies/mL. A negative result does not preclude SARS-Cov-2 infection and should not be used  as the sole basis for treatment or other patient management decisions. A negative result may occur with  improper specimen collection/handling, submission of specimen other than nasopharyngeal swab, presence of viral mutation(s) within the areas targeted by this assay, and inadequate number of viral copies(<138 copies/mL). A negative result must be combined with clinical observations, patient history, and epidemiological information. The expected result is Negative.  Fact Sheet for Patients:  EntrepreneurPulse.com.au  Fact Sheet for Healthcare Providers:  IncredibleEmployment.be  This test is no t yet approved or cleared by the [place] FDA and  has been authorized for detection and/or diagnosis of SARS-CoV-2 by FDA under an Emergency Use Authorization (EUA). This EUA will remain  in effect (meaning this test can be used) for the duration of the COVID-19 declaration under Section 564(b)(1) of the Act, 21 U.S.C.section 360bbb-3(b)(1), unless the authorization is terminated  or revoked sooner.       Influenza A by PCR NEGATIVE NEGATIVE Final   Influenza B by PCR NEGATIVE NEGATIVE Final    Comment: (NOTE) The Xpert Xpress SARS-CoV-2/FLU/RSV plus assay is intended as an aid in the diagnosis of influenza from Nasopharyngeal swab specimens and should not be used as a sole basis for treatment. Nasal washings and aspirates are unacceptable for Xpert Xpress SARS-CoV-2/FLU/RSV testing.  Fact Sheet for Patients: EntrepreneurPulse.com.au  Fact Sheet for Healthcare Providers: IncredibleEmployment.be  This test is not yet approved or cleared by the [place] FDA and has been authorized for detection and/or diagnosis of SARS-CoV-2 by FDA under an Emergency Use Authorization (EUA). This EUA will remain in effect (meaning this test can be used) for the duration of the COVID-19 declaration under Section 564(b)(1)  of the Act, 21 U.S.C. section 360bbb-3(b)(1), unless the authorization is terminated or revoked.  Performed at [facility], [address] [place], [place] [address]   MRSA Next Gen by PCR, Nasal     Status: Abnormal   Collection Time: 09/20/20  4:30 AM   Specimen: Nasal Mucosa; Nasal Swab  Result Value Ref Range Status   MRSA by PCR Next Gen DETECTED (A) NOT DETECTED Final    Comment: RESULT CALLED TO, READ BACK BY AND VERIFIED WITH: KAYLAN [email or web address]  ON 09/20/20 BY HKP (NOTE) The GeneXpert MRSA Assay (FDA approved for NASAL specimens only), is one component of a comprehensive MRSA colonization surveillance program. It is not intended to diagnose MRSA infection nor to guide or monitor treatment for MRSA infections. Test performance is not FDA approved in patients less than 79 years old. Performed at [facility], [address] [place],   [place] [address]   Aerobic/Anaerobic Culture w Gram Stain (surgical/deep wound)     Status: None (Preliminary result)   Collection Time: 09/20/20  3:11 PM   Specimen: Wound  Result Value Ref Range Status   Specimen Description   Final    WOUND Performed at [facility], [address], [place], [place] [address]    Special Requests   Final    FOOT Performed at [facility], [address] [place], [place] [address]    Gram Stain NO WBC SEEN NO ORGANISMS SEEN   Final   Culture   Final    NO GROWTH 4 DAYS NO ANAEROBES ISOLATED; CULTURE IN PROGRESS FOR 5 DAYS Performed at [facility], [address], [place], [place] [address]    Report Status PENDING  Incomplete  Resp Panel by RT-PCR (Flu A&B, Covid) Nasopharyngeal Swab     Status: None   Collection Time: 09/24/20 11:50 PM   Specimen: Nasopharyngeal Swab; Nasopharyngeal(NP) swabs in vial transport medium  Result Value Ref Range Status   SARS Coronavirus 2 by RT PCR NEGATIVE NEGATIVE Final    Comment: (NOTE) SARS-CoV-2 target nucleic  acids are NOT DETECTED.  The SARS-CoV-2 RNA is generally detectable in upper respiratory specimens during the acute phase of infection. The lowest concentration of SARS-CoV-2 viral copies this assay can detect is 138 copies/mL. A negative result does not preclude SARS-Cov-2 infection and should not be used as the sole basis for treatment or other patient management decisions. A negative result may occur with  improper specimen collection/handling, submission of specimen other than nasopharyngeal swab, presence of viral mutation(s) within the areas targeted by this assay, and inadequate number of viral copies(<138 copies/mL). A negative result must be combined with clinical observations, patient history, and epidemiological information. The expected result is Negative.  Fact Sheet for Patients:  EntrepreneurPulse.com.au  Fact Sheet for Healthcare Providers:  IncredibleEmployment.be  This test is no t yet approved or cleared by the [place] FDA and  has been authorized for detection and/or diagnosis of SARS-CoV-2 by FDA under an Emergency Use Authorization (EUA). This EUA will remain  in effect (meaning this test can be used) for the duration of the COVID-19 declaration under Section 564(b)(1) of the Act, 21 U.S.C.section 360bbb-3(b)(1), unless the authorization is terminated  or revoked sooner.       Influenza A by PCR NEGATIVE NEGATIVE Final   Influenza B by PCR NEGATIVE NEGATIVE Final    Comment: (NOTE) The Xpert Xpress SARS-CoV-2/FLU/RSV plus assay is intended as an aid in the diagnosis of influenza from Nasopharyngeal swab specimens and should not be used as a sole basis for treatment. Nasal washings and aspirates are unacceptable for Xpert Xpress SARS-CoV-2/FLU/RSV testing.  Fact Sheet for Patients: EntrepreneurPulse.com.au  Fact Sheet for Healthcare Providers: IncredibleEmployment.be  This  test is not yet approved or cleared by the [place] FDA and has been authorized for detection and/or diagnosis of SARS-CoV-2 by FDA under an Emergency Use Authorization (EUA). This EUA will remain in effect (meaning this test can be used) for the duration of the COVID-19 declaration under Section 564(b)(1) of the Act, 21 U.S.C. section 360bbb-3(b)(1), unless the authorization is terminated or revoked.  Performed at [facility], [address] [place], [place] [address]      Labs: BNP (last 3 results) Recent Labs    03/29/20 0636  BNP 5,956.3*   Basic Metabolic Panel: Recent Labs  Lab 09/19/20 1637 09/20/20 0455 09/21/20 0451  09/22/20 1246 09/23/20 0629 09/24/20 0751  NA 135 138  --   --  135 141  K 4.6 4.4  --   --  4.2 4.1  CL 101 104  --   --  106 107  CO2 24 27  --   --  22 22  GLUCOSE 235* 213*  --   --  256* 151*  BUN 47* 41*  --   --  47* 58*  CREATININE 2.43* 2.45* 2.13* 2.01* 2.14* 1.91*  CALCIUM 8.1* 8.0*  --   --  7.9* 8.5*   Liver Function Tests: Recent Labs  Lab 09/19/20 1637  AST 19  ALT 18  ALKPHOS 134*  BILITOT 0.5  PROT 6.5  ALBUMIN 2.6*   No results for input(s): LIPASE, AMYLASE in the last 168 hours. No results for input(s): AMMONIA in the last 168 hours. CBC: Recent Labs  Lab 09/19/20 1637 09/20/20 0455 09/23/20 0629 09/24/20 0751  WBC 8.3 6.1 5.8 6.6  NEUTROABS 6.5  --  4.1 4.7  HGB 9.3* 9.0* 8.1* 8.7*  HCT 27.7* 27.1* 23.7* 25.8*  MCV 90.2 90.9 90.1 89.9  PLT 177 137* 160 150   Cardiac Enzymes: No results for input(s): CKTOTAL, CKMB, CKMBINDEX, TROPONINI in the last 168 hours. BNP: Invalid input(s): POCBNP CBG: Recent Labs  Lab 09/24/20 0754 09/24/20 1147 09/24/20 1539 09/24/20 1935 09/25/20 0740  GLUCAP 135* 187* 233* 221* 169*   D-Dimer No results for input(s): DDIMER in the last 72 hours. Hgb A1c No results for input(s): HGBA1C in the last 72 hours. Lipid Profile No results for input(s): CHOL,  HDL, LDLCALC, TRIG, CHOLHDL, LDLDIRECT in the last 72 hours. Thyroid function studies No results for input(s): TSH, T4TOTAL, T3FREE, THYROIDAB in the last 72 hours.  Invalid input(s): FREET3 Anemia work up No results for input(s): VITAMINB12, FOLATE, FERRITIN, TIBC, IRON, RETICCTPCT in the last 72 hours. Urinalysis    Component Value Date/Time   COLORURINE YELLOW (A) 03/29/2020 0948   APPEARANCEUR CLEAR (A) 03/29/2020 0948   LABSPEC 1.010 03/29/2020 0948   PHURINE 5.0 03/29/2020 0948   GLUCOSEU NEGATIVE 03/29/2020 0948   HGBUR SMALL (A) 03/29/2020 0948   BILIRUBINUR NEGATIVE 03/29/2020 0948   KETONESUR NEGATIVE 03/29/2020 0948   PROTEINUR >=300 (A) 03/29/2020 0948   NITRITE NEGATIVE 03/29/2020 0948   LEUKOCYTESUR NEGATIVE 03/29/2020 0948   Sepsis Labs Invalid input(s): PROCALCITONIN,  WBC,  LACTICIDVEN Microbiology Recent Results (from the past 240 hour(s))  Blood culture (single)     Status: None (Preliminary result)   Collection Time: 09/19/20  5:50 PM   Specimen: BLOOD  Result Value Ref Range Status   Specimen Description BLOOD BLOOD RIGHT FOREARM  Final   Special Requests   Final    BOTTLES DRAWN AEROBIC AND ANAEROBIC Blood Culture results may not be optimal due to an excessive volume of blood received in culture bottles   Culture   Final    NO GROWTH 3 DAYS Performed at [facility], [address] [place], [place] [address]    Report Status PENDING  Incomplete  Resp Panel by RT-PCR (Flu A&B, Covid) Nasopharyngeal Swab     Status: None   Collection Time: 09/19/20  5:50 PM   Specimen: Nasopharyngeal Swab; Nasopharyngeal(NP) swabs in vial transport medium  Result Value Ref Range Status   SARS Coronavirus 2 by RT PCR NEGATIVE NEGATIVE Final    Comment: (NOTE) SARS-CoV-2 target nucleic acids are NOT DETECTED.  The SARS-CoV-2 RNA is generally detectable in upper  respiratory specimens during the acute phase of infection. The lowest concentration of SARS-CoV-2  viral copies this assay can detect is 138 copies/mL. A negative result does not preclude SARS-Cov-2 infection and should not be used as the sole basis for treatment or other patient management decisions. A negative result may occur with  improper specimen collection/handling, submission of specimen other than nasopharyngeal swab, presence of viral mutation(s) within the areas targeted by this assay, and inadequate number of viral copies(<138 copies/mL). A negative result must be combined with clinical observations, patient history, and epidemiological information. The expected result is Negative.  Fact Sheet for Patients:  EntrepreneurPulse.com.au  Fact Sheet for Healthcare Providers:  IncredibleEmployment.be  This test is no t yet approved or cleared by the [place] FDA and  has been authorized for detection and/or diagnosis of SARS-CoV-2 by FDA under an Emergency Use Authorization (EUA). This EUA will remain  in effect (meaning this test can be used) for the duration of the COVID-19 declaration under Section 564(b)(1) of the Act, 21 U.S.C.section 360bbb-3(b)(1), unless the authorization is terminated  or revoked sooner.       Influenza A by PCR NEGATIVE NEGATIVE Final   Influenza B by PCR NEGATIVE NEGATIVE Final    Comment: (NOTE) The Xpert Xpress SARS-CoV-2/FLU/RSV plus assay is intended as an aid in the diagnosis of influenza from Nasopharyngeal swab specimens and should not be used as a sole basis for treatment. Nasal washings and aspirates are unacceptable for Xpert Xpress SARS-CoV-2/FLU/RSV testing.  Fact Sheet for Patients: EntrepreneurPulse.com.au  Fact Sheet for Healthcare Providers: IncredibleEmployment.be  This test is not yet approved or cleared by the [place] FDA and has been authorized for detection and/or diagnosis of SARS-CoV-2 by FDA under an Emergency Use Authorization  (EUA). This EUA will remain in effect (meaning this test can be used) for the duration of the COVID-19 declaration under Section 564(b)(1) of the Act, 21 U.S.C. section 360bbb-3(b)(1), unless the authorization is terminated or revoked.  Performed at [facility], [address] [place], [place] [address]   MRSA Next Gen by PCR, Nasal     Status: Abnormal   Collection Time: 09/20/20  4:30 AM   Specimen: Nasal Mucosa; Nasal Swab  Result Value Ref Range Status   MRSA by PCR Next Gen DETECTED (A) NOT DETECTED Final    Comment: RESULT CALLED TO, READ BACK BY AND VERIFIED WITH: KAYLAN [email or web address]  ON 09/20/20 BY HKP (NOTE) The GeneXpert MRSA Assay (FDA approved for NASAL specimens only), is one component of a comprehensive MRSA colonization surveillance program. It is not intended to diagnose MRSA infection nor to guide or monitor treatment for MRSA infections. Test performance is not FDA approved in patients less than 75 years old. Performed at [facility], [address], [place], [place] [address]   Aerobic/Anaerobic Culture w Gram Stain (surgical/deep wound)     Status: None (Preliminary result)   Collection Time: 09/20/20  3:11 PM   Specimen: Wound  Result Value Ref Range Status   Specimen Description   Final    WOUND Performed at [facility], [address], [place], [place] [address]    Special Requests   Final    FOOT Performed at [facility], [address] [place],  [address]    Gram Stain NO WBC SEEN NO ORGANISMS SEEN   Final   Culture   Final    NO GROWTH 4 DAYS NO ANAEROBES ISOLATED; CULTURE IN PROGRESS FOR 5 DAYS Performed  at [facility], [place] [address] [place], [place] [address]    Report Status PENDING  Incomplete  Resp Panel by RT-PCR (Flu A&B, Covid) Nasopharyngeal Swab     Status: None   Collection Time: 09/24/20 11:50 PM   Specimen: Nasopharyngeal Swab; Nasopharyngeal(NP) swabs in vial transport  medium  Result Value Ref Range Status   SARS Coronavirus 2 by RT PCR NEGATIVE NEGATIVE Final    Comment: (NOTE) SARS-CoV-2 target nucleic acids are NOT DETECTED.  The SARS-CoV-2 RNA is generally detectable in upper respiratory specimens during the acute phase of infection. The lowest concentration of SARS-CoV-2 viral copies this assay can detect is 138 copies/mL. A negative result does not preclude SARS-Cov-2 infection and should not be used as the sole basis for treatment or other patient management decisions. A negative result may occur with  improper specimen collection/handling, submission of specimen other than nasopharyngeal swab, presence of viral mutation(s) within the areas targeted by this assay, and inadequate number of viral copies(<138 copies/mL). A negative result must be combined with clinical observations, patient history, and epidemiological information. The expected result is Negative.  Fact Sheet for Patients:  EntrepreneurPulse.com.au  Fact Sheet for Healthcare Providers:  IncredibleEmployment.be  This test is no t yet approved or cleared by the [place] FDA and  has been authorized for detection and/or diagnosis of SARS-CoV-2 by FDA under an Emergency Use Authorization (EUA). This EUA will remain  in effect (meaning this test can be used) for the duration of the COVID-19 declaration under Section 564(b)(1) of the Act, 21 U.S.C.section 360bbb-3(b)(1), unless the authorization is terminated  or revoked sooner.       Influenza A by PCR NEGATIVE NEGATIVE Final   Influenza B by PCR NEGATIVE NEGATIVE Final    Comment: (NOTE) The Xpert Xpress SARS-CoV-2/FLU/RSV plus assay is intended as an aid in the diagnosis of influenza from Nasopharyngeal swab specimens and should not be used as a sole basis for treatment. Nasal washings and aspirates are unacceptable for Xpert Xpress SARS-CoV-2/FLU/RSV testing.  Fact Sheet for  Patients: EntrepreneurPulse.com.au  Fact Sheet for Healthcare Providers: IncredibleEmployment.be  This test is not yet approved or cleared by the [place] FDA and has been authorized for detection and/or diagnosis of SARS-CoV-2 by FDA under an Emergency Use Authorization (EUA). This EUA will remain in effect (meaning this test can be used) for the duration of the COVID-19 declaration under Section 564(b)(1) of the Act, 21 U.S.C. section 360bbb-3(b)(1), unless the authorization is terminated or revoked.  Performed at [facility], [address], [place], [place] [address]      Time coordinating discharge: Over 30 minutes  SIGNED:   Sidney Ace, MD  [facility] 09/25/2020, 8:38 AM Pager   If 7PM-7AM, please contact night-coverage

## 2020-09-26 LAB — CULTURE, BLOOD (SINGLE): Culture: NO GROWTH

## 2020-10-27 NOTE — Telephone Encounter (Signed)
I have been trying to get Deanna Rice scheduled for an appointment.  I've called several times with no success.  It's hard to reach a person.  We received a call from the on-call physician at the ER on her last visit there.  He stated we need to get Deanna Rice in for an appointment for follow-up on an abscess.  So, I've been trying to call and get her scheduled.  "Who have you spoken to?"  I spoke to Deanna Rice.  She asked me to call back the following morning."  I don't know why she told you that, she's the one who does the scheduling."  I hope the patient is okay.  "She's still here so I guess she's doing okay.  I'll give Deanna Rice your message personally and have her call and schedule the appointment."

## 2020-11-07 ENCOUNTER — Encounter (INDEPENDENT_AMBULATORY_CARE_PROVIDER_SITE_OTHER): Payer: Self-pay

## 2020-11-07 ENCOUNTER — Ambulatory Visit (INDEPENDENT_AMBULATORY_CARE_PROVIDER_SITE_OTHER): Payer: Medicare Other | Admitting: Podiatry

## 2020-11-07 ENCOUNTER — Other Ambulatory Visit: Payer: Self-pay

## 2020-11-07 DIAGNOSIS — L97512 Non-pressure chronic ulcer of other part of right foot with fat layer exposed: Secondary | ICD-10-CM

## 2020-11-07 DIAGNOSIS — E08621 Diabetes mellitus due to underlying condition with foot ulcer: Secondary | ICD-10-CM | POA: Diagnosis not present

## 2020-11-07 NOTE — Progress Notes (Signed)
   HPI: 65 y.o. female presenting today PMHx diabetes mellitus type 2 presenting today as a referral for evaluation of an ulcer that developed to the plantar arch of the patient's right foot.  Patient states that since the referral was placed the wound is actually healed.  She presents today in a wheelchair.  She does state that she is very ambulatory and walks daily.  She presents for further treatment and evaluation  Past Medical History:  Diagnosis Date   Anemia, unspecified    CHF (congestive heart failure) (HCC)    Chronic ischemic heart disease, unspecified    COPD (chronic obstructive pulmonary disease) (HCC)    Dependence on supplemental oxygen    Diabetes mellitus without complication (HCC)    Gout, unspecified    Hypertension    Mixed hyperlipidemia    Obstructive chronic bronchitis without exacerbation (HCC)    Obstructive sleep apnea syndrome    Peripheral venous insufficiency    Primary generalized hypertrophic osteoarthrosis    Primary hypothyroidism    Severe recurrent major depression with psychotic features [facility])      Physical Exam: General: The patient is alert and oriented x3 in no acute distress.  Dermatology: Skin is warm, dry and supple bilateral lower extremities. Negative for open lesions or macerations.  The area of concern regarding the diabetic foot ulcer appears to be completely healed.  There is no evidence of an open wound noted with normal integument  Vascular: Palpable pedal pulses bilaterally.  Edema noted bilateral lower extremities capillary refill within normal limits.  Neurological: Epicritic and protective threshold diminished bilaterally.   Musculoskeletal Exam: No pedal deformity noted   Assessment: 1.  Diabetic foot ulcer right; healed   Plan of Care:  1. Patient evaluated.  Comprehensive diabetic foot exam performed today 2.  Advised the patient to check her feet daily and maintain good foot hygiene 3.  Stressed the importance of  regulating and controlling her blood glucose levels 4.  Return to clinic as needed      Felecia Shelling, DPM [facility]  Dr. Felecia Shelling, DPM    [address] [place], [place] [address]                Office [phone]  Fax [phone]

## 2021-03-26 ENCOUNTER — Non-Acute Institutional Stay: Payer: Medicare Other | Admitting: Nurse Practitioner

## 2021-03-26 ENCOUNTER — Other Ambulatory Visit: Payer: Self-pay

## 2021-03-26 ENCOUNTER — Encounter: Payer: Self-pay | Admitting: Nurse Practitioner

## 2021-03-26 VITALS — BP 160/83 | HR 93 | Temp 97.8°F | Resp 101 | Wt >= 6400 oz

## 2021-03-26 DIAGNOSIS — R0602 Shortness of breath: Secondary | ICD-10-CM

## 2021-03-26 DIAGNOSIS — J449 Chronic obstructive pulmonary disease, unspecified: Secondary | ICD-10-CM

## 2021-03-26 DIAGNOSIS — I509 Heart failure, unspecified: Secondary | ICD-10-CM

## 2021-03-26 DIAGNOSIS — Z515 Encounter for palliative care: Secondary | ICD-10-CM

## 2021-03-26 NOTE — Progress Notes (Signed)
[facility] Consult Note Telephone: [phone]  Fax: [phone]   Date of encounter: 03/26/21 9:10 PM PATIENT NAME: Deanna Rice [id number]   [phone] (home)  DOB: Apr 20, 1955 MRN: [id number]  Primary provider: [facility] Deanna Rice, [place],  [place] [address] [phone]  Referral provider: [place]:   self Contact Information     Name Relation Home Work Mobile   [name] Sister   [phone]      I met face to face with patient in facility. Palliative Care was asked to follow this patient by consultation request of  Deanna Rice to address advance care planning and complex medical decision making. This is the initial visit.                   ASSESSMENT AND PLAN / RECOMMENDATIONS:  Advance Care Planning/Goals of Care: Goals include to maximize quality of life and symptom management. Patient/health care surrogate gave his/her permission to discuss.Our advance care planning conversation included a discussion about:    The value and importance of advance care planning  Experiences with loved ones who have been seriously ill or have died  Exploration of personal, cultural or spiritual beliefs that might influence medical decisions  Exploration of goals of care in the event of a sudden injury or illness  Identification  of a healthcare agent  Review and updating or creation of an  advance directive document . Decision not to resuscitate or to de-escalate disease focused treatments due to poor prognosis. CODE STATUS: DNR  Symptom Management/Plan: 1. Advance Care Planning; DNR; wishes are to stay at facility, minimize hospitalizations  2. Goals of Care: Goals include to maximize quality of life and symptom management. Our advance care planning conversation included a discussion about:     The value and importance of advance care planning  Exploration of personal, cultural or spiritual beliefs that might influence medical decisions  Exploration of goals of care in the event of a sudden injury or illness  Identification and preparation of a healthcare agent  Review and updating or creation of an advance directive document.  3. Palliative care encounter; Palliative care encounter; Palliative medicine team will continue to support patient, patient's family, and medical team. Visit consisted of counseling and education dealing with the complex and emotionally intense issues of symptom management and palliative care in the setting of serious and potentially life-threatening illness  4. Shortness of breath secondary to severe obesity, OSA,COPD/chronic respiratory failure on 4 L home O2, CHF, COPD, will continue to monitor respiratory, inhalation therapy. Discussed medical goals.   5. f/u 2 weeks for ongoing monitoring chronic disease progression, ongoing discussions complex medical decision making  Follow up Palliative Care Visit: Palliative care will continue to follow for complex medical decision making, advance care planning, and clarification of goals. Return 2 weeks or prn.  I spent 80 minutes providing this consultation. More than 50% of the time in this consultation was spent in counseling and care coordination. PPS: 40%  Chief Complaint: Initial consul Palliative consult for complex medical decision making  HISTORY OF PRESENT ILLNESS:  Deanna Rice is a 66 y.o. year old female  with multiple medical problems including morbid obesity-BMI of 39, type 2 diabetes mellitus, OSA,COPD/chronic respiratory failure on 4 L home O2, chronic venous insufficiency, hypothyroidism, chronic diastolic CHF, CAD, CKD, chronic osteoarthritis, depression who 12  months ago was transitioned from ALF to SNF due to chronic debility and falls. Last hospitalization 09/19/2020 for Right plantar diabetic  foot ulcer with associated cellulitis and abscess, AKI on CKD, constipation, chronic venous insufficiency, uncontrolled DM, COPD with chronic respiratory failure on O2. Deanna Rice resides LTC facility at [facility]. Deanna Rice is able to sit on the side of the bed though it takes >20 minutes. Deanna Rice does takes a few steps with a walker then has to sit down due to increase in shortness of breath. Deanna Rice requires to be bathed, dressed due to increase in fatigue, shortness of breath. Deanna Rice has a good appetite, feeds herself. Deanna Rice is a DNR> Deanna Rice has chronic BLE edema with intermit cellulitis. I visited and observed Deanna Rice. Ms Rice makes eye contact, interactive. We talked about ros, symptoms, past medical history, symptoms. We talked about hospitalization which lead to STR transitioned to LTC. We talked about chronic disease progression. We talked about challenges with shortness of breath, worsening with exertion. Deanna Rice endorses she is able to take a few steps from bed into the bathroom, then has to rest before going back to bed. We talked about O. We talked about functional decline within the last year. We talked about life review, did not marry, no children. 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 We talked about medical goals, quality of life. We talked about realistic expectations. We talked about coping strategies. We talked about role pc in [place], Deanna Rice endorses she does have a sister who does call her. Therapeutic listening, emotional support provided. Questions answered. Briefly discussed Hospice ;benefit through Medicare. Will have Hospice Physicians review case for eligibility. I updated staff.   History obtained from review of EMR, discussion with primary team, and interview with family, facility staff/caregiver and/or Deanna Rice.  I reviewed available labs, medications, imaging, studies and related documents from the EMR.  Records reviewed and summarized above.    ROS 10 point system reviewed with Deanna Rice and staff, all negative except HPI  Physical Exam: Constitutional: NAD General: obese, chronically ill, pleasant female EYES: lids intact ENMT: oral mucous membranes moist CV: S1S2, RRR, +BLE edema Pulmonary: decrease throughout, + increased work of breathing with exertion, +chronic cough, O2 Abdomen:  normo-active BS + 4 quadrants, soft and non tender MSK: few steps with walker Skin: warm and dry Neuro:  + generalized weakness,  no cognitive impairment Psych: non-anxious affect, A and O x 3  CURRENT PROBLEM LIST:  Patient Active Problem List   Diagnosis Date Noted   Cellulitis 40/34/7425   Acute metabolic encephalopathy 95/63/8756   Primary hypothyroidism    Type 2 diabetes mellitus with stage 3 chronic kidney disease ([place])    Acute lower UTI    Chest pain 07/04/2019   Furuncle of vulva 02/28/2019   Cellulitis of left lower extremity 12/31/2018   Diabetic ulcer of left lower leg associated with type 2 diabetes mellitus ([place]) 12/31/2018   Uncontrolled type 2 diabetes mellitus with hyperglycemia ([place]) 12/31/2018   Chronic pain syndrome 08/03/2017   Oxygen dependent 08/03/2017   Thrombocytopenia ([place]) 09/28/2015   Anemia in chronic kidney disease 09/28/2015   Diarrhea 11/28/2014   AKI (acute kidney injury) ([place]) 11/28/2014   COPD (chronic obstructive pulmonary disease) ([place]) 11/28/2014   Uncontrolled type 2 diabetes mellitus with hypoglycemia ([place]) 11/28/2014   HTN (hypertension) 11/28/2014   PAST MEDICAL HISTORY:  Active Ambulatory Problems    Diagnosis Date Noted  Diarrhea 11/28/2014   AKI (acute kidney injury) ([place]) 11/28/2014   COPD (chronic obstructive pulmonary disease) ([place]) 11/28/2014   Uncontrolled type 2 diabetes mellitus with hypoglycemia ([place]) 11/28/2014   HTN (hypertension) 11/28/2014   Thrombocytopenia ([place]) 09/28/2015   Anemia in chronic kidney disease 09/28/2015   Chronic pain syndrome 08/03/2017   Oxygen  dependent 08/03/2017   Cellulitis of left lower extremity 12/31/2018   Diabetic ulcer of left lower leg associated with type 2 diabetes mellitus ([place]) 12/31/2018   Uncontrolled type 2 diabetes mellitus with hyperglycemia ([place]) 12/31/2018   Furuncle of vulva 02/28/2019   Chest pain 94/17/4081   Acute metabolic encephalopathy 44/81/8563   Primary hypothyroidism    Type 2 diabetes mellitus with stage 3 chronic kidney disease ([place])    Acute lower UTI    Cellulitis 09/20/2020   Resolved Ambulatory Problems    Diagnosis Date Noted   No Resolved Ambulatory Problems   Past Medical History:  Diagnosis Date   Anemia, unspecified    CHF (congestive heart failure) (HCC)    Chronic ischemic heart disease, unspecified    Dependence on supplemental oxygen    Diabetes mellitus without complication (HCC)    Gout, unspecified    Hypertension    Mixed hyperlipidemia    Obstructive chronic bronchitis without exacerbation (HCC)    Obstructive sleep apnea syndrome    Peripheral venous insufficiency    Primary generalized hypertrophic osteoarthrosis    Severe recurrent major depression with psychotic features ([place])    SOCIAL HX:  Social History   Tobacco Use   Smoking status: Former   Smokeless tobacco: Never  Substance Use Topics   Alcohol use: No   FAMILY HX:  Family History  Problem Relation Age of Onset   Hypertension Mother    Diabetes Mother    CAD Mother    CAD Father    Diabetes Father    Hypertension Father    Hypertension Sister    Diabetes Sister    CAD Sister       ALLERGIES: No Known Allergies   PERTINENT MEDICATIONS:  Outpatient Encounter Medications as of 03/26/2021  Medication Sig   ADVAIR DISKUS 250-50 MCG/DOSE AEPB INHALE 1 PUFF BY MOUTH INTO THE LUNGS 2 TIMES A DAY (Patient taking differently: Inhale 1 puff into the lungs in the morning and at bedtime.)   allopurinol (ZYLOPRIM) 100 MG tablet TAKE 1 TABLET BY MOUTH DAILY (Patient taking differently: Take 100 mg  by mouth daily.)   amLODipine (NORVASC) 5 MG tablet Take 1 tablet (5 mg total) by mouth daily.   aspirin EC 81 MG tablet Take 1 tablet (81 mg total) by mouth daily.   azithromycin (ZITHROMAX) 500 MG tablet Take 500 mg by mouth daily.   Blood Glucose Monitoring Suppl (ONE TOUCH ULTRA 2) w/Device KIT by Does not apply route. Use as directed DX e11.65   cefpodoxime (VANTIN) 200 MG tablet Take 200 mg by mouth 2 (two) times daily.   cephALEXin (KEFLEX) 500 MG capsule Take 500 mg by mouth 4 (four) times daily.   clopidogrel (PLAVIX) 75 MG tablet TAKE 1 TABLET BY MOUTH DAILY (Patient taking differently: Take 75 mg by mouth daily.)   dexamethasone (DECADRON) 4 MG tablet Take 4 mg by mouth daily as needed.   diclofenac sodium (VOLTAREN) 1 % GEL Apply 2 g topically 3 (three) times daily as needed (Pain in left leg). (Patient not taking: No sig reported)   diclofenac Sodium (VOLTAREN) 1 % GEL Apply topically.  doxycycline (VIBRA-TABS) 100 MG tablet Take 100 mg by mouth 2 (two) times daily.   DULoxetine (CYMBALTA) 60 MG capsule TAKE ONE CAPSULE BY MOUTH TWICE A DAY (Patient taking differently: Take 60 mg by mouth 2 (two) times daily.)   fluticasone (FLONASE) 50 MCG/ACT nasal spray PLACE 2 SPRAYS INTO BOTH NOSTRILS DAILY   folic acid (FOLVITE) 1 MG tablet TAKE 1 TABLET BY MOUTH DAILY (Patient taking differently: Take 1 mg by mouth daily.)   glucose blood (ONETOUCH ULTRA) test strip 1 each by Other route 4 (four) times daily. DX e11.65   insulin aspart (NOVOLOG FLEXPEN) 100 UNIT/ML FlexPen Use as directed with sliding scale maximum 63 units no more (Patient taking differently: Inject into the skin 3 (three) times daily before meals. Use as directed with sliding scale 0 - 59 notify md, 60 - 150 = 0, 151 - 199 = 2 units, 200 - 249 = 4 units, 250 - 299 = 6 units, 300 - 349 = 8 units, 350 - 399 = 10 units, 400 - 1000 notify md.)   Insulin Glargine (BASAGLAR KWIKPEN) 100 UNIT/ML Inject 18 units subcutaneously in the  morning and 25 units subcutaneously at bedtime   Insulin Pen Needle (GLOBAL EASE INJECT PEN NEEDLES) 32G X 4 MM MISC Use  As directed   Ipratropium-Albuterol (COMBIVENT RESPIMAT) 20-100 MCG/ACT AERS respimat Inhale 1 puff into the lungs every 6 (six) hours as needed for wheezing or shortness of breath. (Patient not taking: No sig reported)   isosorbide mononitrate (IMDUR) 30 MG 24 hr tablet TAKE 1 TABLET BY MOUTH DAILY   levothyroxine (SYNTHROID) 25 MCG tablet Take 1 tablet (25 mcg total) by mouth daily at 6 (six) AM.   loratadine (CLARITIN) 10 MG tablet TAKE 1 TABLET BY MOUTH DAILY (Patient taking differently: Take 10 mg by mouth daily.)   Melatonin 10 MG TABS Take 10 mg by mouth at bedtime.   metoprolol succinate (TOPROL-XL) 25 MG 24 hr tablet Take 2 tablets (50 mg total) by mouth daily.   Microlet Lancets MISC by Does not apply route 4 (four) times daily. DX e11.65   Multiple Vitamins-Minerals (MULTIVITAMIN WITH MINERALS) tablet Take 1 tablet by mouth daily.   naproxen (NAPROSYN) 500 MG tablet Take 500 mg by mouth 2 (two) times daily.   nitroGLYCERIN (NITROLINGUAL) 0.4 MG/SPRAY spray PLACE 1 SPRAY UNDER THE TONGUE EVERY 5 MINUTES FOR 3 DOSES AS NEEDED FOR CHEST PAIN.   nystatin (MYCOSTATIN/NYSTOP) powder Apply 1 application topically 3 (three) times daily.   omeprazole (PRILOSEC) 40 MG capsule TAKE 1 CAPSULE BY MOUTH DAILY (Patient taking differently: Take 40 mg by mouth daily.)   ondansetron (ZOFRAN) 4 MG tablet Take 8 mg by mouth 2 (two) times daily.   OXYGEN Inhale into the lungs. Inhale 4 liters into lungs continously   predniSONE (DELTASONE) 10 MG tablet Take by mouth.   Probiotic Product (BACID) CAPS Take 1 tablet by mouth daily.   sertraline (ZOLOFT) 25 MG tablet Take 25 mg by mouth daily.   simvastatin (ZOCOR) 10 MG tablet Take 5 mg by mouth daily.   simvastatin (ZOCOR) 80 MG tablet Take 80 mg by mouth daily.   tetrahydrozoline 0.05 % ophthalmic solution Place 1 drop into both eyes  daily. For minor eye irritation   traMADol (ULTRAM) 50 MG tablet Take 25 mg by mouth every 6 (six) hours as needed for moderate pain or severe pain.   traZODone (DESYREL) 50 MG tablet Take 50 mg by mouth at bedtime.  vancomycin (VANCOCIN) 1 g injection SMARTSIG:IV   VICTOZA 18 MG/3ML SOPN INJECT 1.8 MG EVERY MORNING (Patient taking differently: Inject 1.8 mg into the skin daily.)   No facility-administered encounter medications on file as of 03/26/2021.   Thank you for the opportunity to participate in the care of Ms. Dante.  The palliative care team will continue to follow. Please call our office at [phone] if we can be of additional assistance.   Questions and concerns were addressed. Provided general support and encouragement, no other unmet needs identified   This chart was dictated using voice recognition software.  Despite best efforts to proofread,  errors can occur which can change the documentation meaning.   Hally Colella Z Brytney Somes, NP ,

## 2021-03-27 ENCOUNTER — Non-Acute Institutional Stay: Payer: Medicare Other | Admitting: Nurse Practitioner

## 2021-03-27 ENCOUNTER — Encounter: Payer: Self-pay | Admitting: Nurse Practitioner

## 2021-03-27 ENCOUNTER — Other Ambulatory Visit: Payer: Self-pay

## 2021-03-27 DIAGNOSIS — R0602 Shortness of breath: Secondary | ICD-10-CM

## 2021-03-27 DIAGNOSIS — Z515 Encounter for palliative care: Secondary | ICD-10-CM

## 2021-03-27 DIAGNOSIS — I509 Heart failure, unspecified: Secondary | ICD-10-CM

## 2021-03-27 DIAGNOSIS — J449 Chronic obstructive pulmonary disease, unspecified: Secondary | ICD-10-CM

## 2021-03-27 NOTE — Progress Notes (Signed)
[facility] Consult Note Telephone: [phone]  Fax: [phone]    Date of encounter: 03/27/21 3:10 PM PATIENT NAME: [place] [id number]   [phone] (home)  DOB: 01-25-1956 MRN: [id number] PRIMARY CARE PROVIDER:    [facility]  RESPONSIBLE PARTY:    Contact Information     Name Relation Home Work Mobile   [place] Sister   [phone]      I met face to face with patient in /facility. Palliative Care was asked to follow this patient by consultation request of [place] to address advance care planning and complex medical decision making. This is a follow up visit.                                  ASSESSMENT AND PLAN / RECOMMENDATIONS:  Symptom Management/Plan: 1. Advance Care Planning; DNR; wishes are to stay at facility, minimize hospitalizations; We talked at length about Hospice benefit through [facility] program. Hospice Physicians felt Ms Gadsby is Hospice eligible. We talked about what services are provided, how the program works. We talked about medical goals including ckd with worsening kidney function with pending Nephrology appointment. We talked about dialysis, Ms. Wisser endorses she would not want to have dialysis and decided she did not wish to see Nephrology. Ms. Cotta endorses wishes are to stay comfortable in her room at [facility]. Ms. Rollyson wishes are quality of life. We talked about edema, symptoms shortness of breath, decompensation. Ms. Adderley in agreement with proceed with hospice. Emotional support provided. I updated Abran Cantor PA and received order for Hospice services at [facility]   2. Shortness of breath secondary to severe obesity, OSA,COPD/chronic respiratory failure on 4 L home O2, CHF, COPD, will continue to monitor respiratory, inhalation therapy. Discussed medical goals. Recommended Roxanol 53m/ml give 0.24mq6hrs as needed  for shortness of breath, pain. Recommended using torsemide rather than lasix, updated KrAbran CantorA   3. Palliative care encounter; Palliative care encounter; Palliative medicine team will continue to support patient, patient's family, and medical team. Visit consisted of counseling and education dealing with the complex and emotionally intense issues of symptom management and palliative care in the setting of serious and potentially life-threatening illness I spent 37 minutes providing this consultation. More than 50% of the time in this consultation was spent in counseling and care coordination. PPS: 40%  Chief Complaint: Follow up palliative consult for complex medical decision making  HISTORY OF PRESENT ILLNESS:  SaCHRYSTLE MURILLOs a [age].o. year old female  with multiple medical problems including morbid obesity-BMI of 6051type 2 diabetes mellitus, OSA,COPD/chronic respiratory failure on 4 L home O2, chronic venous insufficiency, hypothyroidism, chronic diastolic CHF, CAD, CKD, chronic osteoarthritis, depression who 12 months ago was transitioned from ALF to SNF due to chronic debility and falls. Last hospitalization 09/19/2020 for Right plantar diabetic foot ulcer with associated cellulitis and abscess, AKI on CKD, constipation, chronic venous insufficiency, uncontrolled DM, COPD with chronic respiratory failure on O2. [facility] able to sit on the side of the bed though it takes >20 minutes. Ms. LeGirtenoes takes a few steps with a walker then has to sit down due to increase in shortness of breath. Ms. LeAthaequires to be bathed, dressed due to increase in fatigue, shortness  of breath. Ms. Obi has a good appetite, feeds herself. Ms. Marcella is a DNR. Ms. Swarthout has chronic BLE edema with intermit cellulitis. I visited and observed Ms. Eber, she was sitting on the side of the bed with staff trying to change the bottom sheet. Ms Berti stood up for  <60 seconds leaning on walker with increase in shortness of breath, sat and improved symptoms with rest. Ms Devaux makes eye contact, interactive. We talked about ros, symptoms, reviewed Hospice benefit and Hospice physicians in agreement with eligibility. We talked about progression of CKD, CHF, see above, medical goals reviewed. We talked about quality of life, wishes to stay in her room at [facility], life her life there with focus on comfort. Therapeutic listening, emotional support provided. Questions answered.   History obtained from review of EMR, discussion with facility staff,  Ms. Madilyn, Cephas PA I reviewed available labs, medications, imaging, studies and related documents from the EMR.  Records reviewed and summarized above.   ROS 10 point system reviewed with Ms. Karim and staff, all negative except HPI  Physical Exam: Constitutional: NAD General: obese, pleasant, debilitated female EYES: lids intact ENMT:  oral mucous membranes moist CV: S1S2, RRR, +BLE edema Pulmonary: +decrease bases, few wheezing, + increased work of breathing, +chronic cough, O2 Abdomen: normo-active BS + 4 quadrants, soft and non tender MSK: few steps with walker Skin: warm and dry Neuro:  + generalized weakness,  no cognitive impairment Psych: non-anxious affect, A and O x 3 Thank you for the opportunity to participate in the care of Ms. Venturini.  The palliative care team will continue to follow. Please call our office at [phone] if we can be of additional assistance.   This chart was dictated using voice recognition software.  Despite best efforts to proofread,  errors can occur which can change the documentation meaning.   Questions and concerns were addressed.  Provided general support and encouragement, no other unmet needs identified   Jessen Siegman Ihor Gully, NP

## 2021-06-25 DEATH — deceased

## 2022-03-06 IMAGING — DX DG FOOT COMPLETE 3+V*R*
3 series · 3 of 3 positions shown · non-contrast
Comparison: Right foot radiograph dated 09/12/2020

CLINICAL DATA: 65-year-old female with right foot infection.

EXAM:
RIGHT FOOT COMPLETE - 3+ VIEW

[foot ap]
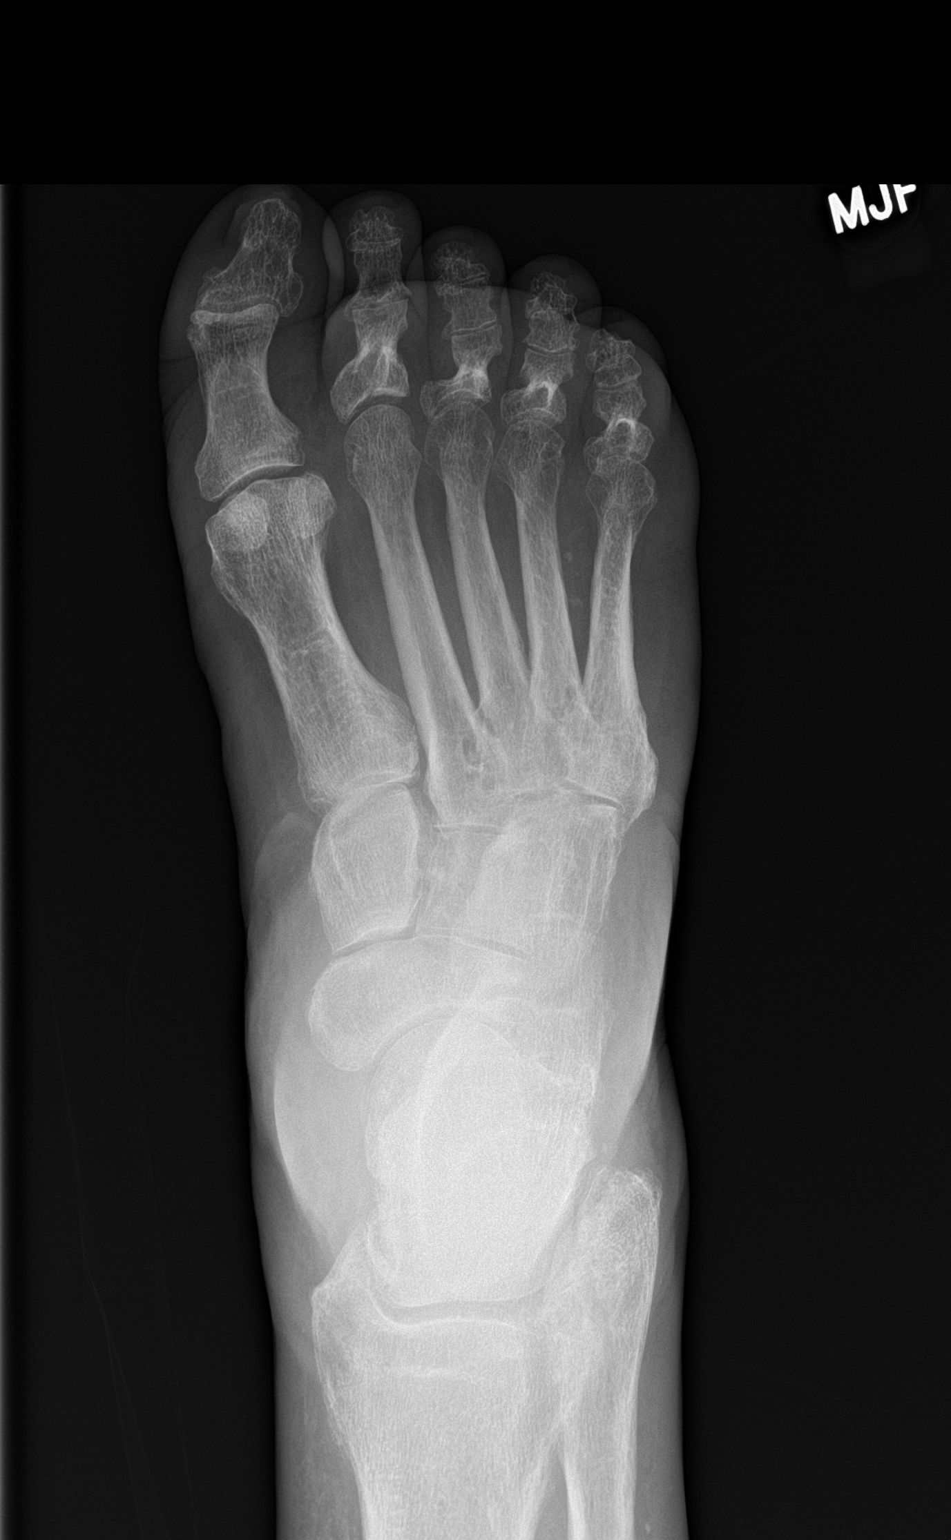

[foot obl]
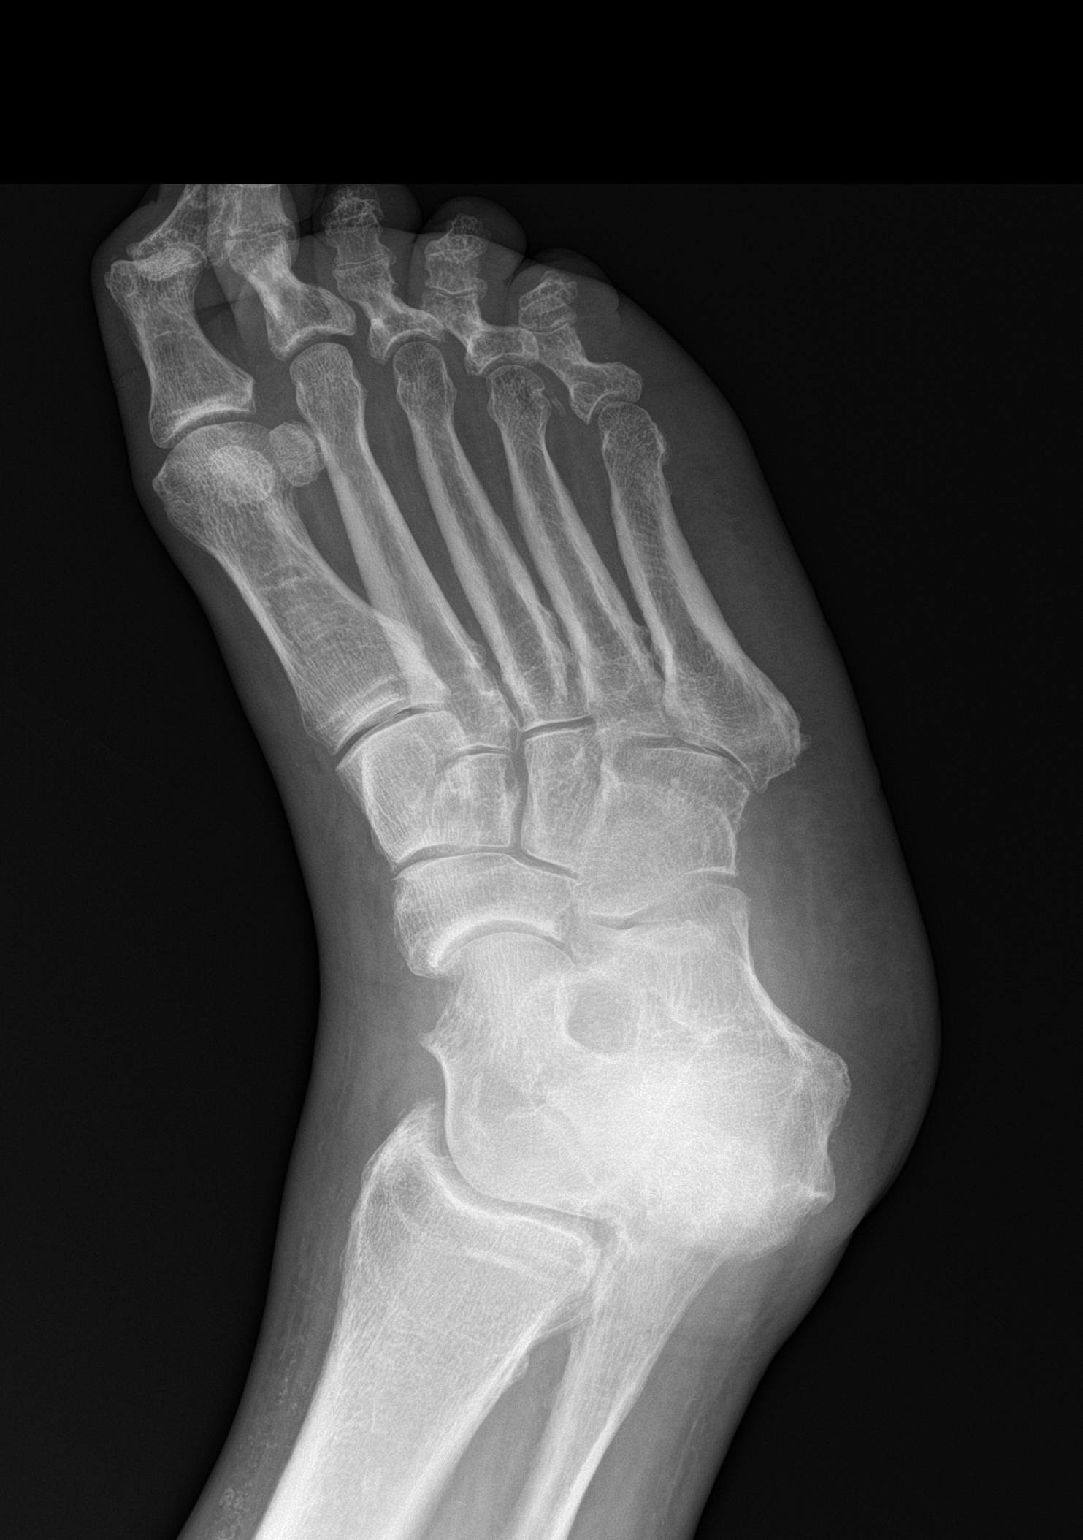

[foot lat]
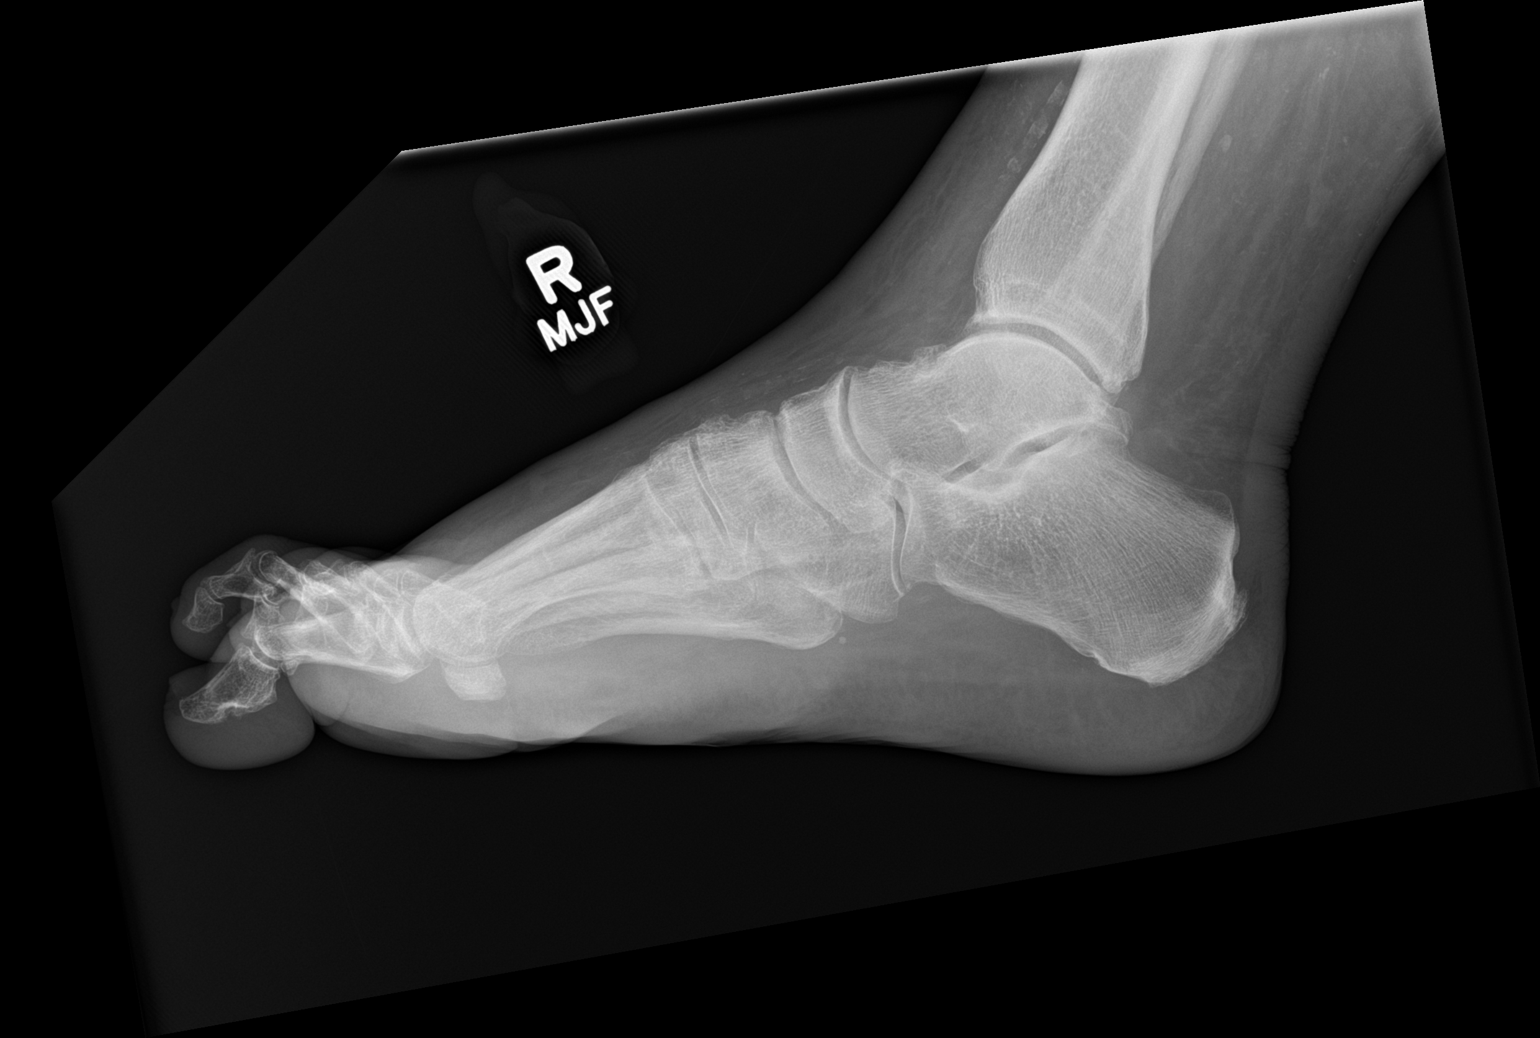

[3 of 3 positions shown; findings below may reference images not displayed]

FINDINGS: There is no acute fracture or dislocation. The bones are osteopenic.
No bone erosion or periosteal elevation. There is diffuse
subcutaneous edema. No radiopaque foreign object or soft tissue gas.
IMPRESSION: 1. No acute fracture or dislocation. No radiographic evidence of
acute osteomyelitis.
2. Diffuse subcutaneous edema.

## 2022-03-10 IMAGING — CR DG ABDOMEN 1V
1 series · 4 of 4 positions shown · non-contrast
Comparison: December 21, 2004

CLINICAL DATA: Constipation

EXAM:
ABDOMEN - 1 VIEW

[Series 1: dg abd 1 view · 0.14mm/px · 4 of 4 slices shown]
[im 1/4]
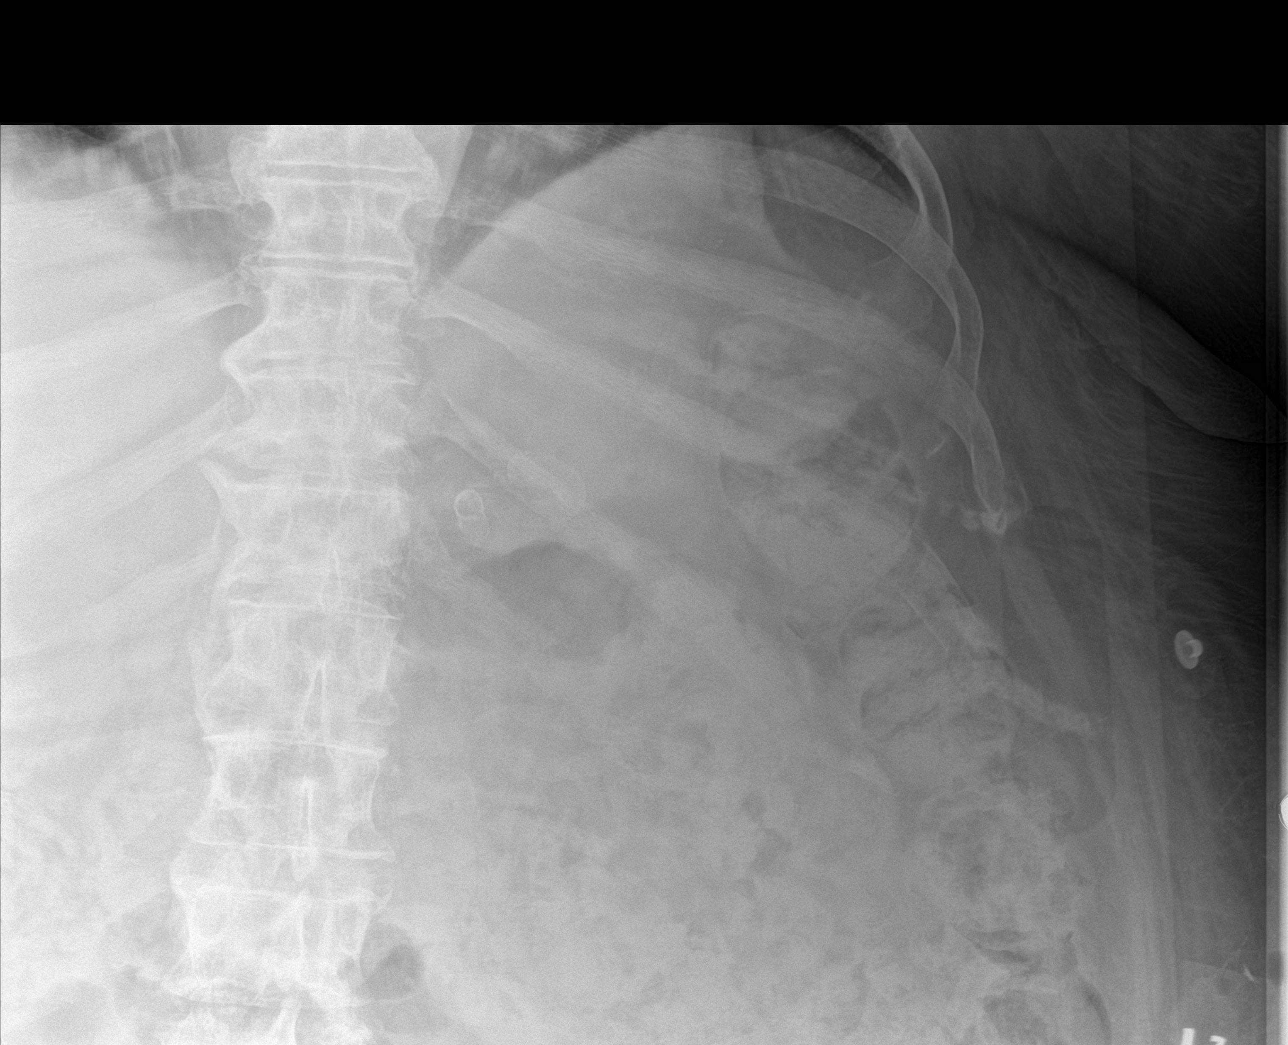
[im 2/4]
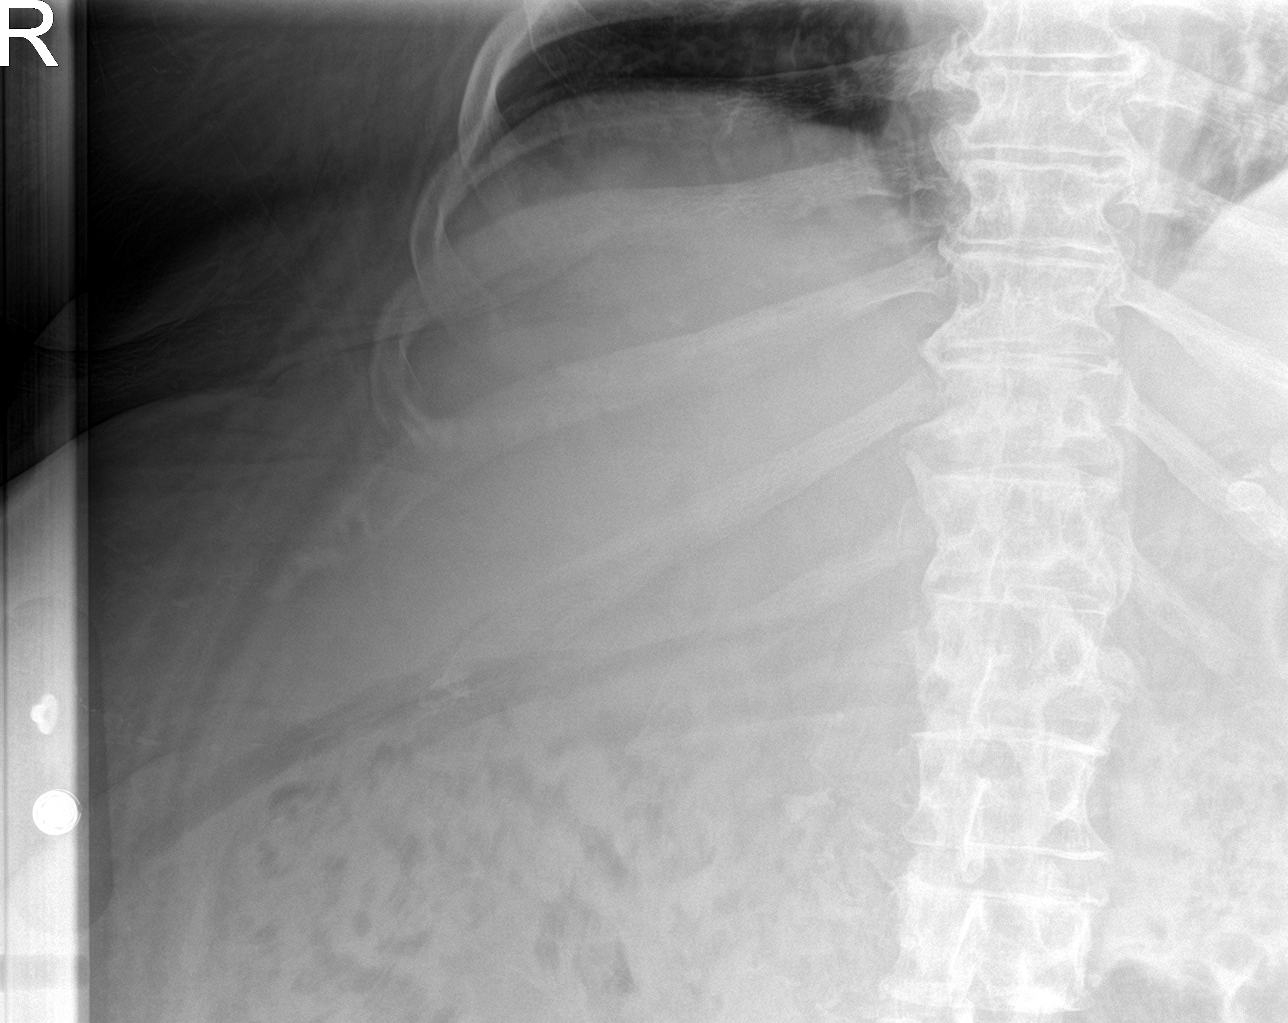
[im 3/4]
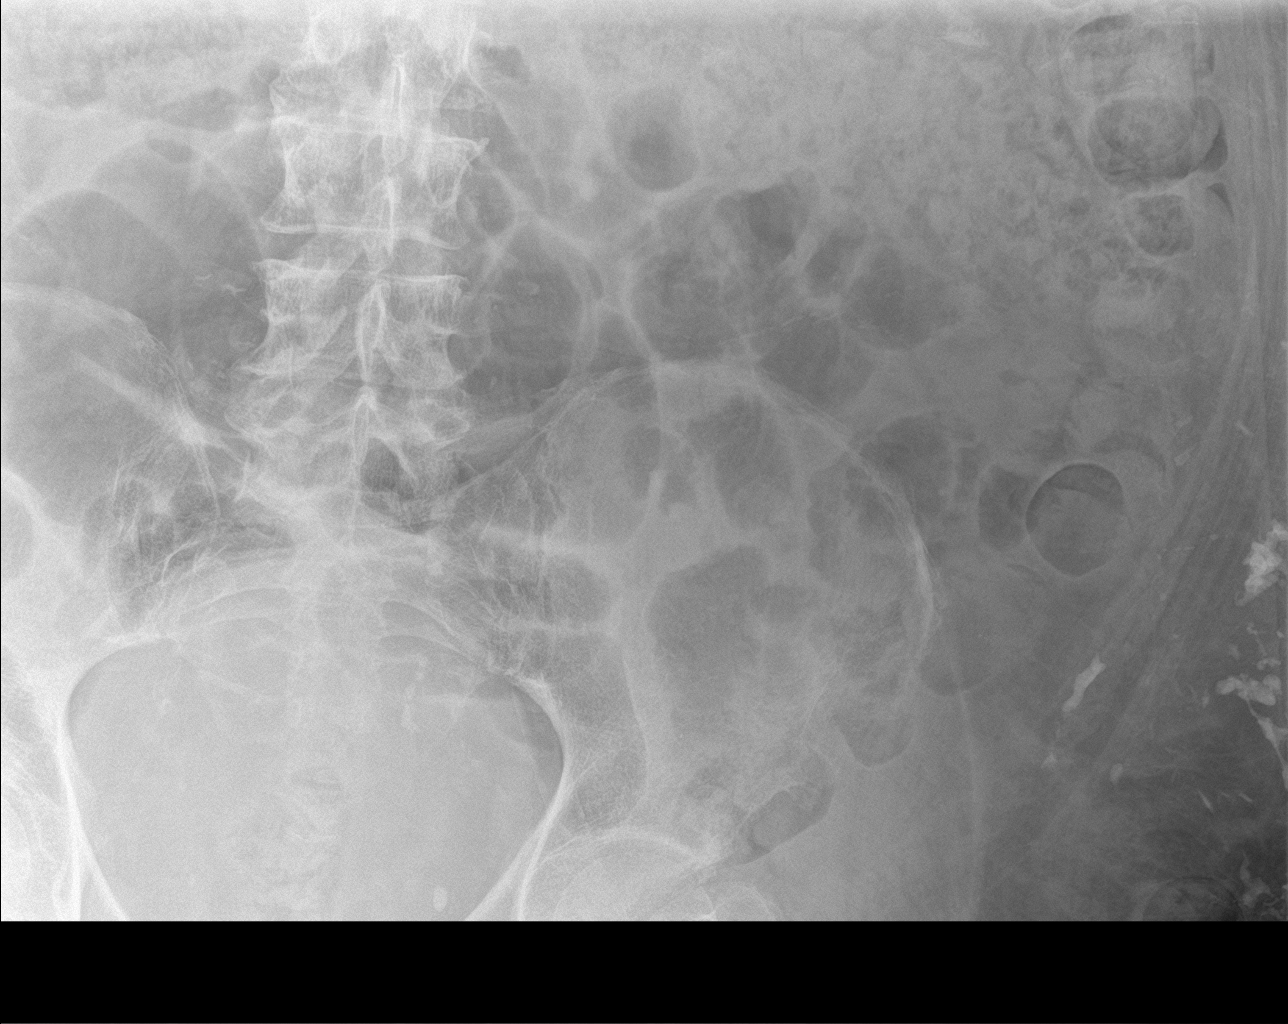
[im 4/4]
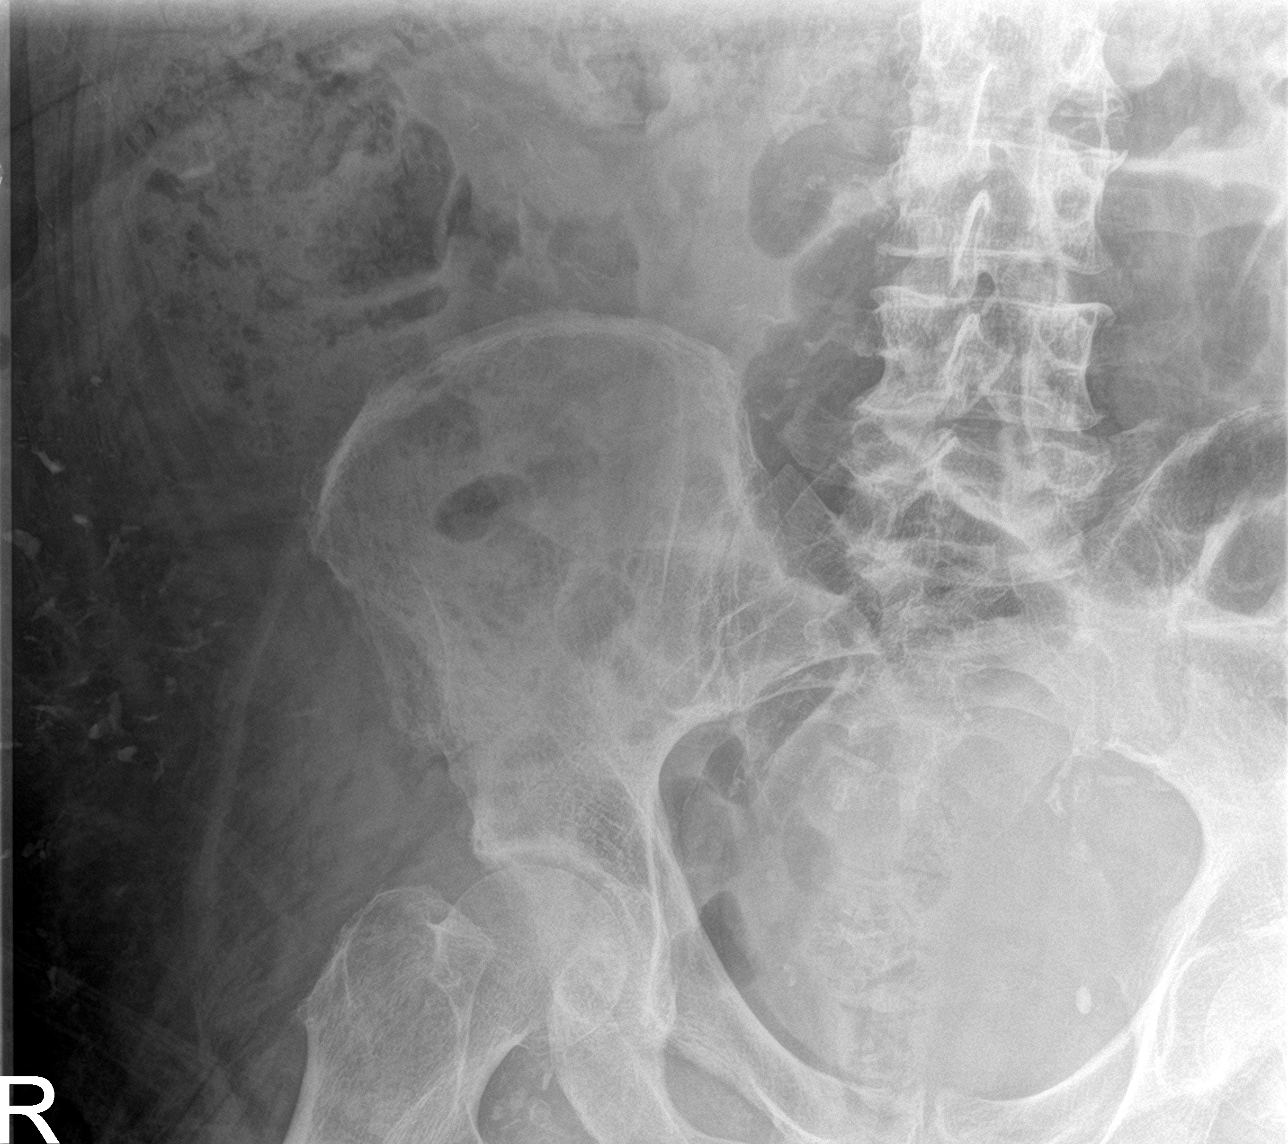

[4 of 4 positions shown; findings below may reference images not displayed]

FINDINGS: Lung bases are unremarkable. No free air, portal venous gas, or
pneumatosis identified. Moderate fecal loading throughout the colon.
Small bowel loops are nondilated. The sigmoid colon is mildly
prominent and air-filled.
IMPRESSION: 1. Moderate fecal loading throughout the colon.
2. Small bowel loops are normal in caliber.
3. The sigmoid colon is mildly prominent, likely due to an
air-filled mildly redundant sigmoid colon.
